# Patient Record
Sex: Male | Born: 1955
Health system: Southern US, Community
[De-identification: ages and names within clinical notes are randomized; demographics above are authoritative.]

## PROBLEM LIST (undated history)

## (undated) DIAGNOSIS — H5440 Blindness, one eye, unspecified eye: Secondary | ICD-10-CM

## (undated) DIAGNOSIS — I10 Essential (primary) hypertension: Secondary | ICD-10-CM

## (undated) DIAGNOSIS — E785 Hyperlipidemia, unspecified: Secondary | ICD-10-CM

## (undated) DIAGNOSIS — I341 Nonrheumatic mitral (valve) prolapse: Secondary | ICD-10-CM

## (undated) DIAGNOSIS — M1990 Unspecified osteoarthritis, unspecified site: Secondary | ICD-10-CM

## (undated) DIAGNOSIS — M545 Low back pain: Secondary | ICD-10-CM

## (undated) DIAGNOSIS — K635 Polyp of colon: Secondary | ICD-10-CM

## (undated) DIAGNOSIS — F419 Anxiety disorder, unspecified: Secondary | ICD-10-CM

## (undated) DIAGNOSIS — M8700 Idiopathic aseptic necrosis of unspecified bone: Secondary | ICD-10-CM

## (undated) HISTORY — DX: Anxiety disorder, unspecified: F41.9

## (undated) HISTORY — DX: Blindness, one eye, unspecified eye: H54.40

## (undated) HISTORY — PX: HERNIA REPAIR: SHX51

## (undated) HISTORY — DX: Polyp of colon: K63.5

## (undated) HISTORY — PX: SPINE SURGERY: SHX786

## (undated) HISTORY — DX: Low back pain: M54.5

## (undated) HISTORY — DX: Unspecified osteoarthritis, unspecified site: M19.90

---

## 1997-12-06 ENCOUNTER — Ambulatory Visit (HOSPITAL_COMMUNITY): Admission: RE | Admit: 1997-12-06 | Discharge: 1997-12-06 | Payer: Self-pay | Admitting: Specialist

## 1997-12-15 ENCOUNTER — Encounter: Admission: RE | Admit: 1997-12-15 | Discharge: 1998-03-15 | Payer: Self-pay | Admitting: Anesthesiology

## 1998-05-30 DIAGNOSIS — M545 Low back pain, unspecified: Secondary | ICD-10-CM

## 1998-05-30 HISTORY — DX: Low back pain, unspecified: M54.50

## 1998-09-14 ENCOUNTER — Ambulatory Visit (HOSPITAL_COMMUNITY): Admission: RE | Admit: 1998-09-14 | Discharge: 1998-09-14 | Payer: Self-pay | Admitting: Specialist

## 1998-09-14 ENCOUNTER — Encounter: Payer: Self-pay | Admitting: Specialist

## 1998-10-08 ENCOUNTER — Ambulatory Visit (HOSPITAL_COMMUNITY): Admission: RE | Admit: 1998-10-08 | Discharge: 1998-10-08 | Payer: Self-pay | Admitting: Orthopedic Surgery

## 1998-10-08 ENCOUNTER — Encounter: Payer: Self-pay | Admitting: Orthopedic Surgery

## 1998-11-05 ENCOUNTER — Observation Stay (HOSPITAL_COMMUNITY): Admission: RE | Admit: 1998-11-05 | Discharge: 1998-11-05 | Payer: Self-pay | Admitting: Specialist

## 1998-11-05 ENCOUNTER — Encounter: Payer: Self-pay | Admitting: Specialist

## 1999-11-12 ENCOUNTER — Encounter: Admission: RE | Admit: 1999-11-12 | Discharge: 1999-11-12 | Payer: Self-pay | Admitting: Internal Medicine

## 2000-01-11 ENCOUNTER — Encounter: Admission: RE | Admit: 2000-01-11 | Discharge: 2000-01-11 | Payer: Self-pay | Admitting: Internal Medicine

## 2000-01-14 ENCOUNTER — Encounter: Admission: RE | Admit: 2000-01-14 | Discharge: 2000-04-13 | Payer: Self-pay

## 2000-02-08 ENCOUNTER — Encounter: Admission: RE | Admit: 2000-02-08 | Discharge: 2000-02-08 | Payer: Self-pay | Admitting: Internal Medicine

## 2000-02-09 ENCOUNTER — Ambulatory Visit (HOSPITAL_COMMUNITY): Admission: RE | Admit: 2000-02-09 | Discharge: 2000-02-09 | Payer: Self-pay

## 2000-09-01 ENCOUNTER — Encounter: Admission: RE | Admit: 2000-09-01 | Discharge: 2000-09-01 | Payer: Self-pay | Admitting: Internal Medicine

## 2001-01-31 ENCOUNTER — Encounter: Admission: RE | Admit: 2001-01-31 | Discharge: 2001-01-31 | Payer: Self-pay | Admitting: Internal Medicine

## 2001-03-01 ENCOUNTER — Encounter: Admission: RE | Admit: 2001-03-01 | Discharge: 2001-03-01 | Payer: Self-pay | Admitting: Internal Medicine

## 2001-05-03 ENCOUNTER — Emergency Department (HOSPITAL_COMMUNITY): Admission: EM | Admit: 2001-05-03 | Discharge: 2001-05-03 | Payer: Self-pay | Admitting: Emergency Medicine

## 2001-05-03 ENCOUNTER — Encounter: Payer: Self-pay | Admitting: Emergency Medicine

## 2003-10-15 ENCOUNTER — Encounter: Admission: RE | Admit: 2003-10-15 | Discharge: 2003-10-15 | Payer: Self-pay | Admitting: Family Medicine

## 2003-12-16 ENCOUNTER — Ambulatory Visit (HOSPITAL_COMMUNITY): Admission: RE | Admit: 2003-12-16 | Discharge: 2003-12-16 | Payer: Self-pay | Admitting: Orthopedic Surgery

## 2004-04-19 ENCOUNTER — Emergency Department (HOSPITAL_COMMUNITY): Admission: EM | Admit: 2004-04-19 | Discharge: 2004-04-19 | Payer: Self-pay | Admitting: Emergency Medicine

## 2004-05-02 ENCOUNTER — Ambulatory Visit (HOSPITAL_COMMUNITY): Admission: RE | Admit: 2004-05-02 | Discharge: 2004-05-02 | Payer: Self-pay | Admitting: *Deleted

## 2004-09-06 ENCOUNTER — Ambulatory Visit: Payer: Self-pay | Admitting: Internal Medicine

## 2005-01-12 ENCOUNTER — Ambulatory Visit: Payer: Self-pay | Admitting: Internal Medicine

## 2005-01-15 ENCOUNTER — Encounter: Admission: RE | Admit: 2005-01-15 | Discharge: 2005-01-15 | Payer: Self-pay | Admitting: Internal Medicine

## 2005-04-13 ENCOUNTER — Ambulatory Visit: Payer: Self-pay | Admitting: Internal Medicine

## 2005-05-30 HISTORY — PX: EYE SURGERY: SHX253

## 2008-02-14 ENCOUNTER — Ambulatory Visit: Payer: Self-pay | Admitting: Internal Medicine

## 2008-02-14 DIAGNOSIS — M48061 Spinal stenosis, lumbar region without neurogenic claudication: Secondary | ICD-10-CM | POA: Insufficient documentation

## 2008-02-14 DIAGNOSIS — F17200 Nicotine dependence, unspecified, uncomplicated: Secondary | ICD-10-CM | POA: Insufficient documentation

## 2008-02-28 ENCOUNTER — Ambulatory Visit: Payer: Self-pay | Admitting: Internal Medicine

## 2008-02-28 DIAGNOSIS — L738 Other specified follicular disorders: Secondary | ICD-10-CM | POA: Insufficient documentation

## 2008-03-17 ENCOUNTER — Ambulatory Visit: Payer: Self-pay | Admitting: Internal Medicine

## 2009-10-28 ENCOUNTER — Ambulatory Visit: Payer: Self-pay | Admitting: Internal Medicine

## 2009-10-28 DIAGNOSIS — H00029 Hordeolum internum unspecified eye, unspecified eyelid: Secondary | ICD-10-CM | POA: Insufficient documentation

## 2010-05-05 ENCOUNTER — Ambulatory Visit: Payer: Self-pay | Admitting: Internal Medicine

## 2010-05-05 DIAGNOSIS — N401 Enlarged prostate with lower urinary tract symptoms: Secondary | ICD-10-CM

## 2010-05-05 DIAGNOSIS — N138 Other obstructive and reflux uropathy: Secondary | ICD-10-CM | POA: Insufficient documentation

## 2010-05-05 DIAGNOSIS — K4090 Unilateral inguinal hernia, without obstruction or gangrene, not specified as recurrent: Secondary | ICD-10-CM | POA: Insufficient documentation

## 2010-05-05 LAB — CONVERTED CEMR LAB
ALT: 19 units/L (ref 0–53)
AST: 18 units/L (ref 0–37)
Albumin: 4.6 g/dL (ref 3.5–5.2)
Alkaline Phosphatase: 63 units/L (ref 39–117)
BUN: 11 mg/dL (ref 6–23)
Basophils Absolute: 0 10*3/uL (ref 0.0–0.1)
Basophils Relative: 0.9 % (ref 0.0–3.0)
Bilirubin, Direct: 0.1 mg/dL (ref 0.0–0.3)
CO2: 29 meq/L (ref 19–32)
Calcium: 9.8 mg/dL (ref 8.4–10.5)
Chloride: 103 meq/L (ref 96–112)
Creatinine, Ser: 1.1 mg/dL (ref 0.4–1.5)
Eosinophils Absolute: 0 10*3/uL (ref 0.0–0.7)
Eosinophils Relative: 0.8 % (ref 0.0–5.0)
GFR calc non Af Amer: 89.77 mL/min (ref >60.00)
Glucose, Bld: 79 mg/dL (ref 70–99)
HCT: 46.4 % (ref 39.0–52.0)
Hemoglobin: 16 g/dL (ref 13.0–17.0)
Lymphocytes Relative: 44.5 % (ref 12.0–46.0)
Lymphs Abs: 2.1 10*3/uL (ref 0.7–4.0)
MCHC: 34.5 g/dL (ref 30.0–36.0)
MCV: 88.6 fL (ref 78.0–100.0)
Monocytes Absolute: 0.5 10*3/uL (ref 0.1–1.0)
Monocytes Relative: 10.6 % (ref 3.0–12.0)
Neutro Abs: 2 10*3/uL (ref 1.4–7.7)
Neutrophils Relative %: 43.2 % (ref 43.0–77.0)
PSA: 2.09 ng/mL (ref 0.10–4.00)
Platelets: 224 10*3/uL (ref 150.0–400.0)
Potassium: 4.1 meq/L (ref 3.5–5.1)
RBC: 5.24 M/uL (ref 4.22–5.81)
RDW: 15.1 % — ABNORMAL HIGH (ref 11.5–14.6)
Sodium: 140 meq/L (ref 135–145)
TSH: 0.85 microintl units/mL (ref 0.35–5.50)
Total Bilirubin: 0.8 mg/dL (ref 0.3–1.2)
Total Protein: 7.5 g/dL (ref 6.0–8.3)
WBC: 4.7 10*3/uL (ref 4.5–10.5)

## 2010-06-21 ENCOUNTER — Encounter: Payer: Self-pay | Admitting: Orthopedic Surgery

## 2010-06-29 ENCOUNTER — Encounter: Payer: Self-pay | Admitting: Internal Medicine

## 2010-06-29 NOTE — Assessment & Plan Note (Signed)
Summary: BACK PAIN--SOMETHING IN EYE--STC   Vital Signs:  Patient profile:   55 year old male Height:      72 inches Weight:      170 pounds BMI:     23.14 O2 Sat:      99 % on Room air Temp:     97.6 degrees F oral Pulse rate:   73 / minute Pulse rhythm:   regular Resp:     16 per minute BP sitting:   138 / 82  (left arm) Cuff size:   large  Vitals Entered By: Jonathan [name] CMA (October 28, 2009 9:00 AM)  O2 Flow:  Room air CC: back pain and cyst on L eye, Back Pain Is Patient Diabetic? No Pain Assessment Patient in pain? no        Primary Care Provider:  Etta Grandchild MD  CC:  back pain and cyst on L eye and Back Pain.  History of Present Illness: He returns c/o a painless nodule in left upper eyelid for 2 weeks.   Back Pain History:      The patient's back pain started approximately 11/13/1997.  The pain is located in the lower back region and does not radiate below the knees.  He states this is not work related.  On a scale of 1-10, he describes the pain as a 3.  He states that he has had a prior history of back pain.  The patient has not had any recent physical therapy for his back pain.  The following makes the back pain better: rest, muscle relaxers.  The following makes the back pain worse: bending.    Critical Exclusionary Diagnosis Criteria (CEDC) for Back Pain:      The patient denies a history of previous trauma.  He notes a prior history of spinal surgery.  There are no symptoms to suggest infection, cauda equina, or psychosocial factors for back pain.  Cancer risk factors include age >50 yrs with new back pain.     Preventive Screening-Counseling & Management  Alcohol-Tobacco     Alcohol drinks/day: 0     Smoking Status: current     Smoking Cessation Counseling: yes     Smoke Cessation Stage: precontemplative     Packs/Day: 0.5  Hep-HIV-STD-Contraception     Hepatitis Risk: no risk noted     HIV Risk: no risk noted     STD Risk: no risk noted     Sexual History:  currently monogamous.        Drug Use:  no.        Blood Transfusions:  no.    Medications Prior to Update: 1)  Flexeril 10 Mg Tabs (Cyclobenzaprine Hcl) .... Three Times A Day As Needed 2)  Tramadol Hcl 50 Mg Tabs (Tramadol Hcl) .... Three Times A Day As Needed 3)  Ibuprofen 800 Mg Tabs (Ibuprofen) .... One By Mouth Three Times A Day As Needed Lbp  Current Medications (verified): 1)  Tobradex 0.3-0.1 % Oint (Tobramycin-Dexamethasone) .... Instill A Thin Ribbon Under Left Upper Eyelid Three Times A Day For 10 Days 2)  Flexeril 10 Mg Tabs (Cyclobenzaprine Hcl) .... One By Mouth Three Times A Day As Needed For Low Back Pain  Allergies: 1)  ! Cortisone  Past History:  Past Medical History: Reviewed history from 02/14/2008 and no changes required. Low back pain  Past Surgical History: Reviewed history from 03/22/2007 and no changes required. back surgery--L4-5 disectomy  2000  Family History: Reviewed  history from 02/14/2008 and no changes required. Family History of Arthritis Family History Hypertension Family History of Stroke F 1st degree relative <60 Family History of Stroke M 1st degree relative <50  Social History: Reviewed history from 02/14/2008 and no changes required. Current Smoker Alcohol use-no Drug use-no Regular exercise-yes disabled Married Packs/Day:  0.5 Hepatitis Risk:  no risk noted HIV Risk:  no risk noted STD Risk:  no risk noted Sexual History:  currently monogamous Blood Transfusions:  no  Review of Systems General:  Denies chills, fatigue, fever, loss of appetite, malaise, and sweats. Eyes:  Complains of discharge; denies blurring, double vision, eye irritation, eye pain, halos, itching, light sensitivity, red eye, vision loss-1 eye, and vision loss-both eyes.  Physical Exam  General:  alert, well-developed, well-nourished, well-hydrated, appropriate dress, normal appearance, healthy-appearing, and cooperative to  examination.   Eyes:  he has an internal hordeolum in the left upper eyelid.  vision grossly intact, pupils equal, pupils round, pupils reactive to light, pupils react to accomodation, corneas and lenses clear, no injection, and no nystagmus.   Ears:  R ear normal and L ear normal.   Mouth:  Oral mucosa and oropharynx without lesions or exudates.  Teeth in good repair. Neck:  supple, full ROM, and no masses.   Chest Wall:  he has a lipoma on the right posterior chest wall Lungs:  Normal respiratory effort, chest expands symmetrically. Lungs are clear to auscultation, no crackles or wheezes. Heart:  Normal rate and regular rhythm. S1 and S2 normal without gallop, murmur, click, rub or other extra sounds. Abdomen:  Bowel sounds positive,abdomen soft and non-tender without masses, organomegaly or hernias noted. Msk:  normal ROM, no joint tenderness, no joint swelling, no joint warmth, no redness over joints, no joint deformities, no joint instability, no crepitation, and no muscle atrophy.   Pulses:  R and L carotid,radial,femoral,dorsalis pedis and posterior tibial pulses are full and equal bilaterally Extremities:  No clubbing, cyanosis, edema, or deformity noted with normal full range of motion of all joints.   Neurologic:  No cranial nerve deficits noted. Station and gait are normal. Plantar reflexes are down-going bilaterally. DTRs are symmetrical throughout. Sensory, motor and coordinative functions appear intact. Skin:  turgor normal, color normal, no rashes, no suspicious lesions, no ecchymoses, no petechiae, no purpura, no ulcerations, and no edema.   Cervical Nodes:  no anterior cervical adenopathy and no posterior cervical adenopathy.   Axillary Nodes:  no R axillary adenopathy and no L axillary adenopathy.   Inguinal Nodes:  no R inguinal adenopathy and no L inguinal adenopathy.   Psych:  Cognition and judgment appear intact. Alert and cooperative with normal attention span and  concentration. No apparent delusions, illusions, hallucinations  Low Back Pain Physical Exam:    Motor Exam/Strength:         Left Ankle Dorsiflexion (L5,L4):     normal       Left Great Toe Dorsiflexion (L5,L4):     normal       Left Heel Walk (L5,some L4):     normal       Left Single Squat & Rise-Quads (L4):   normal       Left Toe Walk-calf (S1):       normal       Right Ankle Dorsiflexion (L5,L4):     normal       Right Great Toe Dorsiflexion (L5,L4):       normal  Right Heel Walk (L5,some L4):     normal       Right Single Squat & Rise Quads (L4):   normal       Right Toe Walk-calf (S1):       normal    Sensory Exam/Pinprick:        Left Medial Foot (L4):   normal       Left Dorsal Foot (L5):   normal       Left Lateral Foot (S1):   normal       Right Medial Foot (L4):   normal       Right Dorsal Foot (L5):   normal       Right Lateral Foot (S1):   normal    Reflexes:        Left Knee Jerk (L4):     normal       Left Ankle Reflex (S1):   normal       Right Knee Jerk:     normal       Right Ankle Reflex (S1):   normal    Straight Leg Raise (SLR):       Left Straight Leg Raise (SLR):   negative       Right Straight Leg Raise (SLR):   negative   Impression & Recommendations:  Problem # 1:  HORDEOLUM INTERNUM (ICD-373.12) Assessment New will try warm compresses and tobradex ophth ointment for 10 days and consider Ophth. referral if I and D is needed Orders: Ophthalmology Referral (Ophthalmology)  Problem # 2:  LOW BACK PAIN (ICD-724.2) Assessment: Unchanged  The following medications were removed from the medication list:    Flexeril 10 Mg Tabs (Cyclobenzaprine hcl) .Marland Kitchen... Three times a day as needed    Tramadol Hcl 50 Mg Tabs (Tramadol hcl) .Marland Kitchen... Three times a day as needed    Ibuprofen 800 Mg Tabs (Ibuprofen) ..... One by mouth three times a day as needed lbp His updated medication list for this problem includes:    Flexeril 10 Mg Tabs (Cyclobenzaprine hcl)  ..... One by mouth three times a day as needed for low back pain  Discussed use of moist heat or ice, modified activities, medications, and stretching/strengthening exercises. Back care instructions given. To be seen in 2 weeks if no improvement; sooner if worsening of symptoms.   Orders: Tobacco use cessation intermediate 3-10 minutes (04540)  Problem # 3:  TOBACCO ABUSE (ICD-305.1) Assessment: Unchanged  Encouraged smoking cessation and discussed different methods for smoking cessation.   Orders: Tobacco use cessation intermediate 3-10 minutes (99406)  Complete Medication List: 1)  Tobradex 0.3-0.1 % Oint (Tobramycin-dexamethasone) .... Instill a thin ribbon under left upper eyelid three times a day for 10 days 2)  Flexeril 10 Mg Tabs (Cyclobenzaprine hcl) .... One by mouth three times a day as needed for low back pain  Patient Instructions: 1)  Please schedule a follow-up appointment in 2 weeks. 2)  Tobacco is very bad for your health and your loved ones! You Should stop smoking!. 3)  Stop Smoking Tips: Choose a Quit date. Cut down before the Quit date. decide what you will do as a substitute when you feel the urge to smoke(gum,toothpick,exercise). 4)  Take 650-1000mg  of Tylenol every 4-6 hours as needed for relief of pain or comfort of fever AVOID taking more than 4000mg   in a 24 hour period (can cause liver damage in higher doses). 5)  Take 400-600mg  of Ibuprofen (Advil, Motrin) with food every 4-6 hours as  needed for relief of pain or comfort of fever. 6)  Most patients (90%) with low back pain will improve with time (2-6 weeks). Keep active but avoid activities that are painful. Apply moist heat and/or ice to lower back several times a day. Prescriptions: FLEXERIL 10 MG TABS (CYCLOBENZAPRINE HCL) One by mouth three times a day as needed for low back pain  #50 x 11   Entered and Authorized by:   Jonathan Grandchild MD   Signed by:   Jonathan Grandchild MD on 10/28/2009   Method used:    Print then Give to Patient   RxID:   [id number] TOBRADEX 0.3-0.1 % OINT (TOBRAMYCIN-DEXAMETHASONE) Instill a thin ribbon under left upper eyelid three times a day for 10 days  #1 tube x 0   Entered and Authorized by:   Jonathan Grandchild MD   Signed by:   Jonathan Grandchild MD on 10/28/2009   Method used:   Print then Give to Patient   RxID:   [id number]   Preventive Care Screening  Last Tetanus Booster:    Date:  05/31/2007    Results:  Historical     Not Administered:    Influenza Vaccine not given due to: declined

## 2010-06-29 NOTE — Assessment & Plan Note (Signed)
Summary: 2 wk fu/$50/pn   Vital Signs:  Patient Profile:   55 Years Old Male Weight:      164 pounds O2 Sat:      98 % Temp:     98.2 degrees F oral Pulse rate:   60 / minute Pulse rhythm:   regular BP sitting:   120 / 82  (left arm) Cuff size:   regular  Vitals Entered By: Rock Nephew CMA (March 17, 2008 11:10 AM)                 Chief Complaint:  2wk follow up/pain med not working.  History of Present Illness: Pt. c/o LBP for one year with some increase in pain over the last few months. No recent trauma or injury. He had normal plain films done at a hospital in [facility]. within the last year. He had discectomy done by Dr. Montez Morita at [facility] in 2000. The pain radiates into both legs. Currrent meds are not eliminating the pain. He doesn't take ibuprofen b/c otc doses are too expensive.  infected follicle on left chest has healed. there is no more pain, swelling,  or drainage.  Back Pain History:      The patient's back pain started approximately 03/08/2007.  The pain is located in the lower back region and does not radiate below the knees.  He states this is work related.  He states that he has had a prior history of back pain.  The patient has not had any recent physical therapy for his back pain.  The following makes the back pain better: activity.  The following makes the back pain worse: sitting.    Critical Exclusionary Diagnosis Criteria (CEDC) for Back Pain:      There are no symptoms to suggest infection, cauda equina, or psychosocial factors for back pain.  Cancer risk factors include no improvement in low back pain after 4-6 weeks therapy.  Other positive CEDC factors include "failed back syndrome".       Prior Medication List:  FLEXERIL 10 MG TABS (CYCLOBENZAPRINE HCL) three times a day as needed TRAMADOL HCL 50 MG TABS (TRAMADOL HCL) three times a day as needed DOXYCYCLINE HYCLATE 100 MG CPEP (DOXYCYCLINE HYCLATE) by mouth two times a day for 7  days   Updated Prior Medication List: FLEXERIL 10 MG TABS (CYCLOBENZAPRINE HCL) three times a day as needed TRAMADOL HCL 50 MG TABS (TRAMADOL HCL) three times a day as needed  Current Allergies (reviewed today): ! * ? STEROID  Past Medical History:    Reviewed history from 02/14/2008 and no changes required:       Low back pain  Past Surgical History:    Reviewed history from 03/22/2007 and no changes required:       back surgery--L4-5 disectomy  2000   Family History:    Reviewed history from 02/14/2008 and no changes required:       Family History of Arthritis       Family History Hypertension       Family History of Stroke F 1st degree relative <60       Family History of Stroke M 1st degree relative <50  Social History:    Reviewed history from 02/14/2008 and no changes required:       Divorced       Current Smoker       Alcohol use-yes       Drug use-no       Regular exercise-yes  disabled   Risk Factors:     Counseled to quit/cut down tobacco use:  yes Passive smoke exposure:  no HIV high-risk behavior:  no Alcohol use:  no  Family History Risk Factors:    Family History of MI in females < 40 years old:  no    Family History of MI in males < 73 years old:  no   Review of Systems       The patient complains of weight gain.  The patient denies anorexia, fever, weight loss, chest pain, syncope, dyspnea on exertion, peripheral edema, prolonged cough, headaches, hemoptysis, abdominal pain, melena, hematochezia, severe indigestion/heartburn, hematuria, incontinence, difficulty walking, depression, and testicular masses.    MS      Complains of low back pain and stiffness.      Denies joint pain, joint redness, joint swelling, loss of strength, mid back pain, muscle aches, muscle, cramps, muscle weakness, and thoracic pain.  Neuro      Complains of numbness.      Denies brief paralysis, difficulty with concentration, disturbances in coordination, falling  down, headaches, inability to speak, memory loss, poor balance, seizures, sensation of room spinning, tingling, tremors, visual disturbances, and weakness.   Physical Exam  General:     alert, well-developed, well-nourished, well-hydrated, and underweight appearing.   Neck:     supple, full ROM, no masses, no thyromegaly, no thyroid nodules or tenderness, and no JVD.   Chest Wall:     the area of previous folliculitis has healed with no evidence of infetion. The only noticeable finding now is a very small area of PIPA. Lungs:     Normal respiratory effort, chest expands symmetrically. Lungs are clear to auscultation, no crackles or wheezes. Heart:     Normal rate and regular rhythm. S1 and S2 normal without gallop, murmur, click, rub or other extra sounds. Abdomen:     soft, non-tender, normal bowel sounds, no hepatomegaly, and no splenomegaly.   Msk:     normal ROM, no joint tenderness, and no joint swelling.   Neurologic:     alert & oriented X3, cranial nerves II-XII intact, strength normal in all extremities, sensation intact to light touch, gait normal, and LLE hyporeflexia.   Skin:     turgor normal, color normal, and no rashes.   Psych:     Oriented X3, memory intact for recent and remote, normally interactive, good eye contact, not anxious appearing, not depressed appearing, and not agitated.    Low Back Pain Physical Exam:    Inspection-deformity:     No    Palpation-spinal tenderness:   No    Motor Exam/Strength:         Left Ankle Dorsiflexion (L5,L4):     normal       Left Great Toe Dorsiflexion (L5,L4):     normal       Left Heel Walk (L5,some L4):     normal       Left Single Squat & Rise-Quads (L4):   normal       Left Toe Walk-calf (S1):       normal       Right Ankle Dorsiflexion (L5,L4):     normal       Right Great Toe Dorsiflexion (L5,L4):       normal       Right Heel Walk (L5,some L4):     normal       Right Single Squat & Rise Quads (L4):   normal  Right Toe Walk-calf (S1):       normal    Sensory Exam/Pinprick:        Left Medial Foot (L4):   normal       Left Dorsal Foot (L5):   normal       Left Lateral Foot (S1):   normal       Right Medial Foot (L4):   normal       Right Dorsal Foot (L5):   normal       Right Lateral Foot (S1):   normal    Reflexes:        Left Knee Jerk (L4):     decreased       Left Ankle Reflex (S1):   decreased       Right Knee Jerk:     normal       Right Ankle Reflex (S1):   normal    Straight Leg Raise (SLR):       Left Straight Leg Raise (SLR):   negative       Right Straight Leg Raise (SLR):   negative   Detailed Back/Spine Exam  Lumbosacral Exam:  Inspection-deformity:    Abnormal    positive for scar Palpation-spinal tenderness:  Normal Range of Motion:    Forward Flexion:   90 degrees    Hyperextension:   35 degrees    Right Lateral Bend:   35 degrees    Left Lateral Bend:   30 degrees Lying Straight Leg Raise:    Right:  negative    Left:  negative Sitting Straight Leg Raise:    Right:  negative    Left:  negative Reverse Straight Leg Raise:    Right:  negative    Left:  negative Contralateral Straight Leg Raise:    Right:  negative    Left:  negative Sciatic Notch:    There is no sciatic notch tenderness. Toe Walking:    Right:  normal    Left:  normal Heel Walking:    Right:  normal    Left:  normal    Impression & Recommendations:  Problem # 1:  LOW BACK PAIN (ICD-724.2) Assessment: Deteriorated ortho referral to dr. Shon Baton at [facility]. His updated medication list for this problem includes:    Flexeril 10 Mg Tabs (Cyclobenzaprine hcl) .Marland Kitchen... Three times a day as needed    Tramadol Hcl 50 Mg Tabs (Tramadol hcl) .Marland Kitchen... Three times a day as needed    Ibuprofen 800 Mg Tabs (Ibuprofen) ..... One by mouth three times a day as needed lbp   Problem # 2:  FOLLICULITIS (ICD-704.8) Assessment: Improved  Complete Medication List: 1)  Flexeril 10 Mg Tabs  (Cyclobenzaprine hcl) .... Three times a day as needed 2)  Tramadol Hcl 50 Mg Tabs (Tramadol hcl) .... Three times a day as needed 3)  Ibuprofen 800 Mg Tabs (Ibuprofen) .... One by mouth three times a day as needed lbp   Patient Instructions: 1)  Please schedule an appointment with Dr. Shon Baton at [facility] [phone] 2)  Please schedule a follow-up appointment in 1 month. 3)  Tobacco is very bad for your health and your loved ones! You Should stop smoking!. 4)  Stop Smoking Tips: Choose a Quit date. Cut down before the Quit date. decide what you will do as a substitute when you feel the urge to smoke(gum,toothpick,exercise). 5)  It is important that you exercise regularly at least 20 minutes 5 times a week. If you develop  chest pain, have severe difficulty breathing, or feel very tired , stop exercising immediately and seek medical attention.   Prescriptions: IBUPROFEN 800 MG TABS (IBUPROFEN) one by mouth three times a day as needed LBP  #90 x 2   Entered and Authorized by:   Etta Grandchild MD   Signed by:   Etta Grandchild MD on 03/17/2008   Method used:   Print then Give to Patient   RxID:   [id number]  ]  Appended Document: 2 wk fu/$50/pn As billing provider, I have reviewed this document.

## 2010-06-29 NOTE — Assessment & Plan Note (Signed)
Summary: f/u and ?hernia/lb   Vital Signs:  Patient profile:   55 year old male Height:      72 inches Weight:      170 pounds BMI:     23.14 O2 Sat:      97 % on Room air Temp:     97.9 degrees F oral Pulse rate:   56 / minute Pulse rhythm:   regular Resp:     16 per minute BP sitting:   134 / 72  (left arm) Cuff size:   large  Vitals Entered By: Rock Nephew CMA (May 05, 2010 1:57 PM)  O2 Flow:  Room air CC: follow-up visit// pt c/o continued back pain and knot on hernia Is Patient Diabetic? No Pain Assessment Patient in pain? yes     Location: back  Does patient need assistance? Functional Status Self care Ambulation Normal   Primary Care Provider:  Etta Grandchild MD  CC:  follow-up visit// pt c/o continued back pain and knot on hernia.  History of Present Illness: He returns c/o a bulge in his right groin for several months.  His low back pain has not worsened or changed and he tells me that he is seeing Dr. Shon Baton for intermittent ESI and therapy.  Preventive Screening-Counseling & Management  Alcohol-Tobacco     Alcohol drinks/day: 0     Alcohol Counseling: not indicated; patient does not drink     Smoking Status: current     Smoking Cessation Counseling: yes     Smoke Cessation Stage: precontemplative     Packs/Day: 0.5     Passive Smoke Exposure: no     Tobacco Counseling: to quit use of tobacco products  Hep-HIV-STD-Contraception     Hepatitis Risk: no risk noted     HIV Risk: no risk noted     STD Risk: no risk noted      Sexual History:  currently monogamous.        Drug Use:  no.        Blood Transfusions:  no.    Medications Prior to Update: 1)  Tobradex 0.3-0.1 % Oint (Tobramycin-Dexamethasone) .... Instill A Thin Ribbon Under Left Upper Eyelid Three Times A Day For 10 Days 2)  Flexeril 10 Mg Tabs (Cyclobenzaprine Hcl) .... One By Mouth Three Times A Day As Needed For Low Back Pain  Current Medications (verified): 1)   None  Allergies (verified): 1)  ! Cortisone  Past History:  Past Medical History: Last updated: 02/14/2008 Low back pain  Past Surgical History: Last updated: 03/22/2007 back surgery--L4-5 disectomy  2000  Family History: Last updated: 02/14/2008 Family History of Arthritis Family History Hypertension Family History of Stroke F 1st degree relative <60 Family History of Stroke M 1st degree relative <50  Social History: Last updated: 10/28/2009 Current Smoker Alcohol use-no Drug use-no Regular exercise-yes disabled Married  Risk Factors: Alcohol Use: 0 (05/05/2010) Exercise: yes (02/14/2008)  Risk Factors: Smoking Status: current (05/05/2010) Packs/Day: 0.5 (05/05/2010) Passive Smoke Exposure: no (05/05/2010)  Family History: Reviewed history from 02/14/2008 and no changes required. Family History of Arthritis Family History Hypertension Family History of Stroke F 1st degree relative <60 Family History of Stroke M 1st degree relative <50  Social History: Reviewed history from 10/28/2009 and no changes required. Current Smoker Alcohol use-no Drug use-no Regular exercise-yes disabled Married  Review of Systems  The patient denies anorexia, fever, weight loss, weight gain, chest pain, syncope, dyspnea on exertion, peripheral edema, prolonged cough,  headaches, hemoptysis, abdominal pain, melena, hematochezia, severe indigestion/heartburn, hematuria, genital sores, suspicious skin lesions, enlarged lymph nodes, angioedema, and testicular masses.   GU:  Denies decreased libido, discharge, dysuria, erectile dysfunction, genital sores, hematuria, incontinence, nocturia, urinary frequency, and urinary hesitancy. MS:  Complains of low back pain; denies joint pain, joint redness, joint swelling, loss of strength, and muscle aches. Neuro:  Denies disturbances in coordination, falling down, numbness, poor balance, tingling, and weakness.  Physical Exam  General:   alert, well-developed, well-nourished, well-hydrated, appropriate dress, normal appearance, healthy-appearing, and cooperative to examination.   Head:  normocephalic, atraumatic, no abnormalities observed, and no abnormalities palpated.   Eyes:  No corneal or conjunctival inflammation noted. EOMI. Perrla. Funduscopic exam benign, without hemorrhages, exudates or papilledema. Vision grossly normal. Mouth:  Oral mucosa and oropharynx without lesions or exudates.  Teeth in good repair. Neck:  supple, full ROM, and no masses.   Lungs:  Normal respiratory effort, chest expands symmetrically. Lungs are clear to auscultation, no crackles or wheezes. Heart:  Normal rate and regular rhythm. S1 and S2 normal without gallop, murmur, click, rub or other extra sounds. Abdomen:  soft, non-tender, normal bowel sounds, no distention, no masses, no guarding, no rigidity, no rebound tenderness, no abdominal hernia, no hepatomegaly, and no splenomegaly.  he has a right direct inguinal hernia that protrudes with valsalva then spontaneously reduces. Rectal:  No external abnormalities noted. Normal sphincter tone. No rectal masses or tenderness. Genitalia:  uncircumcised, no hydrocele, no varicocele, no scrotal masses, no testicular masses or atrophy, no cutaneous lesions, and no urethral discharge.   Prostate:  no nodules, no asymmetry, no induration, and 1+ enlarged.   Msk:  No deformity or scoliosis noted of thoracic or lumbar spine.   Pulses:  R and L carotid,radial,femoral,dorsalis pedis and posterior tibial pulses are full and equal bilaterally Extremities:  No clubbing, cyanosis, edema, or deformity noted with normal full range of motion of all joints.   Neurologic:  No cranial nerve deficits noted. Station and gait are normal. Plantar reflexes are down-going bilaterally. DTRs are symmetrical throughout. Sensory, motor and coordinative functions appear intact. Skin:  Intact without suspicious lesions or  rashes Cervical Nodes:  no anterior cervical adenopathy and no posterior cervical adenopathy.   Axillary Nodes:  no R axillary adenopathy and no L axillary adenopathy.   Inguinal Nodes:  no R inguinal adenopathy and no L inguinal adenopathy.   Psych:  Cognition and judgment appear intact. Alert and cooperative with normal attention span and concentration. No apparent delusions, illusions, hallucinations   Impression & Recommendations:  Problem # 1:  ING HERN W/O MENTION OBST/GANGREN UNILAT/UNSPEC (ICD-550.90) Assessment New  Orders: Venipuncture (09381) TLB-BMP (Basic Metabolic Panel-BMET) (80048-METABOL) TLB-CBC Platelet - w/Differential (85025-CBCD) TLB-Hepatic/Liver Function Pnl (80076-HEPATIC) TLB-TSH (Thyroid Stimulating Hormone) (84443-TSH) TLB-PSA (Prostate Specific Antigen) (84153-PSA) Surgical Referral (Surgery)  Problem # 2:  HYPERTROPHY PROSTATE W/UR OBST & OTH LUTS (ICD-600.01) Assessment: New  Orders: Venipuncture (82993) TLB-BMP (Basic Metabolic Panel-BMET) (80048-METABOL) TLB-CBC Platelet - w/Differential (85025-CBCD) TLB-Hepatic/Liver Function Pnl (80076-HEPATIC) TLB-TSH (Thyroid Stimulating Hormone) (84443-TSH) TLB-PSA (Prostate Specific Antigen) (84153-PSA)  Problem # 3:  LOW BACK PAIN (ICD-724.2) Assessment: Unchanged  The following medications were removed from the medication list:    Flexeril 10 Mg Tabs (Cyclobenzaprine hcl) ..... One by mouth three times a day as needed for low back pain  Orders: Venipuncture (71696) TLB-BMP (Basic Metabolic Panel-BMET) (80048-METABOL) TLB-CBC Platelet - w/Differential (85025-CBCD) TLB-Hepatic/Liver Function Pnl (80076-HEPATIC) TLB-TSH (Thyroid Stimulating Hormone) (84443-TSH) TLB-PSA (Prostate Specific Antigen) (  84153-PSA)  Colorectal Screening:  Current Recommendations:    Hemoccult: NEG X 1 today    Colonoscopy recommended: patient defers today but will consider in the future  PSA Screening:    Reviewed  PSA screening recommendations: PSA ordered  Immunization & Chemoprophylaxis:    Tetanus vaccine: Historical  (05/31/2007)  Patient Instructions: 1)  Please schedule a follow-up appointment in 1 month. 2)  Tobacco is very bad for your health and your loved ones! You Should stop smoking!. 3)  Stop Smoking Tips: Choose a Quit date. Cut down before the Quit date. decide what you will do as a substitute when you feel the urge to smoke(gum,toothpick,exercise). 4)  Take 650-1000mg  of Tylenol every 4-6 hours as needed for relief of pain or comfort of fever AVOID taking more than 4000mg   in a 24 hour period (can cause liver damage in higher doses).   Orders Added: 1)  Venipuncture [36415] 2)  TLB-BMP (Basic Metabolic Panel-BMET) [80048-METABOL] 3)  TLB-CBC Platelet - w/Differential [85025-CBCD] 4)  TLB-Hepatic/Liver Function Pnl [80076-HEPATIC] 5)  TLB-TSH (Thyroid Stimulating Hormone) [84443-TSH] 6)  TLB-PSA (Prostate Specific Antigen) [52778-EUM] 7)  Surgical Referral [Surgery] 8)  Est. Patient Level IV [35361]   Not Administered:    Influenza Vaccine not given due to: declined

## 2010-07-27 NOTE — Letter (Signed)
Summary: Jimmye Norman III MD  Jimmye Norman III MD   Imported By: Lester Pulaski 07/22/2010 10:57:23  _____________________________________________________________________  External Attachment:    Type:   Image     Comment:   External Document

## 2010-07-29 ENCOUNTER — Encounter (HOSPITAL_BASED_OUTPATIENT_CLINIC_OR_DEPARTMENT_OTHER)
Admission: RE | Admit: 2010-07-29 | Discharge: 2010-07-29 | Disposition: A | Discharge status: Home / Self Care | Payer: Medicaid Other | Source: Ambulatory Visit | Attending: General Surgery | Admitting: General Surgery

## 2010-07-29 DIAGNOSIS — Z01810 Encounter for preprocedural cardiovascular examination: Secondary | ICD-10-CM | POA: Insufficient documentation

## 2010-07-29 DIAGNOSIS — Z01812 Encounter for preprocedural laboratory examination: Secondary | ICD-10-CM | POA: Insufficient documentation

## 2010-07-29 LAB — BASIC METABOLIC PANEL
BUN: 9 mg/dL (ref 6–23)
CO2: 28 mEq/L (ref 19–32)
Calcium: 9.1 mg/dL (ref 8.4–10.5)
Chloride: 103 mEq/L (ref 96–112)
Creatinine, Ser: 0.97 mg/dL (ref 0.4–1.5)
GFR calc Af Amer: >60 mL/min (ref >60)
GFR calc non Af Amer: >60 mL/min (ref >60)
Glucose, Bld: 71 mg/dL (ref 70–99)
Potassium: 4.1 mEq/L (ref 3.5–5.1)
Sodium: 139 mEq/L (ref 135–145)

## 2010-07-30 ENCOUNTER — Ambulatory Visit (HOSPITAL_BASED_OUTPATIENT_CLINIC_OR_DEPARTMENT_OTHER)
Admission: RE | Admit: 2010-07-30 | Discharge: 2010-07-30 | Disposition: A | Discharge status: Home / Self Care | Payer: PRIVATE HEALTH INSURANCE | Source: Ambulatory Visit | Attending: General Surgery | Admitting: General Surgery

## 2010-07-30 DIAGNOSIS — Z01812 Encounter for preprocedural laboratory examination: Secondary | ICD-10-CM | POA: Insufficient documentation

## 2010-07-30 DIAGNOSIS — K4090 Unilateral inguinal hernia, without obstruction or gangrene, not specified as recurrent: Secondary | ICD-10-CM | POA: Insufficient documentation

## 2010-07-30 LAB — CBC
HCT: 43.2 % (ref 39.0–52.0)
Hemoglobin: 15 g/dL (ref 13.0–17.0)
MCH: 29.2 pg (ref 26.0–34.0)
MCHC: 34.7 g/dL (ref 30.0–36.0)
MCV: 84.2 fL (ref 78.0–100.0)
Platelets: 211 10*3/uL (ref 150–400)
RBC: 5.13 MIL/uL (ref 4.22–5.81)
RDW: 13.4 % (ref 11.5–15.5)
WBC: 3.9 10*3/uL — ABNORMAL LOW (ref 4.0–10.5)

## 2010-07-30 LAB — DIFFERENTIAL
Basophils Absolute: 0 10*3/uL (ref 0.0–0.1)
Basophils Relative: 1 % (ref 0–1)
Eosinophils Absolute: 0.1 10*3/uL (ref 0.0–0.7)
Eosinophils Relative: 2 % (ref 0–5)
Lymphocytes Relative: 54 % — ABNORMAL HIGH (ref 12–46)
Lymphs Abs: 2.1 10*3/uL (ref 0.7–4.0)
Monocytes Absolute: 0.4 10*3/uL (ref 0.1–1.0)
Monocytes Relative: 11 % (ref 3–12)
Neutro Abs: 1.3 10*3/uL — ABNORMAL LOW (ref 1.7–7.7)
Neutrophils Relative %: 32 % — ABNORMAL LOW (ref 43–77)

## 2010-07-30 LAB — POCT HEMOGLOBIN-HEMACUE: Hemoglobin: 14.9 g/dL (ref 13.0–17.0)

## 2010-08-05 NOTE — Op Note (Signed)
NAMEHAL, NORRINGTON NO.:  [id number]  MEDICAL RECORD NO.:  [id number]           PATIENT TYPE:  LOCATION:                                 FACILITY:  PHYSICIAN:  Cherylynn Ridges, M.D.         DATE OF BIRTH:  DATE OF PROCEDURE:  07/30/2010 DATE OF DISCHARGE:                              OPERATIVE REPORT   PREOPERATIVE DIAGNOSIS:  Right inguinal hernia.  POSTOPERATIVE DIAGNOSIS:  Sliding indirect right inguinal hernia  PROCEDURE:  Right inguinal hernia repair with mesh.  SURGEON:  Cherylynn Ridges, MD  ANESTHESIA:  General with laryngeal airway.  ESTIMATED BLOOD LOSS:  Less than 20 mL.  COMPLICATIONS:  None.  CONDITION:  Stable.  FINDINGS:  The patient had a sliding indirect right inguinal hernia along with a lipoma of the cord.  INDICATIONS FOR OPERATION:  The patient is a 55 year old gentleman otherwise healthy with a newly diagnosed symptomatic right inguinal hernia who comes in now for repair.  OPERATION:  The patient was taken to the operating room, placed on table in supine position.  After an adequate general endotracheal anesthetic was administered, a proper time-out was performed identifying the patient and the procedure to be done on the correct side, then was prepped and draped in usual sterile manner exposing the right inguinal area.  A transverse curvilinear incision about 6-cm long was made using #10 blade taken down to subcutaneous tissue.  We then used electrocautery to dissect down to the level of the external oblique fascia.  With the Lahey retractors in place, we split the external oblique fascia along its fibers down through the superficial ring.  We then dissected out the spermatic cord at the pubic tubercle and immobilized using a Penrose drain.  Once this was done, we were able to place it up on a work bench underneath on top of the Penrose drain.  We dissected out lipoma of the cord and also an indirect sac which was on the  anterior medial aspect of the cord.  The lipoma was resected and ligated at its base using suture ligature of 0 Ethibond suture.  We dissected the hernia sac down to its base and then opened it and sought to have say lipomatous like an appendices epiploica sliding component which we dissected away from the wall and passed back into the peritoneal cavity.  We then ligated the hernia sac at its base using 2 suture ligatures of 0 Ethibond suture. The excess sac was removed.  We then reinforced the floor of the inguinal canal using an oval piece of mesh measuring approximately 4 x 3 cm in size, ligating attaching it to the pubic tubercle.  The conjoined tendon anteromedially and reflected portion of the inguinal ligament inferolaterally.  This was done using a running 0 Prolene suture.  The mesh had been soaked in antibiotic solution applied prior to being implanted.  Once this was done, we placed the cord back into the canal. We reapproximated the external oblique fascia on top of the cord using a running 3-0 Vicryl suture.  Care was taken not to impinge upon or entrap  the ilioinguinal nerve which did come into the canal.  We then reapproximated Scarpa fascia using interrupted 3-0 Vicryl suture.  The patient had a preoperative block of Marcaine, therefore, we did not do a postoperative injection.  We closed the skin using running subcuticular stitch of 4-0 Monocryl.  We then used Dermabond, Steri-Strips and Tegaderm used to complete the dressing.  All needle counts, sponge counts, and instrument counts were correct.     Cherylynn Ridges, M.D.   ______________________________ Cherylynn Ridges, M.D.    JOW/MEDQ  D:  07/30/2010  T:  07/31/2010  Job:  [id number]  Electronically Signed by Jimmye Norman M.D. on 08/04/2010 01:55:13 PM

## 2011-07-14 ENCOUNTER — Encounter: Payer: Self-pay | Admitting: Internal Medicine

## 2011-07-14 ENCOUNTER — Other Ambulatory Visit (INDEPENDENT_AMBULATORY_CARE_PROVIDER_SITE_OTHER): Payer: PRIVATE HEALTH INSURANCE

## 2011-07-14 ENCOUNTER — Ambulatory Visit (INDEPENDENT_AMBULATORY_CARE_PROVIDER_SITE_OTHER)
Admission: RE | Admit: 2011-07-14 | Discharge: 2011-07-14 | Disposition: A | Discharge status: Home / Self Care | Payer: PRIVATE HEALTH INSURANCE | Source: Ambulatory Visit | Attending: Internal Medicine | Admitting: Internal Medicine

## 2011-07-14 ENCOUNTER — Ambulatory Visit (INDEPENDENT_AMBULATORY_CARE_PROVIDER_SITE_OTHER): Payer: PRIVATE HEALTH INSURANCE | Admitting: Internal Medicine

## 2011-07-14 DIAGNOSIS — Z0000 Encounter for general adult medical examination without abnormal findings: Secondary | ICD-10-CM

## 2011-07-14 DIAGNOSIS — N138 Other obstructive and reflux uropathy: Secondary | ICD-10-CM

## 2011-07-14 DIAGNOSIS — R9431 Abnormal electrocardiogram [ECG] [EKG]: Secondary | ICD-10-CM

## 2011-07-14 DIAGNOSIS — Z136 Encounter for screening for cardiovascular disorders: Secondary | ICD-10-CM

## 2011-07-14 DIAGNOSIS — M545 Low back pain, unspecified: Secondary | ICD-10-CM

## 2011-07-14 DIAGNOSIS — Z79899 Other long term (current) drug therapy: Secondary | ICD-10-CM

## 2011-07-14 DIAGNOSIS — N401 Enlarged prostate with lower urinary tract symptoms: Secondary | ICD-10-CM

## 2011-07-14 DIAGNOSIS — F17200 Nicotine dependence, unspecified, uncomplicated: Secondary | ICD-10-CM

## 2011-07-14 DIAGNOSIS — Z125 Encounter for screening for malignant neoplasm of prostate: Secondary | ICD-10-CM

## 2011-07-14 LAB — CBC WITH DIFFERENTIAL/PLATELET
Basophils Absolute: 0 10*3/uL (ref 0.0–0.1)
Basophils Relative: 0.6 % (ref 0.0–3.0)
Eosinophils Absolute: 0.1 10*3/uL (ref 0.0–0.7)
Eosinophils Relative: 1.5 % (ref 0.0–5.0)
HCT: 48.2 % (ref 39.0–52.0)
Hemoglobin: 16 g/dL (ref 13.0–17.0)
Lymphocytes Relative: 47.9 % — ABNORMAL HIGH (ref 12.0–46.0)
Lymphs Abs: 1.8 10*3/uL (ref 0.7–4.0)
MCHC: 33.2 g/dL (ref 30.0–36.0)
MCV: 88 fl (ref 78.0–100.0)
Monocytes Absolute: 0.4 10*3/uL (ref 0.1–1.0)
Monocytes Relative: 10.9 % (ref 3.0–12.0)
Neutro Abs: 1.5 10*3/uL (ref 1.4–7.7)
Neutrophils Relative %: 39.1 % — ABNORMAL LOW (ref 43.0–77.0)
Platelets: 234 10*3/uL (ref 150.0–400.0)
RBC: 5.47 Mil/uL (ref 4.22–5.81)
RDW: 14.1 % (ref 11.5–14.6)
WBC: 3.8 10*3/uL — ABNORMAL LOW (ref 4.5–10.5)

## 2011-07-14 LAB — PSA: PSA: 2 ng/mL (ref 0.10–4.00)

## 2011-07-14 LAB — LDL CHOLESTEROL, DIRECT: Direct LDL: 131.6 mg/dL

## 2011-07-14 LAB — URINALYSIS, ROUTINE W REFLEX MICROSCOPIC
Bilirubin Urine: NEGATIVE
Hgb urine dipstick: NEGATIVE
Ketones, ur: NEGATIVE
Leukocytes, UA: NEGATIVE
Nitrite: NEGATIVE
Specific Gravity, Urine: 1.02 (ref 1.000–1.030)
Total Protein, Urine: NEGATIVE
Urine Glucose: NEGATIVE
Urobilinogen, UA: 0.2 (ref 0.0–1.0)
pH: 6 (ref 5.0–8.0)

## 2011-07-14 LAB — COMPREHENSIVE METABOLIC PANEL
ALT: 16 U/L (ref 0–53)
AST: 17 U/L (ref 0–37)
Albumin: 4.2 g/dL (ref 3.5–5.2)
Alkaline Phosphatase: 54 U/L (ref 39–117)
BUN: 9 mg/dL (ref 6–23)
CO2: 27 mEq/L (ref 19–32)
Calcium: 9.6 mg/dL (ref 8.4–10.5)
Chloride: 106 mEq/L (ref 96–112)
Creatinine, Ser: 0.9 mg/dL (ref 0.4–1.5)
GFR: 115.95 mL/min (ref >60.00)
Glucose, Bld: 99 mg/dL (ref 70–99)
Potassium: 5.6 mEq/L — ABNORMAL HIGH (ref 3.5–5.1)
Sodium: 138 mEq/L (ref 135–145)
Total Bilirubin: 0.8 mg/dL (ref 0.3–1.2)
Total Protein: 7 g/dL (ref 6.0–8.3)

## 2011-07-14 LAB — LIPID PANEL
Cholesterol: 208 mg/dL — ABNORMAL HIGH (ref 0–200)
HDL: 62.1 mg/dL (ref >39.00)
Total CHOL/HDL Ratio: 3
Triglycerides: 61 mg/dL (ref 0.0–149.0)
VLDL: 12.2 mg/dL (ref 0.0–40.0)

## 2011-07-14 LAB — TSH: TSH: 0.65 u[IU]/mL (ref 0.35–5.50)

## 2011-07-14 MED ORDER — CYCLOBENZAPRINE HCL 10 MG PO TABS: 10.0000 mg | ORAL_TABLET | Freq: Three times a day (TID) | ORAL | Status: DC | PRN

## 2011-07-14 MED ORDER — ALPRAZOLAM 1 MG PO TABS: 1.0000 mg | ORAL_TABLET | Freq: Three times a day (TID) | ORAL | Status: DC | PRN

## 2011-07-14 MED ORDER — HYDROCODONE-ACETAMINOPHEN 10-325 MG PO TABS: ORAL_TABLET | ORAL | Status: DC

## 2011-07-14 NOTE — Assessment & Plan Note (Signed)
PSA today, he has no s/s

## 2011-07-14 NOTE — Assessment & Plan Note (Signed)
Continue current meds, check an xray today, he may need referral pending results

## 2011-07-14 NOTE — Assessment & Plan Note (Signed)
EKG is abnormal

## 2011-07-14 NOTE — Progress Notes (Signed)
Subjective:    Patient ID: Jonathan [name], male    DOB: 1956/05/11, 56 y.o.   MRN: [id number]  Back Pain This is a chronic problem. The current episode started more than 1 year ago. The problem occurs intermittently. The problem is unchanged. The pain is present in the lumbar spine. The quality of the pain is described as aching. The pain does not radiate. The pain is at a severity of 7/10. The pain is mild. The symptoms are aggravated by bending and twisting. Stiffness is present all day. Pertinent negatives include no abdominal pain, bladder incontinence, bowel incontinence, chest pain, dysuria, fever, headaches, leg pain, numbness, paresis, paresthesias, pelvic pain, perianal numbness, tingling, weakness or weight loss. He has tried analgesics and muscle relaxant for the symptoms. The treatment provided mild relief.      Review of Systems  Constitutional: Negative for fever, chills, weight loss, diaphoresis, activity change, appetite change, fatigue and unexpected weight change.  HENT: Negative.   Eyes: Negative.   Respiratory: Negative for apnea, cough, choking, chest tightness, shortness of breath, wheezing and stridor.   Cardiovascular: Negative for chest pain, palpitations and leg swelling.  Gastrointestinal: Negative.  Negative for abdominal pain and bowel incontinence.  Genitourinary: Negative for bladder incontinence, dysuria, urgency, frequency, hematuria, flank pain, decreased urine volume, discharge, penile swelling, scrotal swelling, enuresis, difficulty urinating, genital sores, penile pain, testicular pain and pelvic pain.  Musculoskeletal: Positive for back pain. Negative for myalgias, joint swelling, arthralgias and gait problem.  Neurological: Negative for dizziness, tingling, tremors, seizures, syncope, facial asymmetry, speech difficulty, weakness, light-headedness, numbness, headaches and paresthesias.  Hematological: Negative for adenopathy. Does not bruise/bleed easily.   Psychiatric/Behavioral: Negative.        Objective:   Physical Exam  Vitals reviewed. Constitutional: He is oriented to person, place, and time. He appears well-developed and well-nourished. No distress.  HENT:  Head: Normocephalic and atraumatic.  Mouth/Throat: Oropharynx is clear and moist. No oropharyngeal exudate.  Eyes: Conjunctivae are normal. Right eye exhibits no discharge. Left eye exhibits no discharge. No scleral icterus.  Neck: Normal range of motion. Neck supple. No JVD present. No tracheal deviation present. No thyromegaly present.  Cardiovascular: Normal rate, regular rhythm, normal heart sounds and intact distal pulses.  Exam reveals no gallop and no friction rub.   No murmur heard. Pulmonary/Chest: Effort normal and breath sounds normal. No stridor. No respiratory distress. He has no wheezes. He has no rales. He exhibits no tenderness.  Abdominal: Soft. Bowel sounds are normal. He exhibits no distension and no mass. There is no tenderness. There is no rebound and no guarding. Hernia confirmed negative in the right inguinal area and confirmed negative in the left inguinal area.  Genitourinary: Rectum normal, prostate normal, testes normal and penis normal. Rectal exam shows no external hemorrhoid, no internal hemorrhoid, no fissure, no mass, no tenderness and anal tone normal. Guaiac negative stool. Prostate is not enlarged and not tender. Right testis shows no mass, no swelling and no tenderness. Right testis is descended. Left testis shows no mass, no swelling and no tenderness. Left testis is descended. Circumcised. No penile tenderness. No discharge found.  Musculoskeletal: Normal range of motion. He exhibits no edema and no tenderness.       Lumbar back: Normal. He exhibits normal range of motion, no tenderness, no bony tenderness, no swelling, no edema, no deformity, no laceration, no pain, no spasm and normal pulse.  Lymphadenopathy:    He has no cervical adenopathy.  Right: No inguinal adenopathy present.       Left: No inguinal adenopathy present.  Neurological: He is alert and oriented to person, place, and time. He has normal reflexes. He displays normal reflexes. No cranial nerve deficit. He exhibits normal muscle tone. Coordination normal.  Skin: Skin is warm and dry. No rash noted. He is not diaphoretic. No erythema. No pallor.  Psychiatric: He has a normal mood and affect. His behavior is normal. Judgment and thought content normal.     Lab Results  Component Value Date   WBC 3.9* 07/30/2010   HGB 14.9 07/30/2010   HCT 43.2 07/30/2010   PLT 211 07/30/2010   GLUCOSE 71 07/29/2010   ALT 19 05/05/2010   AST 18 05/05/2010   NA 139 07/29/2010   K 4.1 07/29/2010   CL 103 07/29/2010   CREATININE 0.97 07/29/2010   BUN 9 07/29/2010   CO2 28 07/29/2010   TSH 0.85 05/05/2010   PSA 2.09 05/05/2010       Assessment & Plan:

## 2011-07-14 NOTE — Assessment & Plan Note (Signed)
EKG shows loss of voltage in V1 and V2, this could be a normal variant and he has no s/s, I have asked him to see cardiology for further evaluation

## 2011-07-14 NOTE — Assessment & Plan Note (Signed)
He agrees to try to quit smoking, he does not want to use any Rx options though, pt ed material was given

## 2011-07-14 NOTE — Assessment & Plan Note (Signed)
Exam done, labs ordered, referred for colonoscopy, pt ed material, he refused a flu vaccine

## 2011-07-14 NOTE — Patient Instructions (Signed)
Smoking Cessation This document explains the best ways for you to quit smoking and new treatments to help. It lists new medicines that can double or triple your chances of quitting and quitting for good. It also considers ways to avoid relapses and concerns you may have about quitting, including weight gain. NICOTINE: A POWERFUL ADDICTION If you have tried to quit smoking, you know how hard it can be. It is hard because nicotine is a very addictive drug. For some people, it can be as addictive as heroin or cocaine. Usually, people make 2 or 3 tries, or more, before finally being able to quit. Each time you try to quit, you can learn about what helps and what hurts. Quitting takes hard work and a lot of effort, but you can quit smoking. QUITTING SMOKING IS ONE OF THE MOST IMPORTANT THINGS YOU WILL EVER DO.  You will live longer, feel better, and live better.   The impact on your body of quitting smoking is felt almost immediately:   Within 20 minutes, blood pressure decreases. Pulse returns to its normal level.   After 8 hours, carbon monoxide levels in the blood return to normal. Oxygen level increases.   After 24 hours, chance of heart attack starts to decrease. Breath, hair, and body stop smelling like smoke.   After 48 hours, damaged nerve endings begin to recover. Sense of taste and smell improve.   After 72 hours, the body is virtually free of nicotine. Bronchial tubes relax and breathing becomes easier.   After 2 to 12 weeks, lungs can hold more air. Exercise becomes easier and circulation improves.   Quitting will reduce your risk of having a heart attack, stroke, cancer, or lung disease:   After 1 year, the risk of coronary heart disease is cut in half.   After 5 years, the risk of stroke falls to the same as a nonsmoker.   After 10 years, the risk of lung cancer is cut in half and the risk of other cancers decreases significantly.   After 15 years, the risk of coronary heart  disease drops, usually to the level of a nonsmoker.   If you are pregnant, quitting smoking will improve your chances of having a healthy baby.   The people you live with, especially your children, will be healthier.   You will have extra money to spend on things other than cigarettes.  FIVE KEYS TO QUITTING Studies have shown that these 5 steps will help you quit smoking and quit for good. You have the best chances of quitting if you use them together: 1. Get ready.  2. Get support and encouragement.  3. Learn new skills and behaviors.  4. Get medicine to reduce your nicotine addiction and use it correctly.  5. Be prepared for relapse or difficult situations. Be determined to continue trying to quit, even if you do not succeed at first.  1. GET READY  Set a quit date.   Change your environment.   Get rid of ALL cigarettes, ashtrays, matches, and lighters in your home, car, and place of work.   Do not let people smoke in your home.   Review your past attempts to quit. Think about what worked and what did not.   Once you quit, do not smoke. NOT EVEN A PUFF!  2. GET SUPPORT AND ENCOURAGEMENT Studies have shown that you have a better chance of being successful if you have help. You can get support in many ways.  Tell   your family, friends, and coworkers that you are going to quit and need their support. Ask them not to smoke around you.   Talk to your caregivers (doctor, dentist, nurse, pharmacist, psychologist, and/or smoking counselor).   Get individual, group, or telephone counseling and support. The more counseling you have, the better your chances are of quitting. Programs are available at local hospitals and health centers. Call your local health department for information about programs in your area.   Spiritual beliefs and practices may help some smokers quit.   Quit meters are small computer programs online or downloadable that keep track of quit statistics, such as amount  of "quit-time," cigarettes not smoked, and money saved.   Many smokers find one or more of the many self-help books available useful in helping them quit and stay off tobacco.  3. LEARN NEW SKILLS AND BEHAVIORS  Try to distract yourself from urges to smoke. Talk to someone, go for a walk, or occupy your time with a task.   When you first try to quit, change your routine. Take a different route to work. Drink tea instead of coffee. Eat breakfast in a different place.   Do something to reduce your stress. Take a hot bath, exercise, or read a book.   Plan something enjoyable to do every day. Reward yourself for not smoking.   Explore interactive web-based programs that specialize in helping you quit.  4. GET MEDICINE AND USE IT CORRECTLY Medicines can help you stop smoking and decrease the urge to smoke. Combining medicine with the above behavioral methods and support can quadruple your chances of successfully quitting smoking. The [place] Food and Drug Administration (FDA) has approved 7 medicines to help you quit smoking. These medicines fall into 3 categories.  Nicotine replacement therapy (delivers nicotine to your body without the negative effects and risks of smoking):   Nicotine gum: Available over-the-counter.   Nicotine lozenges: Available over-the-counter.   Nicotine inhaler: Available by prescription.   Nicotine nasal spray: Available by prescription.   Nicotine skin patches (transdermal): Available by prescription and over-the-counter.   Antidepressant medicine (helps people abstain from smoking, but how this works is unknown):   Bupropion sustained-release (SR) tablets: Available by prescription.   Nicotinic receptor partial agonist (simulates the effect of nicotine in your brain):   Varenicline tartrate tablets: Available by prescription.   Ask your caregiver for advice about which medicines to use and how to use them. Carefully read the information on the package.    Everyone who is trying to quit may benefit from using a medicine. If you are pregnant or trying to become pregnant, nursing an infant, you are under age 18, or you smoke fewer than 10 cigarettes per day, talk to your caregiver before taking any nicotine replacement medicines.   You should stop using a nicotine replacement product and call your caregiver if you experience nausea, dizziness, weakness, vomiting, fast or irregular heartbeat, mouth problems with the lozenge or gum, or redness or swelling of the skin around the patch that does not go away.   Do not use any other product containing nicotine while using a nicotine replacement product.   Talk to your caregiver before using these products if you have diabetes, heart disease, asthma, stomach ulcers, you had a recent heart attack, you have high blood pressure that is not controlled with medicine, a history of irregular heartbeat, or you have been prescribed medicine to help you quit smoking.  5. BE PREPARED FOR RELAPSE OR   DIFFICULT SITUATIONS  Most relapses occur within the first 3 months after quitting. Do not be discouraged if you start smoking again. Remember, most people try several times before they finally quit.   You may have symptoms of withdrawal because your body is used to nicotine. You may crave cigarettes, be irritable, feel very hungry, cough often, get headaches, or have difficulty concentrating.   The withdrawal symptoms are only temporary. They are strongest when you first quit, but they will go away within 10 to 14 days.  Here are some difficult situations to watch for:  Alcohol. Avoid drinking alcohol. Drinking lowers your chances of successfully quitting.   Caffeine. Try to reduce the amount of caffeine you consume. It also lowers your chances of successfully quitting.   Other smokers. Being around smoking can make you want to smoke. Avoid smokers.   Weight gain. Many smokers will gain weight when they quit, usually  less than 10 pounds. Eat a healthy diet and stay active. Do not let weight gain distract you from your main goal, quitting smoking. Some medicines that help you quit smoking may also help delay weight gain. You can always lose the weight gained after you quit.   Bad mood or depression. There are a lot of ways to improve your mood other than smoking.  If you are having problems with any of these situations, talk to your caregiver. SPECIAL SITUATIONS AND CONDITIONS Studies suggest that everyone can quit smoking. Your situation or condition can give you a special reason to quit.  Pregnant women/new mothers: By quitting, you protect your baby's health and your own.   Hospitalized patients: By quitting, you reduce health problems and help healing.   Heart attack patients: By quitting, you reduce your risk of a second heart attack.   Lung, head, and neck cancer patients: By quitting, you reduce your chance of a second cancer.   Parents of children and adolescents: By quitting, you protect your children from illnesses caused by secondhand smoke.  QUESTIONS TO THINK ABOUT Think about the following questions before you try to stop smoking. You may want to talk about your answers with your caregiver.  Why do you want to quit?   If you tried to quit in the past, what helped and what did not?   What will be the most difficult situations for you after you quit? How will you plan to handle them?   Who can help you through the tough times? Your family? Friends? Caregiver?   What pleasures do you get from smoking? What ways can you still get pleasure if you quit?  Here are some questions to ask your caregiver:  How can you help me to be successful at quitting?   What medicine do you think would be best for me and how should I take it?   What should I do if I need more help?   What is smoking withdrawal like? How can I get information on withdrawal?  Quitting takes hard work and a lot of effort,  but you can quit smoking. FOR MORE INFORMATION  Smokefree.gov (http://www.smokefree.gov) provides free, accurate, evidence-based information and professional assistance to help support the immediate and long-term needs of people trying to quit smoking. Document Released: 05/10/2001 Document Revised: 01/26/2011 Document Reviewed: 03/02/2009 [employer] Patient Information 2012 [employer].Health Maintenance, Males A healthy lifestyle and preventative care can promote health and wellness.  Maintain regular health, dental, and eye exams.   Eat a healthy diet. Foods like vegetables, fruits,   whole grains, low-fat dairy products, and lean protein foods contain the nutrients you need without too many calories. Decrease your intake of foods high in solid fats, added sugars, and salt. Get information about a proper diet from your caregiver, if necessary.   Regular physical exercise is one of the most important things you can do for your health. Most adults should get at least 150 minutes of moderate-intensity exercise (any activity that increases your heart rate and causes you to sweat) each week. In addition, most adults need muscle-strengthening exercises on 2 or more days a week.    Maintain a healthy weight. The body mass index (BMI) is a screening tool to identify possible weight problems. It provides an estimate of body fat based on height and weight. Your caregiver can help determine your BMI, and can help you achieve or maintain a healthy weight. For adults 20 years and older:   A BMI below 18.5 is considered underweight.   A BMI of 18.5 to 24.9 is normal.   A BMI of 25 to 29.9 is considered overweight.   A BMI of 30 and above is considered obese.   Maintain normal blood lipids and cholesterol by exercising and minimizing your intake of saturated fat. Eat a balanced diet with plenty of fruits and vegetables. Blood tests for lipids and cholesterol should begin at age 20 and be repeated every 5  years. If your lipid or cholesterol levels are high, you are over 50, or you are a high risk for heart disease, you may need your cholesterol levels checked more frequently.Ongoing high lipid and cholesterol levels should be treated with medicines, if diet and exercise are not effective.   If you smoke, find out from your caregiver how to quit. If you do not use tobacco, do not start.   If you choose to drink alcohol, do not exceed 2 drinks per day. One drink is considered to be 12 ounces (355 mL) of beer, 5 ounces (148 mL) of wine, or 1.5 ounces (44 mL) of liquor.   Avoid use of street drugs. Do not share needles with anyone. Ask for help if you need support or instructions about stopping the use of drugs.   High blood pressure causes heart disease and increases the risk of stroke. Blood pressure should be checked at least every 1 to 2 years. Ongoing high blood pressure should be treated with medicines if weight loss and exercise are not effective.   If you are 45 to 58 years old, ask your caregiver if you should take aspirin to prevent heart disease.   Diabetes screening involves taking a blood sample to check your fasting blood sugar level. This should be done once every 3 years, after age 45, if you are within normal weight and without risk factors for diabetes. Testing should be considered at a younger age or be carried out more frequently if you are overweight and have at least 1 risk factor for diabetes.   Colorectal cancer can be detected and often prevented. Most routine colorectal cancer screening begins at the age of 50 and continues through age 75. However, your caregiver may recommend screening at an earlier age if you have risk factors for colon cancer. On a yearly basis, your caregiver may provide home test kits to check for hidden blood in the stool. Use of a small camera at the end of a tube, to directly examine the colon (sigmoidoscopy or colonoscopy), can detect the earliest forms  of colorectal cancer.   Talk to your caregiver about this at age 50, when routine screening begins. Direct examination of the colon should be repeated every 5 to 10 years through age 75, unless early forms of pre-cancerous polyps or small growths are found.   Healthy men should no longer receive prostate-specific antigen (PSA) blood tests as part of routine cancer screening. Consult with your caregiver about prostate cancer screening.   Practice safe sex. Use condoms and avoid high-risk sexual practices to reduce the spread of sexually transmitted infections (STIs).   Use sunscreen with a sun protection factor (SPF) of 30 or greater. Apply sunscreen liberally and repeatedly throughout the day. You should seek shade when your shadow is shorter than you. Protect yourself by wearing long sleeves, pants, a wide-brimmed hat, and sunglasses year round, whenever you are outdoors.   Notify your caregiver of new moles or changes in moles, especially if there is a change in shape or color. Also notify your caregiver if a mole is larger than the size of a pencil eraser.   A one-time screening for abdominal aortic aneurysm (AAA) and surgical repair of large AAAs by sound wave imaging (ultrasonography) is recommended for ages 65 to 75 years who are current or former smokers.   Stay current with your immunizations.  Document Released: 11/12/2007 Document Revised: 01/26/2011 Document Reviewed: 10/11/2010 [employer] Patient Information 2012 [employer]. 

## 2011-07-29 ENCOUNTER — Ambulatory Visit (INDEPENDENT_AMBULATORY_CARE_PROVIDER_SITE_OTHER): Payer: PRIVATE HEALTH INSURANCE | Admitting: Cardiology

## 2011-07-29 ENCOUNTER — Encounter: Payer: Self-pay | Admitting: Cardiology

## 2011-07-29 VITALS — BP 140/85 | HR 72 | Ht 74.0 in | Wt 177.0 lb

## 2011-07-29 DIAGNOSIS — R012 Other cardiac sounds: Secondary | ICD-10-CM | POA: Insufficient documentation

## 2011-07-29 DIAGNOSIS — R9431 Abnormal electrocardiogram [ECG] [EKG]: Secondary | ICD-10-CM

## 2011-07-29 DIAGNOSIS — I10 Essential (primary) hypertension: Secondary | ICD-10-CM

## 2011-07-29 DIAGNOSIS — F17200 Nicotine dependence, unspecified, uncomplicated: Secondary | ICD-10-CM

## 2011-07-29 NOTE — Assessment & Plan Note (Signed)
His EKG has some nonspecific findings. I doubt that he has obstructive coronary disease. However, prior to starting an exercise regimen I would like to bring him back for a treadmill test. This will be to screen for obstructive coronary disease, risk stratify and most importantly give him specific instructions on an exercise regimen.

## 2011-07-29 NOTE — Progress Notes (Addendum)
HPI The patient presents for evaluation of an abnormal EKG. He has no prior cardiac history. 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 He has had no prior cardiac testing per his memory. He has been an active person though he hasn't exercised routinely recently. He does do heavy yard work. He used to run. He plans to get back to his running regimen. With his current level of activity he denies any chest pressure, neck or arm discomfort. He is not having any palpitations, presyncope or syncope. He has had no PND or orthopnea. There has been no weight gain or edema.   Allergies  Allergen Reactions  . Cortisone     REACTION: rash, "act different"    Current Outpatient Prescriptions  Medication Sig Dispense Refill  . ALPRAZolam (XANAX) 1 MG tablet Take 1 tablet (1 mg total) by mouth 3 (three) times daily as needed for sleep (back pain).  90 tablet  3  . cyclobenzaprine (FLEXERIL) 10 MG tablet Take 10 mg by mouth 3 (three) times daily as needed.      Marland Kitchen HYDROcodone-acetaminophen (NORCO) 10-325 MG per tablet Take 1 tabs 4-6 hrs prn back pain  120 tablet  3  . cyclobenzaprine (FLEXERIL) 10 MG tablet Take 1 tablet (10 mg total) by mouth 3 (three) times daily as needed (back pain).  90 tablet  3    Past Medical History  Diagnosis Date  . Back pain, lumbosacral 2000    Past Surgical History  Procedure Date  . Spine surgery   . Hernia repair     Family History  Problem Relation Age of Onset  . Stroke Mother   . Arthritis Mother   . Stroke Father   . Hypertension Father   . Cancer Neg Hx   . COPD Neg Hx   . Heart disease Neg Hx   . Kidney disease Neg Hx     History   Social History  . Marital Status: Married    Spouse Name: N/A    Number of Children: N/A  . Years of Education: N/A   Occupational History  . Not on file.   Social History Main Topics  . Smoking status: Current Some  Day Smoker -- 0.1 packs/day for 30 years    Types: Cigarettes  . Smokeless tobacco: Never Used  . Alcohol Use: No  . Drug Use: No  . Sexually Active: Yes   Other Topics Concern  . Not on file   Social History Narrative  . No narrative on file    ROS:  Positive for leg cramping.  Otherwise as stated in the HPI and negative for all other systems.  PHYSICAL EXAM BP 140/85  Pulse 72  Ht 6\' 2"  (1.88 m)  Wt 177 lb (80.287 kg)  BMI 22.73 kg/m2 GENERAL:  Well appearing HEENT:  Pupils equal round and reactive, fundi not visualized, oral mucosa unremarkable NECK:  No jugular venous distention, waveform within normal limits, carotid upstroke brisk and symmetric, no bruits, no thyromegaly LYMPHATICS:  No cervical, inguinal adenopathy LUNGS:  Clear to auscultation bilaterally BACK:  No CVA tenderness CHEST:  Unremarkable HEART:  PMI not displaced or sustained,S1 and S2 within normal limits, no S3, no S4, positive clicks, no rubs, no murmurs ABD:  Flat, positive bowel sounds normal in frequency in pitch, no bruits, no rebound, no guarding, no midline pulsatile mass, no hepatomegaly, no splenomegaly EXT:  2 plus pulses throughout, no edema, no cyanosis no clubbing SKIN:  No rashes no nodules NEURO:  Cranial nerves II through XII grossly intact, motor grossly intact throughout PSYCH:  Cognitively intact, oriented to person place and time  EKG:  Sinus rhythm, rate 52, axis within normal limits, intervals within normal limits, no acute ST-T wave changes.  ASSESSMENT AND PLAN

## 2011-07-29 NOTE — Assessment & Plan Note (Signed)
He has a mobile mid systolic click and I suspect mitral prolapse though I don't appreciate regurgitation. I will check an echocardiogram.

## 2011-07-29 NOTE — Assessment & Plan Note (Signed)
We discussed a specific strategy for tobacco cessation.  (Greater than three minutes discussing tobacco cessation.)  

## 2011-07-29 NOTE — Patient Instructions (Signed)
Your physician has requested that you have an echocardiogram. Echocardiography is a painless test that uses sound waves to create images of your heart. It provides your doctor with information about the size and shape of your heart and how well your heart's chambers and valves are working. This procedure takes approximately one hour. There are no restrictions for this procedure.  Your physician has requested that you have an exercise tolerance test. For further information please visit [email or web address]. Please also follow instruction sheet, as given.  The current medical regimen is effective;  continue present plan and medications.  

## 2011-07-29 NOTE — Assessment & Plan Note (Signed)
I suspect his blood pressure is higher than he knows particularly with activity. We will check this at the time of his treadmill test.

## 2011-08-02 ENCOUNTER — Encounter: Payer: Self-pay | Admitting: Gastroenterology

## 2011-08-04 ENCOUNTER — Other Ambulatory Visit: Payer: Self-pay

## 2011-08-04 ENCOUNTER — Ambulatory Visit (HOSPITAL_COMMUNITY): Payer: PRIVATE HEALTH INSURANCE | Attending: Cardiology

## 2011-08-04 DIAGNOSIS — R9431 Abnormal electrocardiogram [ECG] [EKG]: Secondary | ICD-10-CM

## 2011-08-04 DIAGNOSIS — I10 Essential (primary) hypertension: Secondary | ICD-10-CM | POA: Insufficient documentation

## 2011-08-04 DIAGNOSIS — F17200 Nicotine dependence, unspecified, uncomplicated: Secondary | ICD-10-CM | POA: Insufficient documentation

## 2011-08-10 ENCOUNTER — Encounter: Payer: Self-pay | Admitting: Cardiology

## 2011-08-10 ENCOUNTER — Ambulatory Visit (INDEPENDENT_AMBULATORY_CARE_PROVIDER_SITE_OTHER): Payer: PRIVATE HEALTH INSURANCE | Admitting: Physician Assistant

## 2011-08-10 VITALS — Ht 74.5 in | Wt 173.0 lb

## 2011-08-10 DIAGNOSIS — R9431 Abnormal electrocardiogram [ECG] [EKG]: Secondary | ICD-10-CM

## 2011-08-10 DIAGNOSIS — R9439 Abnormal result of other cardiovascular function study: Secondary | ICD-10-CM

## 2011-08-10 NOTE — Progress Notes (Signed)
Exercise Treadmill Test  Pre-Exercise Testing Evaluation Rhythm: normal sinus  Rate: 62   PR:  .16 QRS:  .10  QT:  .38 QTc: .39           Test  Exercise Tolerance Test Ordering MD: Angelina Sheriff, MD  Interpreting MD:  Marca Ancona, MD  Unique Test No: 1   Treadmill:  1  Indication for ETT: Abnormal EKG  Contraindication to ETT: No   Stress Modality: exercise - treadmill  Cardiac Imaging Performed: non   Protocol: standard Bruce - maximal  Max BP:  201/89  Max MPHR (bpm):  163 85% MPR (bpm):  139  MPHR obtained (bpm):  166 % MPHR obtained:  101%  Reached 85% MPHR (min:sec):  7:08 Total Exercise Time (min-sec):  9:21  Workload in METS:  10.6 Borg Scale: 16  Reason ETT Terminated:  desired heart rate attained    ST Segment Analysis At Rest: normal ST segments - no evidence of significant ST depression With Exercise: significant ischemic ST depression  Other Information Arrhythmia:  No Angina during ETT:  absent (0) Quality of ETT:  diagnostic  ETT Interpretation:  abnormal - evidence of ST depression consistent with ischemia  Comments: Good exercise tolerance. No chest pain. Normal BP response to exercise. Approximately 2 mm ST depression in inf-lat leads.   Recommendations: Patient had good exercise tolerance with no chest pain or significant dyspnea. However, ECGs with positive changes. Will arrange ETT-Myoview to further assess for ischemic heart disease. Also, BP borderline elevated prior to test with significant increase in early exercise. He will monitor BPs and bring in record to next appt. Follow up with Dr. [name] in 4 weeks. Tereso Newcomer, PA-C  3:58 PM 08/10/2011

## 2011-08-10 NOTE — Patient Instructions (Signed)
Your physician recommends that you schedule a follow-up appointment in: 3-4 WEEKS WITH DR. [facility]  Your physician has requested that you have en exercise stress myoview DX ABNORMAL STRESS TEST AND ABNORMAL EKG. For further information please visit [email or web address] Please follow instruction sheet, as given.

## 2011-08-22 ENCOUNTER — Encounter: Payer: Self-pay | Admitting: Gastroenterology

## 2011-08-22 ENCOUNTER — Ambulatory Visit (AMBULATORY_SURGERY_CENTER): Payer: PRIVATE HEALTH INSURANCE | Admitting: *Deleted

## 2011-08-22 VITALS — Ht 74.5 in | Wt 180.0 lb

## 2011-08-22 DIAGNOSIS — Z1211 Encounter for screening for malignant neoplasm of colon: Secondary | ICD-10-CM

## 2011-08-22 MED ORDER — PEG-KCL-NACL-NASULF-NA ASC-C 100 G PO SOLR: ORAL | Status: DC

## 2011-08-23 ENCOUNTER — Ambulatory Visit (HOSPITAL_COMMUNITY): Payer: PRIVATE HEALTH INSURANCE | Attending: Cardiology | Admitting: Radiology

## 2011-08-23 VITALS — BP 129/77 | Ht 74.0 in | Wt 176.0 lb

## 2011-08-23 DIAGNOSIS — R9439 Abnormal result of other cardiovascular function study: Secondary | ICD-10-CM | POA: Insufficient documentation

## 2011-08-23 DIAGNOSIS — R9431 Abnormal electrocardiogram [ECG] [EKG]: Secondary | ICD-10-CM | POA: Insufficient documentation

## 2011-08-23 DIAGNOSIS — R0602 Shortness of breath: Secondary | ICD-10-CM

## 2011-08-23 DIAGNOSIS — I10 Essential (primary) hypertension: Secondary | ICD-10-CM | POA: Insufficient documentation

## 2011-08-23 DIAGNOSIS — F17200 Nicotine dependence, unspecified, uncomplicated: Secondary | ICD-10-CM | POA: Insufficient documentation

## 2011-08-23 MED ORDER — TECHNETIUM TC 99M TETROFOSMIN IV KIT
10.0000 | PACK | Freq: Once | INTRAVENOUS | Status: AC | PRN
  Administered 2011-08-23: 10 via INTRAVENOUS

## 2011-08-23 MED ORDER — TECHNETIUM TC 99M TETROFOSMIN IV KIT
30.0000 | PACK | Freq: Once | INTRAVENOUS | Status: AC | PRN
  Administered 2011-08-23: 30 via INTRAVENOUS

## 2011-08-23 NOTE — Progress Notes (Signed)
[facility] SITE 3 NUCLEAR MED [address] [place] [address] [phone]  Cardiology Nuclear Med Study  Jonathan [name] is a 56 y.o. male     MRN : [id number]     DOB: [birth year]  Procedure Date: 08/23/2011  Nuclear Med Background Indication for Stress Test:  Evaluation for Ischemia and Abnormal EKG, and Abnormal GXT History:  Abnormal EKG,08/10/11'GXT ST depression with exercise, HTN response Cardiac Risk Factors: Hypertension and Smoker  Symptoms:  no symptoms   Nuclear Pre-Procedure Caffeine/Decaff Intake:  5:00pm yesterday NPO After: 5:00pm yesterday   Lungs:  clear O2 Sat: 98% on room air. IV 0.9% NS with Angio Cath:  22g  IV Site: R Antecubital x 1, tolerated well IV Started by:  Irean Hong, RN  Chest Size (in):  38 Cup Size: n/a  Height: 6\' 2"  (1.88 m)  Weight:  176 lb (79.833 kg)  BMI:  Body mass index is 22.60 kg/(m^2). Tech Comments:  n/a    Nuclear Med Study 1 or 2 day study: 1 day  Stress Test Type:  Stress  Reading MD: Olga Millers, MD  Order Authorizing Provider:  [name], MD  Resting Radionuclide: Technetium 87m Tetrofosmin  Resting Radionuclide Dose: 11.0 mCi   Stress Radionuclide:  Technetium 37m Tetrofosmin  Stress Radionuclide Dose: 33.0 mCi           Stress Protocol Rest HR: 54 Stress HR: 153  Rest BP: 129/77 Stress BP: 198/84  Exercise Time (min): 10:32 min METS: 11.30   Predicted Max HR: 162 bpm % Max HR: 94.44 bpm Rate Pressure Product: 21308   Dose of Adenosine (mg):  n/a Dose of Lexiscan: n/a mg  Dose of Atropine (mg): n/a Dose of Dobutamine: n/a mcg/kg/min (at max HR)  Stress Test Technologist: Frederick Peers, EMT-P  Nuclear Technologist:  Doyne Keel, CNMT     Rest Procedure:  Myocardial perfusion imaging was performed at rest 45 minutes following the intravenous administration of Technetium 65m Tetrofosmin. Rest ECG: SB with NSST changes  Stress Procedure:  The patient performed treadmill exercise  using a Bruce  Protocol for 10:32 minutes. The patient stopped due to SOB and denied any chest pain.  There were non specific ST-T wave changes.  Technetium 23m Tetrofosmin was injected at peak exercise and myocardial perfusion imaging was performed after a brief delay. Stress ECG: No significant ST segment change suggestive of ischemia.  QPS Raw Data Images:  Acquisition technically good; LVE. Stress Images:  There is decreased uptake in the inferoseptal wall. Rest Images:  There is decreased uptake in the inferoseptal wall. Subtraction (SDS):  No evidence of ischemia. Transient Ischemic Dilatation (Normal <1.22):  1.00 Lung/Heart Ratio (Normal <0.45):  0.30  Quantitative Gated Spect Images QGS EDV:  128 ml QGS ESV:  61 ml  Impression Exercise Capacity:  Good exercise capacity. BP Response:  Normal blood pressure response. Clinical Symptoms:  There is dyspnea. ECG Impression:  No significant ST segment change suggestive of ischemia. Comparison with Prior Nuclear Study: No images to compare  Overall Impression:  Low risk stress nuclear study with a small, moderate intensity, fixed inferoseptal defect consistent with inferoseptal thinning vs prior infarct; no ischemia.  LV Ejection Fraction: 52%.  LV Wall Motion:  NL LV Function; NL Wall Motion  Olga Millers  .

## 2011-08-29 ENCOUNTER — Ambulatory Visit (AMBULATORY_SURGERY_CENTER): Payer: PRIVATE HEALTH INSURANCE | Admitting: Gastroenterology

## 2011-08-29 ENCOUNTER — Encounter: Payer: Self-pay | Admitting: Gastroenterology

## 2011-08-29 VITALS — BP 129/83 | HR 70 | Temp 97.8°F | Resp 14 | Ht 74.5 in | Wt 180.0 lb

## 2011-08-29 DIAGNOSIS — D126 Benign neoplasm of colon, unspecified: Secondary | ICD-10-CM

## 2011-08-29 DIAGNOSIS — K5730 Diverticulosis of large intestine without perforation or abscess without bleeding: Secondary | ICD-10-CM

## 2011-08-29 DIAGNOSIS — Z1211 Encounter for screening for malignant neoplasm of colon: Secondary | ICD-10-CM

## 2011-08-29 DIAGNOSIS — K635 Polyp of colon: Secondary | ICD-10-CM

## 2011-08-29 HISTORY — DX: Polyp of colon: K63.5

## 2011-08-29 LAB — HM COLONOSCOPY

## 2011-08-29 MED ORDER — SODIUM CHLORIDE 0.9 % IV SOLN: 500.0000 mL | INTRAVENOUS | Status: DC

## 2011-08-29 NOTE — Patient Instructions (Addendum)
YOU HAD AN ENDOSCOPIC PROCEDURE TODAY AT THE [facility]: Refer to the procedure report that was given to you for any specific questions about what was found during the examination.  If the procedure report does not answer your questions, please call your gastroenterologist to clarify.  If you requested that your care partner not be given the details of your procedure findings, then the procedure report has been included in a sealed envelope for you to review at your convenience later.  YOU SHOULD EXPECT: Some feelings of bloating in the abdomen. Passage of more gas than usual.  Walking can help get rid of the air that was put into your GI tract during the procedure and reduce the bloating. If you had a lower endoscopy (such as a colonoscopy or flexible sigmoidoscopy) you may notice spotting of blood in your stool or on the toilet paper. If you underwent a bowel prep for your procedure, then you may not have a normal bowel movement for a few days.  DIET: Your first meal following the procedure should be a light meal and then it is ok to progress to your normal diet.  A half-sandwich or bowl of soup is an example of a good first meal.  Heavy or fried foods are harder to digest and may make you feel nauseous or bloated.  Likewise meals heavy in dairy and vegetables can cause extra gas to form and this can also increase the bloating.  Drink plenty of fluids but you should avoid alcoholic beverages for 24 hours.  ACTIVITY: Your care partner should take you home directly after the procedure.  You should plan to take it easy, moving slowly for the rest of the day.  You can resume normal activity the day after the procedure however you should NOT DRIVE or use heavy machinery for 24 hours (because of the sedation medicines used during the test).    SYMPTOMS TO REPORT IMMEDIATELY: A gastroenterologist can be reached at any hour.  During normal business hours, 8:30 AM to 5:00 PM Monday through Friday,  call [phone].  After hours and on weekends, please call the GI answering service at [phone] who will take a message and have the physician on call contact you.   Following lower endoscopy (colonoscopy or flexible sigmoidoscopy):  Excessive amounts of blood in the stool  Significant tenderness or worsening of abdominal pains  Swelling of the abdomen that is new, acute  Fever of 100F or higher    FOLLOW UP: If any biopsies were taken you will be contacted by phone or by letter within the next 1-3 weeks.  Call your gastroenterologist if you have not heard about the biopsies in 3 weeks.  Our staff will call the home number listed on your records the next business day following your procedure to check on you and address any questions or concerns that you may have at that time regarding the information given to you following your procedure. This is a courtesy call and so if there is no answer at the home number and we have not heard from you through the emergency physician on call, we will assume that you have returned to your regular daily activities without incident.  SIGNATURES/CONFIDENTIALITY: You and/or your care partner have signed paperwork which will be entered into your electronic medical record.  These signatures attest to the fact that that the information above on your After Visit Summary has been reviewed and is understood.  Full responsibility of the confidentiality   of this discharge information lies with you and/or your care-partner.    Information on polyps ,diverticulosis, &high fiber diet given to you today.  No aspirin or ant-iinflammatory medications x 2 weeks

## 2011-08-29 NOTE — Progress Notes (Signed)
Patient did not experience any of the following events: a burn prior to discharge; a fall within the facility; wrong site/side/patient/procedure/implant event; or a hospital transfer or hospital admission upon discharge from the facility. (G8907) Patient did not have preoperative order for IV antibiotic SSI prophylaxis. (G8918)  

## 2011-08-29 NOTE — Op Note (Signed)
[facility] [address] [place], [place]  [address]  COLONOSCOPY PROCEDURE REPORT  PATIENT:  Jonathan [name], Jonathan [name]  MR#:  [id number] BIRTHDATE:  1955-06-09, 56 yrs. old  GENDER:  male ENDOSCOPIST:  Vania Rea. Jarold Motto, MD, [facility] REF. BY:  Etta Grandchild, M.D. PROCEDURE DATE:  08/29/2011 PROCEDURE:  Colonoscopy with snare polypectomy ASA CLASS:  Class I INDICATIONS:  Routine Risk Screening MEDICATIONS:   propofol (Diprivan) 300 mg IV FREQUENT PVC'S NOTED DURING EXAM,BP WAS NORMAL,HE HAS BASELINE BRADYCARDIA NOTED.  DESCRIPTION OF PROCEDURE:   After the risks and benefits and of the procedure were explained, informed consent was obtained. Digital rectal exam was performed and revealed no abnormalities. The LB CF-[id number] endoscope was introduced through the anus and advanced to the cecum, which was identified by both the appendix and ileocecal valve.  The quality of the prep was excellent, using MoviPrep.  The instrument was then slowly withdrawn as the colon was fully examined. <<PROCEDUREIMAGES>>  FINDINGS:  ENDOSCOPIC FINDINGS:   A sessile polyp was found in the ascending colon. .5 CM. FLAT RIGHT COLON POLYP REMOVED IN 2 PIECES.PATH TO LAB. There were mild diverticular changes in left colon. diverticulosis was found.  This was otherwise a normal examination of the colon.   Retroflexed views in the rectum revealed hypertrophied anal papillae.    The scope was then withdrawn from the patient and the procedure completed.  COMPLICATIONS:  None ENDOSCOPIC IMPRESSION: 1) Diverticulosis,mild,left sided diverticulosis 2) Sessile polyp in the ascending colon 3) Otherwise normal examination 4) Hypertrophied anal papillae RECOMMENDATIONS: 1) High fiber diet. 2) Repeat Colonoscopy in 3 years.  REPEAT EXAM:  No  ______________________________ Vania Rea. Jarold Motto, MD, Clement[facility] Graham  CC:  [name], MD  n. Rosalie DoctorMarland Kitchen   Vania Rea. Rona Tomson at 08/29/2011 10:23 AM  [place] Lighter, [phone]

## 2011-08-30 ENCOUNTER — Telehealth: Payer: Self-pay

## 2011-08-30 NOTE — Telephone Encounter (Signed)
Left message on answering machine. 

## 2011-09-02 ENCOUNTER — Encounter: Payer: Self-pay | Admitting: Gastroenterology

## 2011-09-22 ENCOUNTER — Ambulatory Visit: Payer: PRIVATE HEALTH INSURANCE | Admitting: Cardiology

## 2011-11-10 ENCOUNTER — Other Ambulatory Visit: Payer: Self-pay | Admitting: Internal Medicine

## 2012-01-13 ENCOUNTER — Other Ambulatory Visit: Payer: Self-pay | Admitting: Internal Medicine

## 2012-01-16 ENCOUNTER — Other Ambulatory Visit: Payer: Self-pay | Admitting: Internal Medicine

## 2012-01-20 ENCOUNTER — Ambulatory Visit (INDEPENDENT_AMBULATORY_CARE_PROVIDER_SITE_OTHER): Payer: PRIVATE HEALTH INSURANCE | Admitting: Internal Medicine

## 2012-01-20 ENCOUNTER — Encounter: Payer: Self-pay | Admitting: Internal Medicine

## 2012-01-20 VITALS — BP 132/82 | HR 64 | Temp 97.6°F | Resp 16 | Wt 157.0 lb

## 2012-01-20 DIAGNOSIS — I10 Essential (primary) hypertension: Secondary | ICD-10-CM

## 2012-01-20 DIAGNOSIS — M545 Low back pain, unspecified: Secondary | ICD-10-CM

## 2012-01-20 MED ORDER — CYCLOBENZAPRINE HCL 10 MG PO TABS: 10.0000 mg | ORAL_TABLET | Freq: Two times a day (BID) | ORAL | Status: DC | PRN

## 2012-01-20 MED ORDER — ALPRAZOLAM 1 MG PO TABS: 1.0000 mg | ORAL_TABLET | Freq: Three times a day (TID) | ORAL | Status: DC | PRN

## 2012-01-20 MED ORDER — HYDROCODONE-ACETAMINOPHEN 10-325 MG PO TABS: 1.0000 | ORAL_TABLET | Freq: Four times a day (QID) | ORAL | Status: DC | PRN

## 2012-01-20 NOTE — Assessment & Plan Note (Signed)
He is doing well on his current meds and will continue with no changes

## 2012-01-20 NOTE — Assessment & Plan Note (Signed)
His BP is well controlled with lifestyle modifications 

## 2012-01-20 NOTE — Patient Instructions (Signed)
Back Pain, Adult Low back pain is very common. About 1 in 5 people have back pain.The cause of low back pain is rarely dangerous. The pain often gets better over time.About half of people with a sudden onset of back pain feel better in just 2 weeks. About 8 in 10 people feel better by 6 weeks.  CAUSES Some common causes of back pain include:  Strain of the muscles or ligaments supporting the spine.   Wear and tear (degeneration) of the spinal discs.   Arthritis.   Direct injury to the back.  DIAGNOSIS Most of the time, the direct cause of low back pain is not known.However, back pain can be treated effectively even when the exact cause of the pain is unknown.Answering your caregiver's questions about your overall health and symptoms is one of the most accurate ways to make sure the cause of your pain is not dangerous. If your caregiver needs more information, he or she may order lab work or imaging tests (X-rays or MRIs).However, even if imaging tests show changes in your back, this usually does not require surgery. HOME CARE INSTRUCTIONS For many people, back pain returns.Since low back pain is rarely dangerous, it is often a condition that people can learn to manageon their own.   Remain active. It is stressful on the back to sit or stand in one place. Do not sit, drive, or stand in one place for more than 30 minutes at a time. Take short walks on level surfaces as soon as pain allows.Try to increase the length of time you walk each day.   Do not stay in bed.Resting more than 1 or 2 days can delay your recovery.   Do not avoid exercise or work.Your body is made to move.It is not dangerous to be active, even though your back may hurt.Your back will likely heal faster if you return to being active before your pain is gone.   Pay attention to your body when you bend and lift. Many people have less discomfortwhen lifting if they bend their knees, keep the load close to their  bodies,and avoid twisting. Often, the most comfortable positions are those that put less stress on your recovering back.   Find a comfortable position to sleep. Use a firm mattress and lie on your side with your knees slightly bent. If you lie on your back, put a pillow under your knees.   Only take over-the-counter or prescription medicines as directed by your caregiver. Over-the-counter medicines to reduce pain and inflammation are often the most helpful.Your caregiver may prescribe muscle relaxant drugs.These medicines help dull your pain so you can more quickly return to your normal activities and healthy exercise.   Put ice on the injured area.   Put ice in a plastic bag.   Place a towel between your skin and the bag.   Leave the ice on for 15 to 20 minutes, 3 to 4 times a day for the first 2 to 3 days. After that, ice and heat may be alternated to reduce pain and spasms.   Ask your caregiver about trying back exercises and gentle massage. This may be of some benefit.   Avoid feeling anxious or stressed.Stress increases muscle tension and can worsen back pain.It is important to recognize when you are anxious or stressed and learn ways to manage it.Exercise is a great option.  SEEK MEDICAL CARE IF:  You have pain that is not relieved with rest or medicine.   You have   pain that does not improve in 1 week.   You have new symptoms.   You are generally not feeling well.  SEEK IMMEDIATE MEDICAL CARE IF:   You have pain that radiates from your back into your legs.   You develop new bowel or bladder control problems.   You have unusual weakness or numbness in your arms or legs.   You develop nausea or vomiting.   You develop abdominal pain.   You feel faint.  Document Released: 05/16/2005 Document Revised: 05/05/2011 Document Reviewed: 10/04/2010 Exit[employer] Patient Information 2012 [employer]. 

## 2012-01-20 NOTE — Progress Notes (Signed)
Subjective:    Patient ID: Jonathan [name], male    DOB: Jun 13, 1955, 56 y.o.   MRN: [id number]  Back Pain This is a chronic problem. The current episode started more than 1 year ago. The problem occurs intermittently. The problem is unchanged. The pain is present in the lumbar spine. The quality of the pain is described as aching (and spasms). The pain does not radiate. The pain is at a severity of 6/10. The pain is moderate. The pain is worse during the day. The symptoms are aggravated by bending and standing. Stiffness is present all day. Pertinent negatives include no abdominal pain, bladder incontinence, bowel incontinence, chest pain, dysuria, fever, headaches, leg pain, numbness, paresis, paresthesias, pelvic pain, perianal numbness, tingling, weakness or weight loss. He has tried NSAIDs, muscle relaxant and analgesics (xanax for spasms) for the symptoms. The treatment provided significant relief.      Review of Systems  Constitutional: Positive for unexpected weight change (he has lost weight by running about 5 miles every other day). Negative for fever, chills, weight loss, diaphoresis, activity change, appetite change and fatigue.  HENT: Negative.   Respiratory: Negative for apnea, cough, choking, chest tightness, shortness of breath, wheezing and stridor.   Cardiovascular: Negative for chest pain, palpitations and leg swelling.  Gastrointestinal: Negative.  Negative for abdominal pain and bowel incontinence.  Genitourinary: Negative.  Negative for bladder incontinence, dysuria and pelvic pain.  Musculoskeletal: Positive for back pain. Negative for myalgias, joint swelling, arthralgias and gait problem.  Skin: Negative.   Neurological: Negative for dizziness, tingling, tremors, seizures, syncope, facial asymmetry, speech difficulty, weakness, light-headedness, numbness, headaches and paresthesias.  Hematological: Negative for adenopathy. Does not bruise/bleed easily.    Psychiatric/Behavioral: Negative.        Objective:   Physical Exam  Vitals reviewed. Constitutional: He is oriented to person, place, and time. He appears well-developed and well-nourished. No distress.  HENT:  Head: Normocephalic and atraumatic.  Mouth/Throat: Oropharynx is clear and moist. No oropharyngeal exudate.  Eyes: Conjunctivae are normal. Right eye exhibits no discharge. Left eye exhibits no discharge. No scleral icterus.  Neck: Normal range of motion. Neck supple. No JVD present. No tracheal deviation present. No thyromegaly present.  Cardiovascular: Normal rate, regular rhythm, normal heart sounds and intact distal pulses.  Exam reveals no gallop and no friction rub.   No murmur heard. Pulmonary/Chest: Effort normal and breath sounds normal. No stridor. No respiratory distress. He has no wheezes. He has no rales. He exhibits no tenderness.  Abdominal: Soft. Bowel sounds are normal. He exhibits no distension and no mass. There is no tenderness. There is no rebound and no guarding.  Musculoskeletal: Normal range of motion. He exhibits no edema and no tenderness.       Lumbar back: Normal. He exhibits normal range of motion, no tenderness, no bony tenderness, no swelling, no edema, no deformity, no laceration, no pain, no spasm and normal pulse.  Lymphadenopathy:    He has no cervical adenopathy.  Neurological: He is alert and oriented to person, place, and time. He has normal strength. He displays no atrophy and normal reflexes. No cranial nerve deficit or sensory deficit. He exhibits normal muscle tone. He displays a negative Romberg sign. He displays no seizure activity. Coordination and gait normal.  Reflex Scores:      Bicep reflexes are 1+ on the right side and 1+ on the left side.      Brachioradialis reflexes are 1+ on the right side and 1+  on the left side.      Patellar reflexes are 2+ on the right side and 2+ on the left side.      Achilles reflexes are 1+ on the  right side and 1+ on the left side. Skin: Skin is warm and dry. No rash noted. He is not diaphoretic. No erythema. No pallor.  Psychiatric: He has a normal mood and affect. His behavior is normal. Judgment and thought content normal.      Lab Results  Component Value Date   WBC 3.8* 07/14/2011   HGB 16.0 07/14/2011   HCT 48.2 07/14/2011   PLT 234.0 07/14/2011   GLUCOSE 99 07/14/2011   CHOL 208* 07/14/2011   TRIG 61.0 07/14/2011   HDL 62.10 07/14/2011   LDLDIRECT 131.6 07/14/2011   ALT 16 07/14/2011   AST 17 07/14/2011   NA 138 07/14/2011   K 5.6* 07/14/2011   CL 106 07/14/2011   CREATININE 0.9 07/14/2011   BUN 9 07/14/2011   CO2 27 07/14/2011   TSH 0.65 07/14/2011   PSA 2.00 07/14/2011      Assessment & Plan:

## 2012-05-11 ENCOUNTER — Encounter: Payer: Self-pay | Admitting: Internal Medicine

## 2012-05-11 ENCOUNTER — Ambulatory Visit (INDEPENDENT_AMBULATORY_CARE_PROVIDER_SITE_OTHER): Payer: PRIVATE HEALTH INSURANCE | Admitting: Internal Medicine

## 2012-05-11 ENCOUNTER — Other Ambulatory Visit (INDEPENDENT_AMBULATORY_CARE_PROVIDER_SITE_OTHER): Payer: PRIVATE HEALTH INSURANCE

## 2012-05-11 VITALS — BP 130/84 | HR 69 | Temp 97.7°F | Resp 16 | Wt 166.0 lb

## 2012-05-11 DIAGNOSIS — M545 Low back pain, unspecified: Secondary | ICD-10-CM

## 2012-05-11 DIAGNOSIS — N401 Enlarged prostate with lower urinary tract symptoms: Secondary | ICD-10-CM

## 2012-05-11 DIAGNOSIS — M25552 Pain in left hip: Secondary | ICD-10-CM | POA: Insufficient documentation

## 2012-05-11 DIAGNOSIS — M25559 Pain in unspecified hip: Secondary | ICD-10-CM

## 2012-05-11 DIAGNOSIS — I10 Essential (primary) hypertension: Secondary | ICD-10-CM

## 2012-05-11 DIAGNOSIS — N138 Other obstructive and reflux uropathy: Secondary | ICD-10-CM

## 2012-05-11 LAB — BASIC METABOLIC PANEL
BUN: 11 mg/dL (ref 6–23)
CO2: 27 mEq/L (ref 19–32)
Calcium: 9.6 mg/dL (ref 8.4–10.5)
Chloride: 100 mEq/L (ref 96–112)
Creatinine, Ser: 0.9 mg/dL (ref 0.4–1.5)
GFR: 107.05 mL/min (ref >60.00)
Glucose, Bld: 85 mg/dL (ref 70–99)
Potassium: 4.5 mEq/L (ref 3.5–5.1)
Sodium: 134 mEq/L — ABNORMAL LOW (ref 135–145)

## 2012-05-11 LAB — URINALYSIS, ROUTINE W REFLEX MICROSCOPIC
Bilirubin Urine: NEGATIVE
Ketones, ur: NEGATIVE
Leukocytes, UA: NEGATIVE
Nitrite: NEGATIVE
Specific Gravity, Urine: >=1.03 (ref 1.000–1.030)
Total Protein, Urine: NEGATIVE
Urine Glucose: NEGATIVE
Urobilinogen, UA: 0.2 (ref 0.0–1.0)
pH: 5.5 (ref 5.0–8.0)

## 2012-05-11 MED ORDER — CYCLOBENZAPRINE HCL 10 MG PO TABS: 10.0000 mg | ORAL_TABLET | Freq: Two times a day (BID) | ORAL | Status: DC | PRN

## 2012-05-11 MED ORDER — ALPRAZOLAM 1 MG PO TABS: 1.0000 mg | ORAL_TABLET | Freq: Three times a day (TID) | ORAL | Status: DC | PRN

## 2012-05-11 MED ORDER — HYDROCODONE-ACETAMINOPHEN 10-325 MG PO TABS: 1.0000 | ORAL_TABLET | Freq: Four times a day (QID) | ORAL | Status: DC | PRN

## 2012-05-11 NOTE — Assessment & Plan Note (Signed)
I will check his plain films to see if there has been a progression in his disease He will continue the current meds for pain I will check his UDS today to see that he is compliant with the stated meds and to check for substance abuse

## 2012-05-11 NOTE — Assessment & Plan Note (Signed)
I will check a plain film of his left hip today He will continue the current meds for pain

## 2012-05-11 NOTE — Progress Notes (Signed)
Subjective:    Patient ID: Jonathan [name], male    DOB: 06-11-1955, 56 y.o.   MRN: [id number]  Arthritis Presents for follow-up visit. He complains of pain and stiffness. He reports no joint swelling or joint warmth. The symptoms have been worsening. Affected locations include the left hip. His pain is at a severity of 3/10. Associated symptoms include pain while resting. Pertinent negatives include no diarrhea, dry eyes, dry mouth, dysuria, fatigue, fever, rash, Raynaud's syndrome, uveitis or weight loss. Compliance with total regimen is 76-100%.      Review of Systems  Constitutional: Negative for fever, chills, weight loss, diaphoresis, activity change, appetite change, fatigue and unexpected weight change.  HENT: Negative.   Eyes: Negative.   Respiratory: Negative for cough, chest tightness, shortness of breath, wheezing and stridor.   Cardiovascular: Negative for chest pain, palpitations and leg swelling.  Gastrointestinal: Negative for nausea, vomiting, abdominal pain, diarrhea, blood in stool and anal bleeding.  Genitourinary: Negative.  Negative for dysuria.  Musculoskeletal: Positive for back pain, arthralgias (left hip), arthritis and stiffness. Negative for myalgias, joint swelling and gait problem.  Skin: Negative for color change, pallor, rash and wound.  Neurological: Negative.   Hematological: Negative for adenopathy. Does not bruise/bleed easily.  Psychiatric/Behavioral: Negative for suicidal ideas, hallucinations, behavioral problems, confusion, sleep disturbance, self-injury, dysphoric mood, decreased concentration and agitation. The patient is nervous/anxious. The patient is not hyperactive.        Objective:   Physical Exam  Vitals reviewed. Constitutional: He is oriented to person, place, and time. He appears well-developed and well-nourished. No distress.  HENT:  Head: Normocephalic and atraumatic.  Mouth/Throat: Oropharynx is clear and moist. No oropharyngeal  exudate.  Eyes: Conjunctivae normal are normal. Right eye exhibits no discharge. Left eye exhibits no discharge. No scleral icterus.  Neck: Normal range of motion. Neck supple. No JVD present. No tracheal deviation present. No thyromegaly present.  Cardiovascular: Normal rate, regular rhythm, normal heart sounds and intact distal pulses.  Exam reveals no gallop and no friction rub.   No murmur heard. Pulmonary/Chest: Effort normal and breath sounds normal. No stridor. No respiratory distress. He has no wheezes. He has no rales. He exhibits no tenderness.  Abdominal: Soft. Bowel sounds are normal. He exhibits no distension and no mass. There is no tenderness. There is no rebound and no guarding.  Musculoskeletal: Normal range of motion. He exhibits no edema and no tenderness.       Left hip: Normal. He exhibits normal range of motion, normal strength, no tenderness, no bony tenderness, no swelling, no crepitus, no deformity and no laceration.       Lumbar back: Normal. He exhibits normal range of motion, no tenderness, no bony tenderness, no swelling, no edema, no deformity, no laceration, no pain, no spasm and normal pulse.  Lymphadenopathy:    He has no cervical adenopathy.  Neurological: He is alert and oriented to person, place, and time. He has normal strength. He displays no atrophy, no tremor and normal reflexes. No cranial nerve deficit or sensory deficit. He exhibits normal muscle tone. He displays a negative Romberg sign. He displays no seizure activity. Coordination and gait normal. He displays no Babinski's sign on the right side. He displays no Babinski's sign on the left side.  Reflex Scores:      Tricep reflexes are 1+ on the right side and 1+ on the left side.      Bicep reflexes are 1+ on the right side and 1+  on the left side.      Brachioradialis reflexes are 1+ on the right side and 1+ on the left side.      Patellar reflexes are 1+ on the right side and 1+ on the left side.       Achilles reflexes are 1+ on the right side and 1+ on the left side.      - SLR in BLE  Skin: Skin is warm and dry. No rash noted. He is not diaphoretic. No erythema. No pallor.  Psychiatric: He has a normal mood and affect. His behavior is normal. Judgment and thought content normal.     Lab Results  Component Value Date   WBC 3.8* 07/14/2011   HGB 16.0 07/14/2011   HCT 48.2 07/14/2011   PLT 234.0 07/14/2011   GLUCOSE 99 07/14/2011   CHOL 208* 07/14/2011   TRIG 61.0 07/14/2011   HDL 62.10 07/14/2011   LDLDIRECT 131.6 07/14/2011   ALT 16 07/14/2011   AST 17 07/14/2011   NA 138 07/14/2011   K 5.6* 07/14/2011   CL 106 07/14/2011   CREATININE 0.9 07/14/2011   BUN 9 07/14/2011   CO2 27 07/14/2011   TSH 0.65 07/14/2011   PSA 2.00 07/14/2011       Assessment & Plan:

## 2012-05-11 NOTE — Assessment & Plan Note (Signed)
His BP is well controlled I will recheck his renal function today

## 2012-05-11 NOTE — Patient Instructions (Signed)
Back Pain, Adult Low back pain is very common. About 1 in 5 people have back pain.The cause of low back pain is rarely dangerous. The pain often gets better over time.About half of people with a sudden onset of back pain feel better in just 2 weeks. About 8 in 10 people feel better by 6 weeks.  CAUSES Some common causes of back pain include:  Strain of the muscles or ligaments supporting the spine.  Wear and tear (degeneration) of the spinal discs.  Arthritis.  Direct injury to the back. DIAGNOSIS Most of the time, the direct cause of low back pain is not known.However, back pain can be treated effectively even when the exact cause of the pain is unknown.Answering your caregiver's questions about your overall health and symptoms is one of the most accurate ways to make sure the cause of your pain is not dangerous. If your caregiver needs more information, he or she may order lab work or imaging tests (X-rays or MRIs).However, even if imaging tests show changes in your back, this usually does not require surgery. HOME CARE INSTRUCTIONS For many people, back pain returns.Since low back pain is rarely dangerous, it is often a condition that people can learn to manageon their own.   Remain active. It is stressful on the back to sit or stand in one place. Do not sit, drive, or stand in one place for more than 30 minutes at a time. Take short walks on level surfaces as soon as pain allows.Try to increase the length of time you walk each day.  Do not stay in bed.Resting more than 1 or 2 days can delay your recovery.  Do not avoid exercise or work.Your body is made to move.It is not dangerous to be active, even though your back may hurt.Your back will likely heal faster if you return to being active before your pain is gone.  Pay attention to your body when you bend and lift. Many people have less discomfortwhen lifting if they bend their knees, keep the load close to their bodies,and  avoid twisting. Often, the most comfortable positions are those that put less stress on your recovering back.  Find a comfortable position to sleep. Use a firm mattress and lie on your side with your knees slightly bent. If you lie on your back, put a pillow under your knees.  Only take over-the-counter or prescription medicines as directed by your caregiver. Over-the-counter medicines to reduce pain and inflammation are often the most helpful.Your caregiver may prescribe muscle relaxant drugs.These medicines help dull your pain so you can more quickly return to your normal activities and healthy exercise.  Put ice on the injured area.  Put ice in a plastic bag.  Place a towel between your skin and the bag.  Leave the ice on for 15 to 20 minutes, 3 to 4 times a day for the first 2 to 3 days. After that, ice and heat may be alternated to reduce pain and spasms.  Ask your caregiver about trying back exercises and gentle massage. This may be of some benefit.  Avoid feeling anxious or stressed.Stress increases muscle tension and can worsen back pain.It is important to recognize when you are anxious or stressed and learn ways to manage it.Exercise is a great option. SEEK MEDICAL CARE IF:  You have pain that is not relieved with rest or medicine.  You have pain that does not improve in 1 week.  You have new symptoms.  You are generally   not feeling well. SEEK IMMEDIATE MEDICAL CARE IF:   You have pain that radiates from your back into your legs.  You develop new bowel or bladder control problems.  You have unusual weakness or numbness in your arms or legs.  You develop nausea or vomiting.  You develop abdominal pain.  You feel faint. Document Released: 05/16/2005 Document Revised: 11/15/2011 Document Reviewed: 10/04/2010 [employer] Patient Information 2013 [employer].  

## 2012-05-12 LAB — DRUGS OF ABUSE SCREEN W/O ALC, ROUTINE URINE
Amphetamine Screen, Ur: NEGATIVE
Barbiturate Quant, Ur: NEGATIVE
Benzodiazepines.: POSITIVE — AB
Cocaine Metabolites: NEGATIVE
Creatinine,U: 219.4 mg/dL
Marijuana Metabolite: POSITIVE — AB
Methadone: NEGATIVE
Opiate Screen, Urine: POSITIVE — AB
Phencyclidine (PCP): NEGATIVE
Propoxyphene: NEGATIVE

## 2012-05-14 ENCOUNTER — Encounter: Payer: Self-pay | Admitting: Internal Medicine

## 2012-05-15 LAB — BENZODIAZEPINES (GC/LC/MS), URINE
Alprazolam (GC/LC/MS), ur confirm: 203 ng/mL
Alprazolam metabolite (GC/LC/MS), ur confirm: 323 ng/mL
Clonazepam metabolite (GC/LC/MS), ur confirm: NEGATIVE ng/mL
Diazepam (GC/LC/MS), ur confirm: NEGATIVE ng/mL
Estazolam (GC/LC/MS), ur confirm: NEGATIVE ng/mL
Flunitrazepam metabolite (GC/LC/MS), ur confirm: NEGATIVE ng/mL
Flurazepam metabolite (GC/LC/MS), ur confirm: NEGATIVE ng/mL
Lorazepam (GC/LC/MS), ur confirm: NEGATIVE ng/mL
Midazolam (GC/LC/MS), ur confirm: NEGATIVE ng/mL
Nordiazepam (GC/LC/MS), ur confirm: NEGATIVE ng/mL
Oxazepam (GC/LC/MS), ur confirm: NEGATIVE ng/mL
Temazepam (GC/LC/MS), ur confirm: NEGATIVE ng/mL
Triazolam metabolite (GC/LC/MS), ur confirm: NEGATIVE ng/mL

## 2012-05-15 LAB — OPIATES/OPIOIDS (LC/MS-MS)
Codeine Urine: NEGATIVE ng/mL
Heroin (6-AM), UR: NEGATIVE ng/mL
Hydrocodone: 3013 ng/mL
Hydromorphone: 237 ng/mL
Morphine Urine: NEGATIVE ng/mL
Norhydrocodone, Ur: 2415 ng/mL
Noroxycodone, Ur: NEGATIVE ng/mL
Oxycodone, ur: NEGATIVE ng/mL
Oxymorphone: NEGATIVE ng/mL

## 2012-05-16 LAB — CANNABANOIDS (GC/LC/MS), URINE: THC-COOH (GC/LC/MS), ur confirm: 1008 ng/mL — ABNORMAL HIGH

## 2012-05-17 ENCOUNTER — Telehealth: Payer: Self-pay

## 2012-05-17 DIAGNOSIS — M545 Low back pain, unspecified: Secondary | ICD-10-CM

## 2012-05-17 MED ORDER — TIZANIDINE HCL 4 MG PO CAPS: 4.0000 mg | ORAL_CAPSULE | Freq: Three times a day (TID) | ORAL | Status: DC

## 2012-05-17 NOTE — Telephone Encounter (Signed)
done

## 2012-05-17 NOTE — Telephone Encounter (Signed)
Received fax from insurance stating pt has filled an RX for cyclobenzaprine 10mg  tabs.Starting 05/30/12 that rx will no longer be covered, alternatives that will be covered are; meloxicam, tizanidine, or diclofenac

## 2012-07-09 ENCOUNTER — Other Ambulatory Visit: Payer: Self-pay | Admitting: Internal Medicine

## 2012-08-05 ENCOUNTER — Other Ambulatory Visit: Payer: Self-pay | Admitting: Internal Medicine

## 2012-09-04 ENCOUNTER — Other Ambulatory Visit: Payer: Self-pay | Admitting: Internal Medicine

## 2012-09-07 ENCOUNTER — Ambulatory Visit (INDEPENDENT_AMBULATORY_CARE_PROVIDER_SITE_OTHER): Payer: PRIVATE HEALTH INSURANCE | Admitting: Internal Medicine

## 2012-09-07 ENCOUNTER — Ambulatory Visit: Payer: PRIVATE HEALTH INSURANCE | Admitting: Internal Medicine

## 2012-09-07 ENCOUNTER — Other Ambulatory Visit (INDEPENDENT_AMBULATORY_CARE_PROVIDER_SITE_OTHER): Payer: PRIVATE HEALTH INSURANCE

## 2012-09-07 ENCOUNTER — Encounter: Payer: Self-pay | Admitting: Internal Medicine

## 2012-09-07 VITALS — BP 122/80 | HR 50 | Temp 98.6°F | Resp 16 | Ht 72.0 in | Wt 175.0 lb

## 2012-09-07 DIAGNOSIS — R9431 Abnormal electrocardiogram [ECG] [EKG]: Secondary | ICD-10-CM

## 2012-09-07 DIAGNOSIS — M25559 Pain in unspecified hip: Secondary | ICD-10-CM

## 2012-09-07 DIAGNOSIS — Z0000 Encounter for general adult medical examination without abnormal findings: Secondary | ICD-10-CM

## 2012-09-07 DIAGNOSIS — M545 Low back pain, unspecified: Secondary | ICD-10-CM

## 2012-09-07 DIAGNOSIS — I10 Essential (primary) hypertension: Secondary | ICD-10-CM

## 2012-09-07 DIAGNOSIS — M25552 Pain in left hip: Secondary | ICD-10-CM

## 2012-09-07 DIAGNOSIS — N401 Enlarged prostate with lower urinary tract symptoms: Secondary | ICD-10-CM

## 2012-09-07 DIAGNOSIS — N138 Other obstructive and reflux uropathy: Secondary | ICD-10-CM

## 2012-09-07 LAB — URINALYSIS, ROUTINE W REFLEX MICROSCOPIC
Bilirubin Urine: NEGATIVE
Hgb urine dipstick: NEGATIVE
Ketones, ur: NEGATIVE
Leukocytes, UA: NEGATIVE
Nitrite: NEGATIVE
Specific Gravity, Urine: <=1.005 (ref 1.000–1.030)
Total Protein, Urine: NEGATIVE
Urine Glucose: NEGATIVE
Urobilinogen, UA: 0.2 (ref 0.0–1.0)
pH: 7 (ref 5.0–8.0)

## 2012-09-07 LAB — COMPREHENSIVE METABOLIC PANEL
ALT: 18 U/L (ref 0–53)
AST: 16 U/L (ref 0–37)
Albumin: 4.3 g/dL (ref 3.5–5.2)
Alkaline Phosphatase: 51 U/L (ref 39–117)
BUN: 7 mg/dL (ref 6–23)
CO2: 28 mEq/L (ref 19–32)
Calcium: 9.1 mg/dL (ref 8.4–10.5)
Chloride: 101 mEq/L (ref 96–112)
Creatinine, Ser: 0.8 mg/dL (ref 0.4–1.5)
GFR: 125.41 mL/min (ref >60.00)
Glucose, Bld: 87 mg/dL (ref 70–99)
Potassium: 3.7 mEq/L (ref 3.5–5.1)
Sodium: 135 mEq/L (ref 135–145)
Total Bilirubin: 0.7 mg/dL (ref 0.3–1.2)
Total Protein: 6.9 g/dL (ref 6.0–8.3)

## 2012-09-07 LAB — CBC WITH DIFFERENTIAL/PLATELET
Basophils Absolute: 0 10*3/uL (ref 0.0–0.1)
Basophils Relative: 0.5 % (ref 0.0–3.0)
Eosinophils Absolute: 0.1 10*3/uL (ref 0.0–0.7)
Eosinophils Relative: 1.6 % (ref 0.0–5.0)
HCT: 46.9 % (ref 39.0–52.0)
Hemoglobin: 15.7 g/dL (ref 13.0–17.0)
Lymphocytes Relative: 46.7 % — ABNORMAL HIGH (ref 12.0–46.0)
Lymphs Abs: 1.7 10*3/uL (ref 0.7–4.0)
MCHC: 33.5 g/dL (ref 30.0–36.0)
MCV: 87 fl (ref 78.0–100.0)
Monocytes Absolute: 0.4 10*3/uL (ref 0.1–1.0)
Monocytes Relative: 9.8 % (ref 3.0–12.0)
Neutro Abs: 1.5 10*3/uL (ref 1.4–7.7)
Neutrophils Relative %: 41.4 % — ABNORMAL LOW (ref 43.0–77.0)
Platelets: 253 10*3/uL (ref 150.0–400.0)
RBC: 5.39 Mil/uL (ref 4.22–5.81)
RDW: 14.4 % (ref 11.5–14.6)
WBC: 3.6 10*3/uL — ABNORMAL LOW (ref 4.5–10.5)

## 2012-09-07 LAB — LIPID PANEL
Cholesterol: 194 mg/dL (ref 0–200)
HDL: 60 mg/dL (ref >39.00)
LDL Cholesterol: 115 mg/dL — ABNORMAL HIGH (ref 0–99)
Total CHOL/HDL Ratio: 3
Triglycerides: 97 mg/dL (ref 0.0–149.0)
VLDL: 19.4 mg/dL (ref 0.0–40.0)

## 2012-09-07 LAB — FECAL OCCULT BLOOD, GUAIAC: Fecal Occult Blood: NEGATIVE

## 2012-09-07 MED ORDER — HYDROCODONE-ACETAMINOPHEN 10-325 MG PO TABS: 1.0000 | ORAL_TABLET | Freq: Four times a day (QID) | ORAL | Status: DC | PRN

## 2012-09-07 MED ORDER — ALPRAZOLAM 1 MG PO TABS: 1.0000 mg | ORAL_TABLET | Freq: Three times a day (TID) | ORAL | Status: DC | PRN

## 2012-09-07 NOTE — Assessment & Plan Note (Signed)
No s/s noted today related to this

## 2012-09-07 NOTE — Assessment & Plan Note (Signed)
He has no s/s so no need for treatment at this time I will check his PSA toay

## 2012-09-07 NOTE — Assessment & Plan Note (Signed)
Exam done Labs ordered Pt ed material was given 

## 2012-09-07 NOTE — Progress Notes (Signed)
Subjective:    Patient ID: Jonathan [name], male    DOB: Oct 20, 1955, 57 y.o.   MRN: [id number]  Back Pain This is a chronic problem. The current episode started more than 1 year ago. The problem occurs intermittently. The problem is unchanged. The pain is present in the lumbar spine. The quality of the pain is described as aching (and spasms). The pain does not radiate. The pain is at a severity of 5/10. The pain is moderate. The pain is worse during the day. The symptoms are aggravated by bending, position and standing. Pertinent negatives include no abdominal pain, bladder incontinence, bowel incontinence, chest pain, dysuria, fever, headaches, leg pain, numbness, paresis, paresthesias, pelvic pain, perianal numbness, tingling, weakness or weight loss. He has tried muscle relaxant, analgesics and NSAIDs for the symptoms. The treatment provided significant relief.      Review of Systems  Constitutional: Negative.  Negative for fever, chills, weight loss, diaphoresis, activity change, appetite change, fatigue and unexpected weight change.  HENT: Negative.   Eyes: Negative.   Respiratory: Negative.  Negative for apnea, cough, choking, chest tightness, shortness of breath, wheezing and stridor.   Cardiovascular: Negative.  Negative for chest pain, palpitations and leg swelling.  Gastrointestinal: Negative.  Negative for nausea, vomiting, abdominal pain, diarrhea, constipation and bowel incontinence.  Endocrine: Negative.   Genitourinary: Negative.  Negative for bladder incontinence, dysuria and pelvic pain.  Musculoskeletal: Positive for back pain and arthralgias (hips and knees). Negative for myalgias, joint swelling and gait problem.  Skin: Negative.   Allergic/Immunologic: Negative.   Neurological: Negative.  Negative for dizziness, tingling, tremors, syncope, speech difficulty, weakness, numbness, headaches and paresthesias.  Hematological: Negative.  Does not bruise/bleed easily.   Psychiatric/Behavioral: Negative for hallucinations, behavioral problems, confusion, sleep disturbance, self-injury, dysphoric mood, decreased concentration and agitation. The patient is nervous/anxious. The patient is not hyperactive.        Objective:   Physical Exam  Vitals reviewed. Constitutional: He is oriented to person, place, and time. He appears well-developed and well-nourished. No distress.  HENT:  Head: Normocephalic and atraumatic.  Mouth/Throat: Oropharynx is clear and moist. No oropharyngeal exudate.  Eyes: Conjunctivae are normal. Right eye exhibits no discharge. Left eye exhibits no discharge. No scleral icterus.  Neck: Normal range of motion. Neck supple. No JVD present. No tracheal deviation present. No thyromegaly present.  Cardiovascular: Normal rate, regular rhythm, normal heart sounds and intact distal pulses.  Exam reveals no gallop and no friction rub.   No murmur heard. Pulmonary/Chest: Effort normal and breath sounds normal. No stridor. No respiratory distress. He has no wheezes. He has no rales. He exhibits no tenderness.  Abdominal: Soft. Bowel sounds are normal. He exhibits no distension and no mass. There is no tenderness. There is no rebound and no guarding. Hernia confirmed negative in the right inguinal area and confirmed negative in the left inguinal area.  Genitourinary: Testes normal and penis normal. Rectal exam shows external hemorrhoid. Rectal exam shows no internal hemorrhoid, no fissure, no mass, no tenderness and anal tone normal. Guaiac negative stool. Prostate is enlarged (2+ smooth symm BPH). Prostate is not tender. Right testis shows no mass, no swelling and no tenderness. Right testis is descended. Left testis shows no mass, no swelling and no tenderness. Left testis is descended. Circumcised. No penile erythema or penile tenderness. No discharge found.  Musculoskeletal: Normal range of motion. He exhibits no edema and no tenderness.       Right  hip: Normal.  Left hip: Normal.       Right knee: Normal.       Left knee: Normal.       Lumbar back: Normal. He exhibits normal range of motion, no tenderness, no bony tenderness, no swelling, no edema, no deformity, no laceration, no pain, no spasm and normal pulse.  Lymphadenopathy:    He has no cervical adenopathy.       Right: No inguinal adenopathy present.       Left: No inguinal adenopathy present.  Neurological: He is oriented to person, place, and time.  Skin: Skin is warm and dry. No rash noted. He is not diaphoretic. No erythema. No pallor.  Psychiatric: He has a normal mood and affect. His behavior is normal. Judgment and thought content normal.          Assessment & Plan:

## 2012-09-07 NOTE — Assessment & Plan Note (Signed)
His BP is well controlled 

## 2012-09-07 NOTE — Patient Instructions (Signed)
Health Maintenance, Males A healthy lifestyle and preventative care can promote health and wellness.  Maintain regular health, dental, and eye exams.  Eat a healthy diet. Foods like vegetables, fruits, whole grains, low-fat dairy products, and lean protein foods contain the nutrients you need without too many calories. Decrease your intake of foods high in solid fats, added sugars, and salt. Get information about a proper diet from your caregiver, if necessary.  Regular physical exercise is one of the most important things you can do for your health. Most adults should get at least 150 minutes of moderate-intensity exercise (any activity that increases your heart rate and causes you to sweat) each week. In addition, most adults need muscle-strengthening exercises on 2 or more days a week.   Maintain a healthy weight. The body mass index (BMI) is a screening tool to identify possible weight problems. It provides an estimate of body fat based on height and weight. Your caregiver can help determine your BMI, and can help you achieve or maintain a healthy weight. For adults 20 years and older:  A BMI below 18.5 is considered underweight.  A BMI of 18.5 to 24.9 is normal.  A BMI of 25 to 29.9 is considered overweight.  A BMI of 30 and above is considered obese.  Maintain normal blood lipids and cholesterol by exercising and minimizing your intake of saturated fat. Eat a balanced diet with plenty of fruits and vegetables. Blood tests for lipids and cholesterol should begin at age 20 and be repeated every 5 years. If your lipid or cholesterol levels are high, you are over 50, or you are a high risk for heart disease, you may need your cholesterol levels checked more frequently.Ongoing high lipid and cholesterol levels should be treated with medicines, if diet and exercise are not effective.  If you smoke, find out from your caregiver how to quit. If you do not use tobacco, do not start.  If you  choose to drink alcohol, do not exceed 2 drinks per day. One drink is considered to be 12 ounces (355 mL) of beer, 5 ounces (148 mL) of wine, or 1.5 ounces (44 mL) of liquor.  Avoid use of street drugs. Do not share needles with anyone. Ask for help if you need support or instructions about stopping the use of drugs.  High blood pressure causes heart disease and increases the risk of stroke. Blood pressure should be checked at least every 1 to 2 years. Ongoing high blood pressure should be treated with medicines if weight loss and exercise are not effective.  If you are 45 to 59 years old, ask your caregiver if you should take aspirin to prevent heart disease.  Diabetes screening involves taking a blood sample to check your fasting blood sugar level. This should be done once every 3 years, after age 45, if you are within normal weight and without risk factors for diabetes. Testing should be considered at a younger age or be carried out more frequently if you are overweight and have at least 1 risk factor for diabetes.  Colorectal cancer can be detected and often prevented. Most routine colorectal cancer screening begins at the age of 50 and continues through age 75. However, your caregiver may recommend screening at an earlier age if you have risk factors for colon cancer. On a yearly basis, your caregiver may provide home test kits to check for hidden blood in the stool. Use of a small camera at the end of a tube,   to directly examine the colon (sigmoidoscopy or colonoscopy), can detect the earliest forms of colorectal cancer. Talk to your caregiver about this at age 50, when routine screening begins. Direct examination of the colon should be repeated every 5 to 10 years through age 75, unless early forms of pre-cancerous polyps or small growths are found.  Hepatitis C blood testing is recommended for all people born from 1945 through 1965 and any individual with known risks for hepatitis C.  Healthy  men should no longer receive prostate-specific antigen (PSA) blood tests as part of routine cancer screening. Consult with your caregiver about prostate cancer screening.  Testicular cancer screening is not recommended for adolescents or adult males who have no symptoms. Screening includes self-exam, caregiver exam, and other screening tests. Consult with your caregiver about any symptoms you have or any concerns you have about testicular cancer.  Practice safe sex. Use condoms and avoid high-risk sexual practices to reduce the spread of sexually transmitted infections (STIs).  Use sunscreen with a sun protection factor (SPF) of 30 or greater. Apply sunscreen liberally and repeatedly throughout the day. You should seek shade when your shadow is shorter than you. Protect yourself by wearing long sleeves, pants, a wide-brimmed hat, and sunglasses year round, whenever you are outdoors.  Notify your caregiver of new moles or changes in moles, especially if there is a change in shape or color. Also notify your caregiver if a mole is larger than the size of a pencil eraser.  A one-time screening for abdominal aortic aneurysm (AAA) and surgical repair of large AAAs by sound wave imaging (ultrasonography) is recommended for ages 65 to 75 years who are current or former smokers.  Stay current with your immunizations. Document Released: 11/12/2007 Document Revised: 08/08/2011 Document Reviewed: 10/11/2010 [employer] Patient Information 2013 [employer].  

## 2012-09-07 NOTE — Assessment & Plan Note (Signed)
Based on ROS and exam today there has no clinical change and no complications He is doing well on his current meds for pain and spasms

## 2012-09-10 ENCOUNTER — Encounter: Payer: Self-pay | Admitting: Internal Medicine

## 2012-09-10 LAB — PSA: PSA: 1.57 ng/mL (ref 0.10–4.00)

## 2013-01-01 ENCOUNTER — Other Ambulatory Visit: Payer: Self-pay | Admitting: Internal Medicine

## 2013-01-02 ENCOUNTER — Telehealth: Payer: Self-pay | Admitting: Internal Medicine

## 2013-01-02 DIAGNOSIS — M25552 Pain in left hip: Secondary | ICD-10-CM

## 2013-01-02 DIAGNOSIS — M545 Low back pain, unspecified: Secondary | ICD-10-CM

## 2013-01-02 MED ORDER — HYDROCODONE-ACETAMINOPHEN 10-325 MG PO TABS: 1.0000 | ORAL_TABLET | Freq: Four times a day (QID) | ORAL | Status: DC | PRN

## 2013-01-02 MED ORDER — ALPRAZOLAM 1 MG PO TABS: 1.0000 mg | ORAL_TABLET | Freq: Three times a day (TID) | ORAL | Status: DC | PRN

## 2013-01-02 NOTE — Telephone Encounter (Signed)
Patient has a 6 month fu appointment scheduled 03/13/13, does he need a sooner appointment to get refills on his Norco and xanax?

## 2013-03-04 ENCOUNTER — Telehealth: Payer: Self-pay | Admitting: *Deleted

## 2013-03-04 ENCOUNTER — Ambulatory Visit (INDEPENDENT_AMBULATORY_CARE_PROVIDER_SITE_OTHER): Payer: PRIVATE HEALTH INSURANCE | Admitting: Internal Medicine

## 2013-03-04 ENCOUNTER — Encounter: Payer: Self-pay | Admitting: Internal Medicine

## 2013-03-04 VITALS — BP 120/82 | HR 85 | Temp 97.1°F | Resp 16 | Ht 72.0 in | Wt 160.0 lb

## 2013-03-04 DIAGNOSIS — L259 Unspecified contact dermatitis, unspecified cause: Secondary | ICD-10-CM

## 2013-03-04 DIAGNOSIS — M545 Low back pain, unspecified: Secondary | ICD-10-CM

## 2013-03-04 DIAGNOSIS — M25552 Pain in left hip: Secondary | ICD-10-CM

## 2013-03-04 DIAGNOSIS — M25559 Pain in unspecified hip: Secondary | ICD-10-CM

## 2013-03-04 DIAGNOSIS — I10 Essential (primary) hypertension: Secondary | ICD-10-CM

## 2013-03-04 MED ORDER — HYDROCODONE-ACETAMINOPHEN 10-325 MG PO TABS: 1.0000 | ORAL_TABLET | Freq: Four times a day (QID) | ORAL | Status: DC | PRN

## 2013-03-04 MED ORDER — TRIAMCINOLONE ACETONIDE 0.5 % EX CREA: TOPICAL_CREAM | Freq: Three times a day (TID) | CUTANEOUS | Status: DC

## 2013-03-04 MED ORDER — BETAMETHASONE VALERATE 0.12 % EX FOAM: 1.0000 | Freq: Two times a day (BID) | CUTANEOUS | Status: DC

## 2013-03-04 MED ORDER — ALPRAZOLAM 1 MG PO TABS: 1.0000 mg | ORAL_TABLET | Freq: Three times a day (TID) | ORAL | Status: DC | PRN

## 2013-03-04 NOTE — Patient Instructions (Signed)

## 2013-03-04 NOTE — Telephone Encounter (Signed)
Pt called requesting Xanax and Hydrocodone refill.  Please advise

## 2013-03-04 NOTE — Telephone Encounter (Signed)
Spoke with pts wife.  she states pt has appoint on 10.15.14.  Transferred to scheduling for earlier appoint.

## 2013-03-04 NOTE — Telephone Encounter (Signed)
He has to be seen 

## 2013-03-04 NOTE — Assessment & Plan Note (Signed)
He will use topical steroids for this

## 2013-03-04 NOTE — Assessment & Plan Note (Signed)
He will cont the current meds for pain No dangerous s/s noted today

## 2013-03-04 NOTE — Assessment & Plan Note (Signed)
His BP is well controlled 

## 2013-03-04 NOTE — Progress Notes (Signed)
Subjective:    Patient ID: Jonathan [name], male    DOB: 1955-11-16, 57 y.o.   MRN: [id number]  Rash This is a new problem. The current episode started in the past 7 days. The problem is unchanged. The affected locations include the scalp and groin. The rash is characterized by dryness, itchiness and scaling. He was exposed to plant contact. Pertinent negatives include no anorexia, congestion, cough, diarrhea, eye pain, facial edema, fatigue, fever, joint pain, nail changes, rhinorrhea, shortness of breath, sore throat or vomiting. Past treatments include anti-itch cream. The treatment provided mild relief.      Review of Systems  Constitutional: Negative.  Negative for fever, chills, diaphoresis, appetite change and fatigue.  HENT: Negative.  Negative for congestion, sore throat and rhinorrhea.   Eyes: Negative.  Negative for pain.  Respiratory: Negative.  Negative for cough, choking, shortness of breath, wheezing and stridor.   Cardiovascular: Negative.  Negative for chest pain, palpitations and leg swelling.  Gastrointestinal: Negative.  Negative for nausea, vomiting, abdominal pain, diarrhea, constipation and anorexia.  Endocrine: Negative.   Genitourinary: Negative.   Musculoskeletal: Positive for back pain. Negative for myalgias, joint pain, joint swelling, arthralgias and gait problem.       He has chronic,unchanged LBP. The pain does not radiate and he has no N/W/T in his legs. There are times when he feels a spasm and he takes xanax and the spasm is better.  Skin: Positive for rash. Negative for nail changes, color change, pallor and wound.  Allergic/Immunologic: Negative.   Neurological: Negative.  Negative for dizziness, tremors, weakness, light-headedness and numbness.  Hematological: Negative.  Negative for adenopathy. Does not bruise/bleed easily.  Psychiatric/Behavioral: Negative.        Objective:   Physical Exam  Vitals reviewed. Constitutional: He is oriented to  person, place, and time. He appears well-developed and well-nourished. No distress.  HENT:  Head: Normocephalic and atraumatic.  Mouth/Throat: Oropharynx is clear and moist. No oropharyngeal exudate.  Eyes: Conjunctivae are normal. Right eye exhibits no discharge. Left eye exhibits no discharge. No scleral icterus.  Neck: Normal range of motion. Neck supple. No JVD present. No tracheal deviation present. No thyromegaly present.  Cardiovascular: Normal rate, regular rhythm, normal heart sounds and intact distal pulses.  Exam reveals no gallop and no friction rub.   No murmur heard. Pulmonary/Chest: Effort normal and breath sounds normal. No stridor. No respiratory distress. He has no wheezes. He has no rales. He exhibits no tenderness.  Abdominal: Soft. Bowel sounds are normal. He exhibits no distension and no mass. There is no tenderness. There is no rebound and no guarding.  Musculoskeletal: Normal range of motion. He exhibits no edema and no tenderness.       Lumbar back: Normal. He exhibits normal range of motion, no tenderness, no bony tenderness, no swelling, no edema, no deformity, no laceration, no pain, no spasm and normal pulse.  Lymphadenopathy:    He has no cervical adenopathy.  Neurological: He is alert and oriented to person, place, and time. He has normal strength. He displays no atrophy, no tremor and normal reflexes. No cranial nerve deficit or sensory deficit. He exhibits normal muscle tone. He displays a negative Romberg sign. He displays no seizure activity. Coordination and gait normal.  Reflex Scores:      Tricep reflexes are 1+ on the right side and 1+ on the left side.      Bicep reflexes are 1+ on the right side and 1+ on the  left side.      Brachioradialis reflexes are 1+ on the right side and 1+ on the left side.      Patellar reflexes are 1+ on the right side and 1+ on the left side.      Achilles reflexes are 1+ on the right side and 1+ on the left side. Neg SLR in  BLE  Skin: Skin is warm, dry and intact. Rash noted. No purpura noted. Rash is papular. Rash is not macular, not maculopapular, not nodular, not pustular, not vesicular and not urticarial. He is not diaphoretic. No erythema. No pallor.  There are scattered, scaly papules on his scalp and in his groin over the scrotum and in the intertriginous areas.     Lab Results  Component Value Date   WBC 3.6* 09/07/2012   HGB 15.7 09/07/2012   HCT 46.9 09/07/2012   PLT 253.0 09/07/2012   GLUCOSE 87 09/07/2012   CHOL 194 09/07/2012   TRIG 97.0 09/07/2012   HDL 60.00 09/07/2012   LDLDIRECT 131.6 07/14/2011   LDLCALC 115* 09/07/2012   ALT 18 09/07/2012   AST 16 09/07/2012   NA 135 09/07/2012   K 3.7 09/07/2012   CL 101 09/07/2012   CREATININE 0.8 09/07/2012   BUN 7 09/07/2012   CO2 28 09/07/2012   TSH 0.65 07/14/2011   PSA 1.57 09/07/2012       Assessment & Plan:

## 2013-03-13 ENCOUNTER — Ambulatory Visit: Payer: PRIVATE HEALTH INSURANCE | Admitting: Internal Medicine

## 2013-05-16 ENCOUNTER — Ambulatory Visit: Payer: PRIVATE HEALTH INSURANCE | Admitting: Internal Medicine

## 2013-05-16 ENCOUNTER — Ambulatory Visit (INDEPENDENT_AMBULATORY_CARE_PROVIDER_SITE_OTHER): Payer: PRIVATE HEALTH INSURANCE | Admitting: Internal Medicine

## 2013-05-16 ENCOUNTER — Encounter: Payer: Self-pay | Admitting: Internal Medicine

## 2013-05-16 VITALS — BP 118/88 | HR 49 | Temp 97.8°F | Resp 16 | Ht 72.0 in | Wt 158.0 lb

## 2013-05-16 DIAGNOSIS — M545 Low back pain, unspecified: Secondary | ICD-10-CM

## 2013-05-16 DIAGNOSIS — M25552 Pain in left hip: Secondary | ICD-10-CM

## 2013-05-16 DIAGNOSIS — F411 Generalized anxiety disorder: Secondary | ICD-10-CM | POA: Insufficient documentation

## 2013-05-16 DIAGNOSIS — M25559 Pain in unspecified hip: Secondary | ICD-10-CM

## 2013-05-16 MED ORDER — ALPRAZOLAM 1 MG PO TABS: 1.0000 mg | ORAL_TABLET | Freq: Three times a day (TID) | ORAL | Status: DC | PRN

## 2013-05-16 MED ORDER — HYDROCODONE-ACETAMINOPHEN 10-325 MG PO TABS: 1.0000 | ORAL_TABLET | Freq: Four times a day (QID) | ORAL | Status: DC | PRN

## 2013-05-16 NOTE — Progress Notes (Signed)
Subjective:    Patient ID: Jonathan [name], male    DOB: Feb 09, 1956, 57 y.o.   MRN: [id number]  Back Pain This is a chronic problem. The current episode started more than 1 year ago. The problem is unchanged. The quality of the pain is described as aching. The pain does not radiate. The pain is at a severity of 6/10. The pain is moderate. The pain is worse during the day. The symptoms are aggravated by position and bending. Pertinent negatives include no abdominal pain, bladder incontinence, bowel incontinence, chest pain, dysuria, fever, headaches, leg pain, numbness, paresis, paresthesias, pelvic pain, perianal numbness, tingling, weakness or weight loss. He has tried analgesics and NSAIDs for the symptoms. The treatment provided significant relief.      Review of Systems  Constitutional: Negative.  Negative for fever, chills, weight loss, diaphoresis, appetite change and fatigue.  HENT: Negative.   Eyes: Negative.   Respiratory: Negative.  Negative for cough, chest tightness, shortness of breath, wheezing and stridor.   Cardiovascular: Negative.  Negative for chest pain, palpitations and leg swelling.  Gastrointestinal: Negative.  Negative for abdominal pain and bowel incontinence.  Endocrine: Negative.   Genitourinary: Negative.  Negative for bladder incontinence, dysuria and pelvic pain.  Musculoskeletal: Positive for back pain. Negative for arthralgias, gait problem, joint swelling, myalgias, neck pain and neck stiffness.  Skin: Negative.   Allergic/Immunologic: Negative.   Neurological: Negative.  Negative for tingling, weakness, numbness, headaches and paresthesias.  Hematological: Negative.  Negative for adenopathy. Does not bruise/bleed easily.  Psychiatric/Behavioral: Positive for sleep disturbance. Negative for suicidal ideas, hallucinations, behavioral problems, confusion, self-injury, dysphoric mood and decreased concentration. The patient is nervous/anxious. The patient is  not hyperactive.        Objective:   Physical Exam  Vitals reviewed. Constitutional: He is oriented to person, place, and time. He appears well-developed and well-nourished. No distress.  HENT:  Head: Normocephalic and atraumatic.  Mouth/Throat: Oropharynx is clear and moist. No oropharyngeal exudate.  Eyes: Conjunctivae are normal. Right eye exhibits no discharge. Left eye exhibits no discharge. No scleral icterus.  Neck: Normal range of motion. Neck supple. No JVD present. No tracheal deviation present. No thyromegaly present.  Cardiovascular: Normal rate, regular rhythm, normal heart sounds and intact distal pulses.  Exam reveals no gallop and no friction rub.   No murmur heard. Pulmonary/Chest: Effort normal and breath sounds normal. No stridor. No respiratory distress. He has no wheezes. He has no rales. He exhibits no tenderness.  Abdominal: Soft. Bowel sounds are normal. He exhibits no distension and no mass. There is no tenderness. There is no rebound and no guarding.  Musculoskeletal: Normal range of motion. He exhibits no edema and no tenderness.       Lumbar back: Normal. He exhibits normal range of motion, no tenderness, no bony tenderness, no swelling, no edema, no deformity, no laceration, no pain, no spasm and normal pulse.  Lymphadenopathy:    He has no cervical adenopathy.  Neurological: He is alert and oriented to person, place, and time. He has normal strength and normal reflexes. He displays no atrophy and no tremor. No cranial nerve deficit or sensory deficit. He exhibits normal muscle tone. He displays a negative Romberg sign. He displays no seizure activity. Coordination and gait normal.  Neg SLR in BLE  Skin: Skin is warm and dry. No rash noted. He is not diaphoretic. No erythema. No pallor.     Lab Results  Component Value Date   WBC  3.6* 09/07/2012   HGB 15.7 09/07/2012   HCT 46.9 09/07/2012   PLT 253.0 09/07/2012   GLUCOSE 87 09/07/2012   CHOL 194 09/07/2012    TRIG 97.0 09/07/2012   HDL 60.00 09/07/2012   LDLDIRECT 131.6 07/14/2011   LDLCALC 115* 09/07/2012   ALT 18 09/07/2012   AST 16 09/07/2012   NA 135 09/07/2012   K 3.7 09/07/2012   CL 101 09/07/2012   CREATININE 0.8 09/07/2012   BUN 7 09/07/2012   CO2 28 09/07/2012   TSH 0.65 07/14/2011   PSA 1.57 09/07/2012       Assessment & Plan:

## 2013-05-16 NOTE — Progress Notes (Signed)
Pre visit review using our clinic review tool, if applicable. No additional management support is needed unless otherwise documented below in the visit note. 

## 2013-05-19 NOTE — Assessment & Plan Note (Signed)
He is not willing to take an SSRI Will cont the BZD as needed

## 2013-05-19 NOTE — Assessment & Plan Note (Signed)
He is getting adequate relief from his pain with norco - will cont that for now

## 2013-06-14 ENCOUNTER — Ambulatory Visit (INDEPENDENT_AMBULATORY_CARE_PROVIDER_SITE_OTHER): Payer: PRIVATE HEALTH INSURANCE | Admitting: Internal Medicine

## 2013-06-14 ENCOUNTER — Encounter: Payer: Self-pay | Admitting: Internal Medicine

## 2013-06-14 VITALS — BP 142/82 | HR 67 | Temp 97.8°F | Resp 16 | Wt 161.0 lb

## 2013-06-14 DIAGNOSIS — M545 Low back pain, unspecified: Secondary | ICD-10-CM

## 2013-06-14 DIAGNOSIS — I10 Essential (primary) hypertension: Secondary | ICD-10-CM

## 2013-06-14 DIAGNOSIS — J309 Allergic rhinitis, unspecified: Secondary | ICD-10-CM | POA: Insufficient documentation

## 2013-06-14 DIAGNOSIS — F411 Generalized anxiety disorder: Secondary | ICD-10-CM

## 2013-06-14 MED ORDER — ALPRAZOLAM 1 MG PO TABS: 1.0000 mg | ORAL_TABLET | Freq: Three times a day (TID) | ORAL | Status: DC | PRN

## 2013-06-14 MED ORDER — IBUPROFEN 800 MG PO TABS: 800.0000 mg | ORAL_TABLET | Freq: Three times a day (TID) | ORAL | Status: DC | PRN

## 2013-06-14 MED ORDER — HYDROCODONE-ACETAMINOPHEN 10-325 MG PO TABS: 1.0000 | ORAL_TABLET | Freq: Four times a day (QID) | ORAL | Status: DC | PRN

## 2013-06-14 MED ORDER — CETIRIZINE HCL 10 MG PO TABS: 10.0000 mg | ORAL_TABLET | Freq: Every day | ORAL | Status: DC

## 2013-06-14 NOTE — Patient Instructions (Signed)
Back Pain, Adult Low back pain is very common. About 1 in 5 people have back pain.The cause of low back pain is rarely dangerous. The pain often gets better over time.About half of people with a sudden onset of back pain feel better in just 2 weeks. About 8 in 10 people feel better by 6 weeks.  CAUSES Some common causes of back pain include:  Strain of the muscles or ligaments supporting the spine.  Wear and tear (degeneration) of the spinal discs.  Arthritis.  Direct injury to the back. DIAGNOSIS Most of the time, the direct cause of low back pain is not known.However, back pain can be treated effectively even when the exact cause of the pain is unknown.Answering your caregiver's questions about your overall health and symptoms is one of the most accurate ways to make sure the cause of your pain is not dangerous. If your caregiver needs more information, he or she may order lab work or imaging tests (X-rays or MRIs).However, even if imaging tests show changes in your back, this usually does not require surgery. HOME CARE INSTRUCTIONS For many people, back pain returns.Since low back pain is rarely dangerous, it is often a condition that people can learn to manageon their own.   Remain active. It is stressful on the back to sit or stand in one place. Do not sit, drive, or stand in one place for more than 30 minutes at a time. Take short walks on level surfaces as soon as pain allows.Try to increase the length of time you walk each day.  Do not stay in bed.Resting more than 1 or 2 days can delay your recovery.  Do not avoid exercise or work.Your body is made to move.It is not dangerous to be active, even though your back may hurt.Your back will likely heal faster if you return to being active before your pain is gone.  Pay attention to your body when you bend and lift. Many people have less discomfortwhen lifting if they bend their knees, keep the load close to their bodies,and  avoid twisting. Often, the most comfortable positions are those that put less stress on your recovering back.  Find a comfortable position to sleep. Use a firm mattress and lie on your side with your knees slightly bent. If you lie on your back, put a pillow under your knees.  Only take over-the-counter or prescription medicines as directed by your caregiver. Over-the-counter medicines to reduce pain and inflammation are often the most helpful.Your caregiver may prescribe muscle relaxant drugs.These medicines help dull your pain so you can more quickly return to your normal activities and healthy exercise.  Put ice on the injured area.  Put ice in a plastic bag.  Place a towel between your skin and the bag.  Leave the ice on for 15-20 minutes, 03-04 times a day for the first 2 to 3 days. After that, ice and heat may be alternated to reduce pain and spasms.  Ask your caregiver about trying back exercises and gentle massage. This may be of some benefit.  Avoid feeling anxious or stressed.Stress increases muscle tension and can worsen back pain.It is important to recognize when you are anxious or stressed and learn ways to manage it.Exercise is a great option. SEEK MEDICAL CARE IF:  You have pain that is not relieved with rest or medicine.  You have pain that does not improve in 1 week.  You have new symptoms.  You are generally not feeling well. SEEK   IMMEDIATE MEDICAL CARE IF:   You have pain that radiates from your back into your legs.  You develop new bowel or bladder control problems.  You have unusual weakness or numbness in your arms or legs.  You develop nausea or vomiting.  You develop abdominal pain.  You feel faint. Document Released: 05/16/2005 Document Revised: 11/15/2011 Document Reviewed: 10/04/2010 [employer] Patient Information 2014 [employer].  

## 2013-06-14 NOTE — Progress Notes (Signed)
Pre visit review using our clinic review tool, if applicable. No additional management support is needed unless otherwise documented below in the visit note. 

## 2013-06-16 NOTE — Assessment & Plan Note (Signed)
His BP is well controlled 

## 2013-06-16 NOTE — Progress Notes (Signed)
Subjective:    Patient ID: Jonathan [name], male    DOB: 07-May-1956, 58 y.o.   MRN: [id number]  Back Pain This is a chronic problem. The current episode started more than 1 year ago. The problem occurs constantly. The problem has been gradually worsening since onset. The pain is present in the lumbar spine. The quality of the pain is described as aching. The pain radiates to the left thigh. The pain is at a severity of 6/10. The pain is moderate. The symptoms are aggravated by bending and position. Associated symptoms include numbness and paresthesias (N and T in his LLE). Pertinent negatives include no abdominal pain, bladder incontinence, bowel incontinence, chest pain, dysuria, fever, headaches, leg pain, paresis, pelvic pain, perianal numbness, tingling, weakness or weight loss. He has tried analgesics, NSAIDs, home exercises and muscle relaxant for the symptoms. The treatment provided moderate relief.      Review of Systems  Constitutional: Negative.  Negative for fever, chills, weight loss, diaphoresis, appetite change and fatigue.  HENT: Positive for congestion, postnasal drip and rhinorrhea. Negative for dental problem, drooling, ear discharge, ear pain, facial swelling, hearing loss, mouth sores, nosebleeds, sinus pressure, sneezing, sore throat, tinnitus, trouble swallowing and voice change.   Eyes: Negative.   Respiratory: Negative.  Negative for cough, chest tightness, shortness of breath, wheezing and stridor.   Cardiovascular: Negative.  Negative for chest pain, palpitations and leg swelling.  Gastrointestinal: Negative.  Negative for nausea, vomiting, abdominal pain, diarrhea, constipation, blood in stool and bowel incontinence.  Endocrine: Negative.   Genitourinary: Negative.  Negative for bladder incontinence, dysuria and pelvic pain.  Musculoskeletal: Positive for back pain. Negative for arthralgias, gait problem, joint swelling, myalgias, neck pain and neck stiffness.    Skin: Negative.   Allergic/Immunologic: Negative.   Neurological: Positive for numbness and paresthesias (N and T in his LLE). Negative for dizziness, tingling, tremors, seizures, syncope, facial asymmetry, speech difficulty, weakness, light-headedness and headaches.  Hematological: Negative.   Psychiatric/Behavioral: Negative.        Objective:   Physical Exam  Vitals reviewed. Constitutional: He is oriented to person, place, and time. He appears well-developed and well-nourished. No distress.  HENT:  Head: Normocephalic and atraumatic.  Nose: No mucosal edema, rhinorrhea, sinus tenderness or nasal deformity. No epistaxis. Right sinus exhibits no maxillary sinus tenderness and no frontal sinus tenderness. Left sinus exhibits no maxillary sinus tenderness and no frontal sinus tenderness.  Mouth/Throat: Oropharynx is clear and moist. No oropharyngeal exudate.  Eyes: Conjunctivae are normal. Right eye exhibits no discharge. Left eye exhibits no discharge. No scleral icterus.  Neck: Normal range of motion. Neck supple. No JVD present. No tracheal deviation present. No thyromegaly present.  Cardiovascular: Normal rate, regular rhythm, normal heart sounds and intact distal pulses.  Exam reveals no gallop and no friction rub.   No murmur heard. Pulmonary/Chest: Effort normal and breath sounds normal. No stridor. No respiratory distress. He has no wheezes. He has no rales. He exhibits no tenderness.  Abdominal: Soft. Bowel sounds are normal. He exhibits no distension and no mass. There is no tenderness. There is no rebound and no guarding.  Musculoskeletal: Normal range of motion. He exhibits no edema and no tenderness.       Lumbar back: Normal. He exhibits normal range of motion, no tenderness, no bony tenderness, no swelling, no edema, no deformity, no laceration, no pain, no spasm and normal pulse.  Lymphadenopathy:    He has no cervical adenopathy.  Neurological: He  is oriented to person,  place, and time. He has normal strength and normal reflexes. He displays no atrophy and no tremor. No cranial nerve deficit or sensory deficit. He exhibits normal muscle tone. He displays a negative Romberg sign. He displays no seizure activity. Coordination and gait normal.  Neg SLE in BLE  Skin: Skin is warm and dry. No rash noted. He is not diaphoretic. No erythema. No pallor.  Psychiatric: He has a normal mood and affect. His behavior is normal. Judgment and thought content normal.          Assessment & Plan:

## 2013-06-16 NOTE — Assessment & Plan Note (Signed)
He will try zyrtec for this

## 2013-06-16 NOTE — Assessment & Plan Note (Signed)
He has some worsening s/s so I have asked him to get an MRI done For now, he will continue the current meds without any changes

## 2013-07-15 ENCOUNTER — Ambulatory Visit: Payer: PRIVATE HEALTH INSURANCE | Admitting: Internal Medicine

## 2013-08-12 ENCOUNTER — Other Ambulatory Visit: Payer: Self-pay | Admitting: Internal Medicine

## 2013-10-11 ENCOUNTER — Ambulatory Visit: Payer: PRIVATE HEALTH INSURANCE | Admitting: Internal Medicine

## 2013-10-11 ENCOUNTER — Other Ambulatory Visit: Payer: Self-pay

## 2013-10-11 ENCOUNTER — Telehealth: Payer: Self-pay | Admitting: Internal Medicine

## 2013-10-11 DIAGNOSIS — M545 Low back pain, unspecified: Secondary | ICD-10-CM

## 2013-10-11 DIAGNOSIS — F411 Generalized anxiety disorder: Secondary | ICD-10-CM

## 2013-10-11 MED ORDER — HYDROCODONE-ACETAMINOPHEN 10-325 MG PO TABS: 1.0000 | ORAL_TABLET | Freq: Four times a day (QID) | ORAL | Status: DC | PRN

## 2013-10-11 MED ORDER — ALPRAZOLAM 1 MG PO TABS: 1.0000 mg | ORAL_TABLET | Freq: Three times a day (TID) | ORAL | Status: DC | PRN

## 2013-10-11 NOTE — Telephone Encounter (Signed)
Patient states that he was told to have an MRI for his back at last OV. Patient needs order for MRI if he still needs to have. Please advise.

## 2013-10-11 NOTE — Telephone Encounter (Signed)
Patient showed up late to appointment, unable to be seen. MD approved one month of xanax and hydrocodone.

## 2013-10-12 ENCOUNTER — Other Ambulatory Visit: Payer: Self-pay | Admitting: Internal Medicine

## 2013-10-12 DIAGNOSIS — M545 Low back pain, unspecified: Secondary | ICD-10-CM

## 2013-10-12 NOTE — Telephone Encounter (Signed)
MRI ordered

## 2013-10-14 NOTE — Telephone Encounter (Signed)
Returned call to patient, caregiver notified

## 2013-11-05 ENCOUNTER — Ambulatory Visit: Payer: PRIVATE HEALTH INSURANCE | Admitting: Internal Medicine

## 2013-11-05 ENCOUNTER — Ambulatory Visit
Admission: RE | Admit: 2013-11-05 | Discharge: 2013-11-05 | Disposition: A | Discharge status: Home / Self Care | Payer: PRIVATE HEALTH INSURANCE | Source: Ambulatory Visit | Attending: Internal Medicine | Admitting: Internal Medicine

## 2013-11-05 DIAGNOSIS — M545 Low back pain, unspecified: Secondary | ICD-10-CM

## 2013-11-07 ENCOUNTER — Ambulatory Visit (INDEPENDENT_AMBULATORY_CARE_PROVIDER_SITE_OTHER): Payer: PRIVATE HEALTH INSURANCE | Admitting: Internal Medicine

## 2013-11-07 ENCOUNTER — Encounter: Payer: Self-pay | Admitting: Internal Medicine

## 2013-11-07 VITALS — BP 110/68 | HR 58 | Temp 98.5°F | Resp 16 | Ht 72.0 in | Wt 155.2 lb

## 2013-11-07 DIAGNOSIS — M545 Low back pain, unspecified: Secondary | ICD-10-CM

## 2013-11-07 DIAGNOSIS — F411 Generalized anxiety disorder: Secondary | ICD-10-CM

## 2013-11-07 DIAGNOSIS — F17200 Nicotine dependence, unspecified, uncomplicated: Secondary | ICD-10-CM

## 2013-11-07 DIAGNOSIS — N401 Enlarged prostate with lower urinary tract symptoms: Secondary | ICD-10-CM

## 2013-11-07 DIAGNOSIS — N138 Other obstructive and reflux uropathy: Secondary | ICD-10-CM

## 2013-11-07 DIAGNOSIS — N410 Acute prostatitis: Secondary | ICD-10-CM

## 2013-11-07 MED ORDER — ALPRAZOLAM 1 MG PO TABS: 1.0000 mg | ORAL_TABLET | Freq: Three times a day (TID) | ORAL | Status: DC | PRN

## 2013-11-07 MED ORDER — HYDROCODONE-ACETAMINOPHEN 10-325 MG PO TABS: 1.0000 | ORAL_TABLET | Freq: Four times a day (QID) | ORAL | Status: DC | PRN

## 2013-11-07 MED ORDER — CIPROFLOXACIN HCL 500 MG PO TABS: 500.0000 mg | ORAL_TABLET | Freq: Two times a day (BID) | ORAL | Status: DC

## 2013-11-07 MED ORDER — TAMSULOSIN HCL 0.4 MG PO CAPS: 0.4000 mg | ORAL_CAPSULE | Freq: Every day | ORAL | Status: DC

## 2013-11-07 NOTE — Assessment & Plan Note (Signed)
MRI appears stable/improved Will cont norco as needed for pain

## 2013-11-07 NOTE — Assessment & Plan Note (Signed)
He will cont xanax as needed UDS today

## 2013-11-07 NOTE — Assessment & Plan Note (Signed)
Will treat the infection with cipro

## 2013-11-07 NOTE — Progress Notes (Signed)
Pre visit review using our clinic review tool, if applicable. No additional management support is needed unless otherwise documented below in the visit note. 

## 2013-11-07 NOTE — Progress Notes (Signed)
Subjective:    Patient ID: Jonathan [name], male    DOB: 1955-08-10, 58 y.o.   MRN: [id number]  Benign Prostatic Hypertrophy This is a recurrent problem. The problem has been gradually worsening since onset. Irritative symptoms include urgency. Irritative symptoms do not include frequency or nocturia. Obstructive symptoms include dribbling, incomplete emptying, an intermittent stream, a slower stream, straining and a weak stream. Pertinent negatives include no chills, dysuria, genital pain, hematuria, hesitancy, nausea or vomiting. AUA score is 0-7. He is sexually active. Nothing aggravates the symptoms. Past treatments include nothing. The treatment provided no relief.      Review of Systems  Constitutional: Negative.  Negative for fever, chills, diaphoresis, appetite change and fatigue.  HENT: Negative.   Eyes: Negative.   Respiratory: Negative.  Negative for cough, choking, chest tightness, shortness of breath and stridor.   Cardiovascular: Negative.  Negative for chest pain, palpitations and leg swelling.  Gastrointestinal: Negative.  Negative for nausea, vomiting and abdominal pain.  Endocrine: Negative.   Genitourinary: Positive for urgency, difficulty urinating and incomplete emptying. Negative for dysuria, hesitancy, frequency, hematuria, flank pain, decreased urine volume, penile swelling, scrotal swelling, enuresis, genital sores, penile pain, testicular pain and nocturia.  Musculoskeletal: Positive for back pain. Negative for arthralgias, gait problem, joint swelling, myalgias, neck pain and neck stiffness.  Skin: Negative.   Allergic/Immunologic: Negative.   Neurological: Negative.   Hematological: Negative.  Negative for adenopathy. Does not bruise/bleed easily.  Psychiatric/Behavioral: Negative.        Objective:   Physical Exam  Vitals reviewed. Constitutional: He is oriented to person, place, and time. He appears well-developed and well-nourished. No distress.    HENT:  Head: Normocephalic and atraumatic.  Mouth/Throat: Oropharynx is clear and moist. No oropharyngeal exudate.  Eyes: Conjunctivae are normal. Right eye exhibits no discharge. Left eye exhibits no discharge. No scleral icterus.  Neck: Normal range of motion. Neck supple. No JVD present. No tracheal deviation present. No thyromegaly present.  Cardiovascular: Normal rate, regular rhythm, normal heart sounds and intact distal pulses.  Exam reveals no gallop and no friction rub.   No murmur heard. Pulmonary/Chest: Effort normal and breath sounds normal. No stridor. No respiratory distress. He has no wheezes. He has no rales. He exhibits no tenderness.  Abdominal: Soft. Bowel sounds are normal. He exhibits no distension and no mass. There is no tenderness. There is no rebound and no guarding. Hernia confirmed negative in the right inguinal area and confirmed negative in the left inguinal area.  Genitourinary: Rectum normal, testes normal and penis normal. Rectal exam shows no external hemorrhoid, no internal hemorrhoid, no fissure, no mass, no tenderness and anal tone normal. Guaiac negative stool. Prostate is enlarged (2+ BPH rt lobe larger than lt lobe) and tender (rt lobe is boggy and tender). Right testis shows no mass, no swelling and no tenderness. Right testis is descended. Left testis shows no mass, no swelling and no tenderness. Left testis is descended. Circumcised. No penile erythema or penile tenderness. No discharge found.  Musculoskeletal: Normal range of motion. He exhibits no edema and no tenderness.  Lymphadenopathy:    He has no cervical adenopathy.       Right: No inguinal adenopathy present.       Left: No inguinal adenopathy present.  Neurological: He is oriented to person, place, and time.  Skin: Skin is warm and dry. No rash noted. He is not diaphoretic. No erythema. No pallor.  Psychiatric: He has a normal mood and  affect. His behavior is normal. Judgment and thought content  normal.     Lab Results  Component Value Date   WBC 3.6* 09/07/2012   HGB 15.7 09/07/2012   HCT 46.9 09/07/2012   PLT 253.0 09/07/2012   GLUCOSE 87 09/07/2012   CHOL 194 09/07/2012   TRIG 97.0 09/07/2012   HDL 60.00 09/07/2012   LDLDIRECT 131.6 07/14/2011   LDLCALC 115* 09/07/2012   ALT 18 09/07/2012   AST 16 09/07/2012   NA 135 09/07/2012   K 3.7 09/07/2012   CL 101 09/07/2012   CREATININE 0.8 09/07/2012   BUN 7 09/07/2012   CO2 28 09/07/2012   TSH 0.65 07/14/2011   PSA 1.57 09/07/2012       Assessment & Plan:

## 2013-11-07 NOTE — Assessment & Plan Note (Signed)
Will try flomax to help with symptom relief

## 2013-11-07 NOTE — Patient Instructions (Signed)
Prostatitis The prostate gland is about the size and shape of a walnut. It is located just below your bladder. It produces one of the components of semen, which is made up of sperm and the fluids that help nourish and transport it out from the testicles. Prostatitis is inflammation of the prostate gland.  There are four types of prostatitis:  Acute bacterial prostatitis This is the least common type of prostatitis. It starts quickly and usually is associated with a bladder infection, high fever, and shaking chills. It can occur at any age.  Chronic bacterial prostatitis This is a persistent bacterial infection in the prostate. It usually develops from repeated acute bacterial prostatitis or acute bacterial prostatitis that was not properly treated. It can occur in men of any age but is most common in middle-aged men whose prostate has begun to enlarge. The symptoms are not as severe as those in acute bacterial prostatitis. Discomfort in the part of your body that is in front of your rectum and below your scrotum (perineum), lower abdomen, or in the head of your penis (glans) may represent your primary discomfort.  Chronic prostatitis (nonbacterial) This is the most common type of prostatitis. It is inflammation of the prostate gland that is not caused by a bacterial infection. The cause is unknown and may be associated with a viral infection or autoimmune disorder.  Prostatodynia (pelvic floor disorder) This is associated with increased muscular tone in the pelvis surrounding the prostate. CAUSES The causes of bacterial prostatitis are bacterial infection. The causes of the other types of prostatitis are unknown.  SYMPTOMS  Symptoms can vary depending upon the type of prostatitis that exists. There can also be overlap in symptoms. Possible symptoms for each type of prostatitis are listed below. Acute Bacterial Prostatitis  Painful urination.  Fever or chills.  Muscle or joint pains.  Low  back pain.  Low abdominal pain.  Inability to empty bladder completely. Chronic Bacterial Prostatitis, Chronic Nonbacterial Prostatitis, and Prostatodynia  Sudden urge to urinate.  Frequent urination.  Difficulty starting urine stream.  Weak urine stream.  Discharge from the urethra.  Dribbling after urination.  Rectal pain.  Pain in the testicles, penis, or tip of the penis.  Pain in the perineum.  Problems with sexual function.  Painful ejaculation.  Bloody semen. DIAGNOSIS  In order to diagnose prostatitis, your health care provider will ask about your symptoms. One or more urine samples will be taken and tested (urinalysis). If the urinalysis result is negative for bacteria, your health care provider may use a finger to feel your prostate (digital rectal exam). This exam helps your health care provider determine if your prostate is swollen and tender. It will also produce a specimen of semen that can be analyzed. TREATMENT  Treatment for prostatitis depends on the cause. If a bacterial infection is the cause, it can be treated with antibiotic medicine. In cases of chronic bacterial prostatitis, the use of antibiotics for up to 1 month or 6 weeks may be necessary. Your health care provider may instruct you to take sitz baths to help relieve pain. A sitz bath is a bath of hot water in which your hips and buttocks are under water. This relaxes the pelvic floor muscles and often helps to relieve the pressure on your prostate. HOME CARE INSTRUCTIONS   Take all medicines as directed by your health care provider.  Take sitz baths as directed by your health care provider. SEEK MEDICAL CARE IF:   Your symptoms   get worse, not better.  You have a fever. SEEK IMMEDIATE MEDICAL CARE IF:   You have chills.  You feel nauseous or vomit.  You feel lightheaded or faint.  You are unable to urinate.  You have blood or blood clots in your urine. Document Released: 05/13/2000  Document Revised: 03/06/2013 Document Reviewed: 12/03/2012 [employer] Patient Information 2014 [employer].  

## 2014-01-02 ENCOUNTER — Telehealth: Payer: Self-pay | Admitting: Internal Medicine

## 2014-01-02 DIAGNOSIS — F411 Generalized anxiety disorder: Secondary | ICD-10-CM

## 2014-01-02 DIAGNOSIS — M545 Low back pain, unspecified: Secondary | ICD-10-CM

## 2014-01-02 NOTE — Telephone Encounter (Signed)
Patient has med fu appt for 08/14.  He is requesting hydrocodone and alprazolan to get through until then.

## 2014-01-03 MED ORDER — HYDROCODONE-ACETAMINOPHEN 10-325 MG PO TABS: 1.0000 | ORAL_TABLET | Freq: Four times a day (QID) | ORAL | Status: DC | PRN

## 2014-01-03 MED ORDER — ALPRAZOLAM 1 MG PO TABS: 1.0000 mg | ORAL_TABLET | Freq: Three times a day (TID) | ORAL | Status: DC | PRN

## 2014-01-03 NOTE — Telephone Encounter (Signed)
done

## 2014-01-03 NOTE — Telephone Encounter (Signed)
Pt notified//[email or web address] 

## 2014-01-10 ENCOUNTER — Ambulatory Visit (INDEPENDENT_AMBULATORY_CARE_PROVIDER_SITE_OTHER): Payer: PRIVATE HEALTH INSURANCE | Admitting: Internal Medicine

## 2014-01-10 ENCOUNTER — Encounter: Payer: Self-pay | Admitting: Internal Medicine

## 2014-01-10 VITALS — BP 122/78 | HR 60 | Temp 97.6°F | Resp 16 | Ht 72.0 in | Wt 162.8 lb

## 2014-01-10 DIAGNOSIS — M48061 Spinal stenosis, lumbar region without neurogenic claudication: Secondary | ICD-10-CM

## 2014-01-10 DIAGNOSIS — I10 Essential (primary) hypertension: Secondary | ICD-10-CM

## 2014-01-10 DIAGNOSIS — M545 Low back pain, unspecified: Secondary | ICD-10-CM

## 2014-01-10 DIAGNOSIS — F411 Generalized anxiety disorder: Secondary | ICD-10-CM

## 2014-01-10 MED ORDER — HYDROCODONE-ACETAMINOPHEN 10-325 MG PO TABS: 1.0000 | ORAL_TABLET | Freq: Four times a day (QID) | ORAL | Status: DC | PRN

## 2014-01-10 MED ORDER — ALPRAZOLAM 1 MG PO TABS: 1.0000 mg | ORAL_TABLET | Freq: Three times a day (TID) | ORAL | Status: DC | PRN

## 2014-01-10 NOTE — Patient Instructions (Signed)
Back Pain, Adult Low back pain is very common. About 1 in 5 people have back pain.The cause of low back pain is rarely dangerous. The pain often gets better over time.About half of people with a sudden onset of back pain feel better in just 2 weeks. About 8 in 10 people feel better by 6 weeks.  CAUSES Some common causes of back pain include:  Strain of the muscles or ligaments supporting the spine.  Wear and tear (degeneration) of the spinal discs.  Arthritis.  Direct injury to the back. DIAGNOSIS Most of the time, the direct cause of low back pain is not known.However, back pain can be treated effectively even when the exact cause of the pain is unknown.Answering your caregiver's questions about your overall health and symptoms is one of the most accurate ways to make sure the cause of your pain is not dangerous. If your caregiver needs more information, he or she may order lab work or imaging tests (X-rays or MRIs).However, even if imaging tests show changes in your back, this usually does not require surgery. HOME CARE INSTRUCTIONS For many people, back pain returns.Since low back pain is rarely dangerous, it is often a condition that people can learn to manageon their own.   Remain active. It is stressful on the back to sit or stand in one place. Do not sit, drive, or stand in one place for more than 30 minutes at a time. Take short walks on level surfaces as soon as pain allows.Try to increase the length of time you walk each day.  Do not stay in bed.Resting more than 1 or 2 days can delay your recovery.  Do not avoid exercise or work.Your body is made to move.It is not dangerous to be active, even though your back may hurt.Your back will likely heal faster if you return to being active before your pain is gone.  Pay attention to your body when you bend and lift. Many people have less discomfortwhen lifting if they bend their knees, keep the load close to their bodies,and  avoid twisting. Often, the most comfortable positions are those that put less stress on your recovering back.  Find a comfortable position to sleep. Use a firm mattress and lie on your side with your knees slightly bent. If you lie on your back, put a pillow under your knees.  Only take over-the-counter or prescription medicines as directed by your caregiver. Over-the-counter medicines to reduce pain and inflammation are often the most helpful.Your caregiver may prescribe muscle relaxant drugs.These medicines help dull your pain so you can more quickly return to your normal activities and healthy exercise.  Put ice on the injured area.  Put ice in a plastic bag.  Place a towel between your skin and the bag.  Leave the ice on for 15-20 minutes, 03-04 times a day for the first 2 to 3 days. After that, ice and heat may be alternated to reduce pain and spasms.  Ask your caregiver about trying back exercises and gentle massage. This may be of some benefit.  Avoid feeling anxious or stressed.Stress increases muscle tension and can worsen back pain.It is important to recognize when you are anxious or stressed and learn ways to manage it.Exercise is a great option. SEEK MEDICAL CARE IF:  You have pain that is not relieved with rest or medicine.  You have pain that does not improve in 1 week.  You have new symptoms.  You are generally not feeling well. SEEK   IMMEDIATE MEDICAL CARE IF:   You have pain that radiates from your back into your legs.  You develop new bowel or bladder control problems.  You have unusual weakness or numbness in your arms or legs.  You develop nausea or vomiting.  You develop abdominal pain.  You feel faint. Document Released: 05/16/2005 Document Revised: 11/15/2011 Document Reviewed: 09/17/2013 Exit[employer] Patient Information 2015 [employer]. This information is not intended to replace advice given to you by your health care provider. Make sure you  discuss any questions you have with your health care provider.  

## 2014-01-10 NOTE — Progress Notes (Signed)
Subjective:    Patient ID: Jonathan [name], male    DOB: 09-Jul-1955, 58 y.o.   MRN: [id number]  Back Pain This is a chronic problem. The current episode started more than 1 year ago. The problem occurs intermittently. The problem is unchanged. The pain is present in the lumbar spine. The quality of the pain is described as aching. The pain does not radiate. The pain is at a severity of 4/10. The pain is moderate. The pain is worse during the day. The symptoms are aggravated by bending. Pertinent negatives include no abdominal pain, bladder incontinence, bowel incontinence, chest pain, dysuria, fever, headaches, leg pain, numbness, paresis, paresthesias, pelvic pain, perianal numbness, tingling, weakness or weight loss. He has tried NSAIDs and analgesics for the symptoms. The treatment provided significant relief.      Review of Systems  Constitutional: Negative.  Negative for fever and weight loss.  HENT: Negative.   Eyes: Negative.   Respiratory: Negative.  Negative for cough, choking, shortness of breath and stridor.   Cardiovascular: Negative.  Negative for chest pain, palpitations and leg swelling.  Gastrointestinal: Negative.  Negative for nausea, vomiting, abdominal pain, diarrhea, constipation, blood in stool and bowel incontinence.  Endocrine: Negative.   Genitourinary: Negative.  Negative for bladder incontinence, dysuria and pelvic pain.  Musculoskeletal: Positive for back pain. Negative for arthralgias, gait problem, joint swelling, myalgias, neck pain and neck stiffness.  Skin: Negative.  Negative for rash.  Allergic/Immunologic: Negative.   Neurological: Negative.  Negative for dizziness, tingling, weakness, numbness, headaches and paresthesias.  Hematological: Negative.  Negative for adenopathy. Does not bruise/bleed easily.  Psychiatric/Behavioral: Negative for suicidal ideas, hallucinations, behavioral problems, confusion, sleep disturbance, self-injury, dysphoric mood,  decreased concentration and agitation. The patient is nervous/anxious. The patient is not hyperactive.        Objective:   Physical Exam  Vitals reviewed. Constitutional: He is oriented to person, place, and time. He appears well-developed and well-nourished. No distress.  HENT:  Head: Normocephalic and atraumatic.  Mouth/Throat: Oropharynx is clear and moist. No oropharyngeal exudate.  Eyes: Conjunctivae are normal. Right eye exhibits no discharge. Left eye exhibits no discharge. No scleral icterus.  Neck: Normal range of motion. Neck supple. No JVD present. No tracheal deviation present. No thyromegaly present.  Cardiovascular: Normal rate, regular rhythm, normal heart sounds and intact distal pulses.  Exam reveals no gallop and no friction rub.   No murmur heard. Pulmonary/Chest: Effort normal and breath sounds normal. No stridor. No respiratory distress. He has no wheezes. He has no rales. He exhibits no tenderness.  Abdominal: Soft. Bowel sounds are normal. He exhibits no distension and no mass. There is no tenderness. There is no rebound and no guarding.  Musculoskeletal: Normal range of motion. He exhibits no edema and no tenderness.  Lymphadenopathy:    He has no cervical adenopathy.  Neurological: He is alert and oriented to person, place, and time. He has normal reflexes. He displays normal reflexes. No cranial nerve deficit. He exhibits normal muscle tone. Coordination normal.  Skin: Skin is warm and dry. No rash noted. He is not diaphoretic. No erythema. No pallor.  Psychiatric: He has a normal mood and affect. His behavior is normal. Judgment and thought content normal.     Lab Results  Component Value Date   WBC 3.6* 09/07/2012   HGB 15.7 09/07/2012   HCT 46.9 09/07/2012   PLT 253.0 09/07/2012   GLUCOSE 87 09/07/2012   CHOL 194 09/07/2012   TRIG 97.0  09/07/2012   HDL 60.00 09/07/2012   LDLDIRECT 131.6 07/14/2011   LDLCALC 115* 09/07/2012   ALT 18 09/07/2012   AST 16  09/07/2012   NA 135 09/07/2012   K 3.7 09/07/2012   CL 101 09/07/2012   CREATININE 0.8 09/07/2012   BUN 7 09/07/2012   CO2 28 09/07/2012   TSH 0.65 07/14/2011   PSA 1.57 09/07/2012       Assessment & Plan:

## 2014-01-10 NOTE — Progress Notes (Signed)
Pre visit review using our clinic review tool, if applicable. No additional management support is needed unless otherwise documented below in the visit note. 

## 2014-01-12 NOTE — Assessment & Plan Note (Signed)
Will cont xanax as needed 

## 2014-01-12 NOTE — Assessment & Plan Note (Signed)
No danger s/s noted He is doing well on the current meds, will cont

## 2014-01-12 NOTE — Assessment & Plan Note (Signed)
His BP is well controlled 

## 2014-03-05 ENCOUNTER — Ambulatory Visit (INDEPENDENT_AMBULATORY_CARE_PROVIDER_SITE_OTHER)
Admission: RE | Admit: 2014-03-05 | Discharge: 2014-03-05 | Disposition: A | Discharge status: Home / Self Care | Payer: PRIVATE HEALTH INSURANCE | Source: Ambulatory Visit | Attending: Family Medicine | Admitting: Family Medicine

## 2014-03-05 ENCOUNTER — Encounter: Payer: Self-pay | Admitting: Family Medicine

## 2014-03-05 ENCOUNTER — Other Ambulatory Visit: Payer: Self-pay | Admitting: Family Medicine

## 2014-03-05 ENCOUNTER — Ambulatory Visit (INDEPENDENT_AMBULATORY_CARE_PROVIDER_SITE_OTHER): Payer: PRIVATE HEALTH INSURANCE | Admitting: Family Medicine

## 2014-03-05 VITALS — BP 112/74 | HR 73 | Ht 73.5 in | Wt 164.0 lb

## 2014-03-05 DIAGNOSIS — G5701 Lesion of sciatic nerve, right lower limb: Secondary | ICD-10-CM | POA: Insufficient documentation

## 2014-03-05 DIAGNOSIS — M25552 Pain in left hip: Secondary | ICD-10-CM

## 2014-03-05 DIAGNOSIS — M87051 Idiopathic aseptic necrosis of right femur: Secondary | ICD-10-CM

## 2014-03-05 DIAGNOSIS — M87052 Idiopathic aseptic necrosis of left femur: Secondary | ICD-10-CM

## 2014-03-05 MED ORDER — PREDNISONE 50 MG PO TABS: 50.0000 mg | ORAL_TABLET | Freq: Every day | ORAL | Status: DC

## 2014-03-05 NOTE — Assessment & Plan Note (Signed)
Will monitor but will need surgical intervention;.

## 2014-03-05 NOTE — Assessment & Plan Note (Signed)
Patient's physical exam today consistent more with piriformis syndrome. Patient though does have a past medical history significant for spinal stenosis and does have severe osteoarthritic changes at L5 on S1 the can also be giving him the radicular symptoms that he is having. Patient does have a positive straight leg test on the right side it does keep lumbar radiculopathy with in the differential. Patient does have signs of avascular necrosis of the femoral heads bilaterally they can also be contributing to once again patient is denying any groin pain and does not have any significant pain with internal rotation today. I do not believe at this time that could be giving him his pain. This will be something that will need to be addressed and we'll discuss further. We discussed different treatment options at this time and presents patient decided to prednisone even though he has had an allergy to cortisone before she thinks would be a good idea. Patient is going to take it daily for the next 5 days. Patient will call if he has any side effects. We discussed an icing regimen as well as manual massage the can be beneficial. Patient is going to come back next week. If patient continues to have difficulty we may need to consider a Marcaine injection into his hip for diagnostic purposes. If this relieves the pain then I do think the patient would need to be sent to surgery for surgical intervention. We'll discuss in followup in one week and hopefully this is only more of a muscle spasm.

## 2014-03-05 NOTE — Progress Notes (Signed)
  [facility] [address] [place], [place] [address] Phone: [phone] Subjective:     CC: Pain after fall  [id number] Jonathan [name] is a 58 y.o. male coming in with complaint of hip pain after fall. Patient states that this is all right-sided. Only occurred after the fell that happened approximately 2 weeks ago. Patient states all the pain is on the lateral aspect and posterior aspect of his leg. States that it seems to be going down his leg symptoms. Patient states that he's been will ambulate with the aid of a cane. Not taking any over-the-counter medications at this time. Patient denies any groin pain that is associated with that. Patient states that he can sleep somewhat comfortably at night. Patient does have a significant past medical history significant for low back pain with multiple epidural steroid injections.    X-rays were ordered reviewed and interpreted by me today. X-rays the patient's hips bilaterally show possible signs of early avascular necrosis of the head to the femoral area. But no acute bony abnormality. Patient also has severe osteophytic changes at L5 at S1.  Past medical history, social, surgical and family history all reviewed in electronic medical record.   Review of Systems: No headache, visual changes, nausea, vomiting, diarrhea, constipation, dizziness, abdominal pain, skin rash, fevers, chills, night sweats, weight loss, swollen lymph nodes, body aches, joint swelling, muscle aches, chest pain, shortness of breath, mood changes.   Objective Blood pressure 112/74, pulse 73, height 6' 1.5" (1.867 m), weight 164 lb (74.39 kg), SpO2 97.00%.  General: No apparent distress alert and oriented x3 mood and affect normal, dressed appropriately.  HEENT: Pupils equal, extraocular movements intact  Respiratory: Patient's speak in full sentences and does not appear short of breath  Cardiovascular: No lower extremity edema, non tender,  no erythema  Skin: Warm dry intact with no signs of infection or rash on extremities or on axial skeleton.  Abdomen: Soft nontender  Neuro: Cranial nerves II through XII are intact, neurovascularly intact in all extremities with 2+ DTRs and 2+ pulses.  Lymph: No lymphadenopathy of posterior or anterior cervical chain or axillae bilaterally.  Gait antalgic gait  MSK:  Non tender with full range of motion and good stability and symmetric strength and tone of shoulders, elbows, wrist, knee and ankles bilaterally.  Hip: Right Patient is tender to palpation over the right piriformis muscle. ROM IR: 15 Deg appears to be symmetric, ER: 35 Deg, Flexion: 100 Deg, Extension: 100 Deg, Abduction: 45 Deg, Adduction: 45 Deg Strength IR: 4/5, ER: 5/5, Flexion: 5/5, Extension: 4/5, Abduction: 4/5, Adduction: 5/5 Pelvic alignment unremarkable to inspection and palpation. Standing hip rotation and gait without trendelenburg sign / unsteadiness. Greater trochanter without tenderness to palpation. No tenderness over piriformis and greater trochanter. Positive Corky Sox been negative FADIR. Mild positive straight leg test right side No SI joint tenderness and normal minimal SI movement. Contralateral hip has about the same range of motion and strength.   Impression and Recommendations:     This case required medical decision making of moderate complexity.

## 2014-03-05 NOTE — Patient Instructions (Signed)
Good to see you Ice 20 minutes 2 times daily. Usually after activity and before bed. Exercises 3 times a week.  Tennis ball back right pocket with sitting.  Prednisone 50mg  daily for 5 days.  Vitamin D 2000 IU daily Turmeric 500mg  twice daily.  Fish oil 2 grams daily.  Come back in 1 week to make sure you are doing well.

## 2014-04-16 ENCOUNTER — Ambulatory Visit (INDEPENDENT_AMBULATORY_CARE_PROVIDER_SITE_OTHER): Payer: PRIVATE HEALTH INSURANCE | Admitting: Family Medicine

## 2014-04-16 VITALS — BP 122/74 | HR 50 | Ht 73.5 in | Wt 166.0 lb

## 2014-04-16 DIAGNOSIS — M87051 Idiopathic aseptic necrosis of right femur: Secondary | ICD-10-CM

## 2014-04-16 DIAGNOSIS — M87052 Idiopathic aseptic necrosis of left femur: Secondary | ICD-10-CM

## 2014-04-16 NOTE — Assessment & Plan Note (Signed)
Discussed with patient again at great length. I do think the patient's avascular necrosis of the heads right greater than left is causing his discomfort. Patient does need to have surgical intervention likely. Patient though was not willing to allow me to put in a referral today. Patient would like to pray on it. Discussed with patient thoughthat this will not improve with conservative therapy and will likely need surgical intervention sooner than later. Patient states he will call when he is ready for surgical intervention.patient will continue with his hydrocodone for pain relief.  Spent greater than 25 minutes with patient face-to-face and had greater than 50% of counseling including as described above in assessment and plan.

## 2014-04-16 NOTE — Patient Instructions (Signed)
Good to see you I would recommend Dr. Mayer Camel or Dr. Rhona Raider at [facility].  I think you need hip replacement and likely a good Canidate for anterior approach Call me when you are ready and I will do my magic.  Ice or heat can help Vitamin D 2000 IU to slow the progression But only way to fix is hip replacement.

## 2014-04-16 NOTE — Progress Notes (Signed)
  [facility] [address] [place], [place] [address] Phone: [phone] Subjective:     CC: continued back and hip pain  [id number] [place] is a 58 y.o. male coming in with complaint of hip pain patient was seen previously and unfortunately patient's x-rays were associated incorrectly. Patient's x-rays do show that he does have avascular necrosis of both hips. Patient also has severe osteoarthritic changes of L5 and S1. Patient was lost to follow-upfor the last 2 months. Patient is coming in with acutely worsening of the pain.patient states that the right hip is giving him significant pain. Patient has tried conservative therapy with only approximately a 1% improvement. Patient states though that if he tries to increase his activity has worsening pain especially in the right groin.   X-rays were ordered reviewed and interpreted by me X-rays the patient's hips bilaterally show possible signs of early avascular necrosis of the head to the femoral area. But no acute bony abnormality. Patient also has severe osteophytic changes at L5 at S1.  Past medical history, social, surgical and family history all reviewed in electronic medical record.   Review of Systems: No headache, visual changes, nausea, vomiting, diarrhea, constipation, dizziness, abdominal pain, skin rash, fevers, chills, night sweats, weight loss, swollen lymph nodes, body aches, joint swelling, muscle aches, chest pain, shortness of breath, mood changes.   Objective Blood pressure 122/74, pulse 50, height 6' 1.5" (1.867 m), weight 166 lb (75.297 kg), SpO2 99 %.  General: No apparent distress alert and oriented x3 mood and affect normal, dressed appropriately.  HEENT: Pupils equal, extraocular movements intact  Respiratory: Patient's speak in full sentences and does not appear short of breath  Cardiovascular: No lower extremity edema, non tender, no erythema  Skin: Warm dry intact with no  signs of infection or rash on extremities or on axial skeleton.  Abdomen: Soft nontender  Neuro: Cranial nerves II through XII are intact, neurovascularly intact in all extremities with 2+ DTRs and 2+ pulses.  Lymph: No lymphadenopathy of posterior or anterior cervical chain or axillae bilaterally.  Gait antalgic gait  MSK:  Non tender with full range of motion and good stability and symmetric strength and tone of shoulders, elbows, wrist, knee and ankles bilaterally.  Hip: Right Patient is tender to palpation over the right piriformis muscle. ROM IR: 15 Deg appears to be symmetric, ER: 35 Deg, Flexion: 100 Deg, Extension: 100 Deg, Abduction: 45 Deg, Adduction: 45 Deg Strength IR: 4/5, ER: 5/5, Flexion: 5/5, Extension: 4/5, Abduction: 4/5, Adduction: 5/5 Pelvic alignment unremarkable to inspection and palpation. Standing hip rotation and gait without trendelenburg sign / unsteadiness. Greater trochanter without tenderness to palpation. No tenderness over piriformis and greater trochanter. Positive FADIR. Significantly positive No SI joint tenderness and normal minimal SI movement. Contralateral hip has about the same range of motion and strength.   Impression and Recommendations:     This case required medical decision making of moderate complexity.

## 2014-05-12 ENCOUNTER — Encounter: Payer: Self-pay | Admitting: Internal Medicine

## 2014-05-12 ENCOUNTER — Ambulatory Visit (INDEPENDENT_AMBULATORY_CARE_PROVIDER_SITE_OTHER): Payer: PRIVATE HEALTH INSURANCE | Admitting: Internal Medicine

## 2014-05-12 VITALS — BP 138/88 | HR 60 | Temp 97.8°F | Resp 16 | Ht 73.5 in | Wt 165.0 lb

## 2014-05-12 DIAGNOSIS — M87052 Idiopathic aseptic necrosis of left femur: Secondary | ICD-10-CM

## 2014-05-12 DIAGNOSIS — N401 Enlarged prostate with lower urinary tract symptoms: Secondary | ICD-10-CM

## 2014-05-12 DIAGNOSIS — M4806 Spinal stenosis, lumbar region: Secondary | ICD-10-CM

## 2014-05-12 DIAGNOSIS — M48061 Spinal stenosis, lumbar region without neurogenic claudication: Secondary | ICD-10-CM

## 2014-05-12 DIAGNOSIS — F411 Generalized anxiety disorder: Secondary | ICD-10-CM

## 2014-05-12 DIAGNOSIS — M87051 Idiopathic aseptic necrosis of right femur: Secondary | ICD-10-CM

## 2014-05-12 DIAGNOSIS — N138 Other obstructive and reflux uropathy: Secondary | ICD-10-CM

## 2014-05-12 MED ORDER — ALPRAZOLAM 1 MG PO TABS: 1.0000 mg | ORAL_TABLET | Freq: Three times a day (TID) | ORAL | Status: DC | PRN

## 2014-05-12 MED ORDER — HYDROCODONE-ACETAMINOPHEN 10-325 MG PO TABS: 1.0000 | ORAL_TABLET | Freq: Four times a day (QID) | ORAL | Status: DC | PRN

## 2014-05-12 MED ORDER — TAMSULOSIN HCL 0.4 MG PO CAPS: 0.4000 mg | ORAL_CAPSULE | Freq: Every day | ORAL | Status: DC

## 2014-05-12 NOTE — Assessment & Plan Note (Signed)
Will refer to ortho to consider surgical options Will control the pain with motrin and norco for now

## 2014-05-12 NOTE — Assessment & Plan Note (Signed)
Will cont xanax as needed 

## 2014-05-12 NOTE — Progress Notes (Signed)
Pre visit review using our clinic review tool, if applicable. No additional management support is needed unless otherwise documented below in the visit note. 

## 2014-05-12 NOTE — Assessment & Plan Note (Signed)
He does not want to consider surgery for this Will cont norco as needed for pain

## 2014-05-12 NOTE — Patient Instructions (Signed)
Back Pain, Adult Low back pain is very common. About 1 in 5 people have back pain.The cause of low back pain is rarely dangerous. The pain often gets better over time.About half of people with a sudden onset of back pain feel better in just 2 weeks. About 8 in 10 people feel better by 6 weeks.  CAUSES Some common causes of back pain include:  Strain of the muscles or ligaments supporting the spine.  Wear and tear (degeneration) of the spinal discs.  Arthritis.  Direct injury to the back. DIAGNOSIS Most of the time, the direct cause of low back pain is not known.However, back pain can be treated effectively even when the exact cause of the pain is unknown.Answering your caregiver's questions about your overall health and symptoms is one of the most accurate ways to make sure the cause of your pain is not dangerous. If your caregiver needs more information, he or she may order lab work or imaging tests (X-rays or MRIs).However, even if imaging tests show changes in your back, this usually does not require surgery. HOME CARE INSTRUCTIONS For many people, back pain returns.Since low back pain is rarely dangerous, it is often a condition that people can learn to manageon their own.   Remain active. It is stressful on the back to sit or stand in one place. Do not sit, drive, or stand in one place for more than 30 minutes at a time. Take short walks on level surfaces as soon as pain allows.Try to increase the length of time you walk each day.  Do not stay in bed.Resting more than 1 or 2 days can delay your recovery.  Do not avoid exercise or work.Your body is made to move.It is not dangerous to be active, even though your back may hurt.Your back will likely heal faster if you return to being active before your pain is gone.  Pay attention to your body when you bend and lift. Many people have less discomfortwhen lifting if they bend their knees, keep the load close to their bodies,and  avoid twisting. Often, the most comfortable positions are those that put less stress on your recovering back.  Find a comfortable position to sleep. Use a firm mattress and lie on your side with your knees slightly bent. If you lie on your back, put a pillow under your knees.  Only take over-the-counter or prescription medicines as directed by your caregiver. Over-the-counter medicines to reduce pain and inflammation are often the most helpful.Your caregiver may prescribe muscle relaxant drugs.These medicines help dull your pain so you can more quickly return to your normal activities and healthy exercise.  Put ice on the injured area.  Put ice in a plastic bag.  Place a towel between your skin and the bag.  Leave the ice on for 15-20 minutes, 03-04 times a day for the first 2 to 3 days. After that, ice and heat may be alternated to reduce pain and spasms.  Ask your caregiver about trying back exercises and gentle massage. This may be of some benefit.  Avoid feeling anxious or stressed.Stress increases muscle tension and can worsen back pain.It is important to recognize when you are anxious or stressed and learn ways to manage it.Exercise is a great option. SEEK MEDICAL CARE IF:  You have pain that is not relieved with rest or medicine.  You have pain that does not improve in 1 week.  You have new symptoms.  You are generally not feeling well. SEEK   IMMEDIATE MEDICAL CARE IF:   You have pain that radiates from your back into your legs.  You develop new bowel or bladder control problems.  You have unusual weakness or numbness in your arms or legs.  You develop nausea or vomiting.  You develop abdominal pain.  You feel faint. Document Released: 05/16/2005 Document Revised: 11/15/2011 Document Reviewed: 09/17/2013 Exit[employer] Patient Information 2015 [employer]. This information is not intended to replace advice given to you by your health care provider. Make sure you  discuss any questions you have with your health care provider.  

## 2014-05-12 NOTE — Progress Notes (Signed)
Subjective:    Patient ID: Jonathan [name], male    DOB: November 03, 1955, 58 y.o.   MRN: [id number]  Back Pain This is a chronic problem. The current episode started more than 1 year ago. The problem occurs constantly. The problem is unchanged. The pain is present in the lumbar spine. The pain does not radiate. The pain is at a severity of 6/10. The pain is moderate. The pain is worse during the day. The symptoms are aggravated by bending, position and standing. Stiffness is present in the morning. Pertinent negatives include no abdominal pain, bladder incontinence, bowel incontinence, chest pain, dysuria, fever, headaches, leg pain, numbness, paresis, paresthesias, pelvic pain, perianal numbness, tingling, weakness or weight loss. He has tried NSAIDs and analgesics for the symptoms. The treatment provided moderate relief.      Review of Systems  Constitutional: Negative.  Negative for fever, chills, weight loss, diaphoresis, appetite change and fatigue.  HENT: Negative.   Eyes: Negative.   Respiratory: Negative.  Negative for cough, choking, chest tightness, shortness of breath and stridor.   Cardiovascular: Negative.  Negative for chest pain, palpitations and leg swelling.  Gastrointestinal: Negative.  Negative for nausea, vomiting, abdominal pain, diarrhea, constipation and bowel incontinence.  Endocrine: Negative.   Genitourinary: Negative.  Negative for bladder incontinence, dysuria and pelvic pain.  Musculoskeletal: Positive for back pain and arthralgias.  Skin: Negative.   Allergic/Immunologic: Negative.   Neurological: Negative.  Negative for tingling, weakness, numbness, headaches and paresthesias.  Hematological: Negative.  Negative for adenopathy. Does not bruise/bleed easily.  Psychiatric/Behavioral: Positive for sleep disturbance. Negative for suicidal ideas, hallucinations, behavioral problems, confusion, self-injury, dysphoric mood, decreased concentration and agitation. The  patient is nervous/anxious. The patient is not hyperactive.        Objective:   Physical Exam  Constitutional: He is oriented to person, place, and time. He appears well-developed and well-nourished. No distress.  HENT:  Head: Normocephalic and atraumatic.  Mouth/Throat: Oropharynx is clear and moist. No oropharyngeal exudate.  Eyes: Conjunctivae are normal. Right eye exhibits no discharge. Left eye exhibits no discharge. No scleral icterus.  Neck: Normal range of motion. Neck supple. No JVD present. No tracheal deviation present. No thyromegaly present.  Cardiovascular: Normal rate, regular rhythm, normal heart sounds and intact distal pulses.  Exam reveals no gallop and no friction rub.   No murmur heard. Pulmonary/Chest: Effort normal and breath sounds normal. No stridor. No respiratory distress. He has no wheezes. He has no rales. He exhibits no tenderness.  Abdominal: Soft. Bowel sounds are normal. He exhibits no distension and no mass. There is no tenderness. There is no rebound and no guarding.  Musculoskeletal: Normal range of motion. He exhibits no edema or tenderness.       Lumbar back: Normal. He exhibits normal range of motion, no tenderness, no bony tenderness, no swelling, no edema, no deformity, no laceration, no pain, no spasm and normal pulse.  Lymphadenopathy:    He has no cervical adenopathy.  Neurological: He is oriented to person, place, and time. He has normal strength. He displays no atrophy and no tremor. He exhibits normal muscle tone. He displays a negative Romberg sign. He displays no seizure activity. Coordination and gait normal.  Skin: Skin is warm and dry. No rash noted. He is not diaphoretic. No erythema. No pallor.  Psychiatric: He has a normal mood and affect. His speech is normal and behavior is normal. Judgment and thought content normal. His mood appears not anxious. Cognition and  memory are normal. He does not exhibit a depressed mood.  Vitals  reviewed.    Lab Results  Component Value Date   WBC 3.6* 09/07/2012   HGB 15.7 09/07/2012   HCT 46.9 09/07/2012   PLT 253.0 09/07/2012   GLUCOSE 87 09/07/2012   CHOL 194 09/07/2012   TRIG 97.0 09/07/2012   HDL 60.00 09/07/2012   LDLDIRECT 131.6 07/14/2011   LDLCALC 115* 09/07/2012   ALT 18 09/07/2012   AST 16 09/07/2012   NA 135 09/07/2012   K 3.7 09/07/2012   CL 101 09/07/2012   CREATININE 0.8 09/07/2012   BUN 7 09/07/2012   CO2 28 09/07/2012   TSH 0.65 07/14/2011   PSA 1.57 09/07/2012       Assessment & Plan:

## 2014-05-13 ENCOUNTER — Telehealth: Payer: Self-pay | Admitting: Internal Medicine

## 2014-05-13 NOTE — Telephone Encounter (Signed)
emmi mailed  °

## 2014-07-14 ENCOUNTER — Telehealth: Payer: Self-pay | Admitting: Internal Medicine

## 2014-07-14 NOTE — Telephone Encounter (Signed)
States patient has been waiting for months for a referral to [facility].  States [name] and Tamala Julian told him he needed to go back there.  Please give a call back in regards.

## 2014-07-15 NOTE — Telephone Encounter (Signed)
Faxed to [facility] at [place] [phone]. Records has to be reviewed by doctor and doctor has to accept patient.

## 2014-07-29 ENCOUNTER — Other Ambulatory Visit: Payer: Self-pay | Admitting: Internal Medicine

## 2014-08-04 ENCOUNTER — Telehealth: Payer: Self-pay | Admitting: Internal Medicine

## 2014-08-04 DIAGNOSIS — M48061 Spinal stenosis, lumbar region without neurogenic claudication: Secondary | ICD-10-CM

## 2014-08-04 DIAGNOSIS — M87051 Idiopathic aseptic necrosis of right femur: Secondary | ICD-10-CM

## 2014-08-04 DIAGNOSIS — M87052 Idiopathic aseptic necrosis of left femur: Principal | ICD-10-CM

## 2014-08-04 MED ORDER — HYDROCODONE-ACETAMINOPHEN 10-325 MG PO TABS: 1.0000 | ORAL_TABLET | Freq: Four times a day (QID) | ORAL | Status: DC | PRN

## 2014-08-04 NOTE — Telephone Encounter (Signed)
Called pt spoke with wife inform rx ready fro pick-up...[name]

## 2014-08-04 NOTE — Telephone Encounter (Signed)
done

## 2014-08-04 NOTE — Telephone Encounter (Signed)
Pt called in requesting refill on his hydrocodone

## 2014-08-08 ENCOUNTER — Ambulatory Visit: Payer: Self-pay | Admitting: Orthopedic Surgery

## 2014-08-08 MED ORDER — ONDANSETRON HCL 40 MG/20ML IJ SOLN: 4.0000 mg | Freq: Once | INTRAMUSCULAR | Status: DC

## 2014-08-08 MED ORDER — ACETAMINOPHEN 10 MG/ML IV SOLN: 1000.0000 mg | Freq: Once | INTRAVENOUS | Status: AC

## 2014-08-08 MED ORDER — DEXAMETHASONE SODIUM PHOSPHATE 4 MG/ML IJ SOLN: 4.0000 mg | Freq: Once | INTRAMUSCULAR | Status: DC

## 2014-08-13 NOTE — Patient Instructions (Addendum)
Jonathan [name]  08/13/2014   Your procedure is scheduled on: 08/21/2014    Report to [facility] Main  Entrance and follow signs to               [place] at      145pm  Call this number if you have problems the morning of surgery [phone]   Remember:  Do not eat food after midnite.  May have clear liquids until 0900am then nothing by mouth.       Take these medicines the morning of surgery with A SIP OF WATER: Xanax, Zyrtec, Flomax                               You may not have any metal on your body including hair pins and              piercings  Do not wear jewelry, , lotions, powders or perfumes., deodorant.                          Men may shave face and neck.   Do not bring valuables to the hospital. [place].  Contacts, dentures or bridgework may not be worn into surgery.  Leave suitcase in the car. After surgery it may be brought to your room.         Special Instructions: coughing and deep breathing exercises, leg exercises    CLEAR LIQUID DIET   Foods Allowed                                                                     Foods Excluded  Coffee and tea, regular and decaf                             liquids that you cannot  Plain Jell-O in any flavor                                             see through such as: Fruit ices (not with fruit pulp)                                     milk, soups, orange juice  Iced Popsicles                                    All solid food Carbonated beverages, regular and diet                                    Cranberry, grape and apple juices Sports drinks like Gatorade  Lightly seasoned clear broth or consume(fat free) Sugar, honey syrup  Sample Menu Breakfast                                Lunch                                     Supper Cranberry juice                    Beef broth                            Chicken broth Jell-O                                      Grape juice                           Apple juice Coffee or tea                        Jell-O                                      Popsicle                                                Coffee or tea                        Coffee or tea  _____________________________________________________________________                Please read over the following fact sheets you were given: _____________________________________________________________________             [facility] - Preparing for Surgery Before surgery, you can play an important role.  Because skin is not sterile, your skin needs to be as free of germs as possible.  You can reduce the number of germs on your skin by washing with CHG (chlorahexidine gluconate) soap before surgery.  CHG is an antiseptic cleaner which kills germs and bonds with the skin to continue killing germs even after washing. Please DO NOT use if you have an allergy to CHG or antibacterial soaps.  If your skin becomes reddened/irritated stop using the CHG and inform your nurse when you arrive at Short Stay. Do not shave (including legs and underarms) for at least 48 hours prior to the first CHG shower.  You may shave your face/neck. Please follow these instructions carefully:  1.  Shower with CHG Soap the night before surgery and the  morning of Surgery.  2.  If you choose to wash your hair, wash your hair first as usual with your  normal  shampoo.  3.  After you shampoo, rinse your hair and body thoroughly to remove the  shampoo.  4.  Use CHG as you would any other liquid soap.  You can apply chg directly  to the skin and wash                       Gently with a scrungie or clean washcloth.  5.  Apply the CHG Soap to your body ONLY FROM THE NECK DOWN.   Do not use on face/ open                           Wound or open sores. Avoid contact with eyes, ears mouth and genitals (private parts).                        Wash face,  Genitals (private parts) with your normal soap.             6.  Wash thoroughly, paying special attention to the area where your surgery  will be performed.  7.  Thoroughly rinse your body with warm water from the neck down.  8.  DO NOT shower/wash with your normal soap after using and rinsing off  the CHG Soap.                9.  Pat yourself dry with a clean towel.            10.  Wear clean pajamas.            11.  Place clean sheets on your bed the night of your first shower and do not  sleep with pets. Day of Surgery : Do not apply any lotions/deodorants the morning of surgery.  Please wear clean clothes to the hospital/surgery center.  FAILURE TO FOLLOW THESE INSTRUCTIONS MAY RESULT IN THE CANCELLATION OF YOUR SURGERY PATIENT SIGNATURE_________________________________  NURSE SIGNATURE__________________________________  ________________________________________________________________________  WHAT IS A BLOOD TRANSFUSION? Blood Transfusion Information  A transfusion is the replacement of blood or some of its parts. Blood is made up of multiple cells which provide different functions.  Red blood cells carry oxygen and are used for blood loss replacement.  White blood cells fight against infection.  Platelets control bleeding.  Plasma helps clot blood.  Other blood products are available for specialized needs, such as hemophilia or other clotting disorders. BEFORE THE TRANSFUSION  Who gives blood for transfusions?   Healthy volunteers who are fully evaluated to make sure their blood is safe. This is blood bank blood. Transfusion therapy is the safest it has ever been in the practice of medicine. Before blood is taken from a donor, a complete history is taken to make sure that person has no history of diseases nor engages in risky social behavior (examples are intravenous drug use or sexual activity with multiple partners). The donor's travel history is screened to  minimize risk of transmitting infections, such as malaria. The donated blood is tested for signs of infectious diseases, such as HIV and hepatitis. The blood is then tested to be sure it is compatible with you in order to minimize the chance of a transfusion reaction. If you or a relative donates blood, this is often done in anticipation of surgery and is not appropriate for emergency situations. It takes many days to process the donated blood. RISKS AND COMPLICATIONS Although transfusion therapy is very safe and saves many lives, the main dangers of transfusion include:  1. Getting an infectious disease. 2. Developing a transfusion reaction.  This is an allergic reaction to something in the blood you were given. Every precaution is taken to prevent this. The decision to have a blood transfusion has been considered carefully by your caregiver before blood is given. Blood is not given unless the benefits outweigh the risks. AFTER THE TRANSFUSION  Right after receiving a blood transfusion, you will usually feel much better and more energetic. This is especially true if your red blood cells have gotten low (anemic). The transfusion raises the level of the red blood cells which carry oxygen, and this usually causes an energy increase.  The nurse administering the transfusion will monitor you carefully for complications. HOME CARE INSTRUCTIONS  No special instructions are needed after a transfusion. You may find your energy is better. Speak with your caregiver about any limitations on activity for underlying diseases you may have. SEEK MEDICAL CARE IF:   Your condition is not improving after your transfusion.  You develop redness or irritation at the intravenous (IV) site. SEEK IMMEDIATE MEDICAL CARE IF:  Any of the following symptoms occur over the next 12 hours:  Shaking chills.  You have a temperature by mouth above 102 F (38.9 C), not controlled by medicine.  Chest, back, or muscle  pain.  People around you feel you are not acting correctly or are confused.  Shortness of breath or difficulty breathing.  Dizziness and fainting.  You get a rash or develop hives.  You have a decrease in urine output.  Your urine turns a dark color or changes to pink, red, or brown. Any of the following symptoms occur over the next 10 days:  You have a temperature by mouth above 102 F (38.9 C), not controlled by medicine.  Shortness of breath.  Weakness after normal activity.  The white part of the eye turns yellow (jaundice).  You have a decrease in the amount of urine or are urinating less often.  Your urine turns a dark color or changes to pink, red, or brown. Document Released: 05/13/2000 Document Revised: 08/08/2011 Document Reviewed: 12/31/2007 [employer] Patient Information 2014 [place].  _______________________________________________________________________  Incentive Spirometer  An incentive spirometer is a tool that can help keep your lungs clear and active. This tool measures how well you are filling your lungs with each breath. Taking long deep breaths may help reverse or decrease the chance of developing breathing (pulmonary) problems (especially infection) following:  A long period of time when you are unable to move or be active. BEFORE THE PROCEDURE   If the spirometer includes an indicator to show your best effort, your nurse or respiratory therapist will set it to a desired goal.  If possible, sit up straight or lean slightly forward. Try not to slouch.  Hold the incentive spirometer in an upright position. INSTRUCTIONS FOR USE  3. Sit on the edge of your bed if possible, or sit up as far as you can in bed or on a chair. 4. Hold the incentive spirometer in an upright position. 5. Breathe out normally. 6. Place the mouthpiece in your mouth and seal your lips tightly around it. 7. Breathe in slowly and as deeply as possible, raising the piston  or the ball toward the top of the column. 8. Hold your breath for 3-5 seconds or for as long as possible. Allow the piston or ball to fall to the bottom of the column. 9. Remove the mouthpiece from your mouth and breathe out normally. 10. Rest for a few seconds and repeat Steps  1 through 7 at least 10 times every 1-2 hours when you are awake. Take your time and take a few normal breaths between deep breaths. 11. The spirometer may include an indicator to show your best effort. Use the indicator as a goal to work toward during each repetition. 12. After each set of 10 deep breaths, practice coughing to be sure your lungs are clear. If you have an incision (the cut made at the time of surgery), support your incision when coughing by placing a pillow or rolled up towels firmly against it. Once you are able to get out of bed, walk around indoors and cough well. You may stop using the incentive spirometer when instructed by your caregiver.  RISKS AND COMPLICATIONS  Take your time so you do not get dizzy or light-headed.  If you are in pain, you may need to take or ask for pain medication before doing incentive spirometry. It is harder to take a deep breath if you are having pain. AFTER USE  Rest and breathe slowly and easily.  It can be helpful to keep track of a log of your progress. Your caregiver can provide you with a simple table to help with this. If you are using the spirometer at home, follow these instructions: [place] IF:   You are having difficultly using the spirometer.  You have trouble using the spirometer as often as instructed.  Your pain medication is not giving enough relief while using the spirometer.  You develop fever of 100.5 F (38.1 C) or higher. SEEK IMMEDIATE MEDICAL CARE IF:   You cough up bloody sputum that had not been present before.  You develop fever of 102 F (38.9 C) or greater.  You develop worsening pain at or near the incision site. MAKE  SURE YOU:   Understand these instructions.  Will watch your condition.  Will get help right away if you are not doing well or get worse. Document Released: 09/26/2006 Document Revised: 08/08/2011 Document Reviewed: 11/27/2006 [facility] Patient Information 2014 [place], [place].   ________________________________________________________________________

## 2014-08-14 ENCOUNTER — Encounter (HOSPITAL_COMMUNITY): Payer: Self-pay

## 2014-08-14 ENCOUNTER — Encounter (HOSPITAL_COMMUNITY)
Admission: RE | Admit: 2014-08-14 | Discharge: 2014-08-14 | Disposition: A | Discharge status: Home / Self Care | Payer: Medicare Other | Source: Ambulatory Visit | Attending: Orthopedic Surgery | Admitting: Orthopedic Surgery

## 2014-08-14 DIAGNOSIS — Z01812 Encounter for preprocedural laboratory examination: Secondary | ICD-10-CM | POA: Diagnosis not present

## 2014-08-14 LAB — SURGICAL PCR SCREEN
MRSA, PCR: NEGATIVE
Staphylococcus aureus: NEGATIVE

## 2014-08-14 LAB — URINALYSIS, ROUTINE W REFLEX MICROSCOPIC
Bilirubin Urine: NEGATIVE
Glucose, UA: NEGATIVE mg/dL
Hgb urine dipstick: NEGATIVE
Ketones, ur: NEGATIVE mg/dL
Leukocytes, UA: NEGATIVE
Nitrite: NEGATIVE
Protein, ur: NEGATIVE mg/dL
Specific Gravity, Urine: 1.023 (ref 1.005–1.030)
Urobilinogen, UA: 1 mg/dL (ref 0.0–1.0)
pH: 6.5 (ref 5.0–8.0)

## 2014-08-14 LAB — CBC
HCT: 47.4 % (ref 39.0–52.0)
Hemoglobin: 16.2 g/dL (ref 13.0–17.0)
MCH: 28.9 pg (ref 26.0–34.0)
MCHC: 34.2 g/dL (ref 30.0–36.0)
MCV: 84.5 fL (ref 78.0–100.0)
Platelets: 266 10*3/uL (ref 150–400)
RBC: 5.61 MIL/uL (ref 4.22–5.81)
RDW: 14.1 % (ref 11.5–15.5)
WBC: 4.2 10*3/uL (ref 4.0–10.5)

## 2014-08-14 LAB — COMPREHENSIVE METABOLIC PANEL
ALT: 16 U/L (ref 0–53)
AST: 18 U/L (ref 0–37)
Albumin: 4.6 g/dL (ref 3.5–5.2)
Alkaline Phosphatase: 67 U/L (ref 39–117)
Anion gap: 10 (ref 5–15)
BUN: 11 mg/dL (ref 6–23)
CO2: 27 mmol/L (ref 19–32)
Calcium: 9.5 mg/dL (ref 8.4–10.5)
Chloride: 102 mmol/L (ref 96–112)
Creatinine, Ser: 0.87 mg/dL (ref 0.50–1.35)
GFR calc Af Amer: >90 mL/min (ref >90)
GFR calc non Af Amer: >90 mL/min (ref >90)
Glucose, Bld: 89 mg/dL (ref 70–99)
Potassium: 4.5 mmol/L (ref 3.5–5.1)
Sodium: 139 mmol/L (ref 135–145)
Total Bilirubin: 0.9 mg/dL (ref 0.3–1.2)
Total Protein: 7.7 g/dL (ref 6.0–8.3)

## 2014-08-14 LAB — PROTIME-INR
INR: 1.03 (ref 0.00–1.49)
Prothrombin Time: 13.6 seconds (ref 11.6–15.2)

## 2014-08-14 LAB — APTT: aPTT: 41 seconds — ABNORMAL HIGH (ref 24–37)

## 2014-08-14 NOTE — Progress Notes (Signed)
Faxed PTT results done 08/14/14 to Dr Lyla Glassing via [facility].

## 2014-08-21 ENCOUNTER — Inpatient Hospital Stay (HOSPITAL_COMMUNITY): Admission: RE | Admit: 2014-08-21 | Payer: Medicare Other | Source: Ambulatory Visit | Admitting: Orthopedic Surgery

## 2014-08-21 ENCOUNTER — Encounter (HOSPITAL_COMMUNITY): Admission: RE | Payer: Self-pay | Source: Ambulatory Visit

## 2014-08-21 SURGERY — ARTHROPLASTY, HIP, TOTAL, ANTERIOR APPROACH
Anesthesia: Choice | Site: Hip | Laterality: Right

## 2014-08-25 ENCOUNTER — Encounter: Payer: Self-pay | Admitting: Internal Medicine

## 2014-08-25 ENCOUNTER — Ambulatory Visit (INDEPENDENT_AMBULATORY_CARE_PROVIDER_SITE_OTHER): Payer: Medicare Other | Admitting: Internal Medicine

## 2014-08-25 VITALS — BP 124/84 | HR 58 | Temp 97.6°F | Resp 16 | Wt 171.0 lb

## 2014-08-25 DIAGNOSIS — I10 Essential (primary) hypertension: Secondary | ICD-10-CM

## 2014-08-25 DIAGNOSIS — M4806 Spinal stenosis, lumbar region: Secondary | ICD-10-CM | POA: Diagnosis not present

## 2014-08-25 DIAGNOSIS — M87051 Idiopathic aseptic necrosis of right femur: Secondary | ICD-10-CM

## 2014-08-25 DIAGNOSIS — M48061 Spinal stenosis, lumbar region without neurogenic claudication: Secondary | ICD-10-CM

## 2014-08-25 DIAGNOSIS — M87052 Idiopathic aseptic necrosis of left femur: Secondary | ICD-10-CM

## 2014-08-25 DIAGNOSIS — R9431 Abnormal electrocardiogram [ECG] [EKG]: Secondary | ICD-10-CM | POA: Diagnosis not present

## 2014-08-25 DIAGNOSIS — F411 Generalized anxiety disorder: Secondary | ICD-10-CM | POA: Diagnosis not present

## 2014-08-25 DIAGNOSIS — L259 Unspecified contact dermatitis, unspecified cause: Secondary | ICD-10-CM

## 2014-08-25 MED ORDER — BETAMETHASONE VALERATE 0.12 % EX FOAM: 1.0000 | Freq: Two times a day (BID) | CUTANEOUS | Status: DC

## 2014-08-25 MED ORDER — HYDROCODONE-ACETAMINOPHEN 10-325 MG PO TABS: 1.0000 | ORAL_TABLET | Freq: Four times a day (QID) | ORAL | Status: DC | PRN

## 2014-08-25 MED ORDER — ALPRAZOLAM 1 MG PO TABS: 1.0000 mg | ORAL_TABLET | Freq: Three times a day (TID) | ORAL | Status: DC | PRN

## 2014-08-25 NOTE — Progress Notes (Signed)
Pre visit review using our clinic review tool, if applicable. No additional management support is needed unless otherwise documented below in the visit note. 

## 2014-08-25 NOTE — Patient Instructions (Signed)

## 2014-08-26 NOTE — Assessment & Plan Note (Addendum)
His BP is well controlled No meds are being taken Will cont with lifestyle modifications

## 2014-08-26 NOTE — Progress Notes (Signed)
   Subjective:    Patient ID: Jonathan [name], male    DOB: 1956-01-16, 59 y.o.   MRN: [id number]  HPI Comments: He requests a pre-op clearance for right THR surgery, he has no CP or SOB. He has a history of an abnormal EKG but saw cardiology and was cleared with an ETT.     Review of Systems  Constitutional: Negative.  Negative for fever, chills, diaphoresis, appetite change and fatigue.  HENT: Negative.   Eyes: Negative.   Respiratory: Negative.  Negative for cough, choking, chest tightness, shortness of breath and stridor.   Cardiovascular: Negative.  Negative for chest pain, palpitations and leg swelling.  Gastrointestinal: Negative.  Negative for nausea, vomiting, abdominal pain, diarrhea, constipation and blood in stool.  Endocrine: Negative.   Genitourinary: Negative.   Musculoskeletal: Positive for back pain and arthralgias. Negative for myalgias, joint swelling, gait problem, neck pain and neck stiffness.  Skin: Negative.  Negative for rash.  Allergic/Immunologic: Negative.   Neurological: Negative.  Negative for dizziness, tremors, seizures, speech difficulty, weakness, light-headedness and headaches.  Hematological: Negative.  Negative for adenopathy. Does not bruise/bleed easily.  Psychiatric/Behavioral: Positive for sleep disturbance. Negative for suicidal ideas, hallucinations, behavioral problems, confusion, self-injury, dysphoric mood, decreased concentration and agitation. The patient is nervous/anxious. The patient is not hyperactive.        Objective:   Physical Exam  Constitutional: He is oriented to person, place, and time. He appears well-developed and well-nourished. No distress.  HENT:  Head: Normocephalic and atraumatic.  Mouth/Throat: Oropharynx is clear and moist. No oropharyngeal exudate.  Eyes: Conjunctivae are normal. Right eye exhibits no discharge. Left eye exhibits no discharge. No scleral icterus.  Neck: Normal range of motion. Neck supple. No JVD  present. No tracheal deviation present. No thyromegaly present.  Cardiovascular: Normal rate, regular rhythm, normal heart sounds and intact distal pulses.  Exam reveals no gallop and no friction rub.   No murmur heard. Pulmonary/Chest: Effort normal and breath sounds normal. No stridor. No respiratory distress. He has no wheezes. He has no rales. He exhibits no tenderness.  Abdominal: Soft. Bowel sounds are normal. He exhibits no distension and no mass. There is no tenderness. There is no rebound and no guarding.  Musculoskeletal: Normal range of motion. He exhibits no edema or tenderness.  Lymphadenopathy:    He has no cervical adenopathy.  Neurological: He is oriented to person, place, and time.  Skin: Skin is warm and dry. No rash noted. He is not diaphoretic. No erythema. No pallor.  Psychiatric: He has a normal mood and affect. His behavior is normal. Judgment and thought content normal.  Vitals reviewed.   Lab Results  Component Value Date   WBC 4.2 08/14/2014   HGB 16.2 08/14/2014   HCT 47.4 08/14/2014   PLT 266 08/14/2014   GLUCOSE 89 08/14/2014   CHOL 194 09/07/2012   TRIG 97.0 09/07/2012   HDL 60.00 09/07/2012   LDLDIRECT 131.6 07/14/2011   LDLCALC 115* 09/07/2012   ALT 16 08/14/2014   AST 18 08/14/2014   NA 139 08/14/2014   K 4.5 08/14/2014   CL 102 08/14/2014   CREATININE 0.87 08/14/2014   BUN 11 08/14/2014   CO2 27 08/14/2014   TSH 0.65 07/14/2011   PSA 1.57 09/07/2012   INR 1.03 08/14/2014        Assessment & Plan:

## 2014-08-26 NOTE — Assessment & Plan Note (Signed)
Will cont xanax as needed

## 2014-08-26 NOTE — Assessment & Plan Note (Signed)
Will cont norco for pain THR will be completed soon

## 2014-08-26 NOTE — Assessment & Plan Note (Signed)
Surgical clearance given He has no concerning s/s

## 2014-09-04 NOTE — Progress Notes (Signed)
Please put orders in Epic surgery 09-18-14 pre op 09-08-14 Thanks

## 2014-09-05 NOTE — Patient Instructions (Signed)
Jonathan [name]  09/05/2014   Your procedure is scheduled on:    09/18/14    Report to [facility] Main  Entrance and follow signs to               [place] at     1045  AM.  Call this number if you have problems the morning of surgery [phone]   Remember:  Do not eat food or drink liquids :After Midnight.     Take these medicines the morning of surgery with A SIP OF WATER:  Xanax if needed, Zyrtec, Flomax, Hydrocodone if needed                                You may not have any metal on your body including hair pins and              piercings  Do not wear jewelry, lotions, powders or perfumes., deodorant.                        Men may shave face and neck.   Do not bring valuables to the hospital. [place].  Contacts, dentures or bridgework may not be worn into surgery.  Leave suitcase in the car. After surgery it may be brought to your room.         Special Instructions: coughing and deep breathing exercises, leg exercises               Please read over the following fact sheets you were given: _____________________________________________________________________             [facility] - Preparing for Surgery Before surgery, you can play an important role.  Because skin is not sterile, your skin needs to be as free of germs as possible.  You can reduce the number of germs on your skin by washing with CHG (chlorahexidine gluconate) soap before surgery.  CHG is an antiseptic cleaner which kills germs and bonds with the skin to continue killing germs even after washing. Please DO NOT use if you have an allergy to CHG or antibacterial soaps.  If your skin becomes reddened/irritated stop using the CHG and inform your nurse when you arrive at Short Stay. Do not shave (including legs and underarms) for at least 48 hours prior to the first CHG shower.  You may shave your face/neck. Please follow  these instructions carefully:  1.  Shower with CHG Soap the night before surgery and the  morning of Surgery.  2.  If you choose to wash your hair, wash your hair first as usual with your  normal  shampoo.  3.  After you shampoo, rinse your hair and body thoroughly to remove the  shampoo.                           4.  Use CHG as you would any other liquid soap.  You can apply chg directly  to the skin and wash                       Gently with a scrungie or clean washcloth.  5.  Apply  the CHG Soap to your body ONLY FROM THE NECK DOWN.   Do not use on face/ open                           Wound or open sores. Avoid contact with eyes, ears mouth and genitals (private parts).                       Wash face,  Genitals (private parts) with your normal soap.             6.  Wash thoroughly, paying special attention to the area where your surgery  will be performed.  7.  Thoroughly rinse your body with warm water from the neck down.  8.  DO NOT shower/wash with your normal soap after using and rinsing off  the CHG Soap.                9.  Pat yourself dry with a clean towel.            10.  Wear clean pajamas.            11.  Place clean sheets on your bed the night of your first shower and do not  sleep with pets. Day of Surgery : Do not apply any lotions/deodorants the morning of surgery.  Please wear clean clothes to the hospital/surgery center.  FAILURE TO FOLLOW THESE INSTRUCTIONS MAY RESULT IN THE CANCELLATION OF YOUR SURGERY PATIENT SIGNATURE_________________________________  NURSE SIGNATURE__________________________________  ________________________________________________________________________  WHAT IS A BLOOD TRANSFUSION? Blood Transfusion Information  A transfusion is the replacement of blood or some of its parts. Blood is made up of multiple cells which provide different functions.  Red blood cells carry oxygen and are used for blood loss replacement.  White blood cells fight  against infection.  Platelets control bleeding.  Plasma helps clot blood.  Other blood products are available for specialized needs, such as hemophilia or other clotting disorders. BEFORE THE TRANSFUSION  Who gives blood for transfusions?   Healthy volunteers who are fully evaluated to make sure their blood is safe. This is blood bank blood. Transfusion therapy is the safest it has ever been in the practice of medicine. Before blood is taken from a donor, a complete history is taken to make sure that person has no history of diseases nor engages in risky social behavior (examples are intravenous drug use or sexual activity with multiple partners). The donor's travel history is screened to minimize risk of transmitting infections, such as malaria. The donated blood is tested for signs of infectious diseases, such as HIV and hepatitis. The blood is then tested to be sure it is compatible with you in order to minimize the chance of a transfusion reaction. If you or a relative donates blood, this is often done in anticipation of surgery and is not appropriate for emergency situations. It takes many days to process the donated blood. RISKS AND COMPLICATIONS Although transfusion therapy is very safe and saves many lives, the main dangers of transfusion include:   Getting an infectious disease.  Developing a transfusion reaction. This is an allergic reaction to something in the blood you were given. Every precaution is taken to prevent this. The decision to have a blood transfusion has been considered carefully by your caregiver before blood is given. Blood is not given unless the benefits outweigh the risks. AFTER THE TRANSFUSION  Right after receiving a blood  transfusion, you will usually feel much better and more energetic. This is especially true if your red blood cells have gotten low (anemic). The transfusion raises the level of the red blood cells which carry oxygen, and this usually causes an  energy increase.  The nurse administering the transfusion will monitor you carefully for complications. HOME CARE INSTRUCTIONS  No special instructions are needed after a transfusion. You may find your energy is better. Speak with your caregiver about any limitations on activity for underlying diseases you may have. SEEK MEDICAL CARE IF:   Your condition is not improving after your transfusion.  You develop redness or irritation at the intravenous (IV) site. SEEK IMMEDIATE MEDICAL CARE IF:  Any of the following symptoms occur over the next 12 hours:  Shaking chills.  You have a temperature by mouth above 102 F (38.9 C), not controlled by medicine.  Chest, back, or muscle pain.  People around you feel you are not acting correctly or are confused.  Shortness of breath or difficulty breathing.  Dizziness and fainting.  You get a rash or develop hives.  You have a decrease in urine output.  Your urine turns a dark color or changes to pink, red, or brown. Any of the following symptoms occur over the next 10 days:  You have a temperature by mouth above 102 F (38.9 C), not controlled by medicine.  Shortness of breath.  Weakness after normal activity.  The white part of the eye turns yellow (jaundice).  You have a decrease in the amount of urine or are urinating less often.  Your urine turns a dark color or changes to pink, red, or brown. Document Released: 05/13/2000 Document Revised: 08/08/2011 Document Reviewed: 12/31/2007 [facility] Patient Information 2014 [place], [place].  _______________________________________________________________________

## 2014-09-08 ENCOUNTER — Encounter (HOSPITAL_COMMUNITY)
Admission: RE | Admit: 2014-09-08 | Discharge: 2014-09-08 | Disposition: A | Discharge status: Home / Self Care | Payer: Medicare Other | Source: Ambulatory Visit | Attending: Orthopedic Surgery | Admitting: Orthopedic Surgery

## 2014-09-08 ENCOUNTER — Ambulatory Visit: Payer: Self-pay | Admitting: Orthopedic Surgery

## 2014-09-08 ENCOUNTER — Encounter (HOSPITAL_COMMUNITY): Payer: Self-pay

## 2014-09-08 DIAGNOSIS — Z01818 Encounter for other preprocedural examination: Secondary | ICD-10-CM | POA: Insufficient documentation

## 2014-09-08 LAB — COMPREHENSIVE METABOLIC PANEL
ALT: 12 U/L (ref 0–53)
AST: 26 U/L (ref 0–37)
Albumin: 4.4 g/dL (ref 3.5–5.2)
Alkaline Phosphatase: 73 U/L (ref 39–117)
Anion gap: 7 (ref 5–15)
BUN: 14 mg/dL (ref 6–23)
CO2: 28 mmol/L (ref 19–32)
Calcium: 9.1 mg/dL (ref 8.4–10.5)
Chloride: 101 mmol/L (ref 96–112)
Creatinine, Ser: 1.01 mg/dL (ref 0.50–1.35)
GFR calc Af Amer: >90 mL/min (ref >90)
GFR calc non Af Amer: 78 mL/min — ABNORMAL LOW (ref >90)
Glucose, Bld: 97 mg/dL (ref 70–99)
Potassium: 4.6 mmol/L (ref 3.5–5.1)
Sodium: 136 mmol/L (ref 135–145)
Total Bilirubin: 0.8 mg/dL (ref 0.3–1.2)
Total Protein: 7.3 g/dL (ref 6.0–8.3)

## 2014-09-08 LAB — SURGICAL PCR SCREEN
MRSA, PCR: NEGATIVE
Staphylococcus aureus: NEGATIVE

## 2014-09-08 LAB — CBC
HCT: 44.4 % (ref 39.0–52.0)
Hemoglobin: 15 g/dL (ref 13.0–17.0)
MCH: 28.6 pg (ref 26.0–34.0)
MCHC: 33.8 g/dL (ref 30.0–36.0)
MCV: 84.6 fL (ref 78.0–100.0)
Platelets: 261 10*3/uL (ref 150–400)
RBC: 5.25 MIL/uL (ref 4.22–5.81)
RDW: 13.9 % (ref 11.5–15.5)
WBC: 3.7 10*3/uL — ABNORMAL LOW (ref 4.0–10.5)

## 2014-09-08 LAB — PROTIME-INR
INR: 0.97 (ref 0.00–1.49)
Prothrombin Time: 13 seconds (ref 11.6–15.2)

## 2014-09-08 LAB — APTT: aPTT: 38 seconds — ABNORMAL HIGH (ref 24–37)

## 2014-09-08 MED ORDER — ACETAMINOPHEN 10 MG/ML IV SOLN: 1000.0000 mg | Freq: Once | INTRAVENOUS | Status: AC

## 2014-09-08 MED ORDER — DEXAMETHASONE SODIUM PHOSPHATE 4 MG/ML IJ SOLN: 4.0000 mg | Freq: Once | INTRAMUSCULAR | Status: DC

## 2014-09-08 MED ORDER — SODIUM CHLORIDE 0.9 % IV SOLN: 4.0000 mg | Freq: Once | INTRAVENOUS | Status: DC

## 2014-09-08 NOTE — Progress Notes (Signed)
Need orders in EPIC.  Surgery on 09/18/2014.  Preop done on 09/08/2014 at 0830am.  Thank You.  I did CBC, CMP, PT, PTT per anesthesia guidelines.  Thank You.

## 2014-09-09 ENCOUNTER — Ambulatory Visit: Payer: Self-pay | Admitting: Orthopedic Surgery

## 2014-09-09 NOTE — H&P (Signed)
TOTAL HIP ADMISSION H&P  Patient is admitted for right total hip arthroplasty.  Subjective:  Chief Complaint: right hip pain  HPI: Jonathan [name], 59 y.o. male, has a history of pain and functional disability in the right hip(s) due to AVN and patient has failed non-surgical conservative treatments for greater than 12 weeks to include NSAID's and/or analgesics, flexibility and strengthening excercises, use of assistive devices and activity modification.  Onset of symptoms was gradual starting 1 years ago with gradually worsening course since that time.The patient noted no past surgery on the right hip(s).  Patient currently rates pain in the right hip at 10 out of 10 with activity. Patient has night pain, worsening of pain with activity and weight bearing, pain that interfers with activities of daily living and crepitus. Patient has evidence of AVN with collapse by imaging studies. This condition presents safety issues increasing the risk of falls. There is no current active infection.  Patient Active Problem List   Diagnosis Date Noted  . Piriformis syndrome of right side 03/05/2014  . Avascular necrosis of bones of both hips 03/05/2014  . Allergic rhinitis, cause unspecified 06/14/2013  . GAD (generalized anxiety disorder) 05/16/2013  . Hypertension 07/29/2011  . Routine general medical examination at a health care facility 07/14/2011  . Nonspecific abnormal electrocardiogram (ECG) (EKG) 07/14/2011  . HYPERTROPHY PROSTATE W/UR OBST & OTH LUTS 05/05/2010  . TOBACCO ABUSE 02/14/2008  . Spinal stenosis of lumbar region 02/14/2008   Past Medical History  Diagnosis Date  . Back pain, lumbosacral 2000  . Arthritis   . Anxiety   . Blind right eye     Past Surgical History  Procedure Laterality Date  . Spine surgery    . Hernia repair      Right inguinal  . Eye surgery  2007    Fractured bones repaired/blind in right eye     (Not in a hospital admission) Allergies  Allergen  Reactions  . Cortisone     REACTION: rash, "act different"    History  Substance Use Topics  . Smoking status: Former Smoker -- 0.10 packs/day for 30 years    Types: Cigarettes  . Smokeless tobacco: Never Used     Comment: hsa been quit sincie 08/29/2014   . Alcohol Use: Yes     Comment: occasional beer     Family History  Problem Relation Age of Onset  . Stroke Mother   . Arthritis Mother   . Stroke Father   . Hypertension Father   . Cancer Neg Hx   . COPD Neg Hx   . Heart disease Neg Hx   . Kidney disease Neg Hx      Review of Systems  Constitutional: Negative.   Eyes: Negative.   Respiratory: Negative.   Cardiovascular: Negative.   Gastrointestinal: Negative.   Genitourinary: Negative.   Musculoskeletal: Positive for back pain, joint pain and neck pain. Negative for myalgias and falls.  Skin: Negative.   Neurological: Negative.   Endo/Heme/Allergies: Negative.   Psychiatric/Behavioral: Negative.     Objective:  Physical Exam  Constitutional: He is oriented to person, place, and time. He appears well-developed and well-nourished.  HENT:  Head: Normocephalic and atraumatic.  Eyes: Conjunctivae and EOM are normal. Pupils are equal, round, and reactive to light.  Neck: Normal range of motion. Neck supple.  Cardiovascular: Normal rate, regular rhythm, normal heart sounds and intact distal pulses.  Exam reveals no gallop and no friction rub.   No murmur heard.  Respiratory: Effort normal and breath sounds normal. No respiratory distress. He has no wheezes. He has no rales.  GI: Soft. Bowel sounds are normal.  Genitourinary:  deferred  Musculoskeletal:       Right hip: He exhibits decreased range of motion, tenderness and crepitus.  Neurological: He is oriented to person, place, and time. He has normal reflexes.  Skin: Skin is warm and dry.  Psychiatric: He has a normal mood and affect. His behavior is normal. Judgment and thought content normal.     Vital signs  in last 24 hours: @VSRANGES @  Labs:   Estimated body mass index is 22.30 kg/(m^2) as calculated from the following:   Height as of 09/08/14: 6\' 1"  (1.854 m).   Weight as of 09/08/14: 76.658 kg (169 lb).   Imaging Review Plain radiographs demonstrate AVN with collapse of the right hip(s). The bone quality appears to be adequate for age and reported activity level.  Assessment/Plan:  AVN right hip, right hip(s)  The patient history, physical examination, clinical judgement of the provider and imaging studies are consistent with end stage degenerative joint disease of the right hip(s) and total hip arthroplasty is deemed medically necessary. The treatment options including medical management, injection therapy, arthroscopy and arthroplasty were discussed at length. The risks and benefits of total hip arthroplasty were presented and reviewed. The risks due to aseptic loosening, infection, stiffness, dislocation/subluxation,  thromboembolic complications and other imponderables were discussed.  The patient acknowledged the explanation, agreed to proceed with the plan and consent was signed. Patient is being admitted for inpatient treatment for surgery, pain control, PT, OT, prophylactic antibiotics, VTE prophylaxis, progressive ambulation and ADL's and discharge planning.The patient is planning to be discharged home with home health services

## 2014-09-18 ENCOUNTER — Inpatient Hospital Stay (HOSPITAL_COMMUNITY): Payer: Medicare Other

## 2014-09-18 ENCOUNTER — Inpatient Hospital Stay (HOSPITAL_COMMUNITY): Payer: Medicare Other | Admitting: Certified Registered Nurse Anesthetist

## 2014-09-18 ENCOUNTER — Encounter (HOSPITAL_COMMUNITY): Payer: Self-pay | Admitting: *Deleted

## 2014-09-18 ENCOUNTER — Encounter (HOSPITAL_COMMUNITY)
Admission: RE | Disposition: A | Discharge status: Home Health | Payer: Self-pay | Source: Ambulatory Visit | Attending: Orthopedic Surgery

## 2014-09-18 ENCOUNTER — Inpatient Hospital Stay (HOSPITAL_COMMUNITY)
Admission: RE | Admit: 2014-09-18 | Discharge: 2014-09-19 | DRG: 470 | Disposition: A | Discharge status: Home Health | Payer: Medicare Other | Source: Ambulatory Visit | Attending: Orthopedic Surgery | Admitting: Orthopedic Surgery

## 2014-09-18 DIAGNOSIS — Z01812 Encounter for preprocedural laboratory examination: Secondary | ICD-10-CM | POA: Diagnosis not present

## 2014-09-18 DIAGNOSIS — M87051 Idiopathic aseptic necrosis of right femur: Secondary | ICD-10-CM | POA: Diagnosis present

## 2014-09-18 DIAGNOSIS — M25551 Pain in right hip: Secondary | ICD-10-CM | POA: Diagnosis present

## 2014-09-18 DIAGNOSIS — M87052 Idiopathic aseptic necrosis of left femur: Secondary | ICD-10-CM

## 2014-09-18 DIAGNOSIS — M87059 Idiopathic aseptic necrosis of unspecified femur: Secondary | ICD-10-CM | POA: Diagnosis present

## 2014-09-18 DIAGNOSIS — M89751 Major osseous defect, right pelvic region and thigh: Secondary | ICD-10-CM | POA: Diagnosis present

## 2014-09-18 DIAGNOSIS — H5441 Blindness, right eye, normal vision left eye: Secondary | ICD-10-CM | POA: Diagnosis present

## 2014-09-18 DIAGNOSIS — Z09 Encounter for follow-up examination after completed treatment for conditions other than malignant neoplasm: Secondary | ICD-10-CM

## 2014-09-18 DIAGNOSIS — Z87891 Personal history of nicotine dependence: Secondary | ICD-10-CM

## 2014-09-18 DIAGNOSIS — M25559 Pain in unspecified hip: Secondary | ICD-10-CM

## 2014-09-18 HISTORY — PX: TOTAL HIP ARTHROPLASTY: SHX124

## 2014-09-18 LAB — URINE MICROSCOPIC-ADD ON

## 2014-09-18 LAB — ABO/RH: ABO/RH(D): B POS

## 2014-09-18 LAB — URINALYSIS, ROUTINE W REFLEX MICROSCOPIC
Bilirubin Urine: NEGATIVE
Glucose, UA: NEGATIVE mg/dL
Ketones, ur: NEGATIVE mg/dL
Leukocytes, UA: NEGATIVE
Nitrite: NEGATIVE
Protein, ur: NEGATIVE mg/dL
Specific Gravity, Urine: 1.019 (ref 1.005–1.030)
Urobilinogen, UA: 0.2 mg/dL (ref 0.0–1.0)
pH: 5.5 (ref 5.0–8.0)

## 2014-09-18 LAB — TYPE AND SCREEN
ABO/RH(D): B POS
Antibody Screen: NEGATIVE

## 2014-09-18 SURGERY — ARTHROPLASTY, HIP, TOTAL, ANTERIOR APPROACH
Anesthesia: Spinal | Laterality: Right

## 2014-09-18 MED ORDER — PHENOL 1.4 % MT LIQD: 1.0000 | OROMUCOSAL | Status: DC | PRN

## 2014-09-18 MED ORDER — METOCLOPRAMIDE HCL 10 MG PO TABS: 5.0000 mg | ORAL_TABLET | Freq: Three times a day (TID) | ORAL | Status: DC | PRN

## 2014-09-18 MED ORDER — LACTATED RINGERS IV SOLN
INTRAVENOUS | Status: DC
  Administered 2014-09-18: 15:00:00 via INTRAVENOUS
  Administered 2014-09-18: 1000 mL via INTRAVENOUS
  Administered 2014-09-18: 14:00:00 via INTRAVENOUS

## 2014-09-18 MED ORDER — ONDANSETRON HCL 4 MG/2ML IJ SOLN
INTRAMUSCULAR | Status: AC
  Filled 2014-09-18: qty 2

## 2014-09-18 MED ORDER — ACETAMINOPHEN 325 MG PO TABS: 650.0000 mg | ORAL_TABLET | Freq: Four times a day (QID) | ORAL | Status: DC | PRN

## 2014-09-18 MED ORDER — ONDANSETRON HCL 4 MG/2ML IJ SOLN
INTRAMUSCULAR | Status: DC | PRN
  Administered 2014-09-18: 4 mg via INTRAVENOUS

## 2014-09-18 MED ORDER — CHLORHEXIDINE GLUCONATE 4 % EX LIQD: 60.0000 mL | Freq: Once | CUTANEOUS | Status: DC

## 2014-09-18 MED ORDER — DEXAMETHASONE SODIUM PHOSPHATE 10 MG/ML IJ SOLN
10.0000 mg | Freq: Once | INTRAMUSCULAR | Status: DC
  Filled 2014-09-18: qty 1

## 2014-09-18 MED ORDER — LORATADINE 10 MG PO TABS
10.0000 mg | ORAL_TABLET | Freq: Every day | ORAL | Status: DC
  Administered 2014-09-19: 10 mg via ORAL
  Filled 2014-09-18: qty 1

## 2014-09-18 MED ORDER — CEFAZOLIN SODIUM-DEXTROSE 2-3 GM-% IV SOLR
2.0000 g | INTRAVENOUS | Status: AC
  Administered 2014-09-18: 2 g via INTRAVENOUS

## 2014-09-18 MED ORDER — HYDROGEN PEROXIDE 3 % EX SOLN
CUTANEOUS | Status: AC
  Filled 2014-09-18: qty 473

## 2014-09-18 MED ORDER — PROPOFOL 10 MG/ML IV BOLUS
INTRAVENOUS | Status: AC
  Filled 2014-09-18: qty 20

## 2014-09-18 MED ORDER — MENTHOL 3 MG MT LOZG: 1.0000 | LOZENGE | OROMUCOSAL | Status: DC | PRN

## 2014-09-18 MED ORDER — KETOROLAC TROMETHAMINE 30 MG/ML IJ SOLN
INTRAMUSCULAR | Status: DC | PRN
  Administered 2014-09-18: 30 mg via INTRAMUSCULAR

## 2014-09-18 MED ORDER — PROMETHAZINE HCL 25 MG/ML IJ SOLN: 6.2500 mg | INTRAMUSCULAR | Status: DC | PRN

## 2014-09-18 MED ORDER — TRANEXAMIC ACID 100 MG/ML IV SOLN
1000.0000 mg | INTRAVENOUS | Status: AC
  Administered 2014-09-18: 1000 mg via INTRAVENOUS
  Filled 2014-09-18: qty 10

## 2014-09-18 MED ORDER — PROPOFOL INFUSION 10 MG/ML OPTIME
INTRAVENOUS | Status: DC | PRN
  Administered 2014-09-18: 75 ug/kg/min via INTRAVENOUS

## 2014-09-18 MED ORDER — METOCLOPRAMIDE HCL 5 MG/ML IJ SOLN: 5.0000 mg | Freq: Three times a day (TID) | INTRAMUSCULAR | Status: DC | PRN

## 2014-09-18 MED ORDER — SODIUM CHLORIDE 0.9 % IJ SOLN
INTRAMUSCULAR | Status: DC | PRN
  Administered 2014-09-18: 29 mL

## 2014-09-18 MED ORDER — DOCUSATE SODIUM 100 MG PO CAPS
100.0000 mg | ORAL_CAPSULE | Freq: Two times a day (BID) | ORAL | Status: DC
  Administered 2014-09-18 – 2014-09-19 (×2): 100 mg via ORAL

## 2014-09-18 MED ORDER — ACETAMINOPHEN 10 MG/ML IV SOLN
1000.0000 mg | Freq: Once | INTRAVENOUS | Status: AC
  Administered 2014-09-18: 1000 mg via INTRAVENOUS
  Filled 2014-09-18: qty 100

## 2014-09-18 MED ORDER — ACETAMINOPHEN 650 MG RE SUPP: 650.0000 mg | Freq: Four times a day (QID) | RECTAL | Status: DC | PRN

## 2014-09-18 MED ORDER — MIDAZOLAM HCL 5 MG/5ML IJ SOLN
INTRAMUSCULAR | Status: DC | PRN
  Administered 2014-09-18: 2 mg via INTRAVENOUS

## 2014-09-18 MED ORDER — GLYCOPYRROLATE 0.2 MG/ML IJ SOLN
INTRAMUSCULAR | Status: DC | PRN
  Administered 2014-09-18 (×2): 0.2 mg via INTRAVENOUS

## 2014-09-18 MED ORDER — TAMSULOSIN HCL 0.4 MG PO CAPS
0.4000 mg | ORAL_CAPSULE | Freq: Every day | ORAL | Status: DC
  Administered 2014-09-18 – 2014-09-19 (×2): 0.4 mg via ORAL
  Filled 2014-09-18 (×2): qty 1

## 2014-09-18 MED ORDER — SODIUM CHLORIDE 0.9 % IR SOLN
Status: DC | PRN
  Administered 2014-09-18: 3000 mL

## 2014-09-18 MED ORDER — ALPRAZOLAM 1 MG PO TABS: 1.0000 mg | ORAL_TABLET | Freq: Three times a day (TID) | ORAL | Status: DC | PRN

## 2014-09-18 MED ORDER — ISOPROPYL ALCOHOL 70 % SOLN
Status: DC | PRN
Start: 2014-09-18 — End: 2014-09-18
  Administered 2014-09-18: 1 via TOPICAL

## 2014-09-18 MED ORDER — SODIUM CHLORIDE 0.9 % IR SOLN
Status: DC | PRN
  Administered 2014-09-18: 1000 mL

## 2014-09-18 MED ORDER — ASPIRIN EC 325 MG PO TBEC
325.0000 mg | DELAYED_RELEASE_TABLET | Freq: Two times a day (BID) | ORAL | Status: DC
  Administered 2014-09-19: 325 mg via ORAL
  Filled 2014-09-18 (×3): qty 1

## 2014-09-18 MED ORDER — DEXAMETHASONE SODIUM PHOSPHATE 10 MG/ML IJ SOLN
INTRAMUSCULAR | Status: DC | PRN
  Administered 2014-09-18: 10 mg via INTRAVENOUS

## 2014-09-18 MED ORDER — KETOROLAC TROMETHAMINE 15 MG/ML IJ SOLN
15.0000 mg | Freq: Four times a day (QID) | INTRAMUSCULAR | Status: DC
  Administered 2014-09-18: 15 mg via INTRAVENOUS
  Filled 2014-09-18 (×3): qty 1

## 2014-09-18 MED ORDER — FENTANYL CITRATE (PF) 100 MCG/2ML IJ SOLN: 25.0000 ug | INTRAMUSCULAR | Status: DC | PRN

## 2014-09-18 MED ORDER — KETOROLAC TROMETHAMINE 30 MG/ML IJ SOLN
INTRAMUSCULAR | Status: AC
  Filled 2014-09-18: qty 1

## 2014-09-18 MED ORDER — ONDANSETRON HCL 4 MG/2ML IJ SOLN: 4.0000 mg | Freq: Four times a day (QID) | INTRAMUSCULAR | Status: DC | PRN

## 2014-09-18 MED ORDER — SODIUM CHLORIDE 0.9 % IV SOLN
INTRAVENOUS | Status: DC
Start: 2014-09-18 — End: 2014-09-18

## 2014-09-18 MED ORDER — PROPOFOL 10 MG/ML IV BOLUS
INTRAVENOUS | Status: DC | PRN
  Administered 2014-09-18: 20 mg via INTRAVENOUS
  Administered 2014-09-18 (×5): 10 mg via INTRAVENOUS

## 2014-09-18 MED ORDER — CEFAZOLIN SODIUM-DEXTROSE 2-3 GM-% IV SOLR
INTRAVENOUS | Status: AC
  Filled 2014-09-18: qty 50

## 2014-09-18 MED ORDER — SODIUM CHLORIDE 0.9 % IJ SOLN
INTRAMUSCULAR | Status: AC
  Filled 2014-09-18: qty 50

## 2014-09-18 MED ORDER — SENNA 8.6 MG PO TABS: 2.0000 | ORAL_TABLET | Freq: Every day | ORAL | Status: DC

## 2014-09-18 MED ORDER — BUPIVACAINE-EPINEPHRINE (PF) 0.25% -1:200000 IJ SOLN
INTRAMUSCULAR | Status: AC
  Filled 2014-09-18: qty 30

## 2014-09-18 MED ORDER — MIDAZOLAM HCL 2 MG/2ML IJ SOLN
INTRAMUSCULAR | Status: AC
  Filled 2014-09-18: qty 2

## 2014-09-18 MED ORDER — DEXAMETHASONE SODIUM PHOSPHATE 10 MG/ML IJ SOLN
INTRAMUSCULAR | Status: AC
  Filled 2014-09-18: qty 1

## 2014-09-18 MED ORDER — LIDOCAINE HCL (CARDIAC) 20 MG/ML IV SOLN
INTRAVENOUS | Status: AC
  Filled 2014-09-18: qty 5

## 2014-09-18 MED ORDER — MEPERIDINE HCL 50 MG/ML IJ SOLN: 6.2500 mg | INTRAMUSCULAR | Status: DC | PRN

## 2014-09-18 MED ORDER — LACTATED RINGERS IV SOLN: INTRAVENOUS | Status: DC

## 2014-09-18 MED ORDER — BUPIVACAINE-EPINEPHRINE 0.25% -1:200000 IJ SOLN
INTRAMUSCULAR | Status: DC | PRN
  Administered 2014-09-18: 30 mL

## 2014-09-18 MED ORDER — FENTANYL CITRATE (PF) 250 MCG/5ML IJ SOLN
INTRAMUSCULAR | Status: AC
  Filled 2014-09-18: qty 5

## 2014-09-18 MED ORDER — GLYCOPYRROLATE 0.2 MG/ML IJ SOLN
INTRAMUSCULAR | Status: AC
  Filled 2014-09-18: qty 1

## 2014-09-18 MED ORDER — CEFAZOLIN SODIUM-DEXTROSE 2-3 GM-% IV SOLR
2.0000 g | Freq: Four times a day (QID) | INTRAVENOUS | Status: AC
  Administered 2014-09-18: 2 g via INTRAVENOUS
  Filled 2014-09-18 (×2): qty 50

## 2014-09-18 MED ORDER — ONDANSETRON HCL 4 MG PO TABS: 4.0000 mg | ORAL_TABLET | Freq: Four times a day (QID) | ORAL | Status: DC | PRN

## 2014-09-18 MED ORDER — HYDROMORPHONE HCL 1 MG/ML IJ SOLN: 0.5000 mg | INTRAMUSCULAR | Status: DC | PRN

## 2014-09-18 MED ORDER — ISOPROPYL ALCOHOL 70 % SOLN
Status: AC
  Filled 2014-09-18: qty 480

## 2014-09-18 MED ORDER — HYDROGEN PEROXIDE 3 % EX SOLN
CUTANEOUS | Status: DC | PRN
  Administered 2014-09-18: 1

## 2014-09-18 MED ORDER — BUPIVACAINE HCL (PF) 0.5 % IJ SOLN
INTRAMUSCULAR | Status: DC | PRN
  Administered 2014-09-18: 3 mL

## 2014-09-18 MED ORDER — HYDROCODONE-ACETAMINOPHEN 5-325 MG PO TABS
1.0000 | ORAL_TABLET | ORAL | Status: DC | PRN
  Administered 2014-09-18 (×2): 2 via ORAL
  Filled 2014-09-18 (×2): qty 2

## 2014-09-18 MED ORDER — FENTANYL CITRATE (PF) 100 MCG/2ML IJ SOLN
INTRAMUSCULAR | Status: DC | PRN
  Administered 2014-09-18 (×2): 50 ug via INTRAVENOUS

## 2014-09-18 MED ORDER — SODIUM CHLORIDE 0.9 % IV SOLN
INTRAVENOUS | Status: DC
  Administered 2014-09-18: 20:00:00 via INTRAVENOUS

## 2014-09-18 SURGICAL SUPPLY — 47 items
BAG DECANTER FOR FLEXI CONT (MISCELLANEOUS) IMPLANT
BAG SPEC THK2 15X12 ZIP CLS (MISCELLANEOUS)
BAG ZIPLOCK 12X15 (MISCELLANEOUS) IMPLANT
CAPT HIP TOTAL 2 ×1 IMPLANT
CHLORAPREP W/TINT 26ML (MISCELLANEOUS) ×2 IMPLANT
COVER PERINEAL POST (MISCELLANEOUS) ×2 IMPLANT
DECANTER SPIKE VIAL GLASS SM (MISCELLANEOUS) ×2 IMPLANT
DRAPE C-ARM 42X120 X-RAY (DRAPES) ×2 IMPLANT
DRAPE STERI IOBAN 125X83 (DRAPES) ×2 IMPLANT
DRAPE U-SHAPE 47X51 STRL (DRAPES) ×6 IMPLANT
DRSG AQUACEL AG ADV 3.5X10 (GAUZE/BANDAGES/DRESSINGS) ×2 IMPLANT
ELECT BLADE TIP CTD 4 INCH (ELECTRODE) ×2 IMPLANT
ELECT PENCIL ROCKER SW 15FT (MISCELLANEOUS) ×2 IMPLANT
ELECT REM PT RETURN 15FT ADLT (MISCELLANEOUS) ×2 IMPLANT
FACESHIELD WRAPAROUND (MASK) ×4 IMPLANT
FACESHIELD WRAPAROUND OR TEAM (MASK) ×2 IMPLANT
GAUZE SPONGE 4X4 12PLY STRL (GAUZE/BANDAGES/DRESSINGS) ×2 IMPLANT
GLOVE BIO SURGEON STRL SZ8.5 (GLOVE) ×4 IMPLANT
GLOVE BIOGEL PI IND STRL 8.5 (GLOVE) ×1 IMPLANT
GLOVE BIOGEL PI INDICATOR 8.5 (GLOVE) ×1
GOWN SPEC L3 XXLG W/TWL (GOWN DISPOSABLE) ×2 IMPLANT
HANDPIECE INTERPULSE COAX TIP (DISPOSABLE) ×2
HOLDER FOLEY CATH W/STRAP (MISCELLANEOUS) ×2 IMPLANT
HOOD PEEL AWAY FACE SHEILD DIS (HOOD) ×4 IMPLANT
KIT BASIN OR (CUSTOM PROCEDURE TRAY) ×2 IMPLANT
LIQUID BAND (GAUZE/BANDAGES/DRESSINGS) ×3 IMPLANT
NDL SPNL 18GX3.5 QUINCKE PK (NEEDLE) ×1 IMPLANT
NEEDLE SPNL 18GX3.5 QUINCKE PK (NEEDLE) ×2 IMPLANT
PACK TOTAL JOINT (CUSTOM PROCEDURE TRAY) ×2 IMPLANT
PEN SKIN MARKING BROAD (MISCELLANEOUS) ×2 IMPLANT
SAW OSC TIP CART 19.5X105X1.3 (SAW) ×2 IMPLANT
SEALER BIPOLAR AQUA 6.0 (INSTRUMENTS) ×2 IMPLANT
SET HNDPC FAN SPRY TIP SCT (DISPOSABLE) ×1 IMPLANT
SOL PREP POV-IOD 4OZ 10% (MISCELLANEOUS) ×2 IMPLANT
SUT ETHIBOND NAB CT1 #1 30IN (SUTURE) ×4 IMPLANT
SUT MNCRL AB 3-0 PS2 18 (SUTURE) ×2 IMPLANT
SUT MON AB 2-0 CT1 36 (SUTURE) ×4 IMPLANT
SUT VIC AB 1 CT1 36 (SUTURE) ×2 IMPLANT
SUT VIC AB 2-0 CT1 27 (SUTURE) ×2
SUT VIC AB 2-0 CT1 TAPERPNT 27 (SUTURE) ×1 IMPLANT
SUT VLOC 180 0 24IN GS25 (SUTURE) ×2 IMPLANT
SYR 50ML LL SCALE MARK (SYRINGE) ×2 IMPLANT
TOWEL OR 17X26 10 PK STRL BLUE (TOWEL DISPOSABLE) ×2 IMPLANT
TOWEL OR NON WOVEN STRL DISP B (DISPOSABLE) ×2 IMPLANT
TRAY FOLEY CATH 16FRSI W/METER (SET/KITS/TRAYS/PACK) ×1 IMPLANT
WATER STERILE IRR 1500ML POUR (IV SOLUTION) ×2 IMPLANT
YANKAUER SUCT BULB TIP 10FT TU (MISCELLANEOUS) ×2 IMPLANT

## 2014-09-18 NOTE — H&P (View-Only) (Signed)
TOTAL HIP ADMISSION H&P  Patient is admitted for right total hip arthroplasty.  Subjective:  Chief Complaint: right hip pain  HPI: Jonathan [name], 59 y.o. male, has a history of pain and functional disability in the right hip(s) due to AVN and patient has failed non-surgical conservative treatments for greater than 12 weeks to include NSAID's and/or analgesics, flexibility and strengthening excercises, use of assistive devices and activity modification.  Onset of symptoms was gradual starting 1 years ago with gradually worsening course since that time.The patient noted no past surgery on the right hip(s).  Patient currently rates pain in the right hip at 10 out of 10 with activity. Patient has night pain, worsening of pain with activity and weight bearing, pain that interfers with activities of daily living and crepitus. Patient has evidence of AVN with collapse by imaging studies. This condition presents safety issues increasing the risk of falls. There is no current active infection.  Patient Active Problem List   Diagnosis Date Noted  . Piriformis syndrome of right side 03/05/2014  . Avascular necrosis of bones of both hips 03/05/2014  . Allergic rhinitis, cause unspecified 06/14/2013  . GAD (generalized anxiety disorder) 05/16/2013  . Hypertension 07/29/2011  . Routine general medical examination at a health care facility 07/14/2011  . Nonspecific abnormal electrocardiogram (ECG) (EKG) 07/14/2011  . HYPERTROPHY PROSTATE W/UR OBST & OTH LUTS 05/05/2010  . TOBACCO ABUSE 02/14/2008  . Spinal stenosis of lumbar region 02/14/2008   Past Medical History  Diagnosis Date  . Back pain, lumbosacral 2000  . Arthritis   . Anxiety   . Blind right eye     Past Surgical History  Procedure Laterality Date  . Spine surgery    . Hernia repair      Right inguinal  . Eye surgery  2007    Fractured bones repaired/blind in right eye     (Not in a hospital admission) Allergies  Allergen  Reactions  . Cortisone     REACTION: rash, "act different"    History  Substance Use Topics  . Smoking status: Former Smoker -- 0.10 packs/day for 30 years    Types: Cigarettes  . Smokeless tobacco: Never Used     Comment: hsa been quit sincie 08/29/2014   . Alcohol Use: Yes     Comment: occasional beer     Family History  Problem Relation Age of Onset  . Stroke Mother   . Arthritis Mother   . Stroke Father   . Hypertension Father   . Cancer Neg Hx   . COPD Neg Hx   . Heart disease Neg Hx   . Kidney disease Neg Hx      Review of Systems  Constitutional: Negative.   Eyes: Negative.   Respiratory: Negative.   Cardiovascular: Negative.   Gastrointestinal: Negative.   Genitourinary: Negative.   Musculoskeletal: Positive for back pain, joint pain and neck pain. Negative for myalgias and falls.  Skin: Negative.   Neurological: Negative.   Endo/Heme/Allergies: Negative.   Psychiatric/Behavioral: Negative.     Objective:  Physical Exam  Constitutional: He is oriented to person, place, and time. He appears well-developed and well-nourished.  HENT:  Head: Normocephalic and atraumatic.  Eyes: Conjunctivae and EOM are normal. Pupils are equal, round, and reactive to light.  Neck: Normal range of motion. Neck supple.  Cardiovascular: Normal rate, regular rhythm, normal heart sounds and intact distal pulses.  Exam reveals no gallop and no friction rub.   No murmur heard.  Respiratory: Effort normal and breath sounds normal. No respiratory distress. He has no wheezes. He has no rales.  GI: Soft. Bowel sounds are normal.  Genitourinary:  deferred  Musculoskeletal:       Right hip: He exhibits decreased range of motion, tenderness and crepitus.  Neurological: He is oriented to person, place, and time. He has normal reflexes.  Skin: Skin is warm and dry.  Psychiatric: He has a normal mood and affect. His behavior is normal. Judgment and thought content normal.     Vital signs  in last 24 hours: @VSRANGES @  Labs:   Estimated body mass index is 22.30 kg/(m^2) as calculated from the following:   Height as of 09/08/14: 6\' 1"  (1.854 m).   Weight as of 09/08/14: 76.658 kg (169 lb).   Imaging Review Plain radiographs demonstrate AVN with collapse of the right hip(s). The bone quality appears to be adequate for age and reported activity level.  Assessment/Plan:  AVN right hip, right hip(s)  The patient history, physical examination, clinical judgement of the provider and imaging studies are consistent with end stage degenerative joint disease of the right hip(s) and total hip arthroplasty is deemed medically necessary. The treatment options including medical management, injection therapy, arthroscopy and arthroplasty were discussed at length. The risks and benefits of total hip arthroplasty were presented and reviewed. The risks due to aseptic loosening, infection, stiffness, dislocation/subluxation,  thromboembolic complications and other imponderables were discussed.  The patient acknowledged the explanation, agreed to proceed with the plan and consent was signed. Patient is being admitted for inpatient treatment for surgery, pain control, PT, OT, prophylactic antibiotics, VTE prophylaxis, progressive ambulation and ADL's and discharge planning.The patient is planning to be discharged home with home health services

## 2014-09-18 NOTE — Anesthesia Preprocedure Evaluation (Addendum)
Anesthesia Evaluation  Patient identified by MRN, date of birth, ID band Patient awake    Reviewed: Allergy & Precautions, NPO status , Patient's Chart, lab work & pertinent test results  Airway Mallampati: II  TM Distance: >3 FB Neck ROM: Full    Dental no notable dental hx.    Pulmonary neg pulmonary ROS, former smoker,  breath sounds clear to auscultation  Pulmonary exam normal       Cardiovascular negative cardio ROS  Rhythm:Regular Rate:Normal     Neuro/Psych negative neurological ROS  negative psych ROS   GI/Hepatic negative GI ROS, Neg liver ROS,   Endo/Other  negative endocrine ROS  Renal/GU negative Renal ROS  negative genitourinary   Musculoskeletal negative musculoskeletal ROS (+)   Abdominal   Peds negative pediatric ROS (+)  Hematology negative hematology ROS (+)   Anesthesia Other Findings   Reproductive/Obstetrics negative OB ROS                            Anesthesia Physical Anesthesia Plan  ASA: II  Anesthesia Plan: Spinal   Post-op Pain Management:    Induction: Intravenous  Airway Management Planned: Simple Face Mask  Additional Equipment:   Intra-op Plan:   Post-operative Plan: Extubation in OR  Informed Consent: I have reviewed the patients History and Physical, chart, labs and discussed the procedure including the risks, benefits and alternatives for the proposed anesthesia with the patient or authorized representative who has indicated his/her understanding and acceptance.   Dental advisory given  Plan Discussed with: CRNA  Anesthesia Plan Comments:         Anesthesia Quick Evaluation

## 2014-09-18 NOTE — Anesthesia Procedure Notes (Signed)
Spinal Patient location during procedure: OR Start time: 09/18/2014 12:44 PM End time: 09/18/2014 12:52 PM Staffing Anesthesiologist: Montez Hageman Resident/CRNA: Darlys Gales R Performed by: resident/CRNA  Preanesthetic Checklist Completed: patient identified, site marked, surgical consent, pre-op evaluation, timeout performed, IV checked, risks and benefits discussed and monitors and equipment checked Spinal Block Patient position: sitting Prep: Betadine Patient monitoring: heart rate, continuous pulse ox and blood pressure Location: L3-4 Injection technique: single-shot Needle Needle type: Spinocan  Needle gauge: 25 G Needle length: 9 cm Needle insertion depth: 7.5 cm Assessment Sensory level: T6 Additional Notes Expiration date of kit checked and confirmed. Patient tolerated procedure well, without complications. Lot [id number] Exp. 2017-10

## 2014-09-18 NOTE — Op Note (Signed)
OPERATIVE REPORT  SURGEON: Rod Can, MD   ASSISTANT: Jennette Bill, PA-C.  PREOPERATIVE DIAGNOSIS: Right hip avascular necrosis with collapse.   POSTOPERATIVE DIAGNOSIS: Right hip avascular necrosis with collapse.   PROCEDURE: Right total hip arthroplasty, anterior approach.   IMPLANTS: DePuy Tri Lock stem, size 8, hi offset. DePuy Pinnacle Cup, size 58 mm. DePuy Altrx liner, size 58 by 36 mm, neutral. DePuy Biolox ceramic head ball, size 36 + 1.5 mm. 6.82mm cancellous screw x1  ANESTHESIA:  Spinal  ESTIMATED BLOOD LOSS: 350 mL.  ANTIBIOTICS: 2g ancef.  DRAINS: None.  COMPLICATIONS: None.   CONDITION: PACU - hemodynamically stable.   SPECIMENS: femoral head for pathology.  BRIEF CLINICAL NOTE: Jonathan [name] is a 59 y.o. male with a long-standing history of Right hip AVN. After failing conservative management, the patient was indicated for total hip arthroplasty. The risks, benefits, and alternatives to the procedure were explained, and the patient elected to proceed.  PROCEDURE IN DETAIL: Surgical site was marked by myself. Spinal anesthesia was obtained in the pre-op holding area. Once inside the operative room, a foley catheter was inserted. The patient was then positioned on the Hana table. All bony prominences were well padded. The hip was prepped and draped in the normal sterile surgical fashion. A time-out was called verifying side and site of surgery. The patient received IV antibiotics within 60 minutes of beginning the procedure.  The direct anterior approach to the hip was performed through the Hueter interval. Lateral femoral circumflex vessels were treated with the Auqumantys. The anterior capsule was exposed and an inverted T capsulotomy was made.The femoral neck cut was made to the level of the templated cut. A corkscrew was placed into the head and the head was removed. The femoral head was found to be collapsed. The head was passed to the  back table and was measured.  Acetabular exposure was achieved, and the pulvinar and labrum were excised. Sequental reaming of the acetabulum was then performed up to a size 57 mm reamer. A 58 mm cup was then opened and impacted into place at approximately 40 degrees of abduction and 20 degrees of anteversion. Stability was excellent. I chose to place one 6.5 mm cancellous screw. The final polyethylene liner was impacted into place and acetabular osteophytes were removed.   I then gained femoral exposure taking care to protect the abductors and greater trochanter. This was performed using standard external rotation, extension, and adduction. The capsule was peeled off the inner aspect of the greater trochanter, taking care to preserve the short external rotators. A cookie cutter was used to enter the femoral canal, and then the femoral canal finder was placed. Sequential broaching was performed up to a size 8. Calcar planer was used on the femoral neck remnant. I paced a hi offset neck and a trial head ball. The hip was reduced. Leg lengths and offset were checked fluoroscopically. The hip was dislocated and trial components were removed. The final implants were placed, and the hip was reduced.  Fluoroscopy was used to confirm component position and leg lengths. At 90 degrees of external rotation and full extension, the hip was stable to an anterior directed force.  The wound was copiously irrigated with a dilute betadine solution followed by normal saline. Marcaine solution was injected into the periarticular soft tissue. The wound was closed in layers using #1 Vicryl and V-Loc for the fascia, 2-0 Vicryl for the subcutaneous fat, 2-0 Monocryl for the deep dermal layer, 3-0 running Monocryl  subcuticular stitch, and Dermabond for the skin. Once the glue was fully dried, an Aquacell Ag dressing was applied. The patient was transported to the recovery room in stable condition. Sponge, needle,  and instrument counts were correct at the end of the case x2. The patient tolerated the procedure well and there were no known complications.  Please note that a surgical assistant was a medical necessity for this procedure to perform it in a safe and expeditious manner. Assistant was necessary to provide appropriate retraction of vital neurovascular structures, to prevent femoral fracture, and to allow for anatomic placement of the prosthesis.

## 2014-09-18 NOTE — Discharge Instructions (Signed)
°Dr. Ermin Parisien °Joint Replacement Specialist °[facility] °[address] °[place], [place] [address] °[phone] ° ° °TOTAL HIP REPLACEMENT POSTOPERATIVE DIRECTIONS ° ° ° °Hip Rehabilitation, Guidelines Following Surgery  ° °WEIGHT BEARING °Weight bearing as tolerated with assist device (walker, cane, etc) as directed, use it as long as suggested by your surgeon or therapist, typically at least 4-6 weeks. ° °The results of a hip operation are greatly improved after range of motion and muscle strengthening exercises. Follow all safety measures which are given to protect your hip. If any of these exercises cause increased pain or swelling in your joint, decrease the amount until you are comfortable again. Then slowly increase the exercises. Call your caregiver if you have problems or questions.  ° °HOME CARE INSTRUCTIONS  °Most of the following instructions are designed to prevent the dislocation of your new hip.  °Remove items at home which could result in a fall. This includes throw rugs or furniture in walking pathways.  °Continue medications as instructed at time of discharge. °· You may have some home medications which will be placed on hold until you complete the course of blood thinner medication. °· You may start showering once you are discharged home. Do not remove your dressing. °Do not put on socks or shoes without following the instructions of your caregivers.   °Sit on chairs with arms. Use the chair arms to help push yourself up when arising.  °Arrange for the use of a toilet seat elevator so you are not sitting low.  °· Walk with walker as instructed.  °You may resume a sexual relationship in one month or when given the OK by your caregiver.  °Use walker as long as suggested by your caregivers.  °You may put full weight on your legs and walk as much as is comfortable. °Avoid periods of inactivity such as sitting longer than an hour when not asleep. This helps prevent  blood clots.  °You may return to work once you are cleared by your surgeon.  °Do not drive a car for 6 weeks or until released by your surgeon.  °Do not drive while taking narcotics.  °Wear elastic stockings for two weeks following surgery during the day but you may remove then at night.  °Make sure you keep all of your appointments after your operation with all of your doctors and caregivers. You should call the office at the above phone number and make an appointment for approximately two weeks after the date of your surgery. °Please pick up a stool softener and laxative for home use as long as you are requiring pain medications. °· ICE to the affected hip every three hours for 30 minutes at a time and then as needed for pain and swelling. Continue to use ice on the hip for pain and swelling from surgery. You may notice swelling that will progress down to the foot and ankle.  This is normal after surgery.  Elevate the leg when you are not up walking on it.   °It is important for you to complete the blood thinner medication as prescribed by your doctor. °· Continue to use the breathing machine which will help keep your temperature down.  It is common for your temperature to cycle up and down following surgery, especially at night when you are not up moving around and exerting yourself.  The breathing machine keeps your lungs expanded and your temperature down. ° °RANGE OF MOTION AND STRENGTHENING EXERCISES  °These exercises are   designed to help you keep full movement of your hip joint. Follow your caregiver's or physical therapist's instructions. Perform all exercises about fifteen times, three times per day or as directed. Exercise both hips, even if you have had only one joint replacement. These exercises can be done on a training (exercise) mat, on the floor, on a table or on a bed. Use whatever works the best and is most comfortable for you. Use music or television while you are exercising so that the exercises  are a pleasant break in your day. This will make your life better with the exercises acting as a break in routine you can look forward to.  °Lying on your back, slowly slide your foot toward your buttocks, raising your knee up off the floor. Then slowly slide your foot back down until your leg is straight again.  °Lying on your back spread your legs as far apart as you can without causing discomfort.  °Lying on your side, raise your upper leg and foot straight up from the floor as far as is comfortable. Slowly lower the leg and repeat.  °Lying on your back, tighten up the muscle in the front of your thigh (quadriceps muscles). You can do this by keeping your leg straight and trying to raise your heel off the floor. This helps strengthen the largest muscle supporting your knee.  °Lying on your back, tighten up the muscles of your buttocks both with the legs straight and with the knee bent at a comfortable angle while keeping your heel on the floor.  ° °SKILLED REHAB INSTRUCTIONS: °If the patient is transferred to a skilled rehab facility following release from the hospital, a list of the current medications will be sent to the facility for the patient to continue.  When discharged from the skilled rehab facility, please have the facility set up the patient's Home Health Physical Therapy prior to being released. Also, the skilled facility will be responsible for providing the patient with their medications at time of release from the facility to include their pain medication and their blood thinner medication. If the patient is still at the rehab facility at time of the two week follow up appointment, the skilled rehab facility will also need to assist the patient in arranging follow up appointment in our office and any transportation needs. ° °MAKE SURE YOU:  °Understand these instructions.  °Will watch your condition.  °Will get help right away if you are not doing well or get worse. ° °Pick up stool softner and  laxative for home use following surgery while on pain medications. °Do not remove your dressing. °The dressing is waterproof--it is OK to take showers. °Continue to use ice for pain and swelling after surgery. °Do not use any lotions or creams on the incision until instructed by your surgeon. °Total Hip Protocol. ° ° °

## 2014-09-18 NOTE — Progress Notes (Signed)
Pt heart rhythm sinus brady on arrival to PACU; rate 40-45; Robinul given by CRNA on arrival; see anesthesia record

## 2014-09-18 NOTE — Anesthesia Postprocedure Evaluation (Signed)
  Anesthesia Post-op Note  Patient: Jonathan [name]  Procedure(s) Performed: Procedure(s) (LRB): RIGHT TOTAL HIP ARTHROPLASTY ANTERIOR APPROACH (Right)  Patient Location: PACU  Anesthesia Type: Spinal  Level of Consciousness: awake and alert   Airway and Oxygen Therapy: Patient Spontanous Breathing  Post-op Pain: mild  Post-op Assessment: Post-op Vital signs reviewed, Patient's Cardiovascular Status Stable, Respiratory Function Stable, Patent Airway and No signs of Nausea or vomiting  Last Vitals:  Filed Vitals:   09/18/14 1700  BP: 132/88  Pulse: 47  Temp: 36.3 C  Resp: 12    Post-op Vital Signs: stable   Complications: No apparent anesthesia complications

## 2014-09-18 NOTE — Transfer of Care (Signed)
Immediate Anesthesia Transfer of Care Note  Patient: Jonathan [name]  Procedure(s) Performed: Procedure(s): RIGHT TOTAL HIP ARTHROPLASTY ANTERIOR APPROACH (Right)  Patient Location: PACU  Anesthesia Type:Spinal  Level of Consciousness:  sedated, patient cooperative and responds to stimulation  Airway & Oxygen Therapy:Patient Spontanous Breathing and Patient connected to face mask oxgen  Post-op Assessment:  Report given to PACU RN and Post -op Vital signs reviewed and stable  Post vital signs:  Reviewed and stable  Last Vitals:  Filed Vitals:   09/18/14 1043  BP: 125/69  Pulse: 62  Temp: 36.6 C  Resp: 20    Complications: No apparent anesthesia complications

## 2014-09-18 NOTE — Interval H&P Note (Signed)
History and Physical Interval Note:  09/18/2014 12:36 PM  [place]  has presented today for surgery, with the diagnosis of RIGHT HIP OA  The various methods of treatment have been discussed with the patient and family. After consideration of risks, benefits and other options for treatment, the patient has consented to  Procedure(s): RIGHT TOTAL HIP ARTHROPLASTY ANTERIOR APPROACH (Right) as a surgical intervention .  The patient's history has been reviewed, patient examined, no change in status, stable for surgery.  I have reviewed the patient's chart and labs.  Questions were answered to the patient's satisfaction.     Mercia Dowe, Horald Pollen

## 2014-09-19 ENCOUNTER — Encounter (HOSPITAL_COMMUNITY): Payer: Self-pay | Admitting: Orthopedic Surgery

## 2014-09-19 LAB — BASIC METABOLIC PANEL
Anion gap: 9 (ref 5–15)
BUN: 14 mg/dL (ref 6–23)
CO2: 24 mmol/L (ref 19–32)
Calcium: 8.7 mg/dL (ref 8.4–10.5)
Chloride: 101 mmol/L (ref 96–112)
Creatinine, Ser: 0.93 mg/dL (ref 0.50–1.35)
GFR calc Af Amer: >90 mL/min (ref >90)
GFR calc non Af Amer: 89 mL/min — ABNORMAL LOW (ref >90)
Glucose, Bld: 224 mg/dL — ABNORMAL HIGH (ref 70–99)
Potassium: 4 mmol/L (ref 3.5–5.1)
Sodium: 134 mmol/L — ABNORMAL LOW (ref 135–145)

## 2014-09-19 LAB — CBC
HCT: 39.1 % (ref 39.0–52.0)
Hemoglobin: 13.5 g/dL (ref 13.0–17.0)
MCH: 28.5 pg (ref 26.0–34.0)
MCHC: 34.5 g/dL (ref 30.0–36.0)
MCV: 82.5 fL (ref 78.0–100.0)
Platelets: 254 10*3/uL (ref 150–400)
RBC: 4.74 MIL/uL (ref 4.22–5.81)
RDW: 13.3 % (ref 11.5–15.5)
WBC: 15.4 10*3/uL — ABNORMAL HIGH (ref 4.0–10.5)

## 2014-09-19 MED ORDER — HYDROCODONE-ACETAMINOPHEN 5-325 MG PO TABS: 1.0000 | ORAL_TABLET | ORAL | Status: DC | PRN

## 2014-09-19 MED ORDER — MELOXICAM 15 MG PO TABS: 15.0000 mg | ORAL_TABLET | Freq: Every day | ORAL | Status: DC

## 2014-09-19 MED ORDER — ONDANSETRON HCL 4 MG PO TABS: 4.0000 mg | ORAL_TABLET | Freq: Four times a day (QID) | ORAL | Status: DC | PRN

## 2014-09-19 MED ORDER — ASPIRIN 325 MG PO TBEC: 325.0000 mg | DELAYED_RELEASE_TABLET | Freq: Two times a day (BID) | ORAL | Status: DC

## 2014-09-19 NOTE — Progress Notes (Signed)
RN reviewed discharge instructions with patient and family. All questions answered.  Paperwork and prescriptions given.   NT rolled patient down in wheelchair to family car.  

## 2014-09-19 NOTE — Progress Notes (Signed)
   Subjective:  Patient reports pain as mild.  Denies N/V/CP/SOB.  Objective:   VITALS:   Filed Vitals:   09/18/14 2020 09/18/14 2208 09/19/14 0141 09/19/14 0502  BP: 131/80 137/91 138/84 149/77  Pulse: 62 68 86 80  Temp: 97.2 F (36.2 C) 97.5 F (36.4 C) 98 F (36.7 C) 98.7 F (37.1 C)  TempSrc: Axillary Axillary Oral Oral  Resp: 16 16 16 16   Height:      Weight:      SpO2: 100% 100% 100% 100%    ABD soft Sensation intact distally Intact pulses distally Dorsiflexion/Plantar flexion intact Incision: dressing C/D/I Compartment soft   Lab Results  Component Value Date   WBC 15.4* 09/19/2014   HGB 13.5 09/19/2014   HCT 39.1 09/19/2014   MCV 82.5 09/19/2014   PLT 254 09/19/2014   BMET    Component Value Date/Time   NA 134* 09/19/2014 0408   K 4.0 09/19/2014 0408   CL 101 09/19/2014 0408   CO2 24 09/19/2014 0408   GLUCOSE 224* 09/19/2014 0408   BUN 14 09/19/2014 0408   CREATININE 0.93 09/19/2014 0408   CALCIUM 8.7 09/19/2014 0408   GFRNONAA 89* 09/19/2014 0408   GFRAA >90 09/19/2014 0408     Assessment/Plan: 1 Day Post-Op   Active Problems:   Avascular necrosis of bones of both hips   Avascular necrosis of bone of hip   WBAT with walker/crutches PT/OT PO pain control DVT ppx: ASA, SCDS, TEDs Advance diet Up with therapy D/C IV fluids Discharge home with home health   Nakari Bracknell, Horald Pollen 09/19/2014, 8:23 AM   Rod Can, MD Cell [phone]

## 2014-09-19 NOTE — Evaluation (Signed)
Physical Therapy Evaluation Patient Details Name: Jonathan [name] MRN: [id number] DOB: 09-13-1955 Today's Date: 09/19/2014   History of Present Illness  R THR  Clinical Impression  Pt s/p R THR presents with decreased R LE strength/ROM and post op discomfort limiting functional mobility.  Pt currently mobilizing at sup to MOD I level and eager to dc home with assist of family and follow up HHPT.    Follow Up Recommendations Home health PT    Equipment Recommendations  None recommended by PT    Recommendations for Other Services OT consult     Precautions / Restrictions Precautions Precautions: Fall Restrictions Weight Bearing Restrictions: No      Mobility  Bed Mobility Overal bed mobility: Modified Independent                Transfers Overall transfer level: Modified independent               General transfer comment: min cues for saftey  Ambulation/Gait Ambulation/Gait assistance: Supervision;Modified independent (Device/Increase time) Ambulation Distance (Feet): 400 Feet Assistive device: Crutches Gait Pattern/deviations: Step-to pattern;Step-through pattern;Trunk flexed     General Gait Details: min cues for initial sequence and positioni from crutches  Stairs Stairs: Yes Stairs assistance: Supervision Stair Management: One rail Right;Step to pattern;Forwards;With crutches Number of Stairs: 8 General stair comments: cues for sequence and foot/crutch placement  Wheelchair Mobility    Modified Rankin (Stroke Patients Only)       Balance                                             Pertinent Vitals/Pain Pain Assessment: 0-10 Pain Score: 2  Pain Location: R hip Pain Descriptors / Indicators: Sore Pain Intervention(s): Limited activity within patient's tolerance;Monitored during session    Home Living Family/patient expects to be discharged to:: Private residence Living Arrangements: Spouse/significant  other Available Help at Discharge: Family Type of Home: House Home Access: Stairs to enter Entrance Stairs-Rails: Psychiatric nurse of Steps: 4 Home Layout: One level Home Equipment: Cane - quad;Crutches      Prior Function Level of Independence: Independent with assistive device(s);Independent               Hand Dominance   Dominant Hand: Right    Extremity/Trunk Assessment   Upper Extremity Assessment: Overall WFL for tasks assessed           Lower Extremity Assessment: RLE deficits/detail RLE Deficits / Details: 3/5 hip strength with AAROM at hip to 110 flex and 20 abd    Cervical / Trunk Assessment: Normal  Communication   Communication: No difficulties  Cognition Arousal/Alertness: Awake/alert Behavior During Therapy: WFL for tasks assessed/performed Overall Cognitive Status: Within Functional Limits for tasks assessed                      General Comments      Exercises Total Joint Exercises Ankle Circles/Pumps: AROM;Both;20 reps;Supine Quad Sets: AROM;Both;10 reps;Supine Gluteal Sets: AROM;Both;10 reps;Supine Heel Slides: AAROM;AROM;20 reps;Supine;Right Hip ABduction/ADduction: AAROM;Right;15 reps;Supine      Assessment/Plan    PT Assessment All further PT needs can be met in the next venue of care  PT Diagnosis Difficulty walking   PT Problem List Decreased strength;Decreased knowledge of use of DME;Pain;Decreased range of motion;Decreased mobility  PT Treatment Interventions DME instruction;Gait training;Stair training;Functional mobility training;Therapeutic activities;Therapeutic exercise;Patient/family education  PT Goals (Current goals can be found in the Care Plan section) Acute Rehab PT Goals Patient Stated Goal: HOME PT Goal Formulation: All assessment and education complete, DC therapy    Frequency 7X/week   Barriers to discharge        Co-evaluation               End of Session   Activity  Tolerance: Patient tolerated treatment well Patient left: in bed;with call bell/phone within reach;with family/visitor present Nurse Communication: Mobility status         Time: 7183-6725 PT Time Calculation (min) (ACUTE ONLY): 41 min   Charges:   PT Evaluation $Initial PT Evaluation Tier I: 1 Procedure PT Treatments $Gait Training: 8-22 mins $Therapeutic Exercise: 8-22 mins   PT G Codes:        Shamaine Mulkern 10-13-14, 9:09 AM

## 2014-09-19 NOTE — Progress Notes (Signed)
OT Cancellation Note  Patient Details Name: Jonathan [name] MRN: [id number] DOB: Dec 22, 1955   Cancelled Treatment:    Reason Eval/Treat Not Completed: OT screened, no needs identified, will sign off.  Pt was able to dress himself, including socks and shoes. He feels he will be fine with standard commode.  Reviewed tub transfer: he plans to get a shower seat himself.  Alena Blankenbeckler 09/19/2014, 9:28 AM  Lesle Chris, OTR/L [phone] 09/19/2014

## 2014-09-19 NOTE — Discharge Summary (Signed)
Physician Discharge Summary  Patient ID: Jonathan [name] MRN: [id number] DOB/AGE: 12-29-1955 59 y.o.  Admit date: 09/18/2014 Discharge date: 09/19/2014  Admission Diagnoses:  <principal problem not specified>  Discharge Diagnoses:  Active Problems:   Avascular necrosis of bones of both hips   Avascular necrosis of bone of hip   Past Medical History  Diagnosis Date  . Back pain, lumbosacral 2000  . Arthritis   . Anxiety   . Blind right eye     Surgeries: Procedure(s): RIGHT TOTAL HIP ARTHROPLASTY ANTERIOR APPROACH on 09/18/2014   Consultants (if any):    Discharged Condition: Improved  Hospital Course: Jonathan [name] is an 59 y.o. male who was admitted 09/18/2014 with a diagnosis of AVN R hip and went to the operating room on 09/18/2014 and underwent the above named procedures.    He was given perioperative antibiotics:  Anti-infectives    Start     Dose/Rate Route Frequency Ordered Stop   09/18/14 2000  ceFAZolin (ANCEF) IVPB 2 g/50 mL premix     2 g 100 mL/hr over 30 Minutes Intravenous Every 6 hours 09/18/14 1656 09/19/14 0759   09/18/14 1045  ceFAZolin (ANCEF) IVPB 2 g/50 mL premix     2 g 100 mL/hr over 30 Minutes Intravenous On call to O.R. 09/18/14 1045 09/18/14 1258    .  He was given sequential compression devices, early ambulation, and aspirin for DVT prophylaxis.  He benefited maximally from the hospital stay and there were no complications.    Recent vital signs:  Filed Vitals:   09/19/14 0502  BP: 149/77  Pulse: 80  Temp: 98.7 F (37.1 C)  Resp: 16    Recent laboratory studies:  Lab Results  Component Value Date   HGB 13.5 09/19/2014   HGB 15.0 09/08/2014   HGB 16.2 08/14/2014   Lab Results  Component Value Date   WBC 15.4* 09/19/2014   PLT 254 09/19/2014   Lab Results  Component Value Date   INR 0.97 09/08/2014   Lab Results  Component Value Date   NA 134* 09/19/2014   K 4.0 09/19/2014   CL 101 09/19/2014   CO2 24 09/19/2014    BUN 14 09/19/2014   CREATININE 0.93 09/19/2014   GLUCOSE 224* 09/19/2014    Discharge Medications:     Medication List    STOP taking these medications        HYDROcodone-acetaminophen 10-325 MG per tablet  Commonly known as:  NORCO  Replaced by:  HYDROcodone-acetaminophen 5-325 MG per tablet     ibuprofen 800 MG tablet  Commonly known as:  ADVIL,MOTRIN      TAKE these medications        ALPRAZolam 1 MG tablet  Commonly known as:  XANAX  Take 1 tablet (1 mg total) by mouth 3 (three) times daily as needed for anxiety.     aspirin 325 MG EC tablet  Take 1 tablet (325 mg total) by mouth 2 (two) times daily after a meal.     Betamethasone Valerate 0.12 % foam  Commonly known as:  LUXIQ  Apply 1 Act topically 2 (two) times daily.     cetirizine 10 MG tablet  Commonly known as:  ZYRTEC  Take 1 tablet (10 mg total) by mouth daily.     HYDROcodone-acetaminophen 5-325 MG per tablet  Commonly known as:  NORCO/VICODIN  Take 1-2 tablets by mouth every 4 (four) hours as needed (breakthrough pain).     meloxicam 15 MG tablet  Commonly known as:  MOBIC  Take 1 tablet (15 mg total) by mouth daily.     ondansetron 4 MG tablet  Commonly known as:  ZOFRAN  Take 1 tablet (4 mg total) by mouth every 6 (six) hours as needed for nausea.     tamsulosin 0.4 MG Caps capsule  Commonly known as:  FLOMAX  Take 1 capsule (0.4 mg total) by mouth daily.        Diagnostic Studies: Dg Pelvis Portable  09/18/2014   CLINICAL DATA:  Status post right hip replacement.  EXAM: PORTABLE PELVIS 1-2 VIEWS  COMPARISON:  Plain films right hip 03/05/2014.  FINDINGS: The patient has a new right total hip arthroplasty. The device is located and no fracture is seen. Avascular necrosis left femoral head is identified as on the prior study.  IMPRESSION: Right total hip replacement without evidence of complication.  Avascular necrosis left femoral head.   Electronically Signed   By: Inge Rise M.D.    On: 09/18/2014 16:41   Dg C-arm 61-120 Min-no Report  09/18/2014   CLINICAL DATA: surg   C-ARM 61-120 MINUTES  Fluoroscopy was utilized by the requesting physician.  No radiographic  interpretation.    Dg Hip Unilat With Pelvis 1v Right  09/18/2014   CLINICAL DATA:  Total hip replacement.  EXAM: RIGHT HIP (WITH PELVIS) 1 VIEW  COMPARISON:  None.  FINDINGS: Total right hip replacement. No acute bony abnormality identified. Normal alignment.  IMPRESSION: Total right hip replacement.  No acute abnormality.   Electronically Signed   By: Marcello Moores  Register   On: 09/18/2014 15:58    Disposition: 01-Home or Self Care      Discharge Instructions    Call MD / Call 911    Complete by:  As directed   If you experience chest pain or shortness of breath, CALL 911 and be transported to the hospital emergency room.  If you develope a fever above 101 F, pus (white drainage) or increased drainage or redness at the wound, or calf pain, call your surgeon's office.     Constipation Prevention    Complete by:  As directed   Drink plenty of fluids.  Prune juice may be helpful.  You may use a stool softener, such as Colace (over the counter) 100 mg twice a day.  Use MiraLax (over the counter) for constipation as needed.     Diet - low sodium heart healthy    Complete by:  As directed      Driving restrictions    Complete by:  As directed   No driving for 6 weeks     Increase activity slowly as tolerated    Complete by:  As directed      Lifting restrictions    Complete by:  As directed   No lifting for 6 weeks     TED hose    Complete by:  As directed   Use stockings (TED hose) for 2 weeks on both leg(s).  You may remove them at night for sleeping.           Follow-up Information    Follow up with Joyelle Siedlecki, Horald Pollen, MD. Schedule an appointment as soon as possible for a visit in 2 weeks.   Specialty:  Orthopedic Surgery   Why:  For wound re-check   Contact information:   [address] [address] [phone]        Signed: Elie Goody 09/19/2014, 8:30  AM

## 2014-09-30 ENCOUNTER — Telehealth: Payer: Self-pay | Admitting: Internal Medicine

## 2014-09-30 DIAGNOSIS — M48061 Spinal stenosis, lumbar region without neurogenic claudication: Secondary | ICD-10-CM

## 2014-09-30 MED ORDER — HYDROCODONE-ACETAMINOPHEN 10-325 MG PO TABS: 1.0000 | ORAL_TABLET | Freq: Four times a day (QID) | ORAL | Status: DC | PRN

## 2014-09-30 NOTE — Telephone Encounter (Signed)
Pt called in needing refill on his HYDROcodone-acetaminophen (NORCO/VICODIN).  He said that the surgeon gave him 5's when he had the surgery but he stated that he did not pick those up.  He said that he gets the 10s for [name] and is now out.

## 2014-09-30 NOTE — Telephone Encounter (Signed)
done

## 2014-09-30 NOTE — Telephone Encounter (Signed)
Pt notified to pick up//lmovm

## 2014-10-13 ENCOUNTER — Other Ambulatory Visit (INDEPENDENT_AMBULATORY_CARE_PROVIDER_SITE_OTHER): Payer: Medicare Other

## 2014-10-13 ENCOUNTER — Encounter: Payer: Self-pay | Admitting: Internal Medicine

## 2014-10-13 ENCOUNTER — Ambulatory Visit (INDEPENDENT_AMBULATORY_CARE_PROVIDER_SITE_OTHER): Payer: Medicare Other | Admitting: Internal Medicine

## 2014-10-13 VITALS — BP 108/66 | HR 62 | Temp 98.1°F | Resp 16 | Ht 73.0 in | Wt 164.0 lb

## 2014-10-13 DIAGNOSIS — Z86010 Personal history of colonic polyps: Secondary | ICD-10-CM | POA: Insufficient documentation

## 2014-10-13 DIAGNOSIS — M4806 Spinal stenosis, lumbar region: Secondary | ICD-10-CM

## 2014-10-13 DIAGNOSIS — Z79899 Other long term (current) drug therapy: Secondary | ICD-10-CM

## 2014-10-13 DIAGNOSIS — M48061 Spinal stenosis, lumbar region without neurogenic claudication: Secondary | ICD-10-CM

## 2014-10-13 DIAGNOSIS — R739 Hyperglycemia, unspecified: Secondary | ICD-10-CM | POA: Insufficient documentation

## 2014-10-13 LAB — BASIC METABOLIC PANEL
BUN: 9 mg/dL (ref 6–23)
CO2: 29 mEq/L (ref 19–32)
Calcium: 10 mg/dL (ref 8.4–10.5)
Chloride: 103 mEq/L (ref 96–112)
Creatinine, Ser: 0.83 mg/dL (ref 0.40–1.50)
GFR: 121.07 mL/min (ref >60.00)
Glucose, Bld: 95 mg/dL (ref 70–99)
Potassium: 3.9 mEq/L (ref 3.5–5.1)
Sodium: 136 mEq/L (ref 135–145)

## 2014-10-13 LAB — LIPID PANEL
Cholesterol: 182 mg/dL (ref 0–200)
HDL: 59.6 mg/dL (ref >39.00)
LDL Cholesterol: 112 mg/dL — ABNORMAL HIGH (ref 0–99)
NonHDL: 122.4
Total CHOL/HDL Ratio: 3
Triglycerides: 53 mg/dL (ref 0.0–149.0)
VLDL: 10.6 mg/dL (ref 0.0–40.0)

## 2014-10-13 LAB — HEMOGLOBIN A1C: Hgb A1c MFr Bld: 5.2 % (ref 4.6–6.5)

## 2014-10-13 MED ORDER — HYDROCODONE-ACETAMINOPHEN 10-325 MG PO TABS: 1.0000 | ORAL_TABLET | Freq: Four times a day (QID) | ORAL | Status: DC | PRN

## 2014-10-13 NOTE — Progress Notes (Deleted)
Patient ID: Jonathan [name], male   DOB: 08-24-1955, 59 y.o.   MRN: [id number]

## 2014-10-13 NOTE — Patient Instructions (Signed)
Back Pain, Adult Low back pain is very common. About 1 in 5 people have back pain.The cause of low back pain is rarely dangerous. The pain often gets better over time.About half of people with a sudden onset of back pain feel better in just 2 weeks. About 8 in 10 people feel better by 6 weeks.  CAUSES Some common causes of back pain include:  Strain of the muscles or ligaments supporting the spine.  Wear and tear (degeneration) of the spinal discs.  Arthritis.  Direct injury to the back. DIAGNOSIS Most of the time, the direct cause of low back pain is not known.However, back pain can be treated effectively even when the exact cause of the pain is unknown.Answering your caregiver's questions about your overall health and symptoms is one of the most accurate ways to make sure the cause of your pain is not dangerous. If your caregiver needs more information, he or she may order lab work or imaging tests (X-rays or MRIs).However, even if imaging tests show changes in your back, this usually does not require surgery. HOME CARE INSTRUCTIONS For many people, back pain returns.Since low back pain is rarely dangerous, it is often a condition that people can learn to manageon their own.   Remain active. It is stressful on the back to sit or stand in one place. Do not sit, drive, or stand in one place for more than 30 minutes at a time. Take short walks on level surfaces as soon as pain allows.Try to increase the length of time you walk each day.  Do not stay in bed.Resting more than 1 or 2 days can delay your recovery.  Do not avoid exercise or work.Your body is made to move.It is not dangerous to be active, even though your back may hurt.Your back will likely heal faster if you return to being active before your pain is gone.  Pay attention to your body when you bend and lift. Many people have less discomfortwhen lifting if they bend their knees, keep the load close to their bodies,and  avoid twisting. Often, the most comfortable positions are those that put less stress on your recovering back.  Find a comfortable position to sleep. Use a firm mattress and lie on your side with your knees slightly bent. If you lie on your back, put a pillow under your knees.  Only take over-the-counter or prescription medicines as directed by your caregiver. Over-the-counter medicines to reduce pain and inflammation are often the most helpful.Your caregiver may prescribe muscle relaxant drugs.These medicines help dull your pain so you can more quickly return to your normal activities and healthy exercise.  Put ice on the injured area.  Put ice in a plastic bag.  Place a towel between your skin and the bag.  Leave the ice on for 15-20 minutes, 03-04 times a day for the first 2 to 3 days. After that, ice and heat may be alternated to reduce pain and spasms.  Ask your caregiver about trying back exercises and gentle massage. This may be of some benefit.  Avoid feeling anxious or stressed.Stress increases muscle tension and can worsen back pain.It is important to recognize when you are anxious or stressed and learn ways to manage it.Exercise is a great option. SEEK MEDICAL CARE IF:  You have pain that is not relieved with rest or medicine.  You have pain that does not improve in 1 week.  You have new symptoms.  You are generally not feeling well. SEEK   IMMEDIATE MEDICAL CARE IF:   You have pain that radiates from your back into your legs.  You develop new bowel or bladder control problems.  You have unusual weakness or numbness in your arms or legs.  You develop nausea or vomiting.  You develop abdominal pain.  You feel faint. Document Released: 05/16/2005 Document Revised: 11/15/2011 Document Reviewed: 09/17/2013 Exit[employer] Patient Information 2015 [employer]. This information is not intended to replace advice given to you by your health care provider. Make sure you  discuss any questions you have with your health care provider.  

## 2014-10-13 NOTE — Progress Notes (Signed)
Subjective:  Patient ID: Jonathan [name], male    DOB: [birth year]  Age: 59 y.o. MRN: [id number]  CC: Back Pain   HPI Jonathan [name] presents for follow-up regarding low back pain. He is recently s/p right THR and that is going well but he complains of persistent episodes of sharp low back pain. He does not want to see a surgeon or do any PT for the low back at this time.  Outpatient Prescriptions Prior to Visit  Medication Sig Dispense Refill  . ALPRAZolam (XANAX) 1 MG tablet Take 1 tablet (1 mg total) by mouth 3 (three) times daily as needed for anxiety. 90 tablet 3  . aspirin EC 325 MG EC tablet Take 1 tablet (325 mg total) by mouth 2 (two) times daily after a meal. 60 tablet 0  . Betamethasone Valerate (LUXIQ) 0.12 % foam Apply 1 Act topically 2 (two) times daily. 100 g 2  . cetirizine (ZYRTEC) 10 MG tablet Take 1 tablet (10 mg total) by mouth daily. 30 tablet 11  . meloxicam (MOBIC) 15 MG tablet Take 1 tablet (15 mg total) by mouth daily. 30 tablet 3  . tamsulosin (FLOMAX) 0.4 MG CAPS capsule Take 1 capsule (0.4 mg total) by mouth daily. 30 capsule 11  . HYDROcodone-acetaminophen (NORCO) 10-325 MG per tablet Take 1 tablet by mouth every 6 (six) hours as needed. 65 tablet 0   No facility-administered medications prior to visit.    ROS Review of Systems  Constitutional: Negative for fever, chills, diaphoresis, appetite change and fatigue.  HENT: Negative.   Eyes: Negative.   Respiratory: Negative.  Negative for cough, choking, chest tightness, shortness of breath and stridor.   Cardiovascular: Negative.  Negative for chest pain, palpitations and leg swelling.  Gastrointestinal: Negative.  Negative for nausea, vomiting, abdominal pain, diarrhea, constipation and blood in stool.  Endocrine: Negative.  Negative for polydipsia, polyphagia and polyuria.  Genitourinary: Negative.   Musculoskeletal: Positive for back pain.  Skin: Negative.   Allergic/Immunologic: Negative.     Neurological: Negative.   Hematological: Negative.  Negative for adenopathy. Does not bruise/bleed easily.  Psychiatric/Behavioral: Negative.     Objective:  BP 108/66 mmHg  Pulse 62  Temp(Src) 98.1 F (36.7 C) (Oral)  Resp 16  Ht 6\' 1"  (1.854 m)  Wt 164 lb (74.39 kg)  BMI 21.64 kg/m2  SpO2 98%  BP Readings from Last 3 Encounters:  10/13/14 108/66  08/25/14 124/84  05/12/14 138/88    Wt Readings from Last 3 Encounters:  10/13/14 164 lb (74.39 kg)  08/25/14 171 lb (77.565 kg)  05/12/14 165 lb (74.844 kg)    Physical Exam  Constitutional: He is oriented to person, place, and time. He appears well-developed and well-nourished. No distress.  HENT:  Head: Normocephalic and atraumatic.  Mouth/Throat: Oropharynx is clear and moist. No oropharyngeal exudate.  Eyes: Conjunctivae are normal. Right eye exhibits no discharge. Left eye exhibits no discharge. No scleral icterus.  Neck: Normal range of motion. Neck supple. No JVD present. No tracheal deviation present. No thyromegaly present.  Cardiovascular: Normal rate, regular rhythm, normal heart sounds and intact distal pulses.  Exam reveals no gallop and no friction rub.   No murmur heard. Pulmonary/Chest: Effort normal and breath sounds normal. No stridor. No respiratory distress. He has no wheezes. He has no rales. He exhibits no tenderness.  Abdominal: Soft. Bowel sounds are normal. He exhibits no distension and no mass. There is no tenderness. There is no rebound and no  guarding.  Musculoskeletal: Normal range of motion. He exhibits no edema or tenderness.       Lumbar back: Normal. He exhibits normal range of motion, no tenderness, no bony tenderness, no swelling, no edema, no deformity, no laceration, no pain, no spasm and normal pulse.  Rt hip surgical sight looks good  Lymphadenopathy:    He has no cervical adenopathy.  Neurological: He is oriented to person, place, and time. He displays normal reflexes.  Reflex  Scores:      Tricep reflexes are 1+ on the right side and 1+ on the left side.      Bicep reflexes are 1+ on the right side and 1+ on the left side.      Brachioradialis reflexes are 1+ on the right side and 1+ on the left side.      Patellar reflexes are 1+ on the right side and 1+ on the left side.      Achilles reflexes are 1+ on the right side and 1+ on the left side. Neg SLR in BLE  Skin: Skin is warm and dry. No rash noted. He is not diaphoretic. No erythema. No pallor.  Vitals reviewed.   Lab Results  Component Value Date   WBC 15.4* 09/19/2014   HGB 13.5 09/19/2014   HCT 39.1 09/19/2014   PLT 254 09/19/2014   GLUCOSE 95 10/13/2014   CHOL 182 10/13/2014   TRIG 53.0 10/13/2014   HDL 59.60 10/13/2014   LDLDIRECT 131.6 07/14/2011   LDLCALC 112* 10/13/2014   ALT 12 09/08/2014   AST 26 09/08/2014   NA 136 10/13/2014   K 3.9 10/13/2014   CL 103 10/13/2014   CREATININE 0.83 10/13/2014   BUN 9 10/13/2014   CO2 29 10/13/2014   TSH 0.65 07/14/2011   PSA 1.57 09/07/2012   INR 0.97 09/08/2014   HGBA1C 5.2 10/13/2014    No results found.  Assessment & Plan:   Jonathan [name] was seen today for back pain.  Diagnoses and all orders for this visit:  Hyperglycemia Orders: -     Basic metabolic panel; Future -     Lipid panel; Future -     Hemoglobin A1c; Future  Spinal stenosis of lumbar region Orders: -     Discontinue: HYDROcodone-acetaminophen (NORCO) 10-325 MG per tablet; Take 1 tablet by mouth every 6 (six) hours as needed. -     Discontinue: HYDROcodone-acetaminophen (NORCO) 10-325 MG per tablet; Take 1 tablet by mouth every 6 (six) hours as needed. -     HYDROcodone-acetaminophen (NORCO) 10-325 MG per tablet; Take 1 tablet by mouth every 6 (six) hours as needed.  History of colonic polyps Orders: -     Ambulatory referral to Gastroenterology   I am having Jonathan [name] maintain his cetirizine, tamsulosin, ALPRAZolam, Betamethasone Valerate, aspirin, meloxicam, and  HYDROcodone-acetaminophen.  Meds ordered this encounter  Medications  . DISCONTD: HYDROcodone-acetaminophen (NORCO) 10-325 MG per tablet    Sig: Take 1 tablet by mouth every 6 (six) hours as needed.    Dispense:  90 tablet    Refill:  0    Fill on or after 10/13/14  . DISCONTD: HYDROcodone-acetaminophen (NORCO) 10-325 MG per tablet    Sig: Take 1 tablet by mouth every 6 (six) hours as needed.    Dispense:  90 tablet    Refill:  0    Fill on or after 11/13/14  . HYDROcodone-acetaminophen (NORCO) 10-325 MG per tablet    Sig: Take 1 tablet by mouth every  6 (six) hours as needed.    Dispense:  90 tablet    Refill:  0    Fill on or after 12/13/14     Follow-up: Return in about 3 months (around 01/13/2015).  Scarlette Calico, MD

## 2014-10-13 NOTE — Progress Notes (Signed)
Pre visit review using our clinic review tool, if applicable. No additional management support is needed unless otherwise documented below in the visit note. 

## 2014-10-15 ENCOUNTER — Telehealth: Payer: Self-pay | Admitting: Internal Medicine

## 2014-10-15 ENCOUNTER — Ambulatory Visit: Payer: Medicare Other | Admitting: Rehabilitation

## 2014-10-15 NOTE — Telephone Encounter (Signed)
Pt called stated that Dr. [name] gave him hydrocodone for 90 pill and it need to be 120 pill and Alprazolam 120 pill but it need to be 90 pill. Can we fix this? Please use [facility] and call pt

## 2014-10-18 NOTE — Telephone Encounter (Signed)
Will leave it the way it is for Will change next time

## 2014-10-31 ENCOUNTER — Encounter: Payer: Self-pay | Admitting: *Deleted

## 2014-11-03 ENCOUNTER — Ambulatory Visit (INDEPENDENT_AMBULATORY_CARE_PROVIDER_SITE_OTHER): Payer: Medicare Other | Admitting: Physician Assistant

## 2014-11-03 ENCOUNTER — Encounter: Payer: Self-pay | Admitting: Physician Assistant

## 2014-11-03 VITALS — BP 110/66 | HR 70 | Ht 72.0 in | Wt 162.0 lb

## 2014-11-03 DIAGNOSIS — K6289 Other specified diseases of anus and rectum: Secondary | ICD-10-CM | POA: Diagnosis not present

## 2014-11-03 NOTE — Patient Instructions (Signed)
Call us if anything changes. If you have recurrent bleeding or feel something at the rectal area call us. Follow up in 08/28/2016 for a colonoscopy with Dr. Erskine Emery.  You will receive a reminder letter in March/2018.

## 2014-11-03 NOTE — Progress Notes (Signed)
Patient ID: DANYAL WHITENACK, male   DOB: October 30, 1955, 59 y.o.   MRN: [id number]   Subjective:    Patient ID: Dimitri Ped, male    DOB: 1955-12-18, 59 y.o.   MRN: [id number]  HPI Jaleal is a 59 yo male known previously to Dr. Sharlett Iles from colonoscopy done in April 2013 for routine screening. He had 10.5 cm flat right colon polyp removed and was noted to have mild diverticular changes in the left colon and hypertrophied anal papillae. Path from the right colon polyp consistent with a hyperplastic polyp. He is to have 5 year interval follow-up. Patient comes in today stating that he noticed a very small protrusion from his rectum 6 or 7 months ago. He says this has not been tender or painful, just aware that it was there. Said about 6 months ago. Had just a scant amount of rectal bleeding but has not had any symptoms at all over the past several months since. His bowel habits have been normal no melena or hematochezia, no complaints of abdominal pain. He says he was concerned about what this might be and decided to make an appointment.  Review of Systems Pertinent positive and negative review of systems were noted in the above HPI section.  All other review of systems was otherwise negative.  Outpatient Encounter Prescriptions as of 11/03/2014  Medication Sig  . HYDROcodone-acetaminophen (NORCO) 10-325 MG per tablet Take 1 tablet by mouth every 6 (six) hours as needed.  . ALPRAZolam (XANAX) 1 MG tablet Take 1 tablet (1 mg total) by mouth 3 (three) times daily as needed for anxiety. (Patient not taking: Reported on 11/03/2014)  . aspirin EC 325 MG EC tablet Take 1 tablet (325 mg total) by mouth 2 (two) times daily after a meal. (Patient not taking: Reported on 11/03/2014)  . Betamethasone Valerate (LUXIQ) 0.12 % foam Apply 1 Act topically 2 (two) times daily. (Patient not taking: Reported on 11/03/2014)  . cetirizine (ZYRTEC) 10 MG tablet Take 1 tablet (10 mg total) by mouth daily. (Patient not taking:  Reported on 11/03/2014)  . meloxicam (MOBIC) 15 MG tablet Take 1 tablet (15 mg total) by mouth daily. (Patient not taking: Reported on 11/03/2014)  . tamsulosin (FLOMAX) 0.4 MG CAPS capsule Take 1 capsule (0.4 mg total) by mouth daily. (Patient not taking: Reported on 11/03/2014)   No facility-administered encounter medications on file as of 11/03/2014.   Allergies  Allergen Reactions  . Cortisone     REACTION: rash, "act different"   Patient Active Problem List   Diagnosis Date Noted  . Hyperglycemia 10/13/2014  . History of colonic polyps 10/13/2014  . Avascular necrosis of bones of both hips 03/05/2014  . Allergic rhinitis, cause unspecified 06/14/2013  . GAD (generalized anxiety disorder) 05/16/2013  . Routine general medical examination at a health care facility 07/14/2011  . Nonspecific abnormal electrocardiogram (ECG) (EKG) 07/14/2011  . HYPERTROPHY PROSTATE W/UR OBST & OTH LUTS 05/05/2010  . TOBACCO ABUSE 02/14/2008  . Spinal stenosis of lumbar region 02/14/2008   History   Social History  . Marital Status: Divorced    Spouse Name: N/A  . Number of Children: 1  . Years of Education: N/A   Occupational History  .     Social History Main Topics  . Smoking status: Former Smoker -- 0.10 packs/day for 30 years    Types: Cigarettes  . Smokeless tobacco: Never Used     Comment: hsa been quit sincie 08/29/2014   . Alcohol  Use: Yes     Comment: occasional beer   . Drug Use: Yes    Special: Marijuana  . Sexual Activity: Yes   Other Topics Concern  . Not on file   Social History Narrative   Lives with wife.    Mr. Leeper family history includes Arthritis in his mother; Hypertension in his father; Stroke in his father and mother. There is no history of Cancer, COPD, Heart disease, or Kidney disease.      Objective:    Filed Vitals:   11/03/14 1006  BP: 110/66  Pulse: 70    Physical Exam well-developed older African-American male in no acute distress, pleasant  blood pressure 110/66 pulse 70 height 6 foot weight 162. HEENT; nontraumatic normocephalic EOMI PERRLA sclera anicteric, Cardiovascular; regular rate and rhythm with S1-S2 no murmur or gallop, Pulmonary; clear bilaterally, Abdomen ;and nondistended bowel sounds are active no palpable mass or hepatosplenomegaly, Rectal ;exam there is a very small pinkish white protrusion from the anus, nontender on digital exam and with anoscopy this appears to be a hypertrophied anal papillae, Extremities ;no clubbing cyanosis or edema skin warm dry, Psych; mood and affect appropriate       Assessment & Plan:   #1 59 yo male with small hypertrophied anal papillae which protrudes at anus #2 hyperplastic right colon polyp 2013 #3 diverticulosis  Plan; Discussion with pt  Regarding benign nature of enlarge papillae, offered surgical referral  Which he declines at this time. He is asked to return to office if it  Becomes tender, enlarges or bleeds-otherwise will see in follow up for colonoscopy in 2018 - Dr. Winfield Cunas PA-C 11/03/2014   Cc: Janith Lima, MD

## 2014-11-05 NOTE — Progress Notes (Signed)
Reviewed and agree with management. Alohilani Levenhagen D. Demiyah Fischbach, M.D., FACG  

## 2014-11-05 NOTE — Progress Notes (Signed)
Reviewed and agree with management. Nizar Cutler D. Ghina Bittinger, M.D., FACG  

## 2015-01-08 ENCOUNTER — Other Ambulatory Visit: Payer: Self-pay | Admitting: Internal Medicine

## 2015-01-08 ENCOUNTER — Ambulatory Visit (INDEPENDENT_AMBULATORY_CARE_PROVIDER_SITE_OTHER): Payer: Medicare Other | Admitting: Internal Medicine

## 2015-01-08 ENCOUNTER — Encounter: Payer: Self-pay | Admitting: Internal Medicine

## 2015-01-08 VITALS — BP 142/80 | HR 76 | Temp 97.8°F | Resp 16 | Ht 72.0 in | Wt 167.0 lb

## 2015-01-08 DIAGNOSIS — L259 Unspecified contact dermatitis, unspecified cause: Secondary | ICD-10-CM

## 2015-01-08 DIAGNOSIS — M4806 Spinal stenosis, lumbar region: Secondary | ICD-10-CM

## 2015-01-08 DIAGNOSIS — N138 Other obstructive and reflux uropathy: Secondary | ICD-10-CM

## 2015-01-08 DIAGNOSIS — N401 Enlarged prostate with lower urinary tract symptoms: Secondary | ICD-10-CM

## 2015-01-08 DIAGNOSIS — J302 Other seasonal allergic rhinitis: Secondary | ICD-10-CM | POA: Diagnosis not present

## 2015-01-08 DIAGNOSIS — M48061 Spinal stenosis, lumbar region without neurogenic claudication: Secondary | ICD-10-CM

## 2015-01-08 MED ORDER — CETIRIZINE HCL 10 MG PO TABS: 10.0000 mg | ORAL_TABLET | Freq: Every day | ORAL | Status: DC

## 2015-01-08 MED ORDER — MELOXICAM 15 MG PO TABS: 15.0000 mg | ORAL_TABLET | Freq: Every day | ORAL | Status: DC

## 2015-01-08 MED ORDER — HYDROCODONE-ACETAMINOPHEN 10-325 MG PO TABS: 1.0000 | ORAL_TABLET | Freq: Four times a day (QID) | ORAL | Status: DC | PRN

## 2015-01-08 MED ORDER — AZELASTINE-FLUTICASONE 137-50 MCG/ACT NA SUSP: 2.0000 | Freq: Two times a day (BID) | NASAL | Status: DC

## 2015-01-08 MED ORDER — TAMSULOSIN HCL 0.4 MG PO CAPS: 0.4000 mg | ORAL_CAPSULE | Freq: Every day | ORAL | Status: DC

## 2015-01-08 MED ORDER — BETAMETHASONE VALERATE 0.12 % EX FOAM: 1.0000 | Freq: Two times a day (BID) | CUTANEOUS | Status: DC

## 2015-01-08 NOTE — Progress Notes (Signed)
Pre visit review using our clinic review tool, if applicable. No additional management support is needed unless otherwise documented below in the visit note. 

## 2015-01-08 NOTE — Patient Instructions (Signed)
Back Pain, Adult Low back pain is very common. About 1 in 5 people have back pain.The cause of low back pain is rarely dangerous. The pain often gets better over time.About half of people with a sudden onset of back pain feel better in just 2 weeks. About 8 in 10 people feel better by 6 weeks.  CAUSES Some common causes of back pain include:  Strain of the muscles or ligaments supporting the spine.  Wear and tear (degeneration) of the spinal discs.  Arthritis.  Direct injury to the back. DIAGNOSIS Most of the time, the direct cause of low back pain is not known.However, back pain can be treated effectively even when the exact cause of the pain is unknown.Answering your caregiver's questions about your overall health and symptoms is one of the most accurate ways to make sure the cause of your pain is not dangerous. If your caregiver needs more information, he or she may order lab work or imaging tests (X-rays or MRIs).However, even if imaging tests show changes in your back, this usually does not require surgery. HOME CARE INSTRUCTIONS For many people, back pain returns.Since low back pain is rarely dangerous, it is often a condition that people can learn to manageon their own.   Remain active. It is stressful on the back to sit or stand in one place. Do not sit, drive, or stand in one place for more than 30 minutes at a time. Take short walks on level surfaces as soon as pain allows.Try to increase the length of time you walk each day.  Do not stay in bed.Resting more than 1 or 2 days can delay your recovery.  Do not avoid exercise or work.Your body is made to move.It is not dangerous to be active, even though your back may hurt.Your back will likely heal faster if you return to being active before your pain is gone.  Pay attention to your body when you bend and lift. Many people have less discomfortwhen lifting if they bend their knees, keep the load close to their bodies,and  avoid twisting. Often, the most comfortable positions are those that put less stress on your recovering back.  Find a comfortable position to sleep. Use a firm mattress and lie on your side with your knees slightly bent. If you lie on your back, put a pillow under your knees.  Only take over-the-counter or prescription medicines as directed by your caregiver. Over-the-counter medicines to reduce pain and inflammation are often the most helpful.Your caregiver may prescribe muscle relaxant drugs.These medicines help dull your pain so you can more quickly return to your normal activities and healthy exercise.  Put ice on the injured area.  Put ice in a plastic bag.  Place a towel between your skin and the bag.  Leave the ice on for 15-20 minutes, 03-04 times a day for the first 2 to 3 days. After that, ice and heat may be alternated to reduce pain and spasms.  Ask your caregiver about trying back exercises and gentle massage. This may be of some benefit.  Avoid feeling anxious or stressed.Stress increases muscle tension and can worsen back pain.It is important to recognize when you are anxious or stressed and learn ways to manage it.Exercise is a great option. SEEK MEDICAL CARE IF:  You have pain that is not relieved with rest or medicine.  You have pain that does not improve in 1 week.  You have new symptoms.  You are generally not feeling well. SEEK   IMMEDIATE MEDICAL CARE IF:   You have pain that radiates from your back into your legs.  You develop new bowel or bladder control problems.  You have unusual weakness or numbness in your arms or legs.  You develop nausea or vomiting.  You develop abdominal pain.  You feel faint. Document Released: 05/16/2005 Document Revised: 11/15/2011 Document Reviewed: 09/17/2013 Exit[employer] Patient Information 2015 [employer]. This information is not intended to replace advice given to you by your health care provider. Make sure you  discuss any questions you have with your health care provider.  

## 2015-01-09 ENCOUNTER — Telehealth: Payer: Self-pay

## 2015-01-09 NOTE — Telephone Encounter (Signed)
Pa initiated for Luxiq KEY: [id number]

## 2015-01-11 ENCOUNTER — Encounter: Payer: Self-pay | Admitting: Internal Medicine

## 2015-01-11 NOTE — Progress Notes (Signed)
Subjective:  Patient ID: Jonathan [name], male    DOB: October 29, 1955  Age: 59 y.o. MRN: [id number]  CC: Back Pain and Osteoarthritis   HPI Jonathan [name] presents for follow-up on chronic hip and back pain as well as worsening nasal allergies. He complains of persistent pain in his back and his left hip. He has AVN in both hips. He has had a total hip replacement on the right and is due for one on the left. For now he is taking Norco and it provides him with adequate pain relief. He also complains of mild persistent anxiety. His main complaint today is worsening nasal allergies with congestion, sneezing, and postnasal drip.  Outpatient Prescriptions Prior to Visit  Medication Sig Dispense Refill  . ALPRAZolam (XANAX) 1 MG tablet Take 1 tablet (1 mg total) by mouth 3 (three) times daily as needed for anxiety. 90 tablet 3  . Betamethasone Valerate (LUXIQ) 0.12 % foam Apply 1 Act topically 2 (two) times daily. 100 g 2  . cetirizine (ZYRTEC) 10 MG tablet Take 1 tablet (10 mg total) by mouth daily. 30 tablet 11  . HYDROcodone-acetaminophen (NORCO) 10-325 MG per tablet Take 1 tablet by mouth every 6 (six) hours as needed. 90 tablet 0  . meloxicam (MOBIC) 15 MG tablet Take 1 tablet (15 mg total) by mouth daily. 30 tablet 3  . tamsulosin (FLOMAX) 0.4 MG CAPS capsule Take 1 capsule (0.4 mg total) by mouth daily. 30 capsule 11  . aspirin EC 325 MG EC tablet Take 1 tablet (325 mg total) by mouth 2 (two) times daily after a meal. (Patient not taking: Reported on 11/03/2014) 60 tablet 0   No facility-administered medications prior to visit.    ROS Review of Systems  Constitutional: Negative.  Negative for fever, chills, diaphoresis, appetite change and fatigue.  HENT: Positive for congestion, postnasal drip, rhinorrhea and sneezing. Negative for ear discharge, ear pain, facial swelling, hearing loss, nosebleeds, sinus pressure, sore throat, tinnitus, trouble swallowing and voice change.   Eyes:  Negative.   Respiratory: Negative.  Negative for cough, choking, chest tightness, shortness of breath and stridor.   Cardiovascular: Negative.  Negative for chest pain, palpitations and leg swelling.  Gastrointestinal: Negative.  Negative for nausea, vomiting, abdominal pain, diarrhea, constipation and blood in stool.  Endocrine: Negative.   Genitourinary: Negative.   Musculoskeletal: Positive for back pain and arthralgias. Negative for myalgias, joint swelling, gait problem, neck pain and neck stiffness.  Skin: Negative.  Negative for rash.  Allergic/Immunologic: Negative.   Neurological: Negative.  Negative for dizziness, weakness, light-headedness and numbness.  Hematological: Negative.   Psychiatric/Behavioral: Negative.     Objective:  BP 142/80 mmHg  Pulse 76  Temp(Src) 97.8 F (36.6 C) (Oral)  Resp 16  Ht 6' (1.829 m)  Wt 167 lb (75.751 kg)  BMI 22.64 kg/m2  SpO2 98%  BP Readings from Last 3 Encounters:  01/08/15 142/80  11/03/14 110/66  10/13/14 108/66    Wt Readings from Last 3 Encounters:  01/08/15 167 lb (75.751 kg)  11/03/14 162 lb 0.2 oz (73.488 kg)  10/13/14 164 lb (74.39 kg)    Physical Exam  Constitutional: He is oriented to person, place, and time. He appears well-developed and well-nourished.  Non-toxic appearance. He does not have a sickly appearance. He does not appear ill. No distress.  HENT:  Right Ear: Hearing, tympanic membrane, external ear and ear canal normal.  Left Ear: Hearing, tympanic membrane, external ear and ear canal normal.  Nose: Mucosal edema present. No rhinorrhea, sinus tenderness, nasal deformity or septal deviation. No epistaxis. Right sinus exhibits no maxillary sinus tenderness and no frontal sinus tenderness. Left sinus exhibits no maxillary sinus tenderness and no frontal sinus tenderness.  Mouth/Throat: Oropharynx is clear and moist and mucous membranes are normal. Mucous membranes are not pale, not dry and not cyanotic. No  oral lesions. No trismus in the jaw. No uvula swelling. No oropharyngeal exudate, posterior oropharyngeal edema, posterior oropharyngeal erythema or tonsillar abscesses.  Eyes: Conjunctivae are normal. Right eye exhibits no discharge. Left eye exhibits no discharge. No scleral icterus.  Neck: Normal range of motion. Neck supple. No JVD present. No tracheal deviation present. No thyromegaly present.  Cardiovascular: Normal rate, regular rhythm, normal heart sounds and intact distal pulses.  Exam reveals no gallop and no friction rub.   No murmur heard. Pulmonary/Chest: Effort normal and breath sounds normal. No stridor. No respiratory distress. He has no wheezes. He has no rales. He exhibits no tenderness.  Abdominal: Soft. Bowel sounds are normal. He exhibits no distension and no mass. There is no tenderness. There is no rebound and no guarding.  Musculoskeletal: Normal range of motion. He exhibits no edema or tenderness.  Lymphadenopathy:    He has no cervical adenopathy.  Neurological: He is oriented to person, place, and time.  Skin: Skin is warm and dry. No rash noted. He is not diaphoretic. No erythema. No pallor.  Psychiatric: He has a normal mood and affect. His behavior is normal. Judgment and thought content normal.  Vitals reviewed.   Lab Results  Component Value Date   WBC 15.4* 09/19/2014   HGB 13.5 09/19/2014   HCT 39.1 09/19/2014   PLT 254 09/19/2014   GLUCOSE 95 10/13/2014   CHOL 182 10/13/2014   TRIG 53.0 10/13/2014   HDL 59.60 10/13/2014   LDLDIRECT 131.6 07/14/2011   LDLCALC 112* 10/13/2014   ALT 12 09/08/2014   AST 26 09/08/2014   NA 136 10/13/2014   K 3.9 10/13/2014   CL 103 10/13/2014   CREATININE 0.83 10/13/2014   BUN 9 10/13/2014   CO2 29 10/13/2014   TSH 0.65 07/14/2011   PSA 1.57 09/07/2012   INR 0.97 09/08/2014   HGBA1C 5.2 10/13/2014    No results found.  Assessment & Plan:   Jonathan [name] was seen today for back pain and osteoarthritis.  Diagnoses  and all orders for this visit:  Spinal stenosis of lumbar region -     Discontinue: HYDROcodone-acetaminophen (NORCO) 10-325 MG per tablet; Take 1 tablet by mouth every 6 (six) hours as needed. -     meloxicam (MOBIC) 15 MG tablet; Take 1 tablet (15 mg total) by mouth daily. -     Discontinue: HYDROcodone-acetaminophen (NORCO) 10-325 MG per tablet; Take 1 tablet by mouth every 6 (six) hours as needed. -     HYDROcodone-acetaminophen (NORCO) 10-325 MG per tablet; Take 1 tablet by mouth every 6 (six) hours as needed.  Other seasonal allergic rhinitis -     cetirizine (ZYRTEC) 10 MG tablet; Take 1 tablet (10 mg total) by mouth daily. -     Azelastine-Fluticasone (DYMISTA) 137-50 MCG/ACT SUSP; Place 2 Act into the nose 2 (two) times daily.  Dermatitis, contact -     Betamethasone Valerate (LUXIQ) 0.12 % foam; Apply 1 Act topically 2 (two) times daily.  BPH (benign prostatic hypertrophy) with urinary obstruction -     tamsulosin (FLOMAX) 0.4 MG CAPS capsule; Take 1 capsule (0.4 mg total)  by mouth daily.   I have discontinued Mr. Pinn aspirin. I am also having him start on Azelastine-Fluticasone. Additionally, I am having him maintain his cetirizine, Betamethasone Valerate, meloxicam, tamsulosin, and HYDROcodone-acetaminophen.  Meds ordered this encounter  Medications  . DISCONTD: HYDROcodone-acetaminophen (NORCO) 10-325 MG per tablet    Sig: Take 1 tablet by mouth every 6 (six) hours as needed.    Dispense:  90 tablet    Refill:  0    Fill on or after 01/08/15  . cetirizine (ZYRTEC) 10 MG tablet    Sig: Take 1 tablet (10 mg total) by mouth daily.    Dispense:  30 tablet    Refill:  11  . Betamethasone Valerate (LUXIQ) 0.12 % foam    Sig: Apply 1 Act topically 2 (two) times daily.    Dispense:  100 g    Refill:  2  . meloxicam (MOBIC) 15 MG tablet    Sig: Take 1 tablet (15 mg total) by mouth daily.    Dispense:  30 tablet    Refill:  3  . tamsulosin (FLOMAX) 0.4 MG CAPS capsule     Sig: Take 1 capsule (0.4 mg total) by mouth daily.    Dispense:  30 capsule    Refill:  11  . Azelastine-Fluticasone (DYMISTA) 137-50 MCG/ACT SUSP    Sig: Place 2 Act into the nose 2 (two) times daily.    Dispense:  23 g    Refill:  11  . DISCONTD: HYDROcodone-acetaminophen (NORCO) 10-325 MG per tablet    Sig: Take 1 tablet by mouth every 6 (six) hours as needed.    Dispense:  90 tablet    Refill:  0    Fill on or after 02/08/15  . HYDROcodone-acetaminophen (NORCO) 10-325 MG per tablet    Sig: Take 1 tablet by mouth every 6 (six) hours as needed.    Dispense:  90 tablet    Refill:  0    Fill on or after 03/10/15     Follow-up: Return in about 3 months (around 04/10/2015).  Scarlette Calico, MD

## 2015-02-19 ENCOUNTER — Other Ambulatory Visit (INDEPENDENT_AMBULATORY_CARE_PROVIDER_SITE_OTHER): Payer: Medicare Other

## 2015-02-19 ENCOUNTER — Ambulatory Visit (INDEPENDENT_AMBULATORY_CARE_PROVIDER_SITE_OTHER): Payer: Medicare Other | Admitting: Internal Medicine

## 2015-02-19 ENCOUNTER — Encounter: Payer: Self-pay | Admitting: Internal Medicine

## 2015-02-19 VITALS — BP 132/86 | HR 49 | Temp 98.0°F | Resp 16 | Wt 168.0 lb

## 2015-02-19 DIAGNOSIS — K146 Glossodynia: Secondary | ICD-10-CM

## 2015-02-19 DIAGNOSIS — J309 Allergic rhinitis, unspecified: Secondary | ICD-10-CM

## 2015-02-19 LAB — VITAMIN B12: Vitamin B-12: 344 pg/mL (ref 211–911)

## 2015-02-19 NOTE — Patient Instructions (Signed)
Plain Mucinex (NOT D) for thick secretions ;force NON dairy fluids .   Nasal cleansing in the shower as discussed with lather of mild shampoo.After 10 seconds wash off lather while  exhaling through nostrils. Make sure that all residual soap is removed to prevent irritation. Use a Neti pot daily to twice daily as needed for significant nasal congestion. Use the nasal spray 1-2 times a day as needed employing  a crossover technique as discussed if  nasal congestion is present. Breathe Easy Strips may also help snoring. If apnea is observed; sleep specialty evaluation as needed.   Plain Allegra (NOT D )  160 daily , Loratidine 10 mg , OR Zyrtec 10 mg @ bedtime  as needed for itchy eyes & sneezing.

## 2015-02-19 NOTE — Progress Notes (Signed)
   Subjective:    Patient ID: Jonathan [name], male    DOB: 1955-08-16, 59 y.o.   MRN: [id number]  HPI He has had burning of his tongue since 02/07/15. He questions whether this was related to his nasal spray which includes fluticasone. By history he had itching with cortisone injections. Also the day before symptoms began he ate peanuts.  The burning and stinging of the tongue persists along with soreness in the roof of his mouth. He questions possible swelling of his tongue.  He is not on ACE inhibitor. He has no history of B12 deficiency. His most recent CBC and differential revealed no anemia.  Review of Systems  No associated itchy, watery eyes.  No intraoral lesions .  Shortness of breath, wheezing, or cough absent.  No vesicles, pustules or urticaria noted.  Fever ,chills , or sweats denied.   Diarrhea not present.  No dysuria, pyuria or hematuria.     Objective:   Physical Exam  Pertinent or positive findings include: There is a resting exotropia of the right eye. The right eye is blind. He has arcus senilis bilaterally. There is mild erythema of the nasal mucosa. He has wax in the right otic canal. The tongue appears normal without swelling or color change.  General appearance :adequately nourished; in no distress.  Eyes: No conjunctival inflammation or scleral icterus is present.  Oral exam:  Lips and gums are healthy appearing.There is no oropharyngeal erythema or exudate noted. Dental hygiene is good.  Heart:  Normal rate and regular rhythm. S1 and S2 normal without gallop, murmur, click, rub or other extra sounds    Lungs:Chest clear to auscultation; no wheezes, rhonchi,rales ,or rubs present.No increased work of breathing.   Abdomen: bowel sounds normal, soft and non-tender without masses, organomegaly or hernias noted.  No guarding or rebound. No flank tenderness to percussion.  Vascular : all pulses equal ; no bruits present.  Skin:Warm & dry.  Intact  without suspicious lesions or rashes ; no tenting    Lymphatic: No lymphadenopathy is noted about the head, neck, axilla.   Neuro: Strength, tone & DTRs normal.      Assessment & Plan:  #1 burning tongue, possible reaction to peanuts  #2 History of cortisone allergy  See orders and recommendations

## 2015-02-19 NOTE — Progress Notes (Signed)
Pre visit review using our clinic review tool, if applicable. No additional management support is needed unless otherwise documented below in the visit note. 

## 2015-04-06 ENCOUNTER — Encounter: Payer: Self-pay | Admitting: Internal Medicine

## 2015-04-06 ENCOUNTER — Other Ambulatory Visit (INDEPENDENT_AMBULATORY_CARE_PROVIDER_SITE_OTHER): Payer: Medicare Other

## 2015-04-06 ENCOUNTER — Ambulatory Visit (INDEPENDENT_AMBULATORY_CARE_PROVIDER_SITE_OTHER): Payer: Medicare Other | Admitting: Internal Medicine

## 2015-04-06 VITALS — BP 148/82 | HR 81 | Temp 98.0°F | Resp 16 | Ht 72.0 in | Wt 179.0 lb

## 2015-04-06 DIAGNOSIS — N138 Other obstructive and reflux uropathy: Secondary | ICD-10-CM

## 2015-04-06 DIAGNOSIS — R739 Hyperglycemia, unspecified: Secondary | ICD-10-CM | POA: Diagnosis not present

## 2015-04-06 DIAGNOSIS — Z202 Contact with and (suspected) exposure to infections with a predominantly sexual mode of transmission: Secondary | ICD-10-CM

## 2015-04-06 DIAGNOSIS — N401 Enlarged prostate with lower urinary tract symptoms: Secondary | ICD-10-CM | POA: Diagnosis not present

## 2015-04-06 DIAGNOSIS — M87051 Idiopathic aseptic necrosis of right femur: Secondary | ICD-10-CM

## 2015-04-06 DIAGNOSIS — F411 Generalized anxiety disorder: Secondary | ICD-10-CM

## 2015-04-06 DIAGNOSIS — M87052 Idiopathic aseptic necrosis of left femur: Secondary | ICD-10-CM

## 2015-04-06 DIAGNOSIS — M4806 Spinal stenosis, lumbar region: Secondary | ICD-10-CM

## 2015-04-06 DIAGNOSIS — M48061 Spinal stenosis, lumbar region without neurogenic claudication: Secondary | ICD-10-CM

## 2015-04-06 DIAGNOSIS — Z1159 Encounter for screening for other viral diseases: Secondary | ICD-10-CM

## 2015-04-06 LAB — CBC WITH DIFFERENTIAL/PLATELET
Basophils Absolute: 0 10*3/uL (ref 0.0–0.1)
Basophils Relative: 0.8 % (ref 0.0–3.0)
Eosinophils Absolute: 0.1 10*3/uL (ref 0.0–0.7)
Eosinophils Relative: 1.9 % (ref 0.0–5.0)
HCT: 51 % (ref 39.0–52.0)
Hemoglobin: 16.9 g/dL (ref 13.0–17.0)
Lymphocytes Relative: 53.6 % — ABNORMAL HIGH (ref 12.0–46.0)
Lymphs Abs: 2.1 10*3/uL (ref 0.7–4.0)
MCHC: 33.2 g/dL (ref 30.0–36.0)
MCV: 85.9 fl (ref 78.0–100.0)
Monocytes Absolute: 0.4 10*3/uL (ref 0.1–1.0)
Monocytes Relative: 9.6 % (ref 3.0–12.0)
Neutro Abs: 1.3 10*3/uL — ABNORMAL LOW (ref 1.4–7.7)
Neutrophils Relative %: 34.1 % — ABNORMAL LOW (ref 43.0–77.0)
Platelets: 256 10*3/uL (ref 150.0–400.0)
RBC: 5.93 Mil/uL — ABNORMAL HIGH (ref 4.22–5.81)
RDW: 15.1 % (ref 11.5–15.5)
WBC: 3.9 10*3/uL — ABNORMAL LOW (ref 4.0–10.5)

## 2015-04-06 LAB — COMPREHENSIVE METABOLIC PANEL
ALT: 52 U/L (ref 0–53)
AST: 32 U/L (ref 0–37)
Albumin: 4.8 g/dL (ref 3.5–5.2)
Alkaline Phosphatase: 63 U/L (ref 39–117)
BUN: 7 mg/dL (ref 6–23)
CO2: 29 mEq/L (ref 19–32)
Calcium: 10.4 mg/dL (ref 8.4–10.5)
Chloride: 100 mEq/L (ref 96–112)
Creatinine, Ser: 0.95 mg/dL (ref 0.40–1.50)
GFR: 103.44 mL/min (ref >60.00)
Glucose, Bld: 83 mg/dL (ref 70–99)
Potassium: 4.5 mEq/L (ref 3.5–5.1)
Sodium: 137 mEq/L (ref 135–145)
Total Bilirubin: 0.6 mg/dL (ref 0.2–1.2)
Total Protein: 7.5 g/dL (ref 6.0–8.3)

## 2015-04-06 LAB — URINALYSIS, ROUTINE W REFLEX MICROSCOPIC
Bilirubin Urine: NEGATIVE
Hgb urine dipstick: NEGATIVE
Ketones, ur: NEGATIVE
Leukocytes, UA: NEGATIVE
Nitrite: NEGATIVE
RBC / HPF: NONE SEEN (ref 0–?)
Specific Gravity, Urine: <=1.005 — AB (ref 1.000–1.030)
Total Protein, Urine: NEGATIVE
Urine Glucose: NEGATIVE
Urobilinogen, UA: 0.2 (ref 0.0–1.0)
WBC, UA: NONE SEEN (ref 0–?)
pH: 6.5 (ref 5.0–8.0)

## 2015-04-06 LAB — PSA: PSA: 3.19 ng/mL (ref 0.10–4.00)

## 2015-04-06 MED ORDER — HYDROCODONE-ACETAMINOPHEN 10-325 MG PO TABS: 1.0000 | ORAL_TABLET | Freq: Four times a day (QID) | ORAL | Status: DC | PRN

## 2015-04-06 MED ORDER — ALPRAZOLAM 1 MG PO TABS: 1.0000 mg | ORAL_TABLET | Freq: Three times a day (TID) | ORAL | Status: DC | PRN

## 2015-04-06 MED ORDER — MELOXICAM 15 MG PO TABS: 15.0000 mg | ORAL_TABLET | Freq: Every day | ORAL | Status: DC

## 2015-04-06 NOTE — Progress Notes (Signed)
Pre visit review using our clinic review tool, if applicable. No additional management support is needed unless otherwise documented below in the visit note. 

## 2015-04-06 NOTE — Patient Instructions (Signed)
Safe Sex Safe sex is about reducing the risk of giving or getting a sexually transmitted disease (STD). STDs are spread through sexual contact involving the genitals, mouth, or rectum. Some STDs can be cured and others cannot. Safe sex can also prevent unintended pregnancies.  WHAT ARE SOME SAFE SEX PRACTICES?  Limit your sexual activity to only one partner who is having sex with only you.  Talk to your partner about his or her past partners, past STDs, and drug use.  Use a condom every time you have sexual intercourse. This includes vaginal, oral, and anal sexual activity. Both females and males should wear condoms during oral sex. Only use latex or polyurethane condoms and water-based lubricants. Using petroleum-based lubricants or oils to lubricate a condom will weaken the condom and increase the chance that it will break. The condom should be in place from the beginning to the end of sexual activity. Wearing a condom reduces, but does not completely eliminate, your risk of getting or giving an STD. STDs can be spread by contact with infected body fluids and skin.  Get vaccinated for hepatitis B and HPV.  Avoid alcohol and recreational drugs, which can affect your judgment. You may forget to use a condom or participate in high-risk sex.  For females, avoid douching after sexual intercourse. Douching can spread an infection farther into the reproductive tract.  Check your body for signs of sores, blisters, rashes, or unusual discharge. See your health care provider if you notice any of these signs.  Avoid sexual contact if you have symptoms of an infection or are being treated for an STD. If you or your partner has herpes, avoid sexual contact when blisters are present. Use condoms at all other times.  If you are at risk of being infected with HIV, it is recommended that you take a prescription medicine daily to prevent HIV infection. This is called pre-exposure prophylaxis (PrEP). You are  considered at risk if:  You are a man who has sex with other men (MSM).  You are a heterosexual man or woman who is sexually active with more than one partner.  You take drugs by injection.  You are sexually active with a partner who has HIV.  Talk with your health care provider about whether you are at high risk of being infected with HIV. If you choose to begin PrEP, you should first be tested for HIV. You should then be tested every 3 months for as long as you are taking PrEP.  See your health care provider for regular screenings, exams, and tests for other STDs. Before having sex with a new partner, each of you should be screened for STDs and should talk about the results with each other. WHAT ARE THE BENEFITS OF SAFE SEX?   There is less chance of getting or giving an STD.  You can prevent unwanted or unintended pregnancies.  By discussing safe sex concerns with your partner, you may increase feelings of intimacy, comfort, trust, and honesty between the two of you.   This information is not intended to replace advice given to you by your health care provider. Make sure you discuss any questions you have with your health care provider.   Document Released: 06/23/2004 Document Revised: 06/06/2014 Document Reviewed: 11/07/2011 Elsevier Interactive Patient Education [employer].

## 2015-04-06 NOTE — Progress Notes (Signed)
Subjective:  Patient ID: Jonathan [name], male    DOB: 11-15-1955  Age: 59 y.o. MRN: [id number]  CC: Osteoarthritis and Back Pain   HPI Jonathan [name] presents for f/up on multiple medical concerns as well as concerns about exposure to STD's per his new girlfriend.  Outpatient Prescriptions Prior to Visit  Medication Sig Dispense Refill  . Azelastine-Fluticasone (DYMISTA) 137-50 MCG/ACT SUSP Place 2 Act into the nose 2 (two) times daily. 23 g 11  . Betamethasone Valerate (LUXIQ) 0.12 % foam Apply 1 Act topically 2 (two) times daily. 100 g 2  . cetirizine (ZYRTEC) 10 MG tablet Take 1 tablet (10 mg total) by mouth daily. 30 tablet 11  . tamsulosin (FLOMAX) 0.4 MG CAPS capsule Take 1 capsule (0.4 mg total) by mouth daily. 30 capsule 11  . ALPRAZolam (XANAX) 1 MG tablet TAKE ONE TABLET BY MOUTH THREE TIMES DAILY AS NEEDED FOR ANXIETY 90 tablet 3  . HYDROcodone-acetaminophen (NORCO) 10-325 MG per tablet Take 1 tablet by mouth every 6 (six) hours as needed. 90 tablet 0  . meloxicam (MOBIC) 15 MG tablet Take 1 tablet (15 mg total) by mouth daily. 30 tablet 3   No facility-administered medications prior to visit.    ROS Review of Systems  Constitutional: Negative.  Negative for fever, chills, diaphoresis, appetite change and fatigue.  HENT: Negative.   Eyes: Negative.   Respiratory: Negative.  Negative for cough, choking, chest tightness, shortness of breath and stridor.   Cardiovascular: Negative.  Negative for chest pain, palpitations and leg swelling.  Gastrointestinal: Negative.  Negative for vomiting, abdominal pain, diarrhea, constipation and blood in stool.  Endocrine: Negative.   Genitourinary: Negative.  Negative for dysuria, hematuria, decreased urine volume, discharge, penile swelling, scrotal swelling, difficulty urinating, genital sores, penile pain and testicular pain.  Musculoskeletal: Positive for back pain and arthralgias (left hip only).  Skin: Negative.     Allergic/Immunologic: Negative.   Neurological: Negative.   Hematological: Negative.  Negative for adenopathy. Does not bruise/bleed easily.  Psychiatric/Behavioral: Positive for sleep disturbance. Negative for suicidal ideas, behavioral problems, confusion, self-injury, dysphoric mood and decreased concentration. The patient is nervous/anxious.     Objective:  BP 148/82 mmHg  Pulse 81  Temp(Src) 98 F (36.7 C) (Oral)  Resp 16  Ht 6' (1.829 m)  Wt 179 lb (81.194 kg)  BMI 24.27 kg/m2  SpO2 98%  BP Readings from Last 3 Encounters:  04/06/15 148/82  02/19/15 132/86  01/08/15 142/80    Wt Readings from Last 3 Encounters:  04/06/15 179 lb (81.194 kg)  02/19/15 168 lb (76.204 kg)  01/08/15 167 lb (75.751 kg)    Physical Exam  Constitutional: He is oriented to person, place, and time. He appears well-developed and well-nourished. No distress.  HENT:  Head: Normocephalic and atraumatic.  Mouth/Throat: Oropharynx is clear and moist. No oropharyngeal exudate.  Eyes: Conjunctivae are normal. Right eye exhibits no discharge. Left eye exhibits no discharge. No scleral icterus.  Neck: Normal range of motion. Neck supple. No JVD present. No tracheal deviation present. No thyromegaly present.  Cardiovascular: Normal rate, regular rhythm, normal heart sounds and intact distal pulses.  Exam reveals no gallop and no friction rub.   No murmur heard. Pulmonary/Chest: Effort normal and breath sounds normal. No stridor. No respiratory distress. He has no wheezes. He has no rales. He exhibits no tenderness.  Abdominal: Soft. Bowel sounds are normal. He exhibits no distension and no mass. There is no tenderness. There is no  rebound and no guarding.  Musculoskeletal: Normal range of motion. He exhibits no edema or tenderness.  Lymphadenopathy:    He has no cervical adenopathy.  Neurological: He is oriented to person, place, and time.  Skin: Skin is warm and dry. No rash noted. He is not  diaphoretic. No erythema. No pallor.  Psychiatric: He has a normal mood and affect. His behavior is normal. Judgment and thought content normal.  Vitals reviewed.   Lab Results  Component Value Date   WBC 15.4* 09/19/2014   HGB 13.5 09/19/2014   HCT 39.1 09/19/2014   PLT 254 09/19/2014   GLUCOSE 95 10/13/2014   CHOL 182 10/13/2014   TRIG 53.0 10/13/2014   HDL 59.60 10/13/2014   LDLDIRECT 131.6 07/14/2011   LDLCALC 112* 10/13/2014   ALT 12 09/08/2014   AST 26 09/08/2014   NA 136 10/13/2014   K 3.9 10/13/2014   CL 103 10/13/2014   CREATININE 0.83 10/13/2014   BUN 9 10/13/2014   CO2 29 10/13/2014   TSH 0.65 07/14/2011   PSA 1.57 09/07/2012   INR 0.97 09/08/2014   HGBA1C 5.2 10/13/2014    No results found.  Assessment & Plan:   Jonathan [name] was seen today for osteoarthritis and back pain.  Diagnoses and all orders for this visit:  Spinal stenosis of lumbar region- will cont the current meds for pain relief -     meloxicam (MOBIC) 15 MG tablet; Take 1 tablet (15 mg total) by mouth daily. -     Discontinue: HYDROcodone-acetaminophen (NORCO) 10-325 MG tablet; Take 1 tablet by mouth every 6 (six) hours as needed. -     Discontinue: HYDROcodone-acetaminophen (NORCO) 10-325 MG tablet; Take 1 tablet by mouth every 6 (six) hours as needed. -     Discontinue: HYDROcodone-acetaminophen (NORCO) 10-325 MG tablet; Take 1 tablet by mouth every 6 (six) hours as needed. -     HYDROcodone-acetaminophen (NORCO) 10-325 MG tablet; Take 1 tablet by mouth every 6 (six) hours as needed.  Need for hepatitis C screening test -     Hepatitis C Antibody; Future  Avascular necrosis of bones of both hips ([place])- he is not ready to have surgery on the left side yet, will cont the current meds for pain relief -     meloxicam (MOBIC) 15 MG tablet; Take 1 tablet (15 mg total) by mouth daily. -     Discontinue: HYDROcodone-acetaminophen (NORCO) 10-325 MG tablet; Take 1 tablet by mouth every 6 (six) hours as  needed. -     Discontinue: HYDROcodone-acetaminophen (NORCO) 10-325 MG tablet; Take 1 tablet by mouth every 6 (six) hours as needed. -     Discontinue: HYDROcodone-acetaminophen (NORCO) 10-325 MG tablet; Take 1 tablet by mouth every 6 (six) hours as needed. -     HYDROcodone-acetaminophen (NORCO) 10-325 MG tablet; Take 1 tablet by mouth every 6 (six) hours as needed.  BPH with obstruction/lower urinary tract symptoms -     Urinalysis, Routine w reflex microscopic (not at [facility]); Future -     PSA; Future  Hyperglycemia- improvement noted -     Comprehensive metabolic panel; Future  GAD (generalized anxiety disorder) -     ALPRAZolam (XANAX) 1 MG tablet; Take 1 tablet (1 mg total) by mouth 3 (three) times daily as needed. for anxiety  Exposure to STD- though he has no signs or symptoms, at his request I will screen him for "all" STDs. -     Urinalysis, Routine w reflex microscopic (not at  [facility]); Future -     CBC with Differential/Platelet; Future -     Hepatitis A antibody, total; Future -     Hepatitis B core antibody, total; Future -     Hepatitis B surface antibody; Future -     Hepatitis C antibody; Future -     HIV antibody; Future -     RPR; Future -     HSV(herpes smplx)abs-1+2(IgG+IgM)-bld; Future -     GC/chlamydia probe amp, urine; Future  Other orders -     Cancel: meloxicam (MOBIC) 15 MG tablet; Take 1 tablet (15 mg total) by mouth daily. -     Cancel: ALPRAZolam (XANAX) 1 MG tablet; Take 1 tablet (1 mg total) by mouth 3 (three) times daily as needed. for anxiety -     Cancel: HYDROcodone-acetaminophen (NORCO) 10-325 MG tablet; Take 1 tablet by mouth every 6 (six) hours as needed.  I have changed Jonathan [name] ALPRAZolam. I am also having him maintain his cetirizine, Betamethasone Valerate, tamsulosin, Azelastine-Fluticasone, meloxicam, and HYDROcodone-acetaminophen.  Meds ordered this encounter  Medications  . meloxicam (MOBIC) 15 MG tablet    Sig: Take 1 tablet (15 mg  total) by mouth daily.    Dispense:  30 tablet    Refill:  3  . ALPRAZolam (XANAX) 1 MG tablet    Sig: Take 1 tablet (1 mg total) by mouth 3 (three) times daily as needed. for anxiety    Dispense:  90 tablet    Refill:  3  . DISCONTD: HYDROcodone-acetaminophen (NORCO) 10-325 MG tablet    Sig: Take 1 tablet by mouth every 6 (six) hours as needed.    Dispense:  90 tablet    Refill:  0    Fill on or after 04/06/15  . DISCONTD: HYDROcodone-acetaminophen (NORCO) 10-325 MG tablet    Sig: Take 1 tablet by mouth every 6 (six) hours as needed.    Dispense:  90 tablet    Refill:  0    Fill on or after 05/06/15  . DISCONTD: HYDROcodone-acetaminophen (NORCO) 10-325 MG tablet    Sig: Take 1 tablet by mouth every 6 (six) hours as needed.    Dispense:  90 tablet    Refill:  0    Fill on or after 06/06/15  . HYDROcodone-acetaminophen (NORCO) 10-325 MG tablet    Sig: Take 1 tablet by mouth every 6 (six) hours as needed.    Dispense:  90 tablet    Refill:  0    Fill on or after 07/07/15     Follow-up: Return in about 4 months (around 08/04/2015).  Scarlette Calico, MD

## 2015-04-07 LAB — HEPATITIS C ANTIBODY: HCV Ab: NEGATIVE

## 2015-04-07 LAB — GC/CHLAMYDIA PROBE AMP, URINE
Chlamydia, Swab/Urine, PCR: NEGATIVE
GC Probe Amp, Urine: NEGATIVE

## 2015-04-07 LAB — HEPATITIS A ANTIBODY, TOTAL: Hep A Total Ab: NONREACTIVE

## 2015-04-07 LAB — HEPATITIS B CORE ANTIBODY, TOTAL: Hep B Core Total Ab: REACTIVE — AB

## 2015-04-07 LAB — HEPATITIS B SURFACE ANTIBODY,QUALITATIVE: Hep B S Ab: POSITIVE — AB

## 2015-04-07 LAB — RPR

## 2015-04-07 LAB — HIV ANTIBODY (ROUTINE TESTING W REFLEX): HIV 1&2 Ab, 4th Generation: NONREACTIVE

## 2015-04-08 ENCOUNTER — Encounter: Payer: Self-pay | Admitting: Internal Medicine

## 2015-04-08 LAB — HSV(HERPES SMPLX)ABS-I+II(IGG+IGM)-BLD
HSV 1 Glycoprotein G Ab, IgG: 6.47 IV — ABNORMAL HIGH
HSV 2 Glycoprotein G Ab, IgG: 6.66 IV — ABNORMAL HIGH
Herpes Simplex Vrs I&II-IgM Ab (EIA): 0.96 INDEX

## 2015-04-13 ENCOUNTER — Telehealth: Payer: Self-pay | Admitting: Internal Medicine

## 2015-04-13 ENCOUNTER — Emergency Department (HOSPITAL_COMMUNITY)
Admission: EM | Admit: 2015-04-13 | Discharge: 2015-04-13 | Disposition: A | Discharge status: Home / Self Care | Payer: Medicare Other | Attending: Emergency Medicine | Admitting: Emergency Medicine

## 2015-04-13 ENCOUNTER — Encounter (HOSPITAL_COMMUNITY): Payer: Self-pay | Admitting: Family Medicine

## 2015-04-13 DIAGNOSIS — H5441 Blindness, right eye, normal vision left eye: Secondary | ICD-10-CM | POA: Insufficient documentation

## 2015-04-13 DIAGNOSIS — K2100 Gastro-esophageal reflux disease with esophagitis, without bleeding: Secondary | ICD-10-CM

## 2015-04-13 DIAGNOSIS — F419 Anxiety disorder, unspecified: Secondary | ICD-10-CM | POA: Insufficient documentation

## 2015-04-13 DIAGNOSIS — Z791 Long term (current) use of non-steroidal anti-inflammatories (NSAID): Secondary | ICD-10-CM | POA: Insufficient documentation

## 2015-04-13 DIAGNOSIS — J029 Acute pharyngitis, unspecified: Secondary | ICD-10-CM | POA: Diagnosis present

## 2015-04-13 DIAGNOSIS — Z79899 Other long term (current) drug therapy: Secondary | ICD-10-CM | POA: Insufficient documentation

## 2015-04-13 DIAGNOSIS — Z86010 Personal history of colonic polyps: Secondary | ICD-10-CM | POA: Diagnosis not present

## 2015-04-13 DIAGNOSIS — M1990 Unspecified osteoarthritis, unspecified site: Secondary | ICD-10-CM | POA: Insufficient documentation

## 2015-04-13 DIAGNOSIS — Z87891 Personal history of nicotine dependence: Secondary | ICD-10-CM | POA: Diagnosis not present

## 2015-04-13 MED ORDER — SUCRALFATE 1 G PO TABS: 1.0000 g | ORAL_TABLET | Freq: Four times a day (QID) | ORAL | Status: DC

## 2015-04-13 MED ORDER — OMEPRAZOLE 20 MG PO CPDR: 20.0000 mg | DELAYED_RELEASE_CAPSULE | Freq: Two times a day (BID) | ORAL | Status: DC

## 2015-04-13 MED ORDER — TRAMADOL HCL 50 MG PO TABS: 50.0000 mg | ORAL_TABLET | Freq: Four times a day (QID) | ORAL | Status: DC | PRN

## 2015-04-13 NOTE — Discharge Instructions (Signed)
Stop taking Mobic, and ibuprofen. Stop smoking Call ENT, Dr. Erik Obey for a repeat examination.  Esophagitis Esophagitis is inflammation of the esophagus. The esophagus is the tube that carries food and liquids from your mouth to your stomach. Esophagitis can cause soreness or pain in the esophagus. This condition can make it difficult and painful to swallow.  CAUSES Most causes of esophagitis are not serious. Common causes of this condition include:  Gastroesophageal reflux disease (GERD). This is when stomach contents move back up into the esophagus (reflux).  Repeated vomiting.  An allergic-type reaction, especially caused by food allergies (eosinophilic esophagitis).  Injury to the esophagus by swallowing large pills with or without water, or swallowing certain types of medicines.  Swallowing (ingesting) harmful chemicals, such as household cleaning products.  Heavy alcohol use.  An infection of the esophagus.This most often occurs in people who have a weakened immune system.  Radiation or chemotherapy treatment for cancer.  Certain diseases such as sarcoidosis, Crohn disease, and scleroderma. SYMPTOMS Symptoms of this condition include:  Difficult or painful swallowing.  Pain with swallowing acidic liquids, such as citrus juices.  Pain with burping.  Chest pain.  Difficulty breathing.  Nausea.  Vomiting.  Pain in the abdomen.  Weight loss.  Ulcers in the mouth.  Patches of white material in the mouth (candidiasis).  Fever.  Coughing up blood or vomiting blood.  Stool that is black, tarry, or bright red. DIAGNOSIS Your health care provider will take a medical history and perform a physical exam. You may also have other tests, including:  An endoscopy to examine your stomach and esophagus with a small camera.  A test that measures the acidity level in your esophagus.  A test that measures how much pressure is on your esophagus.  A barium swallow or  modified barium swallow to show the shape, size, and functioning of your esophagus.  Allergy tests. TREATMENT Treatment for this condition depends on the cause of your esophagitis. In some cases, steroids or other medicines may be given to help relieve your symptoms or to treat the underlying cause of your condition. You may have to make some lifestyle changes, such as:  Avoiding alcohol.  Quitting smoking.  Changing your diet.  Exercising.  Changing your sleep habits and your sleep environment. HOME CARE INSTRUCTIONS Take these actions to decrease your discomfort and to help avoid complications. Diet  Follow a diet as recommended by your health care provider. This may involve avoiding foods and drinks such as:  Coffee and tea (with or without caffeine).  Drinks that contain alcohol.  Energy drinks and sports drinks.  Carbonated drinks or sodas.  Chocolate and cocoa.  Peppermint and mint flavorings.  Garlic and onions.  Horseradish.  Spicy and acidic foods, including peppers, chili powder, curry powder, vinegar, hot sauces, and barbecue sauce.  Citrus fruit juices and citrus fruits, such as oranges, lemons, and limes.  Tomato-based foods, such as red sauce, chili, salsa, and pizza with red sauce.  Fried and fatty foods, such as donuts, french fries, potato chips, and high-fat dressings.  High-fat meats, such as hot dogs and fatty cuts of red and white meats, such as rib eye steak, sausage, ham, and bacon.  High-fat dairy items, such as whole milk, butter, and cream cheese.  Eat small, frequent meals instead of large meals.  Avoid drinking large amounts of liquid with your meals.  Avoid eating meals during the 2-3 hours before bedtime.  Avoid lying down right after you eat.  Do not exercise right after you eat.  Avoid foods and drinks that seem to make your symptoms worse. General Instructions  Pay attention to any changes in your symptoms.  Take  over-the-counter and prescription medicines only as told by your health care provider. Do not take aspirin, ibuprofen, or other NSAIDs unless your health care provider told you to do so.  If you have trouble taking pills, use a pill splitter to decrease the size of the pill. This will decrease the chance of the pill getting stuck or injuring your esophagus on the way down. Also, drink water after you take a pill.  Do not use any tobacco products, including cigarettes, chewing tobacco, and e-cigarettes. If you need help quitting, ask your health care provider.  Wear loose-fitting clothing. Do not wear anything tight around your waist that causes pressure on your abdomen.  Raise (elevate) the head of your bed about 6 inches (15 cm).  Try to reduce your stress, such as with yoga or meditation. If you need help reducing stress, ask your health care provider.  If you are overweight, reduce your weight to an amount that is healthy for you. Ask your health care provider for guidance about a safe weight loss goal.  Keep all follow-up visits as told by your health care provider. This is important. SEEK MEDICAL CARE IF:  You have new symptoms.  You have unexplained weight loss.  You have difficulty swallowing, or it hurts to swallow.  You have wheezing or a persistent cough.  Your symptoms do not improve with treatment.  You have frequent heartburn for more than two weeks. SEEK IMMEDIATE MEDICAL CARE IF:  You have severe pain in your arms, neck, jaw, teeth, or back.  You feel sweaty, dizzy, or light-headed.  You have chest pain or shortness of breath.  You vomit and your vomit looks like blood or coffee grounds.  Your stool is bloody or black.  You have a fever.  You cannot swallow, drink, or eat.   This information is not intended to replace advice given to you by your health care provider. Make sure you discuss any questions you have with your health care provider.   Document  Released: 06/23/2004 Document Revised: 02/04/2015 Document Reviewed: 09/10/2014 Elsevier Interactive Patient Education 2016 [place].  Gastroesophageal Reflux Disease, Adult Normally, food travels down the esophagus and stays in the stomach to be digested. If a person has gastroesophageal reflux disease (GERD), food and stomach acid move back up into the esophagus. When this happens, the esophagus becomes sore and swollen (inflamed). Over time, GERD can make small holes (ulcers) in the lining of the esophagus. HOME CARE Diet  Follow a diet as told by your doctor. You may need to avoid foods and drinks such as:  Coffee and tea (with or without caffeine).  Drinks that contain alcohol.  Energy drinks and sports drinks.  Carbonated drinks or sodas.  Chocolate and cocoa.  Peppermint and mint flavorings.  Garlic and onions.  Horseradish.  Spicy and acidic foods, such as peppers, chili powder, curry powder, vinegar, hot sauces, and BBQ sauce.  Citrus fruit juices and citrus fruits, such as oranges, lemons, and limes.  Tomato-based foods, such as red sauce, chili, salsa, and pizza with red sauce.  Fried and fatty foods, such as donuts, french fries, potato chips, and high-fat dressings.  High-fat meats, such as hot dogs, rib eye steak, sausage, ham, and bacon.  High-fat dairy items, such as whole milk, butter, and  cream cheese.  Eat small meals often. Avoid eating large meals.  Avoid drinking large amounts of liquid with your meals.  Avoid eating meals during the 2-3 hours before bedtime.  Avoid lying down right after you eat.  Do not exercise right after you eat. General Instructions  Pay attention to any changes in your symptoms.  Take over-the-counter and prescription medicines only as told by your doctor. Do not take aspirin, ibuprofen, or other NSAIDs unless your doctor says it is okay.  Do not use any tobacco products, including cigarettes, chewing tobacco,  and e-cigarettes. If you need help quitting, ask your doctor.  Wear loose clothes. Do not wear anything tight around your waist.  Raise (elevate) the head of your bed about 6 inches (15 cm).  Try to lower your stress. If you need help doing this, ask your doctor.  If you are overweight, lose an amount of weight that is healthy for you. Ask your doctor about a safe weight loss goal.  Keep all follow-up visits as told by your doctor. This is important. GET HELP IF:  You have new symptoms.  You lose weight and you do not know why it is happening.  You have trouble swallowing, or it hurts to swallow.  You have wheezing or a cough that keeps happening.  Your symptoms do not get better with treatment.  You have a hoarse voice. GET HELP RIGHT AWAY IF:  You have pain in your arms, neck, jaw, teeth, or back.  You feel sweaty, dizzy, or light-headed.  You have chest pain or shortness of breath.  You throw up (vomit) and your throw up looks like blood or coffee grounds.  You pass out (faint).  Your poop (stool) is bloody or black.  You cannot swallow, drink, or eat.   This information is not intended to replace advice given to you by your health care provider. Make sure you discuss any questions you have with your health care provider.   Document Released: 11/02/2007 Document Revised: 02/04/2015 Document Reviewed: 09/10/2014 Elsevier Interactive Patient Education [employer].

## 2015-04-13 NOTE — Telephone Encounter (Signed)
Spoke with pt himself. Went over letter from lab visit on 11/7. Requested an OV. Pt on schedule for 11/22

## 2015-04-13 NOTE — ED Provider Notes (Signed)
CSN: [id number]     Arrival date & time 04/13/15  [id number] History   First MD Initiated Contact with Patient 04/13/15 [phone]     Chief Complaint  Patient presents with  . Oral Swelling  . Sore Throat      HPI  Patient presents evaluation of sore throat, and sensitivity to this tongue. He is at the sensitivity of his tongue for several months. He states his primary care physician told him to take B-12. He has been doing so multivitamin. Had been given B-12 injection as well. States he has a "crack" over this tongue remains painful. He states the [employer] edited is exquisitely painful. No swelling of tongue or the throat. He is able to eat and drink although he states it burns going down. He does not have any regurgitation. No coughing or choking. No sensitivity swelling in the posterior pharynx.  He started taking ibuprofen in addition to his daily Mobicox a few weeks ago because his left hip has become painful. He does smoke daily. He previously drank heavy. States he is not for more than a month.  Past Medical History  Diagnosis Date  . Back pain, lumbosacral 2000  . Arthritis   . Anxiety   . Blind right eye   . Colon polyps 08/29/2011    Hyperplastic colon polyps   Past Surgical History  Procedure Laterality Date  . Spine surgery    . Hernia repair      Right inguinal  . Eye surgery  2007    Fractured bones repaired/blind in right eye  . Total hip arthroplasty Right 09/18/2014    Procedure: RIGHT TOTAL HIP ARTHROPLASTY ANTERIOR APPROACH;  Surgeon: Rod Can, MD;  Location: [facility];  Service: Orthopedics;  Laterality: Right;   Family History  Problem Relation Age of Onset  . Stroke Mother   . Arthritis Mother   . Stroke Father   . Hypertension Father   . Cancer Neg Hx   . COPD Neg Hx   . Heart disease Neg Hx   . Kidney disease Neg Hx    Social History  Substance Use Topics  . Smoking status: Former Smoker -- 0.10 packs/day for 30 years    Types: Cigarettes    . Smokeless tobacco: Never Used     Comment: hsa been quit sincie 08/29/2014   . Alcohol Use: No    Review of Systems  Constitutional: Negative for fever, chills, diaphoresis, appetite change and fatigue.  HENT: Positive for sore throat. Negative for mouth sores and trouble swallowing.        Tongue pain  Eyes: Negative for visual disturbance.  Respiratory: Negative for cough, chest tightness, shortness of breath and wheezing.   Cardiovascular: Negative for chest pain.  Gastrointestinal: Negative for nausea, vomiting, abdominal pain, diarrhea and abdominal distention.  Endocrine: Negative for polydipsia, polyphagia and polyuria.  Genitourinary: Negative for dysuria, frequency and hematuria.  Musculoskeletal: Negative for gait problem.  Skin: Negative for color change, pallor and rash.  Neurological: Negative for dizziness, syncope, light-headedness and headaches.  Hematological: Does not bruise/bleed easily.  Psychiatric/Behavioral: Negative for behavioral problems and confusion.      Allergies  Cortisone  Home Medications   Prior to Admission medications   Medication Sig Start Date End Date Taking? Authorizing Provider  ALPRAZolam Duanne Moron) 1 MG tablet Take 1 tablet (1 mg total) by mouth 3 (three) times daily as needed. for anxiety 04/06/15   Janith Lima, MD  Azelastine-Fluticasone [facility]) [phone] MCG/ACT  SUSP Place 2 Act into the nose 2 (two) times daily. 01/08/15   Janith Lima, MD  Betamethasone Valerate (LUXIQ) 0.12 % foam Apply 1 Act topically 2 (two) times daily. 01/08/15   Janith Lima, MD  cetirizine (ZYRTEC) 10 MG tablet Take 1 tablet (10 mg total) by mouth daily. 01/08/15   Janith Lima, MD  HYDROcodone-acetaminophen (NORCO) 10-325 MG tablet Take 1 tablet by mouth every 6 (six) hours as needed. 04/06/15   Janith Lima, MD  meloxicam (MOBIC) 15 MG tablet Take 1 tablet (15 mg total) by mouth daily. 04/06/15   Janith Lima, MD  omeprazole (PRILOSEC) 20 MG capsule  Take 1 capsule (20 mg total) by mouth 2 (two) times daily. 04/13/15   Tanna Furry, MD  sucralfate (CARAFATE) 1 G tablet Take 1 tablet (1 g total) by mouth 4 (four) times daily. 04/13/15   Tanna Furry, MD  tamsulosin (FLOMAX) 0.4 MG CAPS capsule Take 1 capsule (0.4 mg total) by mouth daily. 01/08/15   Janith Lima, MD  traMADol (ULTRAM) 50 MG tablet Take 1 tablet (50 mg total) by mouth every 6 (six) hours as needed. 04/13/15   Tanna Furry, MD   BP 141/86 mmHg  Pulse 54  Temp(Src) 97.5 F (36.4 C) (Oral)  Resp 20  Ht 6\' 1"  (1.854 m)  Wt 170 lb (77.111 kg)  BMI 22.43 kg/m2  SpO2 99% Physical Exam  Constitutional: He is oriented to person, place, and time. He appears well-developed and well-nourished. No distress.  HENT:  Head: Normocephalic.  Mouth/Throat:    Diffuse brown discoloration of the surface of the tongue consistent with his tobacco use. No swelling of the posterior pharynx. No cervical adenopathy. Normal exam floor the mouth without pain, mass, or other gross visual abnormality.  Eyes: Conjunctivae are normal. Pupils are equal, round, and reactive to light. No scleral icterus.  Neck: Normal range of motion. Neck supple. No thyromegaly present.  Cardiovascular: Normal rate and regular rhythm.  Exam reveals no gallop and no friction rub.   No murmur heard. Pulmonary/Chest: Effort normal and breath sounds normal. No respiratory distress. He has no wheezes. He has no rales.  Abdominal: Soft. Bowel sounds are normal. He exhibits no distension. There is no tenderness. There is no rebound.  Musculoskeletal: Normal range of motion.  Neurological: He is alert and oriented to person, place, and time.  Skin: Skin is warm and dry. No rash noted.  Psychiatric: He has a normal mood and affect. His behavior is normal.    ED Course  Procedures (including critical care time) Labs Review Labs Reviewed - No data to display  Imaging Review No results found. I have personally reviewed and  evaluated these images and lab results as part of my medical decision-making.   EKG Interpretation None      MDM   Final diagnoses:  Gastroesophageal reflux disease with esophagitis    Patient has a brown discoloration of his tongue and a central fissure. Tongue does not appear acutely inflamed or asymmetric. No palpable mass within the tongue or perioral soft tissues. Floor the mouth soft. No anterior or posterior cervical adenopathy. Normal appearance pharynx. He is able to drink water and eat here. States that it burns but has no dysphagia.  Distant consistent with esophagitis and have worsened yesterday started taking ibuprofen in addition to his motorbike about 2 weeks ago. Has had the sensitivity of his tongue for several months. Was told by the primary care to  take B-12 for a glossitis.  You have him ENT referral due to the persistence of symptoms. I asked him in no uncertain terms to stop smoking. Given ENT for recheck. Plan GERD precautions with Carafate, Prilosec.    Tanna Furry, MD 04/13/15 0730

## 2015-04-13 NOTE — ED Notes (Signed)
Pt reports he is experiecing throat swelling and sore throat for 2 months. Pt appears in no acute distress. Respirations are even, regular, and unlabored. Able to keep salvia down.

## 2015-04-13 NOTE — Telephone Encounter (Signed)
Pt was in ER today because his tongue was swollen that he couldn't breath. pts wife is requesting a call to [phone] to discuss labs from his last visit regarding his tongue

## 2015-04-17 ENCOUNTER — Telehealth: Payer: Self-pay | Admitting: Internal Medicine

## 2015-04-17 NOTE — Telephone Encounter (Signed)
Left vm for patient to call back to verify address.  If calls back get with Tammy to re mail labs.

## 2015-04-21 ENCOUNTER — Ambulatory Visit (INDEPENDENT_AMBULATORY_CARE_PROVIDER_SITE_OTHER): Payer: Medicare Other | Admitting: Internal Medicine

## 2015-04-21 ENCOUNTER — Encounter: Payer: Self-pay | Admitting: Internal Medicine

## 2015-04-21 VITALS — BP 120/78 | HR 61 | Temp 98.4°F | Resp 16 | Ht 73.0 in | Wt 176.0 lb

## 2015-04-21 DIAGNOSIS — R894 Abnormal immunological findings in specimens from other organs, systems and tissues: Secondary | ICD-10-CM

## 2015-04-21 DIAGNOSIS — M87051 Idiopathic aseptic necrosis of right femur: Secondary | ICD-10-CM | POA: Diagnosis not present

## 2015-04-21 DIAGNOSIS — Z23 Encounter for immunization: Secondary | ICD-10-CM | POA: Diagnosis not present

## 2015-04-21 DIAGNOSIS — M87052 Idiopathic aseptic necrosis of left femur: Secondary | ICD-10-CM | POA: Diagnosis not present

## 2015-04-21 DIAGNOSIS — M4806 Spinal stenosis, lumbar region: Secondary | ICD-10-CM | POA: Diagnosis not present

## 2015-04-21 DIAGNOSIS — M48061 Spinal stenosis, lumbar region without neurogenic claudication: Secondary | ICD-10-CM

## 2015-04-21 MED ORDER — HYDROCODONE-ACETAMINOPHEN 10-325 MG PO TABS: 1.0000 | ORAL_TABLET | Freq: Four times a day (QID) | ORAL | Status: DC | PRN

## 2015-04-21 NOTE — Patient Instructions (Signed)

## 2015-04-21 NOTE — Progress Notes (Signed)
Pre visit review using our clinic review tool, if applicable. No additional management support is needed unless otherwise documented below in the visit note. 

## 2015-04-21 NOTE — Progress Notes (Signed)
Subjective:  Patient ID: Jonathan [name], male    DOB: 1955/07/19  Age: 59 y.o. MRN: [id number]  CC: Osteoarthritis   HPI [place] presents for follow-up on arthritis pain in his hips and low back, to start hepatitis A vaccines, and to discuss his positive titer for HSV 1 and 2. He tells me that he has never had any outbreaks of herpes-like lesions on his mouth or in his genitalia. At this time he does not want to take a daily antiviral to reduce the spread of herpes to a sexual partner. His last prescription for Norco was written for 3 tablets a day but he tells me that that does not control his pain. He has severe hip and low back pain from degenerative degenerative joint disease and avascular necrosis and has a hard time moving and walking during the day if the pain is not well controlled. He is requesting an increase to 4 times a day.  Outpatient Prescriptions Prior to Visit  Medication Sig Dispense Refill  . ALPRAZolam (XANAX) 1 MG tablet Take 1 tablet (1 mg total) by mouth 3 (three) times daily as needed. for anxiety 90 tablet 3  . Azelastine-Fluticasone (DYMISTA) 137-50 MCG/ACT SUSP Place 2 Act into the nose 2 (two) times daily. 23 g 11  . Betamethasone Valerate (LUXIQ) 0.12 % foam Apply 1 Act topically 2 (two) times daily. 100 g 2  . cetirizine (ZYRTEC) 10 MG tablet Take 1 tablet (10 mg total) by mouth daily. 30 tablet 11  . meloxicam (MOBIC) 15 MG tablet Take 1 tablet (15 mg total) by mouth daily. 30 tablet 3  . omeprazole (PRILOSEC) 20 MG capsule Take 1 capsule (20 mg total) by mouth 2 (two) times daily. 60 capsule 0  . sucralfate (CARAFATE) 1 G tablet Take 1 tablet (1 g total) by mouth 4 (four) times daily. 60 tablet 0  . tamsulosin (FLOMAX) 0.4 MG CAPS capsule Take 1 capsule (0.4 mg total) by mouth daily. 30 capsule 11  . traMADol (ULTRAM) 50 MG tablet Take 1 tablet (50 mg total) by mouth every 6 (six) hours as needed. 15 tablet 0  . HYDROcodone-acetaminophen (NORCO)  10-325 MG tablet Take 1 tablet by mouth every 6 (six) hours as needed. 90 tablet 0   No facility-administered medications prior to visit.    ROS Review of Systems  Constitutional: Negative.  Negative for fever, chills, diaphoresis, appetite change and fatigue.  HENT: Negative.   Eyes: Negative.   Respiratory: Negative.  Negative for cough, choking, chest tightness, shortness of breath and stridor.   Cardiovascular: Negative.  Negative for chest pain, palpitations and leg swelling.  Gastrointestinal: Negative.  Negative for nausea, vomiting, abdominal pain, diarrhea, constipation and blood in stool.  Endocrine: Negative.   Genitourinary: Negative.  Negative for difficulty urinating.  Musculoskeletal: Positive for back pain and arthralgias. Negative for myalgias, joint swelling and neck pain.  Skin: Negative.   Allergic/Immunologic: Negative.   Neurological: Negative.  Negative for dizziness, light-headedness and headaches.  Hematological: Negative.  Negative for adenopathy. Does not bruise/bleed easily.  Psychiatric/Behavioral: Negative.     Objective:  BP 120/78 mmHg  Pulse 61  Temp(Src) 98.4 F (36.9 C) (Oral)  Resp 16  Ht 6\' 1"  (1.854 m)  Wt 176 lb (79.833 kg)  BMI 23.23 kg/m2  SpO2 99%  BP Readings from Last 3 Encounters:  04/21/15 120/78  04/13/15 141/86  04/06/15 148/82    Wt Readings from Last 3 Encounters:  04/21/15 176 lb (  79.833 kg)  04/13/15 170 lb (77.111 kg)  04/06/15 179 lb (81.194 kg)    Physical Exam  Constitutional: He is oriented to person, place, and time. No distress.  HENT:  Mouth/Throat: Oropharynx is clear and moist. No oropharyngeal exudate.  Eyes: Conjunctivae are normal. Right eye exhibits no discharge. Left eye exhibits no discharge. No scleral icterus.  Neck: Normal range of motion. Neck supple. No JVD present. No tracheal deviation present. No thyromegaly present.  Cardiovascular: Normal rate, regular rhythm, normal heart sounds and  intact distal pulses.  Exam reveals no gallop and no friction rub.   No murmur heard. Pulmonary/Chest: Effort normal and breath sounds normal. No stridor. No respiratory distress. He has no wheezes. He has no rales. He exhibits no tenderness.  Abdominal: Soft. Bowel sounds are normal. He exhibits no distension and no mass. There is no tenderness. There is no rebound and no guarding.  Musculoskeletal: Normal range of motion. He exhibits no edema or tenderness.  Lymphadenopathy:    He has no cervical adenopathy.  Neurological: He is oriented to person, place, and time.  Skin: Skin is warm and dry. No rash noted. He is not diaphoretic. No erythema. No pallor.  Vitals reviewed.   Lab Results  Component Value Date   WBC 3.9* 04/06/2015   HGB 16.9 04/06/2015   HCT 51.0 04/06/2015   PLT 256.0 04/06/2015   GLUCOSE 83 04/06/2015   CHOL 182 10/13/2014   TRIG 53.0 10/13/2014   HDL 59.60 10/13/2014   LDLDIRECT 131.6 07/14/2011   LDLCALC 112* 10/13/2014   ALT 52 04/06/2015   AST 32 04/06/2015   NA 137 04/06/2015   K 4.5 04/06/2015   CL 100 04/06/2015   CREATININE 0.95 04/06/2015   BUN 7 04/06/2015   CO2 29 04/06/2015   TSH 0.65 07/14/2011   PSA 3.19 04/06/2015   INR 0.97 09/08/2014   HGBA1C 5.2 10/13/2014    No results found.  Assessment & Plan:   Davari was seen today for osteoarthritis.  Diagnoses and all orders for this visit:  Need for prophylactic vaccination and inoculation against viral hepatitis -     Hepatitis A vaccine adult IM  Avascular necrosis of bones of both hips ([place])- dose adjusted to 4 times a day at his request -     Discontinue: HYDROcodone-acetaminophen (NORCO) 10-325 MG tablet; Take 1 tablet by mouth every 6 (six) hours as needed. -     Discontinue: HYDROcodone-acetaminophen (NORCO) 10-325 MG tablet; Take 1 tablet by mouth every 6 (six) hours as needed. -     HYDROcodone-acetaminophen (NORCO) 10-325 MG tablet; Take 1 tablet by mouth every 6 (six) hours  as needed.  Spinal stenosis of lumbar region- dose adjusted to 4 times a day at his request -     Discontinue: HYDROcodone-acetaminophen (NORCO) 10-325 MG tablet; Take 1 tablet by mouth every 6 (six) hours as needed. -     Discontinue: HYDROcodone-acetaminophen (NORCO) 10-325 MG tablet; Take 1 tablet by mouth every 6 (six) hours as needed. -     HYDROcodone-acetaminophen (NORCO) 10-325 MG tablet; Take 1 tablet by mouth every 6 (six) hours as needed.  Positive test for herpes simplex virus (HSV) antibody- he does not have any symptoms of outbreaks, he does not want to take a daily antiviral to reduce transmission or to prevent outbreaks, he will let me know if he develops any symptoms consistent with herpes infection in his mouth or genitalia.   I am having Mr. Goldmann maintain his  cetirizine, Betamethasone Valerate, tamsulosin, Azelastine-Fluticasone, meloxicam, ALPRAZolam, omeprazole, sucralfate, traMADol, and HYDROcodone-acetaminophen.  Meds ordered this encounter  Medications  . DISCONTD: HYDROcodone-acetaminophen (NORCO) 10-325 MG tablet    Sig: Take 1 tablet by mouth every 6 (six) hours as needed.    Dispense:  120 tablet    Refill:  0    Fill on or after 11/22//16  . DISCONTD: HYDROcodone-acetaminophen (NORCO) 10-325 MG tablet    Sig: Take 1 tablet by mouth every 6 (six) hours as needed.    Dispense:  120 tablet    Refill:  0    Fill on or after 05/21/15  . HYDROcodone-acetaminophen (NORCO) 10-325 MG tablet    Sig: Take 1 tablet by mouth every 6 (six) hours as needed.    Dispense:  120 tablet    Refill:  0    Fill on or after 06/21/15     Follow-up: Return in about 3 months (around 07/22/2015).  Scarlette Calico, MD

## 2015-04-22 DIAGNOSIS — R894 Abnormal immunological findings in specimens from other organs, systems and tissues: Secondary | ICD-10-CM | POA: Insufficient documentation

## 2015-05-01 ENCOUNTER — Telehealth: Payer: Self-pay

## 2015-05-01 NOTE — Telephone Encounter (Signed)
Ayana from [facility] called and wanted to know the DX code for the gon/chlamydia testing done on the DOS: 2015/04/07  DX codes given as requested.

## 2015-07-23 ENCOUNTER — Ambulatory Visit (INDEPENDENT_AMBULATORY_CARE_PROVIDER_SITE_OTHER): Payer: Medicare Other | Admitting: Internal Medicine

## 2015-07-23 ENCOUNTER — Encounter: Payer: Self-pay | Admitting: Internal Medicine

## 2015-07-23 VITALS — BP 116/80 | HR 58 | Temp 98.0°F | Resp 16 | Ht 73.0 in | Wt 185.0 lb

## 2015-07-23 DIAGNOSIS — M87051 Idiopathic aseptic necrosis of right femur: Secondary | ICD-10-CM | POA: Diagnosis not present

## 2015-07-23 DIAGNOSIS — N401 Enlarged prostate with lower urinary tract symptoms: Secondary | ICD-10-CM

## 2015-07-23 DIAGNOSIS — F411 Generalized anxiety disorder: Secondary | ICD-10-CM

## 2015-07-23 DIAGNOSIS — M4806 Spinal stenosis, lumbar region: Secondary | ICD-10-CM

## 2015-07-23 DIAGNOSIS — K2100 Gastro-esophageal reflux disease with esophagitis, without bleeding: Secondary | ICD-10-CM | POA: Insufficient documentation

## 2015-07-23 DIAGNOSIS — M87052 Idiopathic aseptic necrosis of left femur: Secondary | ICD-10-CM

## 2015-07-23 DIAGNOSIS — M48061 Spinal stenosis, lumbar region without neurogenic claudication: Secondary | ICD-10-CM

## 2015-07-23 DIAGNOSIS — N138 Other obstructive and reflux uropathy: Secondary | ICD-10-CM

## 2015-07-23 MED ORDER — HYDROCODONE-ACETAMINOPHEN 10-325 MG PO TABS: 1.0000 | ORAL_TABLET | Freq: Four times a day (QID) | ORAL | Status: DC | PRN

## 2015-07-23 MED ORDER — DEXLANSOPRAZOLE 60 MG PO CPDR: 60.0000 mg | DELAYED_RELEASE_CAPSULE | Freq: Every day | ORAL | Status: DC

## 2015-07-23 MED ORDER — MELOXICAM 15 MG PO TABS: 15.0000 mg | ORAL_TABLET | Freq: Every day | ORAL | Status: DC

## 2015-07-23 MED ORDER — TAMSULOSIN HCL 0.4 MG PO CAPS: 0.4000 mg | ORAL_CAPSULE | Freq: Every day | ORAL | Status: DC

## 2015-07-23 MED ORDER — ALPRAZOLAM 1 MG PO TABS: 1.0000 mg | ORAL_TABLET | Freq: Three times a day (TID) | ORAL | Status: DC | PRN

## 2015-07-23 NOTE — Patient Instructions (Signed)

## 2015-07-23 NOTE — Progress Notes (Signed)
Pre visit review using our clinic review tool, if applicable. No additional management support is needed unless otherwise documented below in the visit note. 

## 2015-07-23 NOTE — Progress Notes (Signed)
Subjective:  Patient ID: Jonathan [name], male    DOB: [birth year]  Age: 60 y.o. MRN: [id number]  CC: Gastroesophageal Reflux; Osteoarthritis; and Back Pain   HPI Jonathan [name] presents for a refill on medications for chronic pain and anxiety. He tells me that his low back and hip pain has not changed much and he is doing well with the combination of hydrocodone and acetaminophen. He has not been taking Mobic very frequently but says when he does take it its effective at helping relieving the pain.  He also complains of heartburn and feeling at times that there is a harsh brash acid taste in the back of his throat. He denies odynophagia, dysphagia, nausea, vomiting, hematemesis or weight loss. In fact he complains of weight gain.  Outpatient Prescriptions Prior to Visit  Medication Sig Dispense Refill  . Azelastine-Fluticasone (DYMISTA) 137-50 MCG/ACT SUSP Place 2 Act into the nose 2 (two) times daily. 23 g 11  . Betamethasone Valerate (LUXIQ) 0.12 % foam Apply 1 Act topically 2 (two) times daily. 100 g 2  . cetirizine (ZYRTEC) 10 MG tablet Take 1 tablet (10 mg total) by mouth daily. 30 tablet 11  . ALPRAZolam (XANAX) 1 MG tablet Take 1 tablet (1 mg total) by mouth 3 (three) times daily as needed. for anxiety 90 tablet 3  . HYDROcodone-acetaminophen (NORCO) 10-325 MG tablet Take 1 tablet by mouth every 6 (six) hours as needed. 120 tablet 0  . meloxicam (MOBIC) 15 MG tablet Take 1 tablet (15 mg total) by mouth daily. 30 tablet 3  . omeprazole (PRILOSEC) 20 MG capsule Take 1 capsule (20 mg total) by mouth 2 (two) times daily. 60 capsule 0  . sucralfate (CARAFATE) 1 G tablet Take 1 tablet (1 g total) by mouth 4 (four) times daily. 60 tablet 0  . tamsulosin (FLOMAX) 0.4 MG CAPS capsule Take 1 capsule (0.4 mg total) by mouth daily. 30 capsule 11  . traMADol (ULTRAM) 50 MG tablet Take 1 tablet (50 mg total) by mouth every 6 (six) hours as needed. 15 tablet 0   No facility-administered  medications prior to visit.    ROS Review of Systems  Constitutional: Negative.  Negative for fever, chills, diaphoresis, appetite change and fatigue.  HENT: Negative.  Negative for congestion, postnasal drip, sinus pressure, sore throat, tinnitus, trouble swallowing and voice change.   Eyes: Negative.   Respiratory: Negative.  Negative for cough, choking, chest tightness, shortness of breath and stridor.   Cardiovascular: Negative.  Negative for chest pain, palpitations and leg swelling.  Gastrointestinal: Negative.  Negative for nausea, vomiting, abdominal pain, diarrhea, constipation and blood in stool.  Endocrine: Negative.   Genitourinary: Negative.  Negative for difficulty urinating.  Musculoskeletal: Positive for back pain and arthralgias. Negative for myalgias and joint swelling.  Skin: Negative.   Allergic/Immunologic: Negative.   Neurological: Negative.  Negative for dizziness, tremors, syncope and numbness.  Hematological: Negative.  Negative for adenopathy. Does not bruise/bleed easily.  Psychiatric/Behavioral: Negative for sleep disturbance, dysphoric mood and decreased concentration. The patient is nervous/anxious.     Objective:  BP 116/80 mmHg  Pulse 58  Temp(Src) 98 F (36.7 C) (Oral)  Resp 16  Ht 6\' 1"  (1.854 m)  Wt 185 lb (83.915 kg)  BMI 24.41 kg/m2  SpO2 97%  BP Readings from Last 3 Encounters:  07/23/15 116/80  04/21/15 120/78  04/13/15 141/86    Wt Readings from Last 3 Encounters:  07/23/15 185 lb (83.915 kg)  04/21/15 176  lb (79.833 kg)  04/13/15 170 lb (77.111 kg)    Physical Exam  Constitutional: He is oriented to person, place, and time. He appears well-developed and well-nourished. No distress.  HENT:  Mouth/Throat: Oropharynx is clear and moist. No oropharyngeal exudate.  Eyes: Conjunctivae are normal. Right eye exhibits no discharge. Left eye exhibits no discharge. No scleral icterus.  Neck: Normal range of motion. Neck supple. No JVD  present. No tracheal deviation present. No thyromegaly present.  Cardiovascular: Normal rate, regular rhythm, normal heart sounds and intact distal pulses.  Exam reveals no gallop and no friction rub.   No murmur heard. Pulmonary/Chest: Effort normal and breath sounds normal. No stridor. No respiratory distress. He has no wheezes. He has no rales. He exhibits no tenderness.  Abdominal: Soft. Bowel sounds are normal. He exhibits no distension and no mass. There is no tenderness. There is no rebound and no guarding.  Musculoskeletal: Normal range of motion. He exhibits no edema or tenderness.       Right hip: Normal.       Left hip: Normal.       Lumbar back: Normal. He exhibits normal range of motion, no tenderness, no bony tenderness, no swelling, no edema, no deformity and no pain.  Lymphadenopathy:    He has no cervical adenopathy.  Neurological: He is oriented to person, place, and time.  Skin: Skin is warm and dry. No rash noted. He is not diaphoretic. No erythema. No pallor.  Vitals reviewed.   Lab Results  Component Value Date   WBC 3.9* 04/06/2015   HGB 16.9 04/06/2015   HCT 51.0 04/06/2015   PLT 256.0 04/06/2015   GLUCOSE 83 04/06/2015   CHOL 182 10/13/2014   TRIG 53.0 10/13/2014   HDL 59.60 10/13/2014   LDLDIRECT 131.6 07/14/2011   LDLCALC 112* 10/13/2014   ALT 52 04/06/2015   AST 32 04/06/2015   NA 137 04/06/2015   K 4.5 04/06/2015   CL 100 04/06/2015   CREATININE 0.95 04/06/2015   BUN 7 04/06/2015   CO2 29 04/06/2015   TSH 0.65 07/14/2011   PSA 3.19 04/06/2015   INR 0.97 09/08/2014   HGBA1C 5.2 10/13/2014    No results found.  Assessment & Plan:   Jonathan [name] was seen today for gastroesophageal reflux, osteoarthritis and back pain.  Diagnoses and all orders for this visit:  GAD (generalized anxiety disorder) -     ALPRAZolam (XANAX) 1 MG tablet; Take 1 tablet (1 mg total) by mouth 3 (three) times daily as needed. for anxiety  Spinal stenosis of lumbar  region -     Discontinue: HYDROcodone-acetaminophen (NORCO) 10-325 MG tablet; Take 1 tablet by mouth every 6 (six) hours as needed. -     meloxicam (MOBIC) 15 MG tablet; Take 1 tablet (15 mg total) by mouth daily. -     Discontinue: HYDROcodone-acetaminophen (NORCO) 10-325 MG tablet; Take 1 tablet by mouth every 6 (six) hours as needed. -     Discontinue: HYDROcodone-acetaminophen (NORCO) 10-325 MG tablet; Take 1 tablet by mouth every 6 (six) hours as needed. -     HYDROcodone-acetaminophen (NORCO) 10-325 MG tablet; Take 1 tablet by mouth every 6 (six) hours as needed. -     Discontinue: HYDROcodone-acetaminophen (NORCO) 10-325 MG tablet; Take 1 tablet by mouth every 6 (six) hours as needed. -     HYDROcodone-acetaminophen (NORCO) 10-325 MG tablet; Take 1 tablet by mouth every 6 (six) hours as needed.  Avascular necrosis of bones of both hips (  [place])- he will continue Norco as needed for pain, I have asked him to take the meloxicam more consistently. -     Discontinue: HYDROcodone-acetaminophen (NORCO) 10-325 MG tablet; Take 1 tablet by mouth every 6 (six) hours as needed. -     meloxicam (MOBIC) 15 MG tablet; Take 1 tablet (15 mg total) by mouth daily. -     Discontinue: HYDROcodone-acetaminophen (NORCO) 10-325 MG tablet; Take 1 tablet by mouth every 6 (six) hours as needed. -     Discontinue: HYDROcodone-acetaminophen (NORCO) 10-325 MG tablet; Take 1 tablet by mouth every 6 (six) hours as needed. -     HYDROcodone-acetaminophen (NORCO) 10-325 MG tablet; Take 1 tablet by mouth every 6 (six) hours as needed. -     Discontinue: HYDROcodone-acetaminophen (NORCO) 10-325 MG tablet; Take 1 tablet by mouth every 6 (six) hours as needed. -     HYDROcodone-acetaminophen (NORCO) 10-325 MG tablet; Take 1 tablet by mouth every 6 (six) hours as needed.  BPH (benign prostatic hypertrophy) with urinary obstruction -     tamsulosin (FLOMAX) 0.4 MG CAPS capsule; Take 1 capsule (0.4 mg total) by mouth  daily.  Gastroesophageal reflux disease with esophagitis- I will start treating this with a proton pump inhibitor -     dexlansoprazole (DEXILANT) 60 MG capsule; Take 1 capsule (60 mg total) by mouth daily.  Other orders -     Cancel: sucralfate (CARAFATE) 1 g tablet; Take 1 tablet (1 g total) by mouth 4 (four) times daily.   I have discontinued Jonathan [name] omeprazole, sucralfate, and traMADol. I am also having him start on dexlansoprazole. Additionally, I am having him maintain his cetirizine, Betamethasone Valerate, Azelastine-Fluticasone, ALPRAZolam, meloxicam, tamsulosin, HYDROcodone-acetaminophen, and HYDROcodone-acetaminophen.  Meds ordered this encounter  Medications  . ALPRAZolam (XANAX) 1 MG tablet    Sig: Take 1 tablet (1 mg total) by mouth 3 (three) times daily as needed. for anxiety    Dispense:  90 tablet    Refill:  3  . DISCONTD: HYDROcodone-acetaminophen (NORCO) 10-325 MG tablet    Sig: Take 1 tablet by mouth every 6 (six) hours as needed.    Dispense:  120 tablet    Refill:  0    Fill on or after 07/22/15  . meloxicam (MOBIC) 15 MG tablet    Sig: Take 1 tablet (15 mg total) by mouth daily.    Dispense:  30 tablet    Refill:  3  . tamsulosin (FLOMAX) 0.4 MG CAPS capsule    Sig: Take 1 capsule (0.4 mg total) by mouth daily.    Dispense:  30 capsule    Refill:  11  . dexlansoprazole (DEXILANT) 60 MG capsule    Sig: Take 1 capsule (60 mg total) by mouth daily.    Dispense:  30 capsule    Refill:  11  . DISCONTD: HYDROcodone-acetaminophen (NORCO) 10-325 MG tablet    Sig: Take 1 tablet by mouth every 6 (six) hours as needed.    Dispense:  120 tablet    Refill:  0    Fill on or after 08/19/15  . DISCONTD: HYDROcodone-acetaminophen (NORCO) 10-325 MG tablet    Sig: Take 1 tablet by mouth every 6 (six) hours as needed.    Dispense:  120 tablet    Refill:  0    Fill on or after 09/19/15  . HYDROcodone-acetaminophen (NORCO) 10-325 MG tablet    Sig: Take 1 tablet by  mouth every 6 (six) hours as needed.    Dispense:  120 tablet    Refill:  0    Fill on or after 09/19/15  . DISCONTD: HYDROcodone-acetaminophen (NORCO) 10-325 MG tablet    Sig: Take 1 tablet by mouth every 6 (six) hours as needed.    Dispense:  120 tablet    Refill:  0    Fill on or after 07/22/15  . HYDROcodone-acetaminophen (NORCO) 10-325 MG tablet    Sig: Take 1 tablet by mouth every 6 (six) hours as needed.    Dispense:  120 tablet    Refill:  0    Fill on or after 10/19/15     Follow-up: Return in about 4 months (around 11/20/2015).  Scarlette Calico, MD

## 2015-08-12 ENCOUNTER — Other Ambulatory Visit: Payer: Self-pay | Admitting: Internal Medicine

## 2015-08-13 NOTE — Telephone Encounter (Signed)
Please advise, thanks.

## 2015-11-16 ENCOUNTER — Encounter: Payer: Self-pay | Admitting: Internal Medicine

## 2015-11-16 ENCOUNTER — Ambulatory Visit (INDEPENDENT_AMBULATORY_CARE_PROVIDER_SITE_OTHER): Payer: Medicare Other | Admitting: Internal Medicine

## 2015-11-16 VITALS — BP 120/86 | HR 57 | Temp 98.0°F | Resp 16 | Ht 73.0 in | Wt 188.0 lb

## 2015-11-16 DIAGNOSIS — M87052 Idiopathic aseptic necrosis of left femur: Secondary | ICD-10-CM | POA: Diagnosis not present

## 2015-11-16 DIAGNOSIS — M87051 Idiopathic aseptic necrosis of right femur: Secondary | ICD-10-CM | POA: Diagnosis not present

## 2015-11-16 DIAGNOSIS — Z23 Encounter for immunization: Secondary | ICD-10-CM

## 2015-11-16 DIAGNOSIS — M48061 Spinal stenosis, lumbar region without neurogenic claudication: Secondary | ICD-10-CM

## 2015-11-16 DIAGNOSIS — M4806 Spinal stenosis, lumbar region: Secondary | ICD-10-CM | POA: Diagnosis not present

## 2015-11-16 DIAGNOSIS — F411 Generalized anxiety disorder: Secondary | ICD-10-CM | POA: Diagnosis not present

## 2015-11-16 MED ORDER — HYDROCODONE-ACETAMINOPHEN 10-325 MG PO TABS: 1.0000 | ORAL_TABLET | Freq: Four times a day (QID) | ORAL | Status: DC | PRN

## 2015-11-16 MED ORDER — ALPRAZOLAM 1 MG PO TABS: 1.0000 mg | ORAL_TABLET | Freq: Three times a day (TID) | ORAL | Status: DC | PRN

## 2015-11-16 NOTE — Progress Notes (Signed)
Subjective:  Patient ID: Jonathan [name], male    DOB: May 26, 1956  Age: 60 y.o. MRN: [id number]  CC: Osteoarthritis   HPI Success presents for follow-up on osteoarthritis/AVN in his hips. About a year and a half ago he underwent right hip replacement and now tells me that the left hip is becoming more painful and is starting to give way. He wants to go back to see the same orthopedic surgeon he did last time. He tells me the Norco medication helps control the pain. He also complains of anxiety and wants a refill on Xanax.  Outpatient Prescriptions Prior to Visit  Medication Sig Dispense Refill  . Azelastine-Fluticasone (DYMISTA) 137-50 MCG/ACT SUSP Place 2 Act into the nose 2 (two) times daily. 23 g 11  . Betamethasone Valerate (LUXIQ) 0.12 % foam Apply 1 Act topically 2 (two) times daily. 100 g 2  . cetirizine (ZYRTEC) 10 MG tablet Take 1 tablet (10 mg total) by mouth daily. 30 tablet 11  . dexlansoprazole (DEXILANT) 60 MG capsule Take 1 capsule (60 mg total) by mouth daily. 30 capsule 11  . ibuprofen (ADVIL,MOTRIN) 800 MG tablet TAKE ONE TABLET BY MOUTH EVERY 8 HOURS AS NEEDED 90 tablet 5  . tamsulosin (FLOMAX) 0.4 MG CAPS capsule Take 1 capsule (0.4 mg total) by mouth daily. 30 capsule 11  . ALPRAZolam (XANAX) 1 MG tablet Take 1 tablet (1 mg total) by mouth 3 (three) times daily as needed. for anxiety 90 tablet 3  . HYDROcodone-acetaminophen (NORCO) 10-325 MG tablet Take 1 tablet by mouth every 6 (six) hours as needed. 120 tablet 0  . HYDROcodone-acetaminophen (NORCO) 10-325 MG tablet Take 1 tablet by mouth every 6 (six) hours as needed. 120 tablet 0  . meloxicam (MOBIC) 15 MG tablet Take 1 tablet (15 mg total) by mouth daily. 30 tablet 3   No facility-administered medications prior to visit.    ROS Review of Systems  Constitutional: Negative.  Negative for fever, chills, diaphoresis, appetite change and fatigue.  HENT: Negative.   Eyes: Negative.   Respiratory:  Negative.  Negative for cough, choking, chest tightness, shortness of breath and stridor.   Cardiovascular: Negative.  Negative for chest pain, palpitations and leg swelling.  Gastrointestinal: Negative.  Negative for nausea, vomiting, abdominal pain, diarrhea, constipation and blood in stool.  Endocrine: Negative.   Genitourinary: Negative.  Negative for difficulty urinating.  Musculoskeletal: Positive for back pain and arthralgias. Negative for myalgias, joint swelling, gait problem and neck pain.  Skin: Negative.  Negative for color change, pallor and rash.  Allergic/Immunologic: Negative.   Neurological: Negative.  Negative for dizziness, tremors, syncope, weakness, light-headedness, numbness and headaches.  Hematological: Negative.  Negative for adenopathy. Does not bruise/bleed easily.  Psychiatric/Behavioral: Negative.     Objective:  BP 120/86 mmHg  Pulse 57  Temp(Src) 98 F (36.7 C) (Oral)  Ht 6\' 1"  (1.854 m)  Wt 188 lb (85.276 kg)  BMI 24.81 kg/m2  SpO2 98%  BP Readings from Last 3 Encounters:  11/16/15 120/86  07/23/15 116/80  04/21/15 120/78    Wt Readings from Last 3 Encounters:  11/16/15 188 lb (85.276 kg)  07/23/15 185 lb (83.915 kg)  04/21/15 176 lb (79.833 kg)    Physical Exam  Constitutional: He is oriented to person, place, and time. No distress.  HENT:  Mouth/Throat: Oropharynx is clear and moist. No oropharyngeal exudate.  Eyes: Conjunctivae are normal. Right eye exhibits no discharge. Left eye exhibits no discharge. No scleral icterus.  Neck: Normal range of motion. Neck supple. No JVD present. No tracheal deviation present. No thyromegaly present.  Cardiovascular: Normal rate, regular rhythm, normal heart sounds and intact distal pulses.  Exam reveals no gallop and no friction rub.   No murmur heard. Pulmonary/Chest: Effort normal and breath sounds normal. No stridor. No respiratory distress. He has no wheezes. He has no rales. He exhibits no  tenderness.  Abdominal: Soft. Bowel sounds are normal. He exhibits no distension and no mass. There is no tenderness. There is no rebound and no guarding.  Musculoskeletal: He exhibits no edema or tenderness.       Left hip: He exhibits decreased range of motion. He exhibits normal strength, no tenderness, no bony tenderness, no swelling, no crepitus and no deformity.  Lymphadenopathy:    He has no cervical adenopathy.  Neurological: He is alert and oriented to person, place, and time. He has normal strength. He displays no atrophy and no tremor. No cranial nerve deficit or sensory deficit. He exhibits normal muscle tone. He displays a negative Romberg sign. He displays no seizure activity. Coordination and gait normal.  Neg SLR in BLE  Skin: Skin is warm and dry. No rash noted. He is not diaphoretic. No erythema. No pallor.  Vitals reviewed.   Lab Results  Component Value Date   WBC 3.9* 04/06/2015   HGB 16.9 04/06/2015   HCT 51.0 04/06/2015   PLT 256.0 04/06/2015   GLUCOSE 83 04/06/2015   CHOL 182 10/13/2014   TRIG 53.0 10/13/2014   HDL 59.60 10/13/2014   LDLDIRECT 131.6 07/14/2011   LDLCALC 112* 10/13/2014   ALT 52 04/06/2015   AST 32 04/06/2015   NA 137 04/06/2015   K 4.5 04/06/2015   CL 100 04/06/2015   CREATININE 0.95 04/06/2015   BUN 7 04/06/2015   CO2 29 04/06/2015   TSH 0.65 07/14/2011   PSA 3.19 04/06/2015   INR 0.97 09/08/2014   HGBA1C 5.2 10/13/2014    No results found.  Assessment & Plan:   Jonathan [name] was seen today for osteoarthritis.  Diagnoses and all orders for this visit:  Spinal stenosis of lumbar region -     Discontinue: HYDROcodone-acetaminophen (NORCO) 10-325 MG tablet; Take 1 tablet by mouth every 6 (six) hours as needed. -     HYDROcodone-acetaminophen (NORCO) 10-325 MG tablet; Take 1 tablet by mouth every 6 (six) hours as needed. -     HYDROcodone-acetaminophen (NORCO) 10-325 MG tablet; Take 1 tablet by mouth every 6 (six) hours as  needed.  Avascular necrosis of bones of both hips (HCC) -     Discontinue: HYDROcodone-acetaminophen (NORCO) 10-325 MG tablet; Take 1 tablet by mouth every 6 (six) hours as needed. -     HYDROcodone-acetaminophen (NORCO) 10-325 MG tablet; Take 1 tablet by mouth every 6 (six) hours as needed. -     HYDROcodone-acetaminophen (NORCO) 10-325 MG tablet; Take 1 tablet by mouth every 6 (six) hours as needed. -     Ambulatory referral to Orthopedic Surgery  GAD (generalized anxiety disorder) -     ALPRAZolam (XANAX) 1 MG tablet; Take 1 tablet (1 mg total) by mouth 3 (three) times daily as needed. for anxiety  Need for prophylactic vaccination against hepatitis A -     Hepatitis A vaccine adult IM   I have discontinued Mr. Vizcaya meloxicam. I am also having him maintain his cetirizine, Betamethasone Valerate, Azelastine-Fluticasone, tamsulosin, dexlansoprazole, ibuprofen, HYDROcodone-acetaminophen, HYDROcodone-acetaminophen, and ALPRAZolam.  Meds ordered this encounter  Medications  .  DISCONTD: HYDROcodone-acetaminophen (NORCO) 10-325 MG tablet    Sig: Take 1 tablet by mouth every 6 (six) hours as needed.    Dispense:  120 tablet    Refill:  0    Fill on or after 11/19/15  . HYDROcodone-acetaminophen (NORCO) 10-325 MG tablet    Sig: Take 1 tablet by mouth every 6 (six) hours as needed.    Dispense:  120 tablet    Refill:  0    Fill on or after 01/19/16  . HYDROcodone-acetaminophen (NORCO) 10-325 MG tablet    Sig: Take 1 tablet by mouth every 6 (six) hours as needed.    Dispense:  120 tablet    Refill:  0    Fill on or after 12/19/15  . ALPRAZolam (XANAX) 1 MG tablet    Sig: Take 1 tablet (1 mg total) by mouth 3 (three) times daily as needed. for anxiety    Dispense:  90 tablet    Refill:  3     Follow-up: Return in about 4 months (around 03/17/2016).  Scarlette Calico, MD

## 2015-11-16 NOTE — Patient Instructions (Signed)

## 2015-11-16 NOTE — Progress Notes (Signed)
Pre visit review using our clinic review tool, if applicable. No additional management support is needed unless otherwise documented below in the visit note. 

## 2015-12-05 ENCOUNTER — Other Ambulatory Visit: Payer: Self-pay | Admitting: Internal Medicine

## 2015-12-08 NOTE — Telephone Encounter (Signed)
meloxicam is not on patient's current med list---last took in 08/2014---please advise in [name] absence, thanks

## 2016-02-11 ENCOUNTER — Telehealth: Payer: Self-pay | Admitting: *Deleted

## 2016-02-11 DIAGNOSIS — M48061 Spinal stenosis, lumbar region without neurogenic claudication: Secondary | ICD-10-CM

## 2016-02-11 DIAGNOSIS — F411 Generalized anxiety disorder: Secondary | ICD-10-CM

## 2016-02-11 DIAGNOSIS — M87051 Idiopathic aseptic necrosis of right femur: Secondary | ICD-10-CM

## 2016-02-11 DIAGNOSIS — M87052 Idiopathic aseptic necrosis of left femur: Secondary | ICD-10-CM

## 2016-02-11 NOTE — Telephone Encounter (Signed)
OK 1 week supply on both Thx

## 2016-02-11 NOTE — Telephone Encounter (Signed)
Rec'd call pt states he will be out of his medications by the time he see Dr. [name] on 9/28. Was told md is out of the office, but will need a week worth on the hydrocodone & alprazolam. Inform pt will send to one of the covering MD. Pt will be out of on 9/21. Is it ok to give a week supply until appt...Jonathan [name]

## 2016-02-12 MED ORDER — ALPRAZOLAM 1 MG PO TABS: 1.0000 mg | ORAL_TABLET | Freq: Three times a day (TID) | ORAL | 0 refills | Status: DC | PRN

## 2016-02-12 MED ORDER — HYDROCODONE-ACETAMINOPHEN 10-325 MG PO TABS: 1.0000 | ORAL_TABLET | Freq: Four times a day (QID) | ORAL | 0 refills | Status: DC | PRN

## 2016-02-12 NOTE — Telephone Encounter (Signed)
Notified pt rx's ready for pick-up../lmb 

## 2016-02-25 ENCOUNTER — Ambulatory Visit (INDEPENDENT_AMBULATORY_CARE_PROVIDER_SITE_OTHER): Payer: Medicare Other | Admitting: Internal Medicine

## 2016-02-25 ENCOUNTER — Encounter: Payer: Self-pay | Admitting: Internal Medicine

## 2016-02-25 VITALS — BP 126/88 | HR 88 | Temp 98.1°F | Resp 16 | Ht 73.0 in | Wt 182.5 lb

## 2016-02-25 DIAGNOSIS — F411 Generalized anxiety disorder: Secondary | ICD-10-CM | POA: Diagnosis not present

## 2016-02-25 DIAGNOSIS — M87052 Idiopathic aseptic necrosis of left femur: Secondary | ICD-10-CM

## 2016-02-25 DIAGNOSIS — M48061 Spinal stenosis, lumbar region without neurogenic claudication: Secondary | ICD-10-CM

## 2016-02-25 DIAGNOSIS — J302 Other seasonal allergic rhinitis: Secondary | ICD-10-CM

## 2016-02-25 DIAGNOSIS — M4806 Spinal stenosis, lumbar region: Secondary | ICD-10-CM | POA: Diagnosis not present

## 2016-02-25 DIAGNOSIS — M87051 Idiopathic aseptic necrosis of right femur: Secondary | ICD-10-CM | POA: Diagnosis not present

## 2016-02-25 MED ORDER — CETIRIZINE HCL 10 MG PO TABS: 10.0000 mg | ORAL_TABLET | Freq: Every day | ORAL | 11 refills | Status: DC

## 2016-02-25 MED ORDER — HYDROCODONE-ACETAMINOPHEN 10-325 MG PO TABS: 1.0000 | ORAL_TABLET | Freq: Four times a day (QID) | ORAL | 0 refills | Status: DC | PRN

## 2016-02-25 MED ORDER — ALPRAZOLAM 1 MG PO TABS: 1.0000 mg | ORAL_TABLET | Freq: Three times a day (TID) | ORAL | 3 refills | Status: DC | PRN

## 2016-02-25 MED ORDER — AZELASTINE-FLUTICASONE 137-50 MCG/ACT NA SUSP: 2.0000 | Freq: Two times a day (BID) | NASAL | 11 refills | Status: DC

## 2016-02-25 NOTE — Progress Notes (Signed)
Subjective:  Patient ID: Jonathan [name], male    DOB: Nov 25, 1955  Age: 60 y.o. MRN: [id number]  CC: Osteoarthritis   HPI [place] presents for follow-up on pain and generalized anxiety. He complains of worsening pain in his low back, his left hip, and his left lower extremity. He saw his orthopedic surgeon about 3 or 4 months ago and though no xrays were done he was told that his pain is not coming from his have left hip but he continues to believe that that is the source of his pain. He also has back pain as well on a history of spinal stenosis. He is getting adequate symptom relief with Norco. He also complains of intermittent anxiety and back spasms and says Xanax helps with that as well. He denies numbness/weakness/tingling in his lower extremities.  Outpatient Medications Prior to Visit  Medication Sig Dispense Refill  . dexlansoprazole (DEXILANT) 60 MG capsule Take 1 capsule (60 mg total) by mouth daily. 30 capsule 11  . ibuprofen (ADVIL,MOTRIN) 800 MG tablet TAKE ONE TABLET BY MOUTH EVERY 8 HOURS AS NEEDED 90 tablet 5  . tamsulosin (FLOMAX) 0.4 MG CAPS capsule Take 1 capsule (0.4 mg total) by mouth daily. 30 capsule 11  . ALPRAZolam (XANAX) 1 MG tablet Take 1 tablet (1 mg total) by mouth 3 (three) times daily as needed. 1 week supply may fill on 02/17/16 21 tablet 0  . Azelastine-Fluticasone (DYMISTA) 137-50 MCG/ACT SUSP Place 2 Act into the nose 2 (two) times daily. 23 g 11  . Betamethasone Valerate (LUXIQ) 0.12 % foam Apply 1 Act topically 2 (two) times daily. 100 g 2  . cetirizine (ZYRTEC) 10 MG tablet Take 1 tablet (10 mg total) by mouth daily. 30 tablet 11  . HYDROcodone-acetaminophen (NORCO) 10-325 MG tablet Take 1 tablet by mouth every 6 (six) hours as needed. 120 tablet 0  . HYDROcodone-acetaminophen (NORCO) 10-325 MG tablet Take 1 tablet by mouth every 6 (six) hours as needed. 1 week supply can fill on 02/19/16 10 tablet 0  . meloxicam (MOBIC) 15 MG tablet TAKE 1  TABLET EVERY DAY 30 tablet 1   No facility-administered medications prior to visit.     ROS Review of Systems  Constitutional: Negative.  Negative for activity change, appetite change, diaphoresis, fatigue and fever.  HENT: Positive for congestion, postnasal drip and rhinorrhea. Negative for facial swelling, sinus pressure, sore throat and trouble swallowing.   Eyes: Negative.  Negative for photophobia and visual disturbance.  Respiratory: Negative.   Cardiovascular: Negative.  Negative for chest pain, palpitations and leg swelling.  Gastrointestinal: Negative.  Negative for abdominal pain, constipation, diarrhea, nausea and vomiting.  Endocrine: Negative.   Genitourinary: Negative.  Negative for difficulty urinating.  Musculoskeletal: Positive for arthralgias and back pain. Negative for joint swelling, myalgias and neck pain.  Skin: Negative.   Allergic/Immunologic: Negative.   Neurological: Negative.  Negative for dizziness, tremors, weakness and numbness.  Hematological: Negative.  Negative for adenopathy. Does not bruise/bleed easily.  Psychiatric/Behavioral: Negative for behavioral problems, decreased concentration, dysphoric mood, sleep disturbance and suicidal ideas. The patient is nervous/anxious.     Objective:  BP 126/88 (BP Location: Left Arm, Patient Position: Sitting, Cuff Size: Normal)   Pulse 88   Temp 98.1 F (36.7 C) (Oral)   Resp 16   Ht 6\' 1"  (1.854 m)   Wt 182 lb 8 oz (82.8 kg)   SpO2 96%   BMI 24.08 kg/m   BP Readings from Last 3  Encounters:  02/25/16 126/88  11/16/15 120/86  07/23/15 116/80    Wt Readings from Last 3 Encounters:  02/25/16 182 lb 8 oz (82.8 kg)  11/16/15 188 lb (85.3 kg)  07/23/15 185 lb (83.9 kg)    Physical Exam  Constitutional: He is oriented to person, place, and time. No distress.  HENT:  Mouth/Throat: Oropharynx is clear and moist. No oropharyngeal exudate.  Eyes: Conjunctivae are normal. Right eye exhibits no discharge.  Left eye exhibits no discharge. No scleral icterus.  Neck: Normal range of motion. Neck supple. No JVD present. No tracheal deviation present. No thyromegaly present.  Cardiovascular: Normal rate, normal heart sounds and intact distal pulses.  Exam reveals no gallop and no friction rub.   No murmur heard. Pulmonary/Chest: Effort normal and breath sounds normal. No stridor. No respiratory distress. He has no wheezes. He has no rales. He exhibits no tenderness.  Abdominal: Soft. Bowel sounds are normal. He exhibits no distension and no mass. There is no tenderness. There is no rebound and no guarding.  Musculoskeletal: Normal range of motion. He exhibits no edema, tenderness or deformity.  Lymphadenopathy:    He has no cervical adenopathy.  Neurological: He is oriented to person, place, and time.  Skin: Skin is warm and dry. No rash noted. He is not diaphoretic. No erythema. No pallor.  Psychiatric: He has a normal mood and affect. His behavior is normal. Judgment and thought content normal.  Vitals reviewed.   Lab Results  Component Value Date   WBC 3.9 (L) 04/06/2015   HGB 16.9 04/06/2015   HCT 51.0 04/06/2015   PLT 256.0 04/06/2015   GLUCOSE 83 04/06/2015   CHOL 182 10/13/2014   TRIG 53.0 10/13/2014   HDL 59.60 10/13/2014   LDLDIRECT 131.6 07/14/2011   LDLCALC 112 (H) 10/13/2014   ALT 52 04/06/2015   AST 32 04/06/2015   NA 137 04/06/2015   K 4.5 04/06/2015   CL 100 04/06/2015   CREATININE 0.95 04/06/2015   BUN 7 04/06/2015   CO2 29 04/06/2015   TSH 0.65 07/14/2011   PSA 3.19 04/06/2015   INR 0.97 09/08/2014   HGBA1C 5.2 10/13/2014    No results found.  Assessment & Plan:   Hosam was seen today for osteoarthritis.  Diagnoses and all orders for this visit:  Avascular necrosis of bones of both hips ([place])- will continue to treat his pain with Norco, I have asked him to see his orthopedic surgeon again for reevaluation. -     Discontinue: HYDROcodone-acetaminophen  (NORCO) 10-325 MG tablet; Take 1 tablet by mouth every 6 (six) hours as needed. -     Discontinue: HYDROcodone-acetaminophen (NORCO) 10-325 MG tablet; Take 1 tablet by mouth every 6 (six) hours as needed. -     Discontinue: HYDROcodone-acetaminophen (NORCO) 10-325 MG tablet; Take 1 tablet by mouth every 6 (six) hours as needed. -     HYDROcodone-acetaminophen (NORCO) 10-325 MG tablet; Take 1 tablet by mouth every 6 (six) hours as needed. -     Ambulatory referral to Orthopedic Surgery  Other seasonal allergic rhinitis -     Discontinue: cetirizine (ZYRTEC) 10 MG tablet; Take 1 tablet (10 mg total) by mouth daily. -     Azelastine-Fluticasone (DYMISTA) 137-50 MCG/ACT SUSP; Place 2 Act into the nose 2 (two) times daily. -     cetirizine (ZYRTEC) 10 MG tablet; Take 1 tablet (10 mg total) by mouth daily.  Spinal stenosis of lumbar region- his pain may be coming from  his lower back so I've asked him to see an orthopedist that does back surgeries by the name of Max: -     Discontinue: HYDROcodone-acetaminophen (NORCO) 10-325 MG tablet; Take 1 tablet by mouth every 6 (six) hours as needed. -     Discontinue: HYDROcodone-acetaminophen (NORCO) 10-325 MG tablet; Take 1 tablet by mouth every 6 (six) hours as needed. -     Discontinue: HYDROcodone-acetaminophen (NORCO) 10-325 MG tablet; Take 1 tablet by mouth every 6 (six) hours as needed. -     HYDROcodone-acetaminophen (NORCO) 10-325 MG tablet; Take 1 tablet by mouth every 6 (six) hours as needed. -     Ambulatory referral to Orthopedic Surgery  GAD (generalized anxiety disorder) -     ALPRAZolam (XANAX) 1 MG tablet; Take 1 tablet (1 mg total) by mouth 3 (three) times daily as needed.   I have discontinued Mr. Toma Betamethasone Valerate and meloxicam. I have also changed his ALPRAZolam. Additionally, I am having him maintain his tamsulosin, dexlansoprazole, ibuprofen, Azelastine-Fluticasone, cetirizine, and HYDROcodone-acetaminophen.  Meds  ordered this encounter  Medications  . DISCONTD: cetirizine (ZYRTEC) 10 MG tablet    Sig: Take 1 tablet (10 mg total) by mouth daily.    Dispense:  30 tablet    Refill:  11  . Azelastine-Fluticasone (DYMISTA) 137-50 MCG/ACT SUSP    Sig: Place 2 Act into the nose 2 (two) times daily.    Dispense:  23 g    Refill:  11  . cetirizine (ZYRTEC) 10 MG tablet    Sig: Take 1 tablet (10 mg total) by mouth daily.    Dispense:  30 tablet    Refill:  11  . ALPRAZolam (XANAX) 1 MG tablet    Sig: Take 1 tablet (1 mg total) by mouth 3 (three) times daily as needed.    Dispense:  90 tablet    Refill:  3    May fill on 02/17/16  . DISCONTD: HYDROcodone-acetaminophen (NORCO) 10-325 MG tablet    Sig: Take 1 tablet by mouth every 6 (six) hours as needed.    Dispense:  100 tablet    Refill:  0    Fill on or after 02/25/16  . DISCONTD: HYDROcodone-acetaminophen (NORCO) 10-325 MG tablet    Sig: Take 1 tablet by mouth every 6 (six) hours as needed.    Dispense:  100 tablet    Refill:  0    Fill on or after 03/26/16  . DISCONTD: HYDROcodone-acetaminophen (NORCO) 10-325 MG tablet    Sig: Take 1 tablet by mouth every 6 (six) hours as needed.    Dispense:  100 tablet    Refill:  0    Fill on or after 04/26/16  . HYDROcodone-acetaminophen (NORCO) 10-325 MG tablet    Sig: Take 1 tablet by mouth every 6 (six) hours as needed.    Dispense:  100 tablet    Refill:  0    Fill on or after 05/26/16     Follow-up: Return in about 4 months (around 06/26/2016).  Scarlette Calico, MD

## 2016-02-25 NOTE — Patient Instructions (Signed)

## 2016-03-10 ENCOUNTER — Telehealth: Payer: Self-pay | Admitting: Emergency Medicine

## 2016-03-10 NOTE — Telephone Encounter (Signed)
Pt came in and was asking abou his referral. He stated [name] wanted to send him for an MRI. If he still wants this done can he please put a referral in. Thanks.

## 2016-03-14 NOTE — Telephone Encounter (Signed)
Please advise regarding MRI.

## 2016-03-15 NOTE — Telephone Encounter (Signed)
MRIs ordered.

## 2016-03-15 NOTE — Addendum Note (Signed)
Addended by: Janith Lima on: 03/15/2016 10:38 AM   Modules accepted: Orders

## 2016-03-16 NOTE — Telephone Encounter (Addendum)
Pt informed and MRI's are scheduled

## 2016-03-22 ENCOUNTER — Telehealth: Payer: Self-pay

## 2016-03-22 MED ORDER — TAMSULOSIN HCL 0.4 MG PO CAPS: 0.4000 mg | ORAL_CAPSULE | Freq: Every day | ORAL | 2 refills | Status: DC

## 2016-03-22 NOTE — Telephone Encounter (Signed)
Pharmacy rq 90 day for tamsulosin. erx sent.

## 2016-03-26 ENCOUNTER — Ambulatory Visit
Admission: RE | Admit: 2016-03-26 | Discharge: 2016-03-26 | Disposition: A | Discharge status: Home / Self Care | Payer: Medicare Other | Source: Ambulatory Visit | Attending: Internal Medicine | Admitting: Internal Medicine

## 2016-03-26 DIAGNOSIS — M87051 Idiopathic aseptic necrosis of right femur: Secondary | ICD-10-CM

## 2016-03-26 DIAGNOSIS — M87052 Idiopathic aseptic necrosis of left femur: Principal | ICD-10-CM

## 2016-03-26 DIAGNOSIS — M48061 Spinal stenosis, lumbar region without neurogenic claudication: Secondary | ICD-10-CM

## 2016-03-27 ENCOUNTER — Encounter: Payer: Self-pay | Admitting: Internal Medicine

## 2016-04-11 ENCOUNTER — Telehealth: Payer: Self-pay

## 2016-04-11 NOTE — Telephone Encounter (Signed)
Fax received from [facility].  Appt was sched for 03/31/16 but pt canceled without reason. Referral has been canceled.

## 2016-06-14 ENCOUNTER — Encounter: Payer: Self-pay | Admitting: Internal Medicine

## 2016-06-14 ENCOUNTER — Ambulatory Visit (INDEPENDENT_AMBULATORY_CARE_PROVIDER_SITE_OTHER): Payer: Medicare Other | Admitting: Internal Medicine

## 2016-06-14 VITALS — BP 130/90 | HR 70 | Temp 97.5°F | Ht 73.0 in | Wt 189.1 lb

## 2016-06-14 DIAGNOSIS — M87052 Idiopathic aseptic necrosis of left femur: Secondary | ICD-10-CM

## 2016-06-14 DIAGNOSIS — F411 Generalized anxiety disorder: Secondary | ICD-10-CM | POA: Diagnosis not present

## 2016-06-14 DIAGNOSIS — J0100 Acute maxillary sinusitis, unspecified: Secondary | ICD-10-CM

## 2016-06-14 DIAGNOSIS — M48061 Spinal stenosis, lumbar region without neurogenic claudication: Secondary | ICD-10-CM | POA: Diagnosis not present

## 2016-06-14 DIAGNOSIS — Z23 Encounter for immunization: Secondary | ICD-10-CM

## 2016-06-14 DIAGNOSIS — M87051 Idiopathic aseptic necrosis of right femur: Secondary | ICD-10-CM

## 2016-06-14 MED ORDER — HYDROCODONE-ACETAMINOPHEN 10-325 MG PO TABS: 1.0000 | ORAL_TABLET | Freq: Four times a day (QID) | ORAL | 0 refills | Status: DC | PRN

## 2016-06-14 MED ORDER — ALPRAZOLAM 1 MG PO TABS: 1.0000 mg | ORAL_TABLET | Freq: Three times a day (TID) | ORAL | 3 refills | Status: DC | PRN

## 2016-06-14 MED ORDER — PROMETHAZINE-DM 6.25-15 MG/5ML PO SYRP: 5.0000 mL | ORAL_SOLUTION | Freq: Four times a day (QID) | ORAL | 0 refills | Status: DC | PRN

## 2016-06-14 MED ORDER — CEFDINIR 300 MG PO CAPS: 300.0000 mg | ORAL_CAPSULE | Freq: Two times a day (BID) | ORAL | 0 refills | Status: AC

## 2016-06-14 NOTE — Patient Instructions (Signed)

## 2016-06-14 NOTE — Progress Notes (Signed)
Subjective:  Patient ID: Jonathan [name], male    DOB: 13-Jan-1956  Age: 61 y.o. MRN: [id number]  CC: Sinusitis   HPI Jonathan [name] presents for A one-week history of runny nose, nasal congestion, sinus pain, cough productive of clear phlegm, and chills but no fever.  He also complains of chronic hip and back pain. He wants to be referred to an orthopedic surgeon to see if surgery is an option for the AVN in his hips.  Outpatient Medications Prior to Visit  Medication Sig Dispense Refill  . dexlansoprazole (DEXILANT) 60 MG capsule Take 1 capsule (60 mg total) by mouth daily. 30 capsule 11  . ibuprofen (ADVIL,MOTRIN) 800 MG tablet TAKE ONE TABLET BY MOUTH EVERY 8 HOURS AS NEEDED 90 tablet 5  . tamsulosin (FLOMAX) 0.4 MG CAPS capsule Take 1 capsule (0.4 mg total) by mouth daily. 90 capsule 2  . ALPRAZolam (XANAX) 1 MG tablet Take 1 tablet (1 mg total) by mouth 3 (three) times daily as needed. 90 tablet 3  . HYDROcodone-acetaminophen (NORCO) 10-325 MG tablet Take 1 tablet by mouth every 6 (six) hours as needed. 100 tablet 0  . Azelastine-Fluticasone (DYMISTA) 137-50 MCG/ACT SUSP Place 2 Act into the nose 2 (two) times daily. (Patient not taking: Reported on 06/14/2016) 23 g 11  . cetirizine (ZYRTEC) 10 MG tablet Take 1 tablet (10 mg total) by mouth daily. (Patient not taking: Reported on 06/14/2016) 30 tablet 11   No facility-administered medications prior to visit.     ROS Review of Systems  Constitutional: Positive for chills. Negative for appetite change, fatigue and fever.  HENT: Positive for congestion, postnasal drip, rhinorrhea, sinus pain and sinus pressure. Negative for facial swelling, sore throat, trouble swallowing and voice change.   Eyes: Negative for visual disturbance.  Respiratory: Positive for cough. Negative for choking, chest tightness, shortness of breath, wheezing and stridor.   Cardiovascular: Negative.  Negative for chest pain, palpitations and leg swelling.    Gastrointestinal: Negative for abdominal pain, constipation, diarrhea, nausea and vomiting.  Endocrine: Negative.   Genitourinary: Negative.   Musculoskeletal: Positive for arthralgias and back pain. Negative for joint swelling and myalgias.  Skin: Negative.  Negative for rash.  Allergic/Immunologic: Negative.   Neurological: Negative.   Hematological: Negative.  Negative for adenopathy. Does not bruise/bleed easily.  Psychiatric/Behavioral: Negative for agitation, behavioral problems, decreased concentration, dysphoric mood, sleep disturbance and suicidal ideas. The patient is nervous/anxious.     Objective:  BP 130/90 (BP Location: Right Arm, Patient Position: Sitting, Cuff Size: Normal)   Pulse 70   Temp 97.5 F (36.4 C) (Oral)   Ht 6\' 1"  (1.854 m)   Wt 189 lb 1.9 oz (85.8 kg)   SpO2 98%   BMI 24.95 kg/m   BP Readings from Last 3 Encounters:  06/14/16 130/90  02/25/16 126/88  11/16/15 120/86    Wt Readings from Last 3 Encounters:  06/14/16 189 lb 1.9 oz (85.8 kg)  02/25/16 182 lb 8 oz (82.8 kg)  11/16/15 188 lb (85.3 kg)    Physical Exam  Constitutional: He is oriented to person, place, and time. No distress.  HENT:  Nose: Rhinorrhea present. No mucosal edema or sinus tenderness. No epistaxis. Right sinus exhibits maxillary sinus tenderness. Left sinus exhibits maxillary sinus tenderness.  Mouth/Throat: Oropharynx is clear and moist and mucous membranes are normal. Mucous membranes are not pale, not dry and not cyanotic. No oropharyngeal exudate, posterior oropharyngeal edema, posterior oropharyngeal erythema or tonsillar abscesses.  Eyes:  Conjunctivae are normal. Right eye exhibits no discharge. Left eye exhibits no discharge. No scleral icterus.  Neck: Normal range of motion. Neck supple. No JVD present. No tracheal deviation present. No thyromegaly present.  Cardiovascular: Normal rate, regular rhythm, normal heart sounds and intact distal pulses.  Exam reveals no  gallop and no friction rub.   No murmur heard. Pulmonary/Chest: Effort normal and breath sounds normal. No stridor. No respiratory distress. He has no wheezes. He has no rales. He exhibits no tenderness.  Abdominal: Soft. Bowel sounds are normal. He exhibits no distension and no mass. There is no tenderness. There is no rebound and no guarding.  Musculoskeletal: Normal range of motion. He exhibits no edema, tenderness or deformity.  Lymphadenopathy:    He has no cervical adenopathy.  Neurological: He is oriented to person, place, and time.  Skin: Skin is warm and dry. No rash noted. He is not diaphoretic. No erythema. No pallor.  Vitals reviewed.   Lab Results  Component Value Date   WBC 3.9 (L) 04/06/2015   HGB 16.9 04/06/2015   HCT 51.0 04/06/2015   PLT 256.0 04/06/2015   GLUCOSE 83 04/06/2015   CHOL 182 10/13/2014   TRIG 53.0 10/13/2014   HDL 59.60 10/13/2014   LDLDIRECT 131.6 07/14/2011   LDLCALC 112 (H) 10/13/2014   ALT 52 04/06/2015   AST 32 04/06/2015   NA 137 04/06/2015   K 4.5 04/06/2015   CL 100 04/06/2015   CREATININE 0.95 04/06/2015   BUN 7 04/06/2015   CO2 29 04/06/2015   TSH 0.65 07/14/2011   PSA 3.19 04/06/2015   INR 0.97 09/08/2014   HGBA1C 5.2 10/13/2014    Mr Lumbar Spine Wo Contrast  Result Date: 03/26/2016 CLINICAL DATA:  Chronic pain in the low back since 17 and 7. Last surgery in 2000. Left leg pain. EXAM: MRI LUMBAR SPINE WITHOUT CONTRAST TECHNIQUE: Multiplanar, multisequence MR imaging of the lumbar spine was performed. No intravenous contrast was administered. COMPARISON:  11/05/2013 FINDINGS: Segmentation:  Standard. Alignment:  Physiologic. Vertebrae:  No fracture, evidence of discitis, or bone lesion. Conus medullaris: Extends to the T12-L1 level and appears normal. Paraspinal and other soft tissues: No paraspinal abnormality. Disc levels: Disc spaces: Degenerative disc disease with disc height loss and Modic endplate changes at 075-GRM. T12-L1: No  significant disc bulge. No evidence of neural foraminal stenosis. No central canal stenosis. L1-L2: No significant disc bulge. No evidence of neural foraminal stenosis. No central canal stenosis. L2-L3: Mild broad-based disc bulge. Mild bilateral facet arthropathy. Bilateral lateral recess narrowing. No evidence of neural foraminal stenosis. No central canal stenosis. L3-L4: Broad-based disc bulge. Mild bilateral facet arthropathy. Mild spinal stenosis. No evidence of neural foraminal stenosis. L4-L5: Broad-based disc bulge with a right lateral annular fissure. Mild bilateral facet arthropathy. No evidence of neural foraminal stenosis. No central canal stenosis. L5-S1: Broad-based disc osteophyte complex. Mild bilateral facet arthropathy. No evidence of neural foraminal stenosis. No central canal stenosis. IMPRESSION: 1. At L4-5 there is broad-based disc bulge with a right lateral annular fissure. Mild bilateral facet arthropathy. 2. At L3-4 there is a broad-based disc bulge. Mild bilateral facet arthropathy. Mild spinal stenosis. 3. At L2-3 there is a mild broad-based disc bulge. Mild bilateral facet arthropathy. Bilateral lateral recess narrowing. 4. At L5-S1 there is a broad-based disc osteophyte complex. Mild bilateral facet arthropathy. Electronically Signed   By: Kathreen Devoid   On: 03/26/2016 14:10   Mr Hip Left Wo Contrast  Result Date: 03/26/2016 CLINICAL  DATA:  Left hip pain.  Avascular necrosis of the left hip. EXAM: MR OF THE LEFT HIP WITHOUT CONTRAST TECHNIQUE: Multiplanar, multisequence MR imaging was performed. No intravenous contrast was administered. COMPARISON:  Radiographs dated 09/18/2014 and and CT scan of the abdomen and pelvis dated 07/22/2006 and FINDINGS: Bones: There is a 2 cm area of dense healed avascular necrosis of superior aspect of the left femoral head. There is adjacent grade 4 chondromalacia of the roof of the acetabulum with a subcortical cysts at that site. Slight marginal  osteophyte formation on the femoral head. Right total hip prosthesis in place. Articular cartilage and labrum Articular cartilage: Focal chondromalacia of the anterior superior aspect of the left acetabulum as described above. Labrum:  Normal. Joint or bursal effusion Joint effusion:  No effusion. Bursae:  No bursitis. Muscles and tendons Muscles and tendons:  Normal. Other findings Miscellaneous:  No adenopathy or mass lesions. IMPRESSION: 1. Healing of an area of previous stage I avascular necrosis of the left femoral head. 2. Focal grade 4 chondromalacia of the adjacent portion of the anterior superior aspect of the left acetabulum. 3. Small marginal osteophytes in the left femoral head. 4. No evidence of active inflammation in the left hip joint. Electronically Signed   By: Lorriane Shire M.D.   On: 03/26/2016 14:52    Assessment & Plan:   [place] was seen today for sinusitis.  Diagnoses and all orders for this visit:  Avascular necrosis of bones of both hips ([place]) -     Ambulatory referral to Orthopedic Surgery -     Discontinue: HYDROcodone-acetaminophen (NORCO) 10-325 MG tablet; Take 1 tablet by mouth every 6 (six) hours as needed. -     Discontinue: HYDROcodone-acetaminophen (NORCO) 10-325 MG tablet; Take 1 tablet by mouth every 6 (six) hours as needed. -     Discontinue: HYDROcodone-acetaminophen (NORCO) 10-325 MG tablet; Take 1 tablet by mouth every 6 (six) hours as needed. -     HYDROcodone-acetaminophen (NORCO) 10-325 MG tablet; Take 1 tablet by mouth every 6 (six) hours as needed.  Acute non-recurrent maxillary sinusitis- I will treat the infection with Omnicef and will control his symptoms with Phenergan DM. -     promethazine-dextromethorphan (PROMETHAZINE-DM) 6.25-15 MG/5ML syrup; Take 5 mLs by mouth 4 (four) times daily as needed for cough. -     cefdinir (OMNICEF) 300 MG capsule; Take 1 capsule (300 mg total) by mouth 2 (two) times daily.  Spinal stenosis of lumbar region  without neurogenic claudication -     Discontinue: HYDROcodone-acetaminophen (NORCO) 10-325 MG tablet; Take 1 tablet by mouth every 6 (six) hours as needed. -     Discontinue: HYDROcodone-acetaminophen (NORCO) 10-325 MG tablet; Take 1 tablet by mouth every 6 (six) hours as needed. -     Discontinue: HYDROcodone-acetaminophen (NORCO) 10-325 MG tablet; Take 1 tablet by mouth every 6 (six) hours as needed. -     HYDROcodone-acetaminophen (NORCO) 10-325 MG tablet; Take 1 tablet by mouth every 6 (six) hours as needed.  GAD (generalized anxiety disorder) -     ALPRAZolam (XANAX) 1 MG tablet; Take 1 tablet (1 mg total) by mouth 3 (three) times daily as needed.   I am having Jonathan [name] start on promethazine-dextromethorphan and cefdinir. I am also having him maintain his dexlansoprazole, ibuprofen, Azelastine-Fluticasone, cetirizine, tamsulosin, ALPRAZolam, and HYDROcodone-acetaminophen.  Meds ordered this encounter  Medications  . promethazine-dextromethorphan (PROMETHAZINE-DM) 6.25-15 MG/5ML syrup    Sig: Take 5 mLs by mouth 4 (four) times daily as  needed for cough.    Dispense:  118 mL    Refill:  0  . cefdinir (OMNICEF) 300 MG capsule    Sig: Take 1 capsule (300 mg total) by mouth 2 (two) times daily.    Dispense:  20 capsule    Refill:  0  . DISCONTD: HYDROcodone-acetaminophen (NORCO) 10-325 MG tablet    Sig: Take 1 tablet by mouth every 6 (six) hours as needed.    Dispense:  100 tablet    Refill:  0    Fill on or after 06/14/16  . ALPRAZolam (XANAX) 1 MG tablet    Sig: Take 1 tablet (1 mg total) by mouth 3 (three) times daily as needed.    Dispense:  90 tablet    Refill:  3    May fill on 06/14/16  . DISCONTD: HYDROcodone-acetaminophen (NORCO) 10-325 MG tablet    Sig: Take 1 tablet by mouth every 6 (six) hours as needed.    Dispense:  100 tablet    Refill:  0    Fill on or after 07/15/16  . DISCONTD: HYDROcodone-acetaminophen (NORCO) 10-325 MG tablet    Sig: Take 1 tablet by  mouth every 6 (six) hours as needed.    Dispense:  100 tablet    Refill:  0    Fill on or after 08/12/16  . HYDROcodone-acetaminophen (NORCO) 10-325 MG tablet    Sig: Take 1 tablet by mouth every 6 (six) hours as needed.    Dispense:  100 tablet    Refill:  0    Fill on or after 09/12/16     Follow-up: No Follow-up on file.  Scarlette Calico, MD

## 2016-06-14 NOTE — Progress Notes (Signed)
Pre visit review using our clinic review tool, if applicable. No additional management support is needed unless otherwise documented below in the visit note. 

## 2016-06-30 ENCOUNTER — Encounter (INDEPENDENT_AMBULATORY_CARE_PROVIDER_SITE_OTHER): Payer: Self-pay | Admitting: Orthopaedic Surgery

## 2016-06-30 ENCOUNTER — Ambulatory Visit (INDEPENDENT_AMBULATORY_CARE_PROVIDER_SITE_OTHER): Payer: Medicare Other

## 2016-06-30 ENCOUNTER — Ambulatory Visit (INDEPENDENT_AMBULATORY_CARE_PROVIDER_SITE_OTHER): Payer: Medicare Other | Admitting: Orthopaedic Surgery

## 2016-06-30 DIAGNOSIS — G8929 Other chronic pain: Secondary | ICD-10-CM

## 2016-06-30 DIAGNOSIS — M25562 Pain in left knee: Secondary | ICD-10-CM | POA: Diagnosis not present

## 2016-06-30 NOTE — Progress Notes (Signed)
Office Visit Note   Patient: Jonathan [name]           Date of Birth: 07-Feb-1956           MRN: [id number] Visit Date: 06/30/2016              Requested by: Janith Lima, MD [address] [place], [place] [address] PCP: Scarlette Calico, MD   Assessment & Plan: Visit Diagnoses:  1. Chronic pain of left knee     Plan: patient has continued 6-7 month history of left knee pain despite conservative treatment. MRI ordered to evaluate for meniscal tear.  Follow-Up Instructions: Return in about 2 weeks (around 07/14/2016).   Orders:  Orders Placed This Encounter  Procedures  . XR KNEE 3 VIEW LEFT  . MR Knee Left w/o contrast   No orders of the defined types were placed in this encounter.     Procedures: No procedures performed   Clinical Data: No additional findings.   Subjective: Chief Complaint  Patient presents with  . Left Knee - Pain, Follow-up    Jonathan [name] is a 61 yo male who presents with a chief complaint of left medial knee pain for the past 6-7 months. He states that one night he was not able to bend his knee and he has been having pain since then. He describes burning and spasms that occur with walking and prolonged sitting. Denies catching, popping, locking, numbness, tingling, swelling, and radiation of pain. He does feel weaker than normal. He has some relief with hydrocodone and no relief with ibuprofen. He has improvement with application of heat and sports cream. Pain is an 8/10 but overall improving. He has been seeing his primary care doctor during this time.    Review of Systems Complete review of systems negative except for history of present illness  Objective: Vital Signs: There were no vitals taken for this visit.  Physical Exam  Constitutional: He is oriented to person, place, and time. He appears well-developed and well-nourished.  HENT:  Head: Normocephalic and atraumatic.  Eyes: Pupils are equal, round, and reactive to  light.  Neck: Neck supple.  Pulmonary/Chest: Effort normal.  Abdominal: Soft.  Musculoskeletal: Normal range of motion.  Neurological: He is alert and oriented to person, place, and time.  Skin: Skin is warm.  Psychiatric: He has a normal mood and affect. His behavior is normal. Judgment and thought content normal.  Nursing note and vitals reviewed.   Ortho Exam Exam of the left knee does not elicit any pain from the hip. He has no joint effusion. He does have medial joint line pain.  Specialty Comments:  No specialty comments available.  Imaging: Xr Knee 3 View Left  Result Date: 06/30/2016 No acute findings    PMFS History: Patient Active Problem List   Diagnosis Date Noted  . Acute non-recurrent maxillary sinusitis 06/14/2016  . Gastroesophageal reflux disease with esophagitis 07/23/2015  . Positive test for herpes simplex virus (HSV) antibody 04/22/2015  . Hyperglycemia 10/13/2014  . History of colonic polyps 10/13/2014  . Avascular necrosis of bones of both hips ([place]) 03/05/2014  . Allergic rhinitis 06/14/2013  . GAD (generalized anxiety disorder) 05/16/2013  . Routine general medical examination at a health care facility 07/14/2011  . Nonspecific abnormal electrocardiogram (ECG) (EKG) 07/14/2011  . BPH with obstruction/lower urinary tract symptoms 05/05/2010  . TOBACCO ABUSE 02/14/2008  . Spinal stenosis of lumbar region 02/14/2008   Past Medical History:  Diagnosis  Date  . Anxiety   . Arthritis   . Back pain, lumbosacral 2000  . Blind right eye   . Colon polyps 08/29/2011   Hyperplastic colon polyps    Family History  Problem Relation Age of Onset  . Stroke Mother   . Arthritis Mother   . Stroke Father   . Hypertension Father   . Cancer Neg Hx   . COPD Neg Hx   . Heart disease Neg Hx   . Kidney disease Neg Hx     Past Surgical History:  Procedure Laterality Date  . EYE SURGERY  2007   Fractured bones repaired/blind in right eye  . HERNIA REPAIR       Right inguinal  . SPINE SURGERY    . TOTAL HIP ARTHROPLASTY Right 09/18/2014   Procedure: RIGHT TOTAL HIP ARTHROPLASTY ANTERIOR APPROACH;  Surgeon: Rod Can, MD;  Location: [facility];  Service: Orthopedics;  Laterality: Right;   Social History   Occupational History  .  Disabled   Social History Main Topics  . Smoking status: Former Smoker    Packs/day: 0.10    Years: 30.00    Types: Cigarettes  . Smokeless tobacco: Never Used     Comment: hsa been quit sincie 08/29/2014   . Alcohol use No  . Drug use: Yes    Types: Marijuana     Comment: Once a month.   . Sexual activity: Not on file

## 2016-07-11 ENCOUNTER — Ambulatory Visit
Admission: RE | Admit: 2016-07-11 | Discharge: 2016-07-11 | Disposition: A | Discharge status: Home / Self Care | Payer: Medicare Other | Source: Ambulatory Visit | Attending: Orthopaedic Surgery | Admitting: Orthopaedic Surgery

## 2016-07-11 DIAGNOSIS — G8929 Other chronic pain: Secondary | ICD-10-CM

## 2016-07-11 DIAGNOSIS — M25562 Pain in left knee: Principal | ICD-10-CM

## 2016-07-14 ENCOUNTER — Ambulatory Visit (INDEPENDENT_AMBULATORY_CARE_PROVIDER_SITE_OTHER): Payer: Medicare Other | Admitting: Orthopaedic Surgery

## 2016-07-27 ENCOUNTER — Encounter: Payer: Self-pay | Admitting: Gastroenterology

## 2016-08-02 ENCOUNTER — Ambulatory Visit (INDEPENDENT_AMBULATORY_CARE_PROVIDER_SITE_OTHER): Payer: Medicare Other | Admitting: Orthopaedic Surgery

## 2016-08-04 ENCOUNTER — Other Ambulatory Visit: Payer: Self-pay | Admitting: Internal Medicine

## 2016-08-04 DIAGNOSIS — K2100 Gastro-esophageal reflux disease with esophagitis, without bleeding: Secondary | ICD-10-CM

## 2016-08-12 ENCOUNTER — Ambulatory Visit (INDEPENDENT_AMBULATORY_CARE_PROVIDER_SITE_OTHER): Payer: Medicare Other | Admitting: Orthopaedic Surgery

## 2016-08-12 ENCOUNTER — Encounter (INDEPENDENT_AMBULATORY_CARE_PROVIDER_SITE_OTHER): Payer: Self-pay | Admitting: Orthopaedic Surgery

## 2016-08-12 DIAGNOSIS — M1712 Unilateral primary osteoarthritis, left knee: Secondary | ICD-10-CM

## 2016-08-12 NOTE — Progress Notes (Signed)
Office Visit Note   Patient: Jonathan [name]           Date of Birth: 04/08/1956           MRN: [id number] Visit Date: 08/12/2016              Requested by: Janith Lima, MD [address] [place], [place] [address] PCP: Scarlette Calico, MD   Assessment & Plan: Visit Diagnoses:  1. Unilateral primary osteoarthritis, left knee     Plan: MRI of the left knee essentially shows mild degenerative changes. Recommend over-the-counter NSAIDs and symptomatic treatment as indicated. Patient does not want a cortisone injection. Follow up with me as needed.  Follow-Up Instructions: Return if symptoms worsen or fail to improve.   Orders:  No orders of the defined types were placed in this encounter.  No orders of the defined types were placed in this encounter.     Procedures: No procedures performed   Clinical Data: No additional findings.   Subjective: Chief Complaint  Patient presents with  . Left Knee - Follow-up    Patient follows up today to review his MRI. He states that his knee feels slightly better. The pain is worse with weather changes and when it's cold outside. He denies any mechanical symptoms.    Review of Systems   Objective: Vital Signs: There were no vitals taken for this visit.  Physical Exam  Ortho Exam Exam of the left knee is stable. Specialty Comments:  No specialty comments available.  Imaging: No results found.   PMFS History: Patient Active Problem List   Diagnosis Date Noted  . Acute non-recurrent maxillary sinusitis 06/14/2016  . Gastroesophageal reflux disease with esophagitis 07/23/2015  . Positive test for herpes simplex virus (HSV) antibody 04/22/2015  . Hyperglycemia 10/13/2014  . History of colonic polyps 10/13/2014  . Avascular necrosis of bones of both hips ([place]) 03/05/2014  . Allergic rhinitis 06/14/2013  . GAD (generalized anxiety disorder) 05/16/2013  . Routine general medical examination at a health  care facility 07/14/2011  . Nonspecific abnormal electrocardiogram (ECG) (EKG) 07/14/2011  . BPH with obstruction/lower urinary tract symptoms 05/05/2010  . TOBACCO ABUSE 02/14/2008  . Spinal stenosis of lumbar region 02/14/2008   Past Medical History:  Diagnosis Date  . Anxiety   . Arthritis   . Back pain, lumbosacral 2000  . Blind right eye   . Colon polyps 08/29/2011   Hyperplastic colon polyps    Family History  Problem Relation Age of Onset  . Stroke Mother   . Arthritis Mother   . Stroke Father   . Hypertension Father   . Cancer Neg Hx   . COPD Neg Hx   . Heart disease Neg Hx   . Kidney disease Neg Hx     Past Surgical History:  Procedure Laterality Date  . EYE SURGERY  2007   Fractured bones repaired/blind in right eye  . HERNIA REPAIR     Right inguinal  . SPINE SURGERY    . TOTAL HIP ARTHROPLASTY Right 09/18/2014   Procedure: RIGHT TOTAL HIP ARTHROPLASTY ANTERIOR APPROACH;  Surgeon: Rod Can, MD;  Location: [facility];  Service: Orthopedics;  Laterality: Right;   Social History   Occupational History  .  Disabled   Social History Main Topics  . Smoking status: Former Smoker    Packs/day: 0.10    Years: 30.00    Types: Cigarettes  . Smokeless tobacco: Never Used  Comment: hsa been quit sincie 08/29/2014   . Alcohol use No  . Drug use: Yes    Types: Marijuana     Comment: Once a month.   . Sexual activity: Not on file

## 2016-09-06 ENCOUNTER — Ambulatory Visit (INDEPENDENT_AMBULATORY_CARE_PROVIDER_SITE_OTHER): Payer: Medicare Other | Admitting: Orthopaedic Surgery

## 2016-09-12 ENCOUNTER — Other Ambulatory Visit: Payer: Self-pay | Admitting: Internal Medicine

## 2016-09-12 DIAGNOSIS — L259 Unspecified contact dermatitis, unspecified cause: Secondary | ICD-10-CM

## 2016-09-20 ENCOUNTER — Telehealth: Payer: Self-pay | Admitting: Internal Medicine

## 2016-09-20 NOTE — Telephone Encounter (Signed)
PA is required. Will update with status once there is a determination.

## 2016-09-20 NOTE — Telephone Encounter (Signed)
States pharmacy faxed over a provider approval for Betamethasone valerate foam.  Requesting call with update.

## 2016-09-21 NOTE — Telephone Encounter (Signed)
PA started via cover my meds.   KEY: [id number]

## 2016-09-22 NOTE — Telephone Encounter (Signed)
Notified they will have a follow up once response is received.

## 2016-09-22 NOTE — Telephone Encounter (Signed)
Can you call pt and inform that PA was approved and to call pharmacy to have them fill the prescription.

## 2016-09-28 ENCOUNTER — Ambulatory Visit: Payer: Medicare Other | Admitting: Internal Medicine

## 2016-09-29 ENCOUNTER — Other Ambulatory Visit (INDEPENDENT_AMBULATORY_CARE_PROVIDER_SITE_OTHER): Payer: Medicare Other

## 2016-09-29 ENCOUNTER — Encounter: Payer: Self-pay | Admitting: Internal Medicine

## 2016-09-29 ENCOUNTER — Ambulatory Visit (INDEPENDENT_AMBULATORY_CARE_PROVIDER_SITE_OTHER): Payer: Medicare Other | Admitting: Internal Medicine

## 2016-09-29 VITALS — BP 114/80 | HR 56 | Temp 97.7°F | Resp 16 | Ht 73.0 in | Wt 190.0 lb

## 2016-09-29 DIAGNOSIS — F411 Generalized anxiety disorder: Secondary | ICD-10-CM | POA: Diagnosis not present

## 2016-09-29 DIAGNOSIS — M87051 Idiopathic aseptic necrosis of right femur: Secondary | ICD-10-CM

## 2016-09-29 DIAGNOSIS — Z0001 Encounter for general adult medical examination with abnormal findings: Secondary | ICD-10-CM | POA: Diagnosis not present

## 2016-09-29 DIAGNOSIS — Z0000 Encounter for general adult medical examination without abnormal findings: Secondary | ICD-10-CM

## 2016-09-29 DIAGNOSIS — J301 Allergic rhinitis due to pollen: Secondary | ICD-10-CM | POA: Diagnosis not present

## 2016-09-29 DIAGNOSIS — M87052 Idiopathic aseptic necrosis of left femur: Secondary | ICD-10-CM | POA: Diagnosis not present

## 2016-09-29 DIAGNOSIS — M48061 Spinal stenosis, lumbar region without neurogenic claudication: Secondary | ICD-10-CM | POA: Diagnosis not present

## 2016-09-29 DIAGNOSIS — N138 Other obstructive and reflux uropathy: Secondary | ICD-10-CM | POA: Diagnosis not present

## 2016-09-29 DIAGNOSIS — N401 Enlarged prostate with lower urinary tract symptoms: Secondary | ICD-10-CM | POA: Diagnosis not present

## 2016-09-29 LAB — COMPREHENSIVE METABOLIC PANEL
ALT: 14 U/L (ref 0–53)
AST: 14 U/L (ref 0–37)
Albumin: 4.7 g/dL (ref 3.5–5.2)
Alkaline Phosphatase: 51 U/L (ref 39–117)
BUN: 9 mg/dL (ref 6–23)
CO2: 29 mEq/L (ref 19–32)
Calcium: 9.4 mg/dL (ref 8.4–10.5)
Chloride: 106 mEq/L (ref 96–112)
Creatinine, Ser: 0.95 mg/dL (ref 0.40–1.50)
GFR: 102.94 mL/min (ref >60.00)
Glucose, Bld: 99 mg/dL (ref 70–99)
Potassium: 4.2 mEq/L (ref 3.5–5.1)
Sodium: 140 mEq/L (ref 135–145)
Total Bilirubin: 0.9 mg/dL (ref 0.2–1.2)
Total Protein: 6.9 g/dL (ref 6.0–8.3)

## 2016-09-29 LAB — LIPID PANEL
Cholesterol: 208 mg/dL — ABNORMAL HIGH (ref 0–200)
HDL: 53.2 mg/dL (ref >39.00)
LDL Cholesterol: 131 mg/dL — ABNORMAL HIGH (ref 0–99)
NonHDL: 154.57
Total CHOL/HDL Ratio: 4
Triglycerides: 120 mg/dL (ref 0.0–149.0)
VLDL: 24 mg/dL (ref 0.0–40.0)

## 2016-09-29 LAB — CBC WITH DIFFERENTIAL/PLATELET
Basophils Absolute: 0 10*3/uL (ref 0.0–0.1)
Basophils Relative: 0.4 % (ref 0.0–3.0)
Eosinophils Absolute: 0 10*3/uL (ref 0.0–0.7)
Eosinophils Relative: 1.6 % (ref 0.0–5.0)
HCT: 46.5 % (ref 39.0–52.0)
Hemoglobin: 15.6 g/dL (ref 13.0–17.0)
Lymphocytes Relative: 51.4 % — ABNORMAL HIGH (ref 12.0–46.0)
Lymphs Abs: 1.5 10*3/uL (ref 0.7–4.0)
MCHC: 33.5 g/dL (ref 30.0–36.0)
MCV: 85.2 fl (ref 78.0–100.0)
Monocytes Absolute: 0.4 10*3/uL (ref 0.1–1.0)
Monocytes Relative: 13 % — ABNORMAL HIGH (ref 3.0–12.0)
Neutro Abs: 1 10*3/uL — ABNORMAL LOW (ref 1.4–7.7)
Neutrophils Relative %: 33.6 % — ABNORMAL LOW (ref 43.0–77.0)
Platelets: 227 10*3/uL (ref 150.0–400.0)
RBC: 5.46 Mil/uL (ref 4.22–5.81)
RDW: 14.5 % (ref 11.5–15.5)
WBC: 3 10*3/uL — ABNORMAL LOW (ref 4.0–10.5)

## 2016-09-29 LAB — URINALYSIS, ROUTINE W REFLEX MICROSCOPIC
Bilirubin Urine: NEGATIVE
Ketones, ur: NEGATIVE
Leukocytes, UA: NEGATIVE
Nitrite: NEGATIVE
Specific Gravity, Urine: 1.015 (ref 1.000–1.030)
Total Protein, Urine: NEGATIVE
Urine Glucose: NEGATIVE
Urobilinogen, UA: 0.2 (ref 0.0–1.0)
WBC, UA: NONE SEEN (ref 0–?)
pH: 6 (ref 5.0–8.0)

## 2016-09-29 LAB — TSH: TSH: 0.75 u[IU]/mL (ref 0.35–4.50)

## 2016-09-29 LAB — PSA: PSA: 3.31 ng/mL (ref 0.10–4.00)

## 2016-09-29 MED ORDER — DUTASTERIDE 0.5 MG PO CAPS: 0.5000 mg | ORAL_CAPSULE | Freq: Every day | ORAL | 3 refills | Status: DC

## 2016-09-29 MED ORDER — TAMSULOSIN HCL 0.4 MG PO CAPS: 0.4000 mg | ORAL_CAPSULE | Freq: Every day | ORAL | 2 refills | Status: DC

## 2016-09-29 MED ORDER — METHYLPREDNISOLONE 4 MG PO TBPK: ORAL_TABLET | ORAL | 0 refills | Status: DC

## 2016-09-29 MED ORDER — LEVOCETIRIZINE DIHYDROCHLORIDE 5 MG PO TABS: 5.0000 mg | ORAL_TABLET | Freq: Every evening | ORAL | 3 refills | Status: DC

## 2016-09-29 MED ORDER — HYDROCODONE-ACETAMINOPHEN 10-325 MG PO TABS: 1.0000 | ORAL_TABLET | Freq: Four times a day (QID) | ORAL | 0 refills | Status: DC | PRN

## 2016-09-29 MED ORDER — ALPRAZOLAM 1 MG PO TABS: 1.0000 mg | ORAL_TABLET | Freq: Three times a day (TID) | ORAL | 3 refills | Status: DC | PRN

## 2016-09-29 NOTE — Patient Instructions (Signed)
 Health Maintenance, Male A healthy lifestyle and preventive care is important for your health and wellness. Ask your health care provider about what schedule of regular examinations is right for you. What should I know about weight and diet?  Eat a Healthy Diet  Eat plenty of vegetables, fruits, whole grains, low-fat dairy products, and lean protein.  Do not eat a lot of foods high in solid fats, added sugars, or salt. Maintain a Healthy Weight  Regular exercise can help you achieve or maintain a healthy weight. You should:  Do at least 150 minutes of exercise each week. The exercise should increase your heart rate and make you sweat (moderate-intensity exercise).  Do strength-training exercises at least twice a week. Watch Your Levels of Cholesterol and Blood Lipids  Have your blood tested for lipids and cholesterol every 5 years starting at 63 years of age. If you are at high risk for heart disease, you should start having your blood tested when you are 63 years old. You may need to have your cholesterol levels checked more often if:  Your lipid or cholesterol levels are high.  You are older than 63 years of age.  You are at high risk for heart disease. What should I know about cancer screening? Many types of cancers can be detected early and may often be prevented. Lung Cancer  You should be screened every year for lung cancer if:  You are a current smoker who has smoked for at least 30 years.  You are a former smoker who has quit within the past 15 years.  Talk to your health care provider about your screening options, when you should start screening, and how often you should be screened. Colorectal Cancer  Routine colorectal cancer screening usually begins at 63 years of age and should be repeated every 5-10 years until you are 63 years old. You may need to be screened more often if early forms of precancerous polyps or small growths are found. Your health care provider  may recommend screening at an earlier age if you have risk factors for colon cancer.  Your health care provider may recommend using home test kits to check for hidden blood in the stool.  A small camera at the end of a tube can be used to examine your colon (sigmoidoscopy or colonoscopy). This checks for the earliest forms of colorectal cancer. Prostate and Testicular Cancer  Depending on your age and overall health, your health care provider may do certain tests to screen for prostate and testicular cancer.  Talk to your health care provider about any symptoms or concerns you have about testicular or prostate cancer. Skin Cancer  Check your skin from head to toe regularly.  Tell your health care provider about any new moles or changes in moles, especially if:  There is a change in a mole's size, shape, or color.  You have a mole that is larger than a pencil eraser.  Always use sunscreen. Apply sunscreen liberally and repeat throughout the day.  Protect yourself by wearing long sleeves, pants, a wide-brimmed hat, and sunglasses when outside. What should I know about heart disease, diabetes, and high blood pressure?  If you are 18-39 years of age, have your blood pressure checked every 3-5 years. If you are 40 years of age or older, have your blood pressure checked every year. You should have your blood pressure measured twice-once when you are at a hospital or clinic, and once when you are not at   a hospital or clinic. Record the average of the two measurements. To check your blood pressure when you are not at a hospital or clinic, you can use:  An automated blood pressure machine at a pharmacy.  A home blood pressure monitor.  Talk to your health care provider about your target blood pressure.  If you are between 45-79 years old, ask your health care provider if you should take aspirin to prevent heart disease.  Have regular diabetes screenings by checking your fasting blood sugar  level.  If you are at a normal weight and have a low risk for diabetes, have this test once every three years after the age of 45.  If you are overweight and have a high risk for diabetes, consider being tested at a younger age or more often.  A one-time screening for abdominal aortic aneurysm (AAA) by ultrasound is recommended for men aged 65-75 years who are current or former smokers. What should I know about preventing infection? Hepatitis B  If you have a higher risk for hepatitis B, you should be screened for this virus. Talk with your health care provider to find out if you are at risk for hepatitis B infection. Hepatitis C  Blood testing is recommended for:  Everyone born from 1945 through 1965.  Anyone with known risk factors for hepatitis C. Sexually Transmitted Diseases (STDs)  You should be screened each year for STDs including gonorrhea and chlamydia if:  You are sexually active and are younger than 63 years of age.  You are older than 63 years of age and your health care provider tells you that you are at risk for this type of infection.  Your sexual activity has changed since you were last screened and you are at an increased risk for chlamydia or gonorrhea. Ask your health care provider if you are at risk.  Talk with your health care provider about whether you are at high risk of being infected with HIV. Your health care provider may recommend a prescription medicine to help prevent HIV infection. What else can I do?  Schedule regular health, dental, and eye exams.  Stay current with your vaccines (immunizations).  Do not use any tobacco products, such as cigarettes, chewing tobacco, and e-cigarettes. If you need help quitting, ask your health care provider.  Limit alcohol intake to no more than 2 drinks per day. One drink equals 12 ounces of beer, 5 ounces of wine, or 1 ounces of hard liquor.  Do not use street drugs.  Do not share needles.  Ask your health  care provider for help if you need support or information about quitting drugs.  Tell your health care provider if you often feel depressed.  Tell your health care provider if you have ever been abused or do not feel safe at home. This information is not intended to replace advice given to you by your health care provider. Make sure you discuss any questions you have with your health care provider. Document Released: 11/12/2007 Document Revised: 01/13/2016 Document Reviewed: 02/17/2015 [employer] Interactive Patient Education  2017 [employer]  

## 2016-09-29 NOTE — Progress Notes (Signed)
Subjective:  Patient ID: Jonathan [name], male    DOB: [birth year]  Age: 61 y.o. MRN: [id number]  CC: Annual Exam; Back Pain; and Osteoarthritis   HPI Mildred L Roher presents for a CPX and medication refills for chronic pain management and chronic anxiety. He tells me his pain and anxiety are well controlled with the current doses of Xanax and hydrocodone. He complains of a persistent weak urinary stream with hesitation and dribbling. He is taking the alpha blocker but has not gotten much improvement. In addition, he complains of a several week history of nasal congestion, runny nose, and postnasal drip. He denies facial pain, sore throat, fever, chills.  Outpatient Medications Prior to Visit  Medication Sig Dispense Refill  . Azelastine-Fluticasone (DYMISTA) 137-50 MCG/ACT SUSP Place 2 Act into the nose 2 (two) times daily. 23 g 11  . Betamethasone Valerate 0.12 % foam APPLY 1 APPLICATION TOPICALLY 2 TIMES DAILY 100 g 2  . DEXILANT 60 MG capsule TAKE 1 CAPSULE (60 MG TOTAL) BY MOUTH DAILY. 30 capsule 11  . ibuprofen (ADVIL,MOTRIN) 800 MG tablet TAKE ONE TABLET BY MOUTH EVERY 8 HOURS AS NEEDED 90 tablet 5  . ALPRAZolam (XANAX) 1 MG tablet Take 1 tablet (1 mg total) by mouth 3 (three) times daily as needed. 90 tablet 3  . cetirizine (ZYRTEC) 10 MG tablet Take 1 tablet (10 mg total) by mouth daily. 30 tablet 11  . HYDROcodone-acetaminophen (NORCO) 10-325 MG tablet Take 1 tablet by mouth every 6 (six) hours as needed. 100 tablet 0  . promethazine-dextromethorphan (PROMETHAZINE-DM) 6.25-15 MG/5ML syrup Take 5 mLs by mouth 4 (four) times daily as needed for cough. 118 mL 0  . tamsulosin (FLOMAX) 0.4 MG CAPS capsule Take 1 capsule (0.4 mg total) by mouth daily. 90 capsule 2   No facility-administered medications prior to visit.     ROS Review of Systems  Constitutional: Negative.  Negative for chills, diaphoresis, fatigue and fever.  HENT: Positive for congestion, postnasal drip and  rhinorrhea. Negative for facial swelling, sinus pain, sinus pressure, sneezing, sore throat and trouble swallowing.   Eyes: Negative.   Respiratory: Negative.  Negative for cough, chest tightness, shortness of breath and wheezing.   Cardiovascular: Negative for chest pain, palpitations and leg swelling.  Gastrointestinal: Negative for abdominal pain, constipation, diarrhea, nausea and vomiting.  Endocrine: Negative.   Genitourinary: Positive for difficulty urinating. Negative for decreased urine volume, discharge, dysuria, enuresis, flank pain, frequency, genital sores, hematuria, penile pain, penile swelling, scrotal swelling, testicular pain and urgency.  Musculoskeletal: Positive for arthralgias and back pain. Negative for gait problem, myalgias and neck pain.  Skin: Negative.  Negative for color change and rash.  Allergic/Immunologic: Negative.   Neurological: Negative.   Hematological: Negative for adenopathy. Does not bruise/bleed easily.  Psychiatric/Behavioral: Negative for decreased concentration, dysphoric mood, sleep disturbance and suicidal ideas. The patient is nervous/anxious.     Objective:  BP 114/80 (BP Location: Left Arm, Patient Position: Sitting, Cuff Size: Normal)   Pulse (!) 56   Temp 97.7 F (36.5 C) (Oral)   Resp 16   Ht 6\' 1"  (1.854 m)   Wt 190 lb (86.2 kg)   SpO2 99%   BMI 25.07 kg/m   BP Readings from Last 3 Encounters:  09/29/16 114/80  06/14/16 130/90  02/25/16 126/88    Wt Readings from Last 3 Encounters:  09/29/16 190 lb (86.2 kg)  06/14/16 189 lb 1.9 oz (85.8 kg)  02/25/16 182 lb 8 oz (82.8 kg)  Physical Exam  Constitutional: He is oriented to person, place, and time. No distress.  HENT:  Right Ear: Hearing, tympanic membrane, external ear and ear canal normal.  Left Ear: Hearing, tympanic membrane, external ear and ear canal normal.  Nose: Mucosal edema and rhinorrhea present. No sinus tenderness. No epistaxis. Right sinus exhibits no  maxillary sinus tenderness and no frontal sinus tenderness. Left sinus exhibits no maxillary sinus tenderness and no frontal sinus tenderness.  Mouth/Throat: Oropharynx is clear and moist. No oropharyngeal exudate.  Eyes: Conjunctivae are normal. Right eye exhibits no discharge. Left eye exhibits no discharge. No scleral icterus.  Neck: Normal range of motion. Neck supple. No JVD present. No tracheal deviation present. No thyromegaly present.  Cardiovascular: Normal rate, regular rhythm, normal heart sounds and intact distal pulses.  Exam reveals no gallop and no friction rub.   No murmur heard. Pulmonary/Chest: Effort normal and breath sounds normal. No stridor. No respiratory distress. He has no wheezes. He has no rales. He exhibits no tenderness.  Abdominal: Soft. Bowel sounds are normal. He exhibits no distension and no mass. There is no tenderness. There is no rebound and no guarding. Hernia confirmed negative in the right inguinal area and confirmed negative in the left inguinal area.  Genitourinary: Rectum normal, testes normal and penis normal. Rectal exam shows no external hemorrhoid, no internal hemorrhoid, no fissure, no mass, no tenderness, anal tone normal and guaiac negative stool. Prostate is enlarged (3+ smooth symm BPH). Prostate is not tender. Right testis shows no mass, no swelling and no tenderness. Right testis is descended. Left testis shows no mass, no swelling and no tenderness. Left testis is descended. Circumcised. No penile erythema or penile tenderness. No discharge found.  Musculoskeletal: Normal range of motion. He exhibits no edema, tenderness or deformity.  Lymphadenopathy:    He has no cervical adenopathy.       Right: No inguinal adenopathy present.       Left: No inguinal adenopathy present.  Neurological: He is alert and oriented to person, place, and time. He has normal reflexes.  Skin: Skin is warm and dry. No rash noted. He is not diaphoretic. No erythema. No  pallor.  Vitals reviewed.   Lab Results  Component Value Date   WBC 3.0 (L) 09/29/2016   HGB 15.6 09/29/2016   HCT 46.5 09/29/2016   PLT 227.0 09/29/2016   GLUCOSE 99 09/29/2016   CHOL 208 (H) 09/29/2016   TRIG 120.0 09/29/2016   HDL 53.20 09/29/2016   LDLDIRECT 131.6 07/14/2011   LDLCALC 131 (H) 09/29/2016   ALT 14 09/29/2016   AST 14 09/29/2016   NA 140 09/29/2016   K 4.2 09/29/2016   CL 106 09/29/2016   CREATININE 0.95 09/29/2016   BUN 9 09/29/2016   CO2 29 09/29/2016   TSH 0.75 09/29/2016   PSA 3.31 09/29/2016   INR 0.97 09/08/2014   HGBA1C 5.2 10/13/2014    Mr Knee Left W/o Contrast  Result Date: 07/11/2016 CLINICAL DATA:  Left knee pain and weakness for 6 months. EXAM: MRI OF THE LEFT KNEE WITHOUT CONTRAST TECHNIQUE: Multiplanar, multisequence MR imaging of the knee was performed. No intravenous contrast was administered. COMPARISON:  Radiographs from 06/30/2016. FINDINGS: MENISCI Medial meniscus: Mild linear grade 2 signal in the posterior horn extends to the periphery but without visible surface extension. Lateral meniscus: Linear signal in the periphery of the lateral meniscus does not extend to a meniscal surface. LIGAMENTS Cruciates:  Unremarkable Collaterals: Mild proximal popliteus tendinopathy. Mild  central irregularity of the MCL is observed and I suspect this is probably the residua of old trauma. No surrounding edema. CARTILAGE Patellofemoral:  Unremarkable Medial: Mild chondral heterogeneity suggesting minimal chondromalacia, but well preserved chondral thickness. Lateral:  Unremarkable Joint:  Unremarkable Popliteal Fossa:  Small Baker's cyst. Extensor Mechanism:  Minimal prepatellar subcutaneous edema. Bones: No significant extra-articular osseous abnormalities identified. Other: No supplemental non-categorized findings. IMPRESSION: 1. Mild proximal popliteus tendinopathy. 2. Suspected old MCL injury with mild irregularity of the central MCL at the joint line. I  doubt that this is acute, there is no surrounding edema. 3. Minimal chondromalacia in the medial compartment but otherwise unusually well preserved articular cartilage for age. 4. Small Baker's cyst. 5. Minimal prepatellar subcutaneous edema. Electronically Signed   By: Van Clines M.D.   On: 07/11/2016 09:20    Assessment & Plan:   Dawn was seen today for annual exam, back pain and osteoarthritis.  Diagnoses and all orders for this visit:  Routine general medical examination at a health care facility- Exam completed, labs ordered and reviewed, will order cologuard to screen for colon cancer/polyps, vaccines reviewed, patient education material was given. -     Lipid panel; Future -     Comprehensive metabolic panel; Future -     CBC with Differential/Platelet; Future -     PSA; Future -     TSH; Future -     Urinalysis, Routine w reflex microscopic; Future  BPH with obstruction/lower urinary tract symptoms- his symptoms are not well-controlled with tamsulosin and his PSA is above 2.5 so I think he would benefit from starting dutasteride. -     tamsulosin (FLOMAX) 0.4 MG CAPS capsule; Take 1 capsule (0.4 mg total) by mouth daily. -     dutasteride (AVODART) 0.5 MG capsule; Take 1 capsule (0.5 mg total) by mouth daily.  Seasonal allergic rhinitis due to pollen -     levocetirizine (XYZAL) 5 MG tablet; Take 1 tablet (5 mg total) by mouth every evening. -     methylPREDNISolone (MEDROL DOSEPAK) 4 MG TBPK tablet; TAKE AS DIRECTED  GAD (generalized anxiety disorder) -     ALPRAZolam (XANAX) 1 MG tablet; Take 1 tablet (1 mg total) by mouth 3 (three) times daily as needed.  Spinal stenosis of lumbar region without neurogenic claudication -     HYDROcodone-acetaminophen (NORCO) 10-325 MG tablet; Take 1 tablet by mouth every 6 (six) hours as needed.  Avascular necrosis of bones of both hips (HCC) -     HYDROcodone-acetaminophen (NORCO) 10-325 MG tablet; Take 1 tablet by mouth every 6  (six) hours as needed.   I have discontinued Mr. Sprung cetirizine and promethazine-dextromethorphan. I am also having him start on dutasteride, levocetirizine, and methylPREDNISolone. Additionally, I am having him maintain his ibuprofen, Azelastine-Fluticasone, DEXILANT, Betamethasone Valerate, tamsulosin, ALPRAZolam, and HYDROcodone-acetaminophen.  Meds ordered this encounter  Medications  . tamsulosin (FLOMAX) 0.4 MG CAPS capsule    Sig: Take 1 capsule (0.4 mg total) by mouth daily.    Dispense:  90 capsule    Refill:  2  . dutasteride (AVODART) 0.5 MG capsule    Sig: Take 1 capsule (0.5 mg total) by mouth daily.    Dispense:  90 capsule    Refill:  3  . levocetirizine (XYZAL) 5 MG tablet    Sig: Take 1 tablet (5 mg total) by mouth every evening.    Dispense:  90 tablet    Refill:  3  . methylPREDNISolone (MEDROL DOSEPAK)  4 MG TBPK tablet    Sig: TAKE AS DIRECTED    Dispense:  21 tablet    Refill:  0  . ALPRAZolam (XANAX) 1 MG tablet    Sig: Take 1 tablet (1 mg total) by mouth 3 (three) times daily as needed.    Dispense:  90 tablet    Refill:  3    May fill on 06/14/16  . HYDROcodone-acetaminophen (NORCO) 10-325 MG tablet    Sig: Take 1 tablet by mouth every 6 (six) hours as needed.    Dispense:  100 tablet    Refill:  0    Fill on or after 09/29/16     Follow-up: Return in about 3 months (around 12/30/2016).  Scarlette Calico, MD

## 2016-09-29 NOTE — Progress Notes (Signed)
Pre visit review using our clinic review tool, if applicable. No additional management support is needed unless otherwise documented below in the visit note. 

## 2016-10-03 ENCOUNTER — Encounter: Payer: Self-pay | Admitting: Internal Medicine

## 2016-10-03 ENCOUNTER — Other Ambulatory Visit: Payer: Self-pay | Admitting: Internal Medicine

## 2016-10-03 ENCOUNTER — Telehealth: Payer: Self-pay

## 2016-10-03 DIAGNOSIS — E785 Hyperlipidemia, unspecified: Secondary | ICD-10-CM | POA: Insufficient documentation

## 2016-10-03 MED ORDER — ATORVASTATIN CALCIUM 20 MG PO TABS: 20.0000 mg | ORAL_TABLET | Freq: Every day | ORAL | 3 refills | Status: DC

## 2016-10-03 NOTE — Telephone Encounter (Signed)
Order [id number]

## 2016-10-04 ENCOUNTER — Other Ambulatory Visit: Payer: Self-pay | Admitting: General Practice

## 2016-10-04 ENCOUNTER — Other Ambulatory Visit: Payer: Self-pay | Admitting: Internal Medicine

## 2016-10-05 ENCOUNTER — Encounter (INDEPENDENT_AMBULATORY_CARE_PROVIDER_SITE_OTHER): Payer: Medicare Other | Admitting: Ophthalmology

## 2016-10-05 DIAGNOSIS — H43812 Vitreous degeneration, left eye: Secondary | ICD-10-CM | POA: Diagnosis not present

## 2016-10-05 DIAGNOSIS — H35033 Hypertensive retinopathy, bilateral: Secondary | ICD-10-CM | POA: Diagnosis not present

## 2016-10-05 DIAGNOSIS — I10 Essential (primary) hypertension: Secondary | ICD-10-CM

## 2016-10-05 DIAGNOSIS — H2513 Age-related nuclear cataract, bilateral: Secondary | ICD-10-CM | POA: Diagnosis not present

## 2016-10-05 DIAGNOSIS — H47211 Primary optic atrophy, right eye: Secondary | ICD-10-CM

## 2016-10-10 ENCOUNTER — Telehealth: Payer: Self-pay | Admitting: Internal Medicine

## 2016-10-10 NOTE — Telephone Encounter (Signed)
Called Jonathan [name] and gave results on the letter from 10/03/2016.   Jonathan [name] requested for me to speak to Curt Bears (Jonathan [name] friend) and explain the results.

## 2016-10-10 NOTE — Telephone Encounter (Signed)
Would like a call back regarding lab results on 5/3

## 2016-10-10 NOTE — Telephone Encounter (Signed)
Noted  

## 2016-10-27 ENCOUNTER — Telehealth: Payer: Self-pay | Admitting: Internal Medicine

## 2016-10-27 LAB — COLOGUARD: Cologuard: NEGATIVE

## 2016-10-27 NOTE — Telephone Encounter (Signed)
Pt would like a refill of HYDROcodone-acetaminophen (NORCO) 10-325 MG tablet    Has an appt 6/12

## 2016-10-31 ENCOUNTER — Other Ambulatory Visit: Payer: Self-pay | Admitting: Internal Medicine

## 2016-10-31 DIAGNOSIS — M87052 Idiopathic aseptic necrosis of left femur: Secondary | ICD-10-CM

## 2016-10-31 DIAGNOSIS — M48061 Spinal stenosis, lumbar region without neurogenic claudication: Secondary | ICD-10-CM

## 2016-10-31 DIAGNOSIS — M87051 Idiopathic aseptic necrosis of right femur: Secondary | ICD-10-CM

## 2016-10-31 MED ORDER — HYDROCODONE-ACETAMINOPHEN 10-325 MG PO TABS: 1.0000 | ORAL_TABLET | Freq: Four times a day (QID) | ORAL | 0 refills | Status: DC | PRN

## 2016-10-31 NOTE — Telephone Encounter (Signed)
Left detailed message that rx is ready for pick up

## 2016-10-31 NOTE — Telephone Encounter (Signed)
RX written 

## 2016-11-08 ENCOUNTER — Ambulatory Visit (INDEPENDENT_AMBULATORY_CARE_PROVIDER_SITE_OTHER): Payer: Medicare Other | Admitting: Internal Medicine

## 2016-11-08 ENCOUNTER — Encounter: Payer: Self-pay | Admitting: Internal Medicine

## 2016-11-08 VITALS — BP 130/80 | HR 75 | Temp 98.4°F | Resp 16 | Ht 73.0 in | Wt 187.0 lb

## 2016-11-08 DIAGNOSIS — M48061 Spinal stenosis, lumbar region without neurogenic claudication: Secondary | ICD-10-CM

## 2016-11-08 DIAGNOSIS — M87051 Idiopathic aseptic necrosis of right femur: Secondary | ICD-10-CM

## 2016-11-08 DIAGNOSIS — M87052 Idiopathic aseptic necrosis of left femur: Secondary | ICD-10-CM | POA: Diagnosis not present

## 2016-11-08 MED ORDER — HYDROCODONE-ACETAMINOPHEN 10-325 MG PO TABS: 1.0000 | ORAL_TABLET | Freq: Four times a day (QID) | ORAL | 0 refills | Status: DC | PRN

## 2016-11-08 NOTE — Progress Notes (Signed)
Subjective:  Patient ID: Jonathan [name], male    DOB: 06-Jun-1955  Age: 61 y.o. MRN: [id number]  CC: Back Pain and Osteoarthritis   HPI Jonathan [name] presents for f/up on chronic pain management. He has chronic low back pain and chronic hip pain related to AVN. He is status post right total hip replacement with a good result and is considering having left hip replacement. His symptoms are controlled with Norco. His back pain started many decades ago after he had a traumatic injury that eventually required a lumbar discectomy. He describes the back pain as an intermittent aching/stabbing sensation that does not radiate into his lower extremities and he has had no recent episodes of numbness/weakness/tingling in his lower extremities.  Outpatient Medications Prior to Visit  Medication Sig Dispense Refill  . ALPRAZolam (XANAX) 1 MG tablet Take 1 tablet (1 mg total) by mouth 3 (three) times daily as needed. 90 tablet 3  . atorvastatin (LIPITOR) 20 MG tablet Take 1 tablet (20 mg total) by mouth daily. 90 tablet 3  . Azelastine-Fluticasone (DYMISTA) 137-50 MCG/ACT SUSP Place 2 Act into the nose 2 (two) times daily. 23 g 11  . Betamethasone Valerate 0.12 % foam APPLY 1 APPLICATION TOPICALLY 2 TIMES DAILY 100 g 2  . DEXILANT 60 MG capsule TAKE 1 CAPSULE (60 MG TOTAL) BY MOUTH DAILY. 30 capsule 11  . dutasteride (AVODART) 0.5 MG capsule Take 1 capsule (0.5 mg total) by mouth daily. 90 capsule 3  . ibuprofen (ADVIL,MOTRIN) 800 MG tablet TAKE ONE TABLET BY MOUTH EVERY 8 HOURS AS NEEDED 90 tablet 5  . levocetirizine (XYZAL) 5 MG tablet Take 1 tablet (5 mg total) by mouth every evening. 90 tablet 3  . tamsulosin (FLOMAX) 0.4 MG CAPS capsule Take 1 capsule (0.4 mg total) by mouth daily. 90 capsule 2  . HYDROcodone-acetaminophen (NORCO) 10-325 MG tablet Take 1 tablet by mouth every 6 (six) hours as needed. 100 tablet 0  . methylPREDNISolone (MEDROL DOSEPAK) 4 MG TBPK tablet TAKE AS DIRECTED 21 tablet 0    No facility-administered medications prior to visit.     ROS Review of Systems  Constitutional: Negative.  Negative for appetite change, diaphoresis, fatigue and unexpected weight change.  HENT: Negative.  Negative for trouble swallowing.   Eyes: Negative for visual disturbance.  Respiratory: Negative.  Negative for apnea, cough, chest tightness, shortness of breath and wheezing.   Cardiovascular: Negative.  Negative for chest pain, palpitations and leg swelling.  Gastrointestinal: Negative for abdominal pain, blood in stool, constipation, diarrhea, nausea and vomiting.  Genitourinary: Negative.  Negative for difficulty urinating.  Musculoskeletal: Positive for arthralgias and back pain. Negative for joint swelling, myalgias and neck pain.  Skin: Negative.   Neurological: Negative.  Negative for dizziness, weakness, light-headedness and numbness.  Hematological: Negative for adenopathy. Does not bruise/bleed easily.  Psychiatric/Behavioral: Negative for confusion, decreased concentration, dysphoric mood, self-injury, sleep disturbance and suicidal ideas. The patient is nervous/anxious.     Objective:  BP 130/80 (BP Location: Left Arm, Patient Position: Sitting, Cuff Size: Large)   Pulse 75   Temp 98.4 F (36.9 C) (Oral)   Resp 16   Ht 6\' 1"  (1.854 m)   Wt 187 lb (84.8 kg)   SpO2 98%   BMI 24.67 kg/m   BP Readings from Last 3 Encounters:  11/08/16 130/80  09/29/16 114/80  06/14/16 130/90    Wt Readings from Last 3 Encounters:  11/08/16 187 lb (84.8 kg)  09/29/16 190 lb (86.2  kg)  06/14/16 189 lb 1.9 oz (85.8 kg)    Physical Exam  Constitutional: He is oriented to person, place, and time. No distress.  HENT:  Mouth/Throat: Oropharynx is clear and moist. No oropharyngeal exudate.  Eyes: Conjunctivae are normal. Right eye exhibits no discharge. Left eye exhibits no discharge. No scleral icterus.  Neck: Normal range of motion. Neck supple. No JVD present. No thyromegaly  present.  Cardiovascular: Normal rate, regular rhythm, normal heart sounds and intact distal pulses.   No murmur heard. Pulmonary/Chest: Effort normal and breath sounds normal. No respiratory distress. He has no wheezes. He has no rales. He exhibits no tenderness.  Abdominal: Soft. Bowel sounds are normal. He exhibits no distension and no mass. There is no tenderness. There is no rebound and no guarding.  Musculoskeletal: Normal range of motion. He exhibits no edema, tenderness or deformity.       Lumbar back: Normal. He exhibits normal range of motion, no tenderness, no bony tenderness, no swelling, no edema, no deformity, no laceration, no pain and no spasm.  Neurological: He is alert and oriented to person, place, and time. He has normal reflexes. He displays no atrophy and no tremor. No cranial nerve deficit or sensory deficit. He exhibits normal muscle tone. He displays no seizure activity. Coordination and gait normal.  Neg SLR in BLE  Skin: Skin is warm and dry. No rash noted. He is not diaphoretic. No erythema. No pallor.  Vitals reviewed.   Lab Results  Component Value Date   WBC 3.0 (L) 09/29/2016   HGB 15.6 09/29/2016   HCT 46.5 09/29/2016   PLT 227.0 09/29/2016   GLUCOSE 99 09/29/2016   CHOL 208 (H) 09/29/2016   TRIG 120.0 09/29/2016   HDL 53.20 09/29/2016   LDLDIRECT 131.6 07/14/2011   LDLCALC 131 (H) 09/29/2016   ALT 14 09/29/2016   AST 14 09/29/2016   NA 140 09/29/2016   K 4.2 09/29/2016   CL 106 09/29/2016   CREATININE 0.95 09/29/2016   BUN 9 09/29/2016   CO2 29 09/29/2016   TSH 0.75 09/29/2016   PSA 3.31 09/29/2016   INR 0.97 09/08/2014   HGBA1C 5.2 10/13/2014    Mr Knee Left W/o Contrast  Result Date: 07/11/2016 CLINICAL DATA:  Left knee pain and weakness for 6 months. EXAM: MRI OF THE LEFT KNEE WITHOUT CONTRAST TECHNIQUE: Multiplanar, multisequence MR imaging of the knee was performed. No intravenous contrast was administered. COMPARISON:  Radiographs from  06/30/2016. FINDINGS: MENISCI Medial meniscus: Mild linear grade 2 signal in the posterior horn extends to the periphery but without visible surface extension. Lateral meniscus: Linear signal in the periphery of the lateral meniscus does not extend to a meniscal surface. LIGAMENTS Cruciates:  Unremarkable Collaterals: Mild proximal popliteus tendinopathy. Mild central irregularity of the MCL is observed and I suspect this is probably the residua of old trauma. No surrounding edema. CARTILAGE Patellofemoral:  Unremarkable Medial: Mild chondral heterogeneity suggesting minimal chondromalacia, but well preserved chondral thickness. Lateral:  Unremarkable Joint:  Unremarkable Popliteal Fossa:  Small Baker's cyst. Extensor Mechanism:  Minimal prepatellar subcutaneous edema. Bones: No significant extra-articular osseous abnormalities identified. Other: No supplemental non-categorized findings. IMPRESSION: 1. Mild proximal popliteus tendinopathy. 2. Suspected old MCL injury with mild irregularity of the central MCL at the joint line. I doubt that this is acute, there is no surrounding edema. 3. Minimal chondromalacia in the medial compartment but otherwise unusually well preserved articular cartilage for age. 4. Small Baker's cyst. 5. Minimal prepatellar subcutaneous  edema. Electronically Signed   By: Van Clines M.D.   On: 07/11/2016 09:20    Assessment & Plan:   Jonathan [name] was seen today for back pain and osteoarthritis.  Diagnoses and all orders for this visit:  Spinal stenosis of lumbar region without neurogenic claudication- UDS collected today, [place] controlled substances database checked, will continue Norco at the current dose. -     Discontinue: HYDROcodone-acetaminophen (NORCO) 10-325 MG tablet; Take 1 tablet by mouth every 6 (six) hours as needed. -     Discontinue: HYDROcodone-acetaminophen (NORCO) 10-325 MG tablet; Take 1 tablet by mouth every 6 (six) hours as needed. -      HYDROcodone-acetaminophen (NORCO) 10-325 MG tablet; Take 1 tablet by mouth every 6 (six) hours as needed.  Avascular necrosis of bones of both hips ([place])- as above -     Discontinue: HYDROcodone-acetaminophen (NORCO) 10-325 MG tablet; Take 1 tablet by mouth every 6 (six) hours as needed. -     Discontinue: HYDROcodone-acetaminophen (NORCO) 10-325 MG tablet; Take 1 tablet by mouth every 6 (six) hours as needed. -     HYDROcodone-acetaminophen (NORCO) 10-325 MG tablet; Take 1 tablet by mouth every 6 (six) hours as needed.   I have discontinued Jonathan [name] methylPREDNISolone. I am also having him maintain his Azelastine-Fluticasone, DEXILANT, Betamethasone Valerate, tamsulosin, dutasteride, levocetirizine, ALPRAZolam, atorvastatin, ibuprofen, and HYDROcodone-acetaminophen.  Meds ordered this encounter  Medications  . DISCONTD: HYDROcodone-acetaminophen (NORCO) 10-325 MG tablet    Sig: Take 1 tablet by mouth every 6 (six) hours as needed.    Dispense:  100 tablet    Refill:  0    Fill on or after 11/30/16  . DISCONTD: HYDROcodone-acetaminophen (NORCO) 10-325 MG tablet    Sig: Take 1 tablet by mouth every 6 (six) hours as needed.    Dispense:  100 tablet    Refill:  0    Fill on or after 12/31/16  . HYDROcodone-acetaminophen (NORCO) 10-325 MG tablet    Sig: Take 1 tablet by mouth every 6 (six) hours as needed.    Dispense:  100 tablet    Refill:  0    Fill on or after 01/31/17     Follow-up: Return in about 4 months (around 03/10/2017).  Scarlette Calico, MD

## 2016-11-08 NOTE — Patient Instructions (Signed)

## 2016-11-09 ENCOUNTER — Encounter: Payer: Self-pay | Admitting: Internal Medicine

## 2016-11-09 NOTE — Telephone Encounter (Signed)
Result was negative.

## 2016-11-14 ENCOUNTER — Other Ambulatory Visit: Payer: Self-pay | Admitting: Internal Medicine

## 2016-11-15 ENCOUNTER — Telehealth: Payer: Self-pay

## 2016-11-15 NOTE — Telephone Encounter (Signed)
[place] toxicology came back negative. PCP noted that xanax rx has been discontinued an will not be refilled.

## 2017-01-31 DIAGNOSIS — Z96641 Presence of right artificial hip joint: Secondary | ICD-10-CM | POA: Diagnosis not present

## 2017-01-31 DIAGNOSIS — Z471 Aftercare following joint replacement surgery: Secondary | ICD-10-CM | POA: Diagnosis not present

## 2017-01-31 DIAGNOSIS — M87052 Idiopathic aseptic necrosis of left femur: Secondary | ICD-10-CM | POA: Diagnosis not present

## 2017-02-21 ENCOUNTER — Ambulatory Visit (INDEPENDENT_AMBULATORY_CARE_PROVIDER_SITE_OTHER): Payer: Medicare Other | Admitting: Internal Medicine

## 2017-02-21 ENCOUNTER — Telehealth: Payer: Self-pay | Admitting: Internal Medicine

## 2017-02-21 ENCOUNTER — Other Ambulatory Visit: Payer: Medicare Other

## 2017-02-21 ENCOUNTER — Encounter: Payer: Self-pay | Admitting: Internal Medicine

## 2017-02-21 VITALS — BP 140/80 | HR 76 | Temp 97.6°F | Resp 16 | Ht 73.0 in | Wt 181.0 lb

## 2017-02-21 DIAGNOSIS — M87052 Idiopathic aseptic necrosis of left femur: Secondary | ICD-10-CM

## 2017-02-21 DIAGNOSIS — M48061 Spinal stenosis, lumbar region without neurogenic claudication: Secondary | ICD-10-CM | POA: Diagnosis not present

## 2017-02-21 DIAGNOSIS — M87051 Idiopathic aseptic necrosis of right femur: Secondary | ICD-10-CM

## 2017-02-21 DIAGNOSIS — Z79891 Long term (current) use of opiate analgesic: Secondary | ICD-10-CM

## 2017-02-21 MED ORDER — HYDROCODONE-ACETAMINOPHEN 10-325 MG PO TABS: 1.0000 | ORAL_TABLET | Freq: Four times a day (QID) | ORAL | 0 refills | Status: DC | PRN

## 2017-02-21 NOTE — Patient Instructions (Signed)

## 2017-02-22 ENCOUNTER — Other Ambulatory Visit: Payer: Self-pay | Admitting: Internal Medicine

## 2017-02-22 DIAGNOSIS — L259 Unspecified contact dermatitis, unspecified cause: Secondary | ICD-10-CM

## 2017-02-23 NOTE — Progress Notes (Signed)
Subjective:  Patient ID: Jonathan [name], male    DOB: 1955-10-17  Age: 61 y.o. MRN: [id number]  CC: Osteoarthritis and Back Pain   HPI Kaynen L Fleece presents for f/up - he complains of worsening pain in his low back and hip. He is having total hip replacement soon. He wants permssion to take norco up to 4-5 times a day for pain relief.  The LBP occasionally radiates into his left thigh but he denies N/W/T in his LE's.  He is not willing to consider having another low back surgery  Outpatient Medications Prior to Visit  Medication Sig Dispense Refill  . atorvastatin (LIPITOR) 20 MG tablet Take 1 tablet (20 mg total) by mouth daily. 90 tablet 3  . Azelastine-Fluticasone (DYMISTA) 137-50 MCG/ACT SUSP Place 2 Act into the nose 2 (two) times daily. 23 g 11  . DEXILANT 60 MG capsule TAKE 1 CAPSULE (60 MG TOTAL) BY MOUTH DAILY. 30 capsule 11  . dutasteride (AVODART) 0.5 MG capsule Take 1 capsule (0.5 mg total) by mouth daily. 90 capsule 3  . ibuprofen (ADVIL,MOTRIN) 800 MG tablet TAKE ONE TABLET BY MOUTH EVERY 8 HOURS AS NEEDED 90 tablet 5  . levocetirizine (XYZAL) 5 MG tablet Take 1 tablet (5 mg total) by mouth every evening. 90 tablet 3  . tamsulosin (FLOMAX) 0.4 MG CAPS capsule Take 1 capsule (0.4 mg total) by mouth daily. 90 capsule 2  . Betamethasone Valerate 0.12 % foam APPLY 1 APPLICATION TOPICALLY 2 TIMES DAILY 100 g 2  . HYDROcodone-acetaminophen (NORCO) 10-325 MG tablet Take 1 tablet by mouth every 6 (six) hours as needed. 100 tablet 0   No facility-administered medications prior to visit.     ROS Review of Systems  Constitutional: Negative.  Negative for appetite change, diaphoresis, fatigue and unexpected weight change.  HENT: Negative.   Eyes: Negative.   Respiratory: Negative.  Negative for chest tightness, shortness of breath and wheezing.   Cardiovascular: Negative for chest pain, palpitations and leg swelling.  Gastrointestinal: Negative for abdominal pain,  constipation, diarrhea, nausea and vomiting.  Endocrine: Negative.   Genitourinary: Negative.  Negative for difficulty urinating.  Musculoskeletal: Positive for arthralgias and back pain. Negative for myalgias and neck pain.  Skin: Negative.  Negative for color change and rash.  Allergic/Immunologic: Negative.   Neurological: Negative.  Negative for dizziness and numbness.  Hematological: Negative for adenopathy. Does not bruise/bleed easily.  Psychiatric/Behavioral: Negative.     Objective:  BP 140/80 (BP Location: Left Arm, Patient Position: Sitting, Cuff Size: Normal)   Pulse 76   Temp 97.6 F (36.4 C) (Oral)   Resp 16   Ht 6\' 1"  (1.854 m)   Wt 181 lb (82.1 kg)   SpO2 100%   BMI 23.88 kg/m   BP Readings from Last 3 Encounters:  02/21/17 140/80  11/08/16 130/80  09/29/16 114/80    Wt Readings from Last 3 Encounters:  02/21/17 181 lb (82.1 kg)  11/08/16 187 lb (84.8 kg)  09/29/16 190 lb (86.2 kg)    Physical Exam  Constitutional: He is oriented to person, place, and time. No distress.  HENT:  Mouth/Throat: Oropharynx is clear and moist. No oropharyngeal exudate.  Eyes: Conjunctivae are normal. Right eye exhibits no discharge. Left eye exhibits no discharge. No scleral icterus.  Neck: Normal range of motion. Neck supple. No JVD present. No thyromegaly present.  Cardiovascular: Normal rate, regular rhythm and intact distal pulses.  Exam reveals no gallop and no friction rub.  No murmur heard. Pulmonary/Chest: Effort normal and breath sounds normal. No respiratory distress. He has no wheezes. He has no rales. He exhibits no tenderness.  Abdominal: Soft. Bowel sounds are normal. He exhibits no distension and no mass. There is no tenderness. There is no rebound and no guarding.  Musculoskeletal: Normal range of motion. He exhibits no edema, tenderness or deformity.  Lymphadenopathy:    He has no cervical adenopathy.  Neurological: He is alert and oriented to person,  place, and time. He has normal strength and normal reflexes. He displays no atrophy, no tremor and normal reflexes. No cranial nerve deficit or sensory deficit. He exhibits normal muscle tone. He displays a negative Romberg sign. He displays no seizure activity. Coordination and gait normal. He displays no Babinski's sign on the right side. He displays no Babinski's sign on the left side.  Neg SLR in BLE  Skin: Skin is warm and dry. No rash noted. He is not diaphoretic. No erythema. No pallor.  Vitals reviewed.   Lab Results  Component Value Date   WBC 3.0 (L) 09/29/2016   HGB 15.6 09/29/2016   HCT 46.5 09/29/2016   PLT 227.0 09/29/2016   GLUCOSE 99 09/29/2016   CHOL 208 (H) 09/29/2016   TRIG 120.0 09/29/2016   HDL 53.20 09/29/2016   LDLDIRECT 131.6 07/14/2011   LDLCALC 131 (H) 09/29/2016   ALT 14 09/29/2016   AST 14 09/29/2016   NA 140 09/29/2016   K 4.2 09/29/2016   CL 106 09/29/2016   CREATININE 0.95 09/29/2016   BUN 9 09/29/2016   CO2 29 09/29/2016   TSH 0.75 09/29/2016   PSA 3.31 09/29/2016   INR 0.97 09/08/2014   HGBA1C 5.2 10/13/2014    Mr Knee Left W/o Contrast  Result Date: 07/11/2016 CLINICAL DATA:  Left knee pain and weakness for 6 months. EXAM: MRI OF THE LEFT KNEE WITHOUT CONTRAST TECHNIQUE: Multiplanar, multisequence MR imaging of the knee was performed. No intravenous contrast was administered. COMPARISON:  Radiographs from 06/30/2016. FINDINGS: MENISCI Medial meniscus: Mild linear grade 2 signal in the posterior horn extends to the periphery but without visible surface extension. Lateral meniscus: Linear signal in the periphery of the lateral meniscus does not extend to a meniscal surface. LIGAMENTS Cruciates:  Unremarkable Collaterals: Mild proximal popliteus tendinopathy. Mild central irregularity of the MCL is observed and I suspect this is probably the residua of old trauma. No surrounding edema. CARTILAGE Patellofemoral:  Unremarkable Medial: Mild chondral  heterogeneity suggesting minimal chondromalacia, but well preserved chondral thickness. Lateral:  Unremarkable Joint:  Unremarkable Popliteal Fossa:  Small Baker's cyst. Extensor Mechanism:  Minimal prepatellar subcutaneous edema. Bones: No significant extra-articular osseous abnormalities identified. Other: No supplemental non-categorized findings. IMPRESSION: 1. Mild proximal popliteus tendinopathy. 2. Suspected old MCL injury with mild irregularity of the central MCL at the joint line. I doubt that this is acute, there is no surrounding edema. 3. Minimal chondromalacia in the medial compartment but otherwise unusually well preserved articular cartilage for age. 4. Small Baker's cyst. 5. Minimal prepatellar subcutaneous edema. Electronically Signed   By: Van Clines M.D.   On: 07/11/2016 09:20    Assessment & Plan:   Bradrick was seen today for osteoarthritis and back pain.  Diagnoses and all orders for this visit:  Encounter for long-term opiate analgesic use-  Will screen for compliance with opiate therapy and will screen for substance abuse. -     Pain Mgmt, Profile 8 w/Conf, U; Future  Spinal stenosis of lumbar region  without neurogenic claudication-  Will increase th dose of Norco, allowing him to take up to 4-5 a day. -     Discontinue: HYDROcodone-acetaminophen (NORCO) 10-325 MG tablet; Take 1 tablet by mouth every 6 (six) hours as needed. -     Discontinue: HYDROcodone-acetaminophen (NORCO) 10-325 MG tablet; Take 1 tablet by mouth every 6 (six) hours as needed. -     HYDROcodone-acetaminophen (NORCO) 10-325 MG tablet; Take 1 tablet by mouth every 6 (six) hours as needed.  Avascular necrosis of bones of both hips (HCC) -     Discontinue: HYDROcodone-acetaminophen (NORCO) 10-325 MG tablet; Take 1 tablet by mouth every 6 (six) hours as needed. -     Discontinue: HYDROcodone-acetaminophen (NORCO) 10-325 MG tablet; Take 1 tablet by mouth every 6 (six) hours as needed. -      HYDROcodone-acetaminophen (NORCO) 10-325 MG tablet; Take 1 tablet by mouth every 6 (six) hours as needed.   I am having Mr. Gee maintain his Azelastine-Fluticasone, DEXILANT, tamsulosin, dutasteride, levocetirizine, atorvastatin, ibuprofen, and HYDROcodone-acetaminophen.  Meds ordered this encounter  Medications  . DISCONTD: HYDROcodone-acetaminophen (NORCO) 10-325 MG tablet    Sig: Take 1 tablet by mouth every 6 (six) hours as needed.    Dispense:  100 tablet    Refill:  0    Fill on or after 02/21/17  . DISCONTD: HYDROcodone-acetaminophen (NORCO) 10-325 MG tablet    Sig: Take 1 tablet by mouth every 6 (six) hours as needed.    Dispense:  120 tablet    Refill:  0    Fill on or after 02/21/17  . HYDROcodone-acetaminophen (NORCO) 10-325 MG tablet    Sig: Take 1 tablet by mouth every 6 (six) hours as needed.    Dispense:  120 tablet    Refill:  0    Fill on or after 03/23/17     Follow-up: Return in about 3 months (around 05/23/2017).  Scarlette Calico, MD

## 2017-02-25 LAB — PAIN MGMT, PROFILE 8 W/CONF, U
6 Acetylmorphine: NEGATIVE ng/mL (ref <10)
Alcohol Metabolites: NEGATIVE ng/mL (ref <500)
Alphahydroxyalprazolam: NEGATIVE ng/mL (ref <25)
Alphahydroxymidazolam: NEGATIVE ng/mL (ref <50)
Alphahydroxytriazolam: NEGATIVE ng/mL (ref <50)
Aminoclonazepam: NEGATIVE ng/mL (ref <25)
Amphetamines: NEGATIVE ng/mL (ref <500)
Benzodiazepines: NEGATIVE ng/mL (ref <100)
Buprenorphine, Urine: NEGATIVE ng/mL (ref <5)
Cocaine Metabolite: NEGATIVE ng/mL (ref <150)
Codeine: NEGATIVE ng/mL (ref <50)
Creatinine: 104.8 mg/dL
Hydrocodone: 1092 ng/mL — ABNORMAL HIGH (ref <50)
Hydromorphone: 160 ng/mL — ABNORMAL HIGH (ref <50)
Hydroxyethylflurazepam: NEGATIVE ng/mL (ref <50)
Lorazepam: NEGATIVE ng/mL (ref <50)
MDMA: NEGATIVE ng/mL (ref <500)
Marijuana Metabolite: 183 ng/mL — ABNORMAL HIGH (ref <5)
Marijuana Metabolite: POSITIVE ng/mL — AB (ref <20)
Morphine: NEGATIVE ng/mL (ref <50)
Nordiazepam: NEGATIVE ng/mL (ref <50)
Norhydrocodone: 791 ng/mL — ABNORMAL HIGH (ref <50)
Opiates: POSITIVE ng/mL — AB (ref <100)
Oxazepam: NEGATIVE ng/mL (ref <50)
Oxidant: NEGATIVE ug/mL (ref <200)
Oxycodone: NEGATIVE ng/mL (ref <100)
Temazepam: NEGATIVE ng/mL (ref <50)
pH: 5.71 (ref 4.5–9.0)

## 2017-02-28 NOTE — Telephone Encounter (Signed)
Xanax was discontinued due to negative toxicology screen.

## 2017-02-28 NOTE — Telephone Encounter (Signed)
Pt called requesting a refill on this medication.

## 2017-03-03 ENCOUNTER — Ambulatory Visit: Payer: Self-pay | Admitting: Orthopedic Surgery

## 2017-03-20 NOTE — Telephone Encounter (Signed)
Please confirm xanax dc'ed due to negative toxicology screening.

## 2017-03-20 NOTE — Telephone Encounter (Addendum)
Patient informed that xanax was dc'ed. Informed why - due to toxicology screenings.  Patient stated understanding.

## 2017-03-20 NOTE — Telephone Encounter (Signed)
Pt called for a refill of this, please call back in regard

## 2017-03-20 NOTE — Telephone Encounter (Signed)
Yes, xanax has been discontinued

## 2017-03-21 ENCOUNTER — Ambulatory Visit: Payer: Self-pay | Admitting: Orthopedic Surgery

## 2017-03-21 NOTE — H&P (View-Only) (Signed)
TOTAL HIP ADMISSION H&P  Patient is admitted for left total hip arthroplasty.  Subjective:  Chief Complaint: left hip pain  HPI: Jonathan [name], 61 y.o. male, has a history of pain and functional disability in the left hip(s) due to AVN and patient has failed non-surgical conservative treatments for greater than 12 weeks to include NSAID's and/or analgesics, flexibility and strengthening excercises, use of assistive devices, weight reduction as appropriate and activity modification.  Onset of symptoms was gradual starting 2 years ago with rapidlly worsening course since that time.The patient noted no past surgery on the left hip(s).  Patient currently rates pain in the left hip at 10 out of 10 with activity. Patient has night pain, worsening of pain with activity and weight bearing, trendelenberg gait, pain that interfers with activities of daily living and pain with passive range of motion. Patient has evidence of AVN with collapse by imaging studies. This condition presents safety issues increasing the risk of falls. This patient has had avascular necrosis of the hip.  There is no current active infection.  Patient Active Problem List   Diagnosis Date Noted  . Encounter for long-term opiate analgesic use 02/21/2017  . Dyslipidemia, goal LDL below 100 10/03/2016  . Gastroesophageal reflux disease with esophagitis 07/23/2015  . Positive test for herpes simplex virus (HSV) antibody 04/22/2015  . Hyperglycemia 10/13/2014  . History of colonic polyps 10/13/2014  . Avascular necrosis of bones of both hips ([place]) 03/05/2014  . Allergic rhinitis 06/14/2013  . GAD (generalized anxiety disorder) 05/16/2013  . Routine general medical examination at a health care facility 07/14/2011  . Nonspecific abnormal electrocardiogram (ECG) (EKG) 07/14/2011  . BPH with obstruction/lower urinary tract symptoms 05/05/2010  . TOBACCO ABUSE 02/14/2008  . Spinal stenosis of lumbar region 02/14/2008   Past Medical  History:  Diagnosis Date  . Anxiety   . Arthritis   . Back pain, lumbosacral 2000  . Blind right eye   . Colon polyps 08/29/2011   Hyperplastic colon polyps    Past Surgical History:  Procedure Laterality Date  . EYE SURGERY  2007   Fractured bones repaired/blind in right eye  . HERNIA REPAIR     Right inguinal  . SPINE SURGERY    . TOTAL HIP ARTHROPLASTY Right 09/18/2014   Procedure: RIGHT TOTAL HIP ARTHROPLASTY ANTERIOR APPROACH;  Surgeon: Rod Can, MD;  Location: [facility];  Service: Orthopedics;  Laterality: Right;    Current Outpatient Prescriptions  Medication Sig Dispense Refill Last Dose  . atorvastatin (LIPITOR) 20 MG tablet Take 1 tablet (20 mg total) by mouth daily. 90 tablet 3 Taking  . Azelastine-Fluticasone (DYMISTA) 137-50 MCG/ACT SUSP Place 2 Act into the nose 2 (two) times daily. 23 g 11 Taking  . Betamethasone Valerate 0.12 % foam APPLY TOPICALLY TO AFFECTED AREA(S) TWICE DAILY 100 g 2   . DEXILANT 60 MG capsule TAKE 1 CAPSULE (60 MG TOTAL) BY MOUTH DAILY. 30 capsule 11 Taking  . dutasteride (AVODART) 0.5 MG capsule Take 1 capsule (0.5 mg total) by mouth daily. 90 capsule 3 Taking  . HYDROcodone-acetaminophen (NORCO) 10-325 MG tablet Take 1 tablet by mouth every 6 (six) hours as needed. 120 tablet 0   . ibuprofen (ADVIL,MOTRIN) 800 MG tablet TAKE ONE TABLET BY MOUTH EVERY 8 HOURS AS NEEDED 90 tablet 5 Taking  . levocetirizine (XYZAL) 5 MG tablet Take 1 tablet (5 mg total) by mouth every evening. 90 tablet 3 Taking  . tamsulosin (FLOMAX) 0.4 MG CAPS capsule Take 1  capsule (0.4 mg total) by mouth daily. 90 capsule 2 Taking   No current facility-administered medications for this visit.    Allergies  Allergen Reactions  . Cortisone     REACTION: rash, "act different"    Social History  Substance Use Topics  . Smoking status: Former Smoker    Packs/day: 0.10    Years: 30.00    Types: Cigarettes  . Smokeless tobacco: Never Used     Comment: hsa been quit  s[place]ie 08/29/2014   . Alcohol use No    Family History  Problem Relation Age of Onset  . Stroke Mother   . Arthritis Mother   . Stroke Father   . Hypertension Father   . Cancer Neg Hx   . COPD Neg Hx   . Heart disease Neg Hx   . Kidney disease Neg Hx      Review of Systems  Constitutional: Negative.   HENT: Negative.   Eyes: Negative.   Respiratory: Negative.   Cardiovascular: Negative.   Gastrointestinal: Negative.   Genitourinary: Positive for frequency.  Musculoskeletal: Positive for back pain and joint pain.  Skin: Negative.   Neurological: Negative.   Endo/Heme/Allergies: Negative.   Psychiatric/Behavioral: Negative.     Objective:  Physical Exam  Vitals reviewed. Constitutional: He is oriented to person, place, and time. He appears well-developed and well-nourished.  HENT:  Head: Normocephalic and atraumatic.  Eyes: Pupils are equal, round, and reactive to light. Conjunctivae and EOM are normal.  Neck: Normal range of motion. Neck supple.  Cardiovascular: Normal rate and intact distal pulses.   Respiratory: Effort normal. No respiratory distress.  GI: Soft. He exhibits no distension.  Genitourinary:  Genitourinary Comments: deferred  Musculoskeletal:       Left hip: He exhibits decreased range of motion and decreased strength.  Neurological: He is alert and oriented to person, place, and time. He has normal reflexes.  Skin: Skin is warm and dry.  Psychiatric: He has a normal mood and affect. His behavior is normal. Judgment and thought content normal.    Vital signs in last 24 hours: @VSRANGES @  Labs:   Estimated body mass index is 23.88 kg/m as calculated from the following:   Height as of 02/21/17: 6\' 1"  (1.854 m).   Weight as of 02/21/17: 82.1 kg (181 lb).   Imaging Review Plain radiographs demonstrate moderate degenerative joint disease of the left hip(s). The bone quality appears to be adequate for age and reported activity  level.  Assessment/Plan:  End stage arthritis, left hip(s) with AVN  The patient history, physical examination, clinical judgement of the provider and imaging studies are consistent with end stage degenerative joint disease of the left hip(s) and total hip arthroplasty is deemed medically necessary. The treatment options including medical management, injection therapy, arthroscopy and arthroplasty were discussed at length. The risks and benefits of total hip arthroplasty were presented and reviewed. The risks due to aseptic loosening, infection, stiffness, dislocation/subluxation,  thromboembolic complications and other imponderables were discussed.  The patient acknowledged the explanation, agreed to proceed with the plan and consent was signed. Patient is being admitted for inpatient treatment for surgery, pain control, PT, OT, prophylactic antibiotics, VTE prophylaxis, progressive ambulation and ADL's and discharge planning.The patient is planning to be discharged HEP home with HEP

## 2017-03-21 NOTE — H&P (Signed)
TOTAL HIP ADMISSION H&P  Patient is admitted for left total hip arthroplasty.  Subjective:  Chief Complaint: left hip pain  HPI: Jonathan [name], 61 y.o. male, has a history of pain and functional disability in the left hip(s) due to AVN and patient has failed non-surgical conservative treatments for greater than 12 weeks to include NSAID's and/or analgesics, flexibility and strengthening excercises, use of assistive devices, weight reduction as appropriate and activity modification.  Onset of symptoms was gradual starting 2 years ago with rapidlly worsening course since that time.The patient noted no past surgery on the left hip(s).  Patient currently rates pain in the left hip at 10 out of 10 with activity. Patient has night pain, worsening of pain with activity and weight bearing, trendelenberg gait, pain that interfers with activities of daily living and pain with passive range of motion. Patient has evidence of AVN with collapse by imaging studies. This condition presents safety issues increasing the risk of falls. This patient has had avascular necrosis of the hip.  There is no current active infection.  Patient Active Problem List   Diagnosis Date Noted  . Encounter for long-term opiate analgesic use 02/21/2017  . Dyslipidemia, goal LDL below 100 10/03/2016  . Gastroesophageal reflux disease with esophagitis 07/23/2015  . Positive test for herpes simplex virus (HSV) antibody 04/22/2015  . Hyperglycemia 10/13/2014  . History of colonic polyps 10/13/2014  . Avascular necrosis of bones of both hips ([place]) 03/05/2014  . Allergic rhinitis 06/14/2013  . GAD (generalized anxiety disorder) 05/16/2013  . Routine general medical examination at a health care facility 07/14/2011  . Nonspecific abnormal electrocardiogram (ECG) (EKG) 07/14/2011  . BPH with obstruction/lower urinary tract symptoms 05/05/2010  . TOBACCO ABUSE 02/14/2008  . Spinal stenosis of lumbar region 02/14/2008   Past Medical  History:  Diagnosis Date  . Anxiety   . Arthritis   . Back pain, lumbosacral 2000  . Blind right eye   . Colon polyps 08/29/2011   Hyperplastic colon polyps    Past Surgical History:  Procedure Laterality Date  . EYE SURGERY  2007   Fractured bones repaired/blind in right eye  . HERNIA REPAIR     Right inguinal  . SPINE SURGERY    . TOTAL HIP ARTHROPLASTY Right 09/18/2014   Procedure: RIGHT TOTAL HIP ARTHROPLASTY ANTERIOR APPROACH;  Surgeon: Rod Can, MD;  Location: [facility];  Service: Orthopedics;  Laterality: Right;    Current Outpatient Prescriptions  Medication Sig Dispense Refill Last Dose  . atorvastatin (LIPITOR) 20 MG tablet Take 1 tablet (20 mg total) by mouth daily. 90 tablet 3 Taking  . Azelastine-Fluticasone (DYMISTA) 137-50 MCG/ACT SUSP Place 2 Act into the nose 2 (two) times daily. 23 g 11 Taking  . Betamethasone Valerate 0.12 % foam APPLY TOPICALLY TO AFFECTED AREA(S) TWICE DAILY 100 g 2   . DEXILANT 60 MG capsule TAKE 1 CAPSULE (60 MG TOTAL) BY MOUTH DAILY. 30 capsule 11 Taking  . dutasteride (AVODART) 0.5 MG capsule Take 1 capsule (0.5 mg total) by mouth daily. 90 capsule 3 Taking  . HYDROcodone-acetaminophen (NORCO) 10-325 MG tablet Take 1 tablet by mouth every 6 (six) hours as needed. 120 tablet 0   . ibuprofen (ADVIL,MOTRIN) 800 MG tablet TAKE ONE TABLET BY MOUTH EVERY 8 HOURS AS NEEDED 90 tablet 5 Taking  . levocetirizine (XYZAL) 5 MG tablet Take 1 tablet (5 mg total) by mouth every evening. 90 tablet 3 Taking  . tamsulosin (FLOMAX) 0.4 MG CAPS capsule Take 1  capsule (0.4 mg total) by mouth daily. 90 capsule 2 Taking   No current facility-administered medications for this visit.    Allergies  Allergen Reactions  . Cortisone     REACTION: rash, "act different"    Social History  Substance Use Topics  . Smoking status: Former Smoker    Packs/day: 0.10    Years: 30.00    Types: Cigarettes  . Smokeless tobacco: Never Used     Comment: hsa been quit  [place] 08/29/2014   . Alcohol use No    Family History  Problem Relation Age of Onset  . Stroke Mother   . Arthritis Mother   . Stroke Father   . Hypertension Father   . Cancer Neg Hx   . COPD Neg Hx   . Heart disease Neg Hx   . Kidney disease Neg Hx      Review of Systems  Constitutional: Negative.   HENT: Negative.   Eyes: Negative.   Respiratory: Negative.   Cardiovascular: Negative.   Gastrointestinal: Negative.   Genitourinary: Positive for frequency.  Musculoskeletal: Positive for back pain and joint pain.  Skin: Negative.   Neurological: Negative.   Endo/Heme/Allergies: Negative.   Psychiatric/Behavioral: Negative.     Objective:  Physical Exam  Vitals reviewed. Constitutional: He is oriented to person, place, and time. He appears well-developed and well-nourished.  HENT:  Head: Normocephalic and atraumatic.  Eyes: Pupils are equal, round, and reactive to light. Conjunctivae and EOM are normal.  Neck: Normal range of motion. Neck supple.  Cardiovascular: Normal rate and intact distal pulses.   Respiratory: Effort normal. No respiratory distress.  GI: Soft. He exhibits no distension.  Genitourinary:  Genitourinary Comments: deferred  Musculoskeletal:       Left hip: He exhibits decreased range of motion and decreased strength.  Neurological: He is alert and oriented to person, place, and time. He has normal reflexes.  Skin: Skin is warm and dry.  Psychiatric: He has a normal mood and affect. His behavior is normal. Judgment and thought content normal.    Vital signs in last 24 hours: @VSRANGES @  Labs:   Estimated body mass index is 23.88 kg/m as calculated from the following:   Height as of 02/21/17: 6\' 1"  (1.854 m).   Weight as of 02/21/17: 82.1 kg (181 lb).   Imaging Review Plain radiographs demonstrate moderate degenerative joint disease of the left hip(s). The bone quality appears to be adequate for age and reported activity  level.  Assessment/Plan:  End stage arthritis, left hip(s) with AVN  The patient history, physical examination, clinical judgement of the provider and imaging studies are consistent with end stage degenerative joint disease of the left hip(s) and total hip arthroplasty is deemed medically necessary. The treatment options including medical management, injection therapy, arthroscopy and arthroplasty were discussed at length. The risks and benefits of total hip arthroplasty were presented and reviewed. The risks due to aseptic loosening, infection, stiffness, dislocation/subluxation,  thromboembolic complications and other imponderables were discussed.  The patient acknowledged the explanation, agreed to proceed with the plan and consent was signed. Patient is being admitted for inpatient treatment for surgery, pain control, PT, OT, prophylactic antibiotics, VTE prophylaxis, progressive ambulation and ADL's and discharge planning.The patient is planning to be discharged HEP home with HEP

## 2017-03-30 ENCOUNTER — Other Ambulatory Visit (HOSPITAL_COMMUNITY): Payer: Self-pay | Admitting: Emergency Medicine

## 2017-03-30 NOTE — Patient Instructions (Addendum)
Jonathan [name]  03/30/2017   Your procedure is scheduled on: 04-06-17  Report to [facility] Main  Entrance Take [place]  elevators to 3rd floor to  [place] at 930AM.    Call this number if you have problems the morning of surgery [phone]   Remember: ONLY 1 PERSON MAY GO WITH YOU TO SHORT STAY TO GET  READY MORNING OF [place].  Do not eat food or drink liquids :After Midnight.     Take these medicines the morning of surgery with A SIP OF WATER: hydrocodone if needed                                You may not have any metal on your body including hair pins and              piercings  Do not wear jewelry, make-up, lotions, powders or perfumes, deodorant                 Men may shave face and neck.    Do not bring valuables to the hospital. [place].  Contacts, dentures or bridgework may not be worn into surgery.  Leave suitcase in the car. After surgery it may be brought to your room.                 Please read over the following fact sheets you were given: _____________________________________________________________________            [facility] - Preparing for Surgery Before surgery, you can play an important role.  Because skin is not sterile, your skin needs to be as free of germs as possible.  You can reduce the number of germs on your skin by washing with CHG (chlorahexidine gluconate) soap before surgery.  CHG is an antiseptic cleaner which kills germs and bonds with the skin to continue killing germs even after washing. Please DO NOT use if you have an allergy to CHG or antibacterial soaps.  If your skin becomes reddened/irritated stop using the CHG and inform your nurse when you arrive at Short Stay. Do not shave (including legs and underarms) for at least 48 hours prior to the first CHG shower.  You may shave your face/neck. Please follow these instructions carefully:  1.   Shower with CHG Soap the night before surgery and the  morning of Surgery.  2.  If you choose to wash your hair, wash your hair first as usual with your  normal  shampoo.  3.  After you shampoo, rinse your hair and body thoroughly to remove the  shampoo.                           4.  Use CHG as you would any other liquid soap.  You can apply chg directly  to the skin and wash                       Gently with a scrungie or clean washcloth.  5.  Apply the CHG Soap to your body ONLY FROM THE NECK DOWN.   Do not use on face/ open  Wound or open sores. Avoid contact with eyes, ears mouth and genitals (private parts).                       Wash face,  Genitals (private parts) with your normal soap.             6.  Wash thoroughly, paying special attention to the area where your surgery  will be performed.  7.  Thoroughly rinse your body with warm water from the neck down.  8.  DO NOT shower/wash with your normal soap after using and rinsing off  the CHG Soap.                9.  Pat yourself dry with a clean towel.            10.  Wear clean pajamas.            11.  Place clean sheets on your bed the night of your first shower and do not  sleep with pets. Day of Surgery : Do not apply any lotions/deodorants the morning of surgery.  Please wear clean clothes to the hospital/surgery center.  FAILURE TO FOLLOW THESE INSTRUCTIONS MAY RESULT IN THE CANCELLATION OF YOUR SURGERY PATIENT SIGNATURE_________________________________  NURSE SIGNATURE__________________________________  ________________________________________________________________________   Adam Phenix  An incentive spirometer is a tool that can help keep your lungs clear and active. This tool measures how well you are filling your lungs with each breath. Taking long deep breaths may help reverse or decrease the chance of developing breathing (pulmonary) problems (especially infection) following:  A long  period of time when you are unable to move or be active. BEFORE THE PROCEDURE   If the spirometer includes an indicator to show your best effort, your nurse or respiratory therapist will set it to a desired goal.  If possible, sit up straight or lean slightly forward. Try not to slouch.  Hold the incentive spirometer in an upright position. INSTRUCTIONS FOR USE  1. Sit on the edge of your bed if possible, or sit up as far as you can in bed or on a chair. 2. Hold the incentive spirometer in an upright position. 3. Breathe out normally. 4. Place the mouthpiece in your mouth and seal your lips tightly around it. 5. Breathe in slowly and as deeply as possible, raising the piston or the ball toward the top of the column. 6. Hold your breath for 3-5 seconds or for as long as possible. Allow the piston or ball to fall to the bottom of the column. 7. Remove the mouthpiece from your mouth and breathe out normally. 8. Rest for a few seconds and repeat Steps 1 through 7 at least 10 times every 1-2 hours when you are awake. Take your time and take a few normal breaths between deep breaths. 9. The spirometer may include an indicator to show your best effort. Use the indicator as a goal to work toward during each repetition. 10. After each set of 10 deep breaths, practice coughing to be sure your lungs are clear. If you have an incision (the cut made at the time of surgery), support your incision when coughing by placing a pillow or rolled up towels firmly against it. Once you are able to get out of bed, walk around indoors and cough well. You may stop using the incentive spirometer when instructed by your caregiver.  RISKS AND COMPLICATIONS  Take your time so you do not get  dizzy or light-headed.  If you are in pain, you may need to take or ask for pain medication before doing incentive spirometry. It is harder to take a deep breath if you are having pain. AFTER USE  Rest and breathe slowly and  easily.  It can be helpful to keep track of a log of your progress. Your caregiver can provide you with a simple table to help with this. If you are using the spirometer at home, follow these instructions: [place] IF:   You are having difficultly using the spirometer.  You have trouble using the spirometer as often as instructed.  Your pain medication is not giving enough relief while using the spirometer.  You develop fever of 100.5 F (38.1 C) or higher. SEEK IMMEDIATE MEDICAL CARE IF:   You cough up bloody sputum that had not been present before.  You develop fever of 102 F (38.9 C) or greater.  You develop worsening pain at or near the incision site. MAKE SURE YOU:   Understand these instructions.  Will watch your condition.  Will get help right away if you are not doing well or get worse. Document Released: 09/26/2006 Document Revised: 08/08/2011 Document Reviewed: 11/27/2006 Exit[employer] Patient Information 2014 ExitCare, [place].   ________________________________________________________________________  WHAT IS A BLOOD TRANSFUSION? Blood Transfusion Information  A transfusion is the replacement of blood or some of its parts. Blood is made up of multiple cells which provide different functions.  Red blood cells carry oxygen and are used for blood loss replacement.  White blood cells fight against infection.  Platelets control bleeding.  Plasma helps clot blood.  Other blood products are available for specialized needs, such as hemophilia or other clotting disorders. BEFORE THE TRANSFUSION  Who gives blood for transfusions?   Healthy volunteers who are fully evaluated to make sure their blood is safe. This is blood bank blood. Transfusion therapy is the safest it has ever been in the practice of medicine. Before blood is taken from a donor, a complete history is taken to make sure that person has no history of diseases nor engages in risky social  behavior (examples are intravenous drug use or sexual activity with multiple partners). The donor's travel history is screened to minimize risk of transmitting infections, such as malaria. The donated blood is tested for signs of infectious diseases, such as HIV and hepatitis. The blood is then tested to be sure it is compatible with you in order to minimize the chance of a transfusion reaction. If you or a relative donates blood, this is often done in anticipation of surgery and is not appropriate for emergency situations. It takes many days to process the donated blood. RISKS AND COMPLICATIONS Although transfusion therapy is very safe and saves many lives, the main dangers of transfusion include:   Getting an infectious disease.  Developing a transfusion reaction. This is an allergic reaction to something in the blood you were given. Every precaution is taken to prevent this. The decision to have a blood transfusion has been considered carefully by your caregiver before blood is given. Blood is not given unless the benefits outweigh the risks. AFTER THE TRANSFUSION  Right after receiving a blood transfusion, you will usually feel much better and more energetic. This is especially true if your red blood cells have gotten low (anemic). The transfusion raises the level of the red blood cells which carry oxygen, and this usually causes an energy increase.  The nurse administering the transfusion will  monitor you carefully for complications. HOME CARE INSTRUCTIONS  No special instructions are needed after a transfusion. You may find your energy is better. Speak with your caregiver about any limitations on activity for underlying diseases you may have. SEEK MEDICAL CARE IF:   Your condition is not improving after your transfusion.  You develop redness or irritation at the intravenous (IV) site. SEEK IMMEDIATE MEDICAL CARE IF:  Any of the following symptoms occur over the next 12 hours:  Shaking  chills.  You have a temperature by mouth above 102 F (38.9 C), not controlled by medicine.  Chest, back, or muscle pain.  People around you feel you are not acting correctly or are confused.  Shortness of breath or difficulty breathing.  Dizziness and fainting.  You get a rash or develop hives.  You have a decrease in urine output.  Your urine turns a dark color or changes to pink, red, or brown. Any of the following symptoms occur over the next 10 days:  You have a temperature by mouth above 102 F (38.9 C), not controlled by medicine.  Shortness of breath.  Weakness after normal activity.  The white part of the eye turns yellow (jaundice).  You have a decrease in the amount of urine or are urinating less often.  Your urine turns a dark color or changes to pink, red, or brown. Document Released: 05/13/2000 Document Revised: 08/08/2011 Document Reviewed: 12/31/2007 [facility] Patient Information 2014 [place], [place].  _______________________________________________________________________

## 2017-03-30 NOTE — Progress Notes (Signed)
Clearance Dr Scarlette Calico on chart   Stress Test , ECHO 2013 epic

## 2017-03-31 ENCOUNTER — Encounter (HOSPITAL_COMMUNITY): Payer: Self-pay

## 2017-03-31 ENCOUNTER — Encounter (HOSPITAL_COMMUNITY)
Admission: RE | Admit: 2017-03-31 | Discharge: 2017-03-31 | Disposition: A | Discharge status: Home / Self Care | Payer: Medicare Other | Source: Ambulatory Visit | Attending: Orthopedic Surgery | Admitting: Orthopedic Surgery

## 2017-03-31 DIAGNOSIS — Z01810 Encounter for preprocedural cardiovascular examination: Secondary | ICD-10-CM | POA: Diagnosis not present

## 2017-03-31 DIAGNOSIS — Z01812 Encounter for preprocedural laboratory examination: Secondary | ICD-10-CM | POA: Diagnosis not present

## 2017-03-31 DIAGNOSIS — I10 Essential (primary) hypertension: Secondary | ICD-10-CM | POA: Diagnosis not present

## 2017-03-31 DIAGNOSIS — M87852 Other osteonecrosis, left femur: Secondary | ICD-10-CM | POA: Diagnosis not present

## 2017-03-31 HISTORY — DX: Idiopathic aseptic necrosis of unspecified bone: M87.00

## 2017-03-31 HISTORY — DX: Nonrheumatic mitral (valve) prolapse: I34.1

## 2017-03-31 LAB — CBC
HCT: 44.9 % (ref 39.0–52.0)
Hemoglobin: 15.8 g/dL (ref 13.0–17.0)
MCH: 29.2 pg (ref 26.0–34.0)
MCHC: 35.2 g/dL (ref 30.0–36.0)
MCV: 82.8 fL (ref 78.0–100.0)
Platelets: 231 10*3/uL (ref 150–400)
RBC: 5.42 MIL/uL (ref 4.22–5.81)
RDW: 13.7 % (ref 11.5–15.5)
WBC: 3.4 10*3/uL — ABNORMAL LOW (ref 4.0–10.5)

## 2017-03-31 LAB — BASIC METABOLIC PANEL
Anion gap: 7 (ref 5–15)
BUN: 13 mg/dL (ref 6–20)
CO2: 26 mmol/L (ref 22–32)
Calcium: 9.2 mg/dL (ref 8.9–10.3)
Chloride: 104 mmol/L (ref 101–111)
Creatinine, Ser: 1 mg/dL (ref 0.61–1.24)
GFR calc Af Amer: >60 mL/min (ref >60)
GFR calc non Af Amer: >60 mL/min (ref >60)
Glucose, Bld: 107 mg/dL — ABNORMAL HIGH (ref 65–99)
Potassium: 4.2 mmol/L (ref 3.5–5.1)
Sodium: 137 mmol/L (ref 135–145)

## 2017-03-31 LAB — SURGICAL PCR SCREEN
MRSA, PCR: NEGATIVE
Staphylococcus aureus: NEGATIVE

## 2017-04-06 ENCOUNTER — Inpatient Hospital Stay (HOSPITAL_COMMUNITY): Payer: Medicare Other | Admitting: Anesthesiology

## 2017-04-06 ENCOUNTER — Other Ambulatory Visit: Payer: Self-pay

## 2017-04-06 ENCOUNTER — Inpatient Hospital Stay (HOSPITAL_COMMUNITY): Payer: Medicare Other

## 2017-04-06 ENCOUNTER — Inpatient Hospital Stay (HOSPITAL_COMMUNITY)
Admission: RE | Admit: 2017-04-06 | Discharge: 2017-04-07 | DRG: 470 | Disposition: A | Discharge status: Home / Self Care | Payer: Medicare Other | Source: Ambulatory Visit | Attending: Orthopedic Surgery | Admitting: Orthopedic Surgery

## 2017-04-06 ENCOUNTER — Encounter (HOSPITAL_COMMUNITY): Payer: Self-pay | Admitting: Anesthesiology

## 2017-04-06 ENCOUNTER — Encounter (HOSPITAL_COMMUNITY)
Admission: RE | Disposition: A | Discharge status: Home / Self Care | Payer: Self-pay | Source: Ambulatory Visit | Attending: Orthopedic Surgery

## 2017-04-06 DIAGNOSIS — Z86010 Personal history of colonic polyps: Secondary | ICD-10-CM

## 2017-04-06 DIAGNOSIS — Z96642 Presence of left artificial hip joint: Secondary | ICD-10-CM | POA: Diagnosis not present

## 2017-04-06 DIAGNOSIS — Z888 Allergy status to other drugs, medicaments and biological substances status: Secondary | ICD-10-CM | POA: Diagnosis not present

## 2017-04-06 DIAGNOSIS — Z7951 Long term (current) use of inhaled steroids: Secondary | ICD-10-CM | POA: Diagnosis not present

## 2017-04-06 DIAGNOSIS — I341 Nonrheumatic mitral (valve) prolapse: Secondary | ICD-10-CM | POA: Diagnosis present

## 2017-04-06 DIAGNOSIS — J309 Allergic rhinitis, unspecified: Secondary | ICD-10-CM | POA: Diagnosis not present

## 2017-04-06 DIAGNOSIS — K210 Gastro-esophageal reflux disease with esophagitis: Secondary | ICD-10-CM | POA: Diagnosis not present

## 2017-04-06 DIAGNOSIS — Z8261 Family history of arthritis: Secondary | ICD-10-CM

## 2017-04-06 DIAGNOSIS — F419 Anxiety disorder, unspecified: Secondary | ICD-10-CM | POA: Diagnosis present

## 2017-04-06 DIAGNOSIS — H5461 Unqualified visual loss, right eye, normal vision left eye: Secondary | ICD-10-CM | POA: Diagnosis not present

## 2017-04-06 DIAGNOSIS — M87052 Idiopathic aseptic necrosis of left femur: Secondary | ICD-10-CM | POA: Diagnosis not present

## 2017-04-06 DIAGNOSIS — M879 Osteonecrosis, unspecified: Principal | ICD-10-CM | POA: Diagnosis present

## 2017-04-06 DIAGNOSIS — Z87891 Personal history of nicotine dependence: Secondary | ICD-10-CM | POA: Diagnosis not present

## 2017-04-06 DIAGNOSIS — E785 Hyperlipidemia, unspecified: Secondary | ICD-10-CM | POA: Diagnosis present

## 2017-04-06 DIAGNOSIS — M87051 Idiopathic aseptic necrosis of right femur: Secondary | ICD-10-CM | POA: Diagnosis not present

## 2017-04-06 DIAGNOSIS — M545 Low back pain: Secondary | ICD-10-CM | POA: Diagnosis present

## 2017-04-06 DIAGNOSIS — Z09 Encounter for follow-up examination after completed treatment for conditions other than malignant neoplasm: Secondary | ICD-10-CM

## 2017-04-06 DIAGNOSIS — M25552 Pain in left hip: Secondary | ICD-10-CM | POA: Diagnosis present

## 2017-04-06 DIAGNOSIS — Z471 Aftercare following joint replacement surgery: Secondary | ICD-10-CM | POA: Diagnosis not present

## 2017-04-06 DIAGNOSIS — M48061 Spinal stenosis, lumbar region without neurogenic claudication: Secondary | ICD-10-CM | POA: Diagnosis not present

## 2017-04-06 DIAGNOSIS — Z419 Encounter for procedure for purposes other than remedying health state, unspecified: Secondary | ICD-10-CM

## 2017-04-06 HISTORY — PX: TOTAL HIP ARTHROPLASTY: SHX124

## 2017-04-06 LAB — TYPE AND SCREEN
ABO/RH(D): B POS
Antibody Screen: NEGATIVE

## 2017-04-06 SURGERY — ARTHROPLASTY, HIP, TOTAL, ANTERIOR APPROACH
Anesthesia: Spinal | Site: Hip | Laterality: Left

## 2017-04-06 MED ORDER — PROPOFOL 500 MG/50ML IV EMUL
INTRAVENOUS | Status: DC | PRN
  Administered 2017-04-06: 75 ug/kg/min via INTRAVENOUS

## 2017-04-06 MED ORDER — MIDAZOLAM HCL 2 MG/2ML IJ SOLN
INTRAMUSCULAR | Status: AC
  Filled 2017-04-06: qty 2

## 2017-04-06 MED ORDER — TAMSULOSIN HCL 0.4 MG PO CAPS
0.4000 mg | ORAL_CAPSULE | Freq: Every day | ORAL | Status: DC
  Administered 2017-04-07: 09:00:00 0.4 mg via ORAL
  Filled 2017-04-06: qty 1

## 2017-04-06 MED ORDER — GLYCOPYRROLATE 0.2 MG/ML IV SOSY
PREFILLED_SYRINGE | INTRAVENOUS | Status: DC | PRN
  Administered 2017-04-06: .1 mg via INTRAVENOUS
  Administered 2017-04-06: .2 mg via INTRAVENOUS

## 2017-04-06 MED ORDER — TRANEXAMIC ACID 1000 MG/10ML IV SOLN
1000.0000 mg | Freq: Once | INTRAVENOUS | Status: DC
  Filled 2017-04-06: qty 10

## 2017-04-06 MED ORDER — POVIDONE-IODINE 10 % EX SWAB
2.0000 "application " | Freq: Once | CUTANEOUS | Status: AC
  Administered 2017-04-06: 2 via TOPICAL

## 2017-04-06 MED ORDER — SODIUM CHLORIDE 0.9 % IV SOLN: INTRAVENOUS | Status: DC

## 2017-04-06 MED ORDER — WATER FOR IRRIGATION, STERILE IR SOLN
Status: DC | PRN
  Administered 2017-04-06: 3000 mL

## 2017-04-06 MED ORDER — SODIUM CHLORIDE 0.9 % IJ SOLN
INTRAMUSCULAR | Status: DC | PRN
  Administered 2017-04-06: 30 mL

## 2017-04-06 MED ORDER — SODIUM CHLORIDE 0.9 % IR SOLN
Status: DC | PRN
  Administered 2017-04-06: 1000 mL

## 2017-04-06 MED ORDER — ACETAMINOPHEN 325 MG PO TABS: 650.0000 mg | ORAL_TABLET | ORAL | Status: DC | PRN

## 2017-04-06 MED ORDER — ISOPROPYL ALCOHOL 70 % SOLN
Status: DC | PRN
  Administered 2017-04-06: 1 via TOPICAL

## 2017-04-06 MED ORDER — POVIDONE-IODINE 10 % EX SWAB: 2.0000 "application " | Freq: Once | CUTANEOUS | Status: DC

## 2017-04-06 MED ORDER — SODIUM CHLORIDE 0.9 % IR SOLN
Status: DC | PRN
  Administered 2017-04-06: 3000 mL

## 2017-04-06 MED ORDER — DUTASTERIDE 0.5 MG PO CAPS
0.5000 mg | ORAL_CAPSULE | Freq: Every day | ORAL | Status: DC
  Administered 2017-04-06 – 2017-04-07 (×2): 0.5 mg via ORAL
  Filled 2017-04-06 (×2): qty 1

## 2017-04-06 MED ORDER — CHLORHEXIDINE GLUCONATE 4 % EX LIQD: 60.0000 mL | Freq: Once | CUTANEOUS | Status: DC

## 2017-04-06 MED ORDER — HYDROCODONE-ACETAMINOPHEN 5-325 MG PO TABS: 1.0000 | ORAL_TABLET | ORAL | Status: DC | PRN

## 2017-04-06 MED ORDER — TRANEXAMIC ACID 1000 MG/10ML IV SOLN
1000.0000 mg | Freq: Once | INTRAVENOUS | Status: DC
  Administered 2017-04-06: 18:00:00 1000 mg via INTRAVENOUS
  Filled 2017-04-06: qty 1100

## 2017-04-06 MED ORDER — KETOROLAC TROMETHAMINE 15 MG/ML IJ SOLN
15.0000 mg | Freq: Four times a day (QID) | INTRAMUSCULAR | Status: DC
  Administered 2017-04-06 – 2017-04-07 (×3): 15 mg via INTRAVENOUS
  Filled 2017-04-06 (×3): qty 1

## 2017-04-06 MED ORDER — METHOCARBAMOL 1000 MG/10ML IJ SOLN
500.0000 mg | Freq: Four times a day (QID) | INTRAVENOUS | Status: DC | PRN
  Filled 2017-04-06 (×2): qty 5

## 2017-04-06 MED ORDER — LACTATED RINGERS IV SOLN
INTRAVENOUS | Status: DC
Start: 2017-04-06 — End: 2017-04-06
  Administered 2017-04-06 (×4): via INTRAVENOUS

## 2017-04-06 MED ORDER — METOCLOPRAMIDE HCL 5 MG/ML IJ SOLN: 5.0000 mg | Freq: Three times a day (TID) | INTRAMUSCULAR | Status: DC | PRN

## 2017-04-06 MED ORDER — SENNA 8.6 MG PO TABS
2.0000 | ORAL_TABLET | Freq: Every day | ORAL | Status: DC
  Administered 2017-04-06: 17.2 mg via ORAL
  Filled 2017-04-06: qty 2

## 2017-04-06 MED ORDER — ASPIRIN 81 MG PO CHEW
81.0000 mg | CHEWABLE_TABLET | Freq: Two times a day (BID) | ORAL | Status: DC
  Administered 2017-04-06 – 2017-04-07 (×2): 81 mg via ORAL
  Filled 2017-04-06 (×2): qty 1

## 2017-04-06 MED ORDER — OXYCODONE HCL 5 MG/5ML PO SOLN
5.0000 mg | Freq: Once | ORAL | Status: DC | PRN
  Filled 2017-04-06: qty 5

## 2017-04-06 MED ORDER — TRANEXAMIC ACID 1000 MG/10ML IV SOLN: 1000.0000 mg | INTRAVENOUS | Status: DC

## 2017-04-06 MED ORDER — ONDANSETRON HCL 4 MG/2ML IJ SOLN: 4.0000 mg | Freq: Four times a day (QID) | INTRAMUSCULAR | Status: DC | PRN

## 2017-04-06 MED ORDER — FENTANYL CITRATE (PF) 100 MCG/2ML IJ SOLN
INTRAMUSCULAR | Status: AC
  Filled 2017-04-06: qty 2

## 2017-04-06 MED ORDER — HYDROMORPHONE HCL 1 MG/ML IJ SOLN: 0.5000 mg | INTRAMUSCULAR | Status: DC | PRN

## 2017-04-06 MED ORDER — PHENOL 1.4 % MT LIQD: 1.0000 | OROMUCOSAL | Status: DC | PRN

## 2017-04-06 MED ORDER — MENTHOL 3 MG MT LOZG: 1.0000 | LOZENGE | OROMUCOSAL | Status: DC | PRN

## 2017-04-06 MED ORDER — ACETAMINOPHEN 10 MG/ML IV SOLN: 1000.0000 mg | INTRAVENOUS | Status: DC

## 2017-04-06 MED ORDER — CEFAZOLIN SODIUM-DEXTROSE 2-4 GM/100ML-% IV SOLN
2.0000 g | INTRAVENOUS | Status: AC
  Administered 2017-04-06: 2 g via INTRAVENOUS
  Filled 2017-04-06: qty 100

## 2017-04-06 MED ORDER — KETOROLAC TROMETHAMINE 30 MG/ML IJ SOLN
INTRAMUSCULAR | Status: DC | PRN
  Administered 2017-04-06: 30 mg

## 2017-04-06 MED ORDER — SODIUM CHLORIDE 0.9 % IJ SOLN
INTRAMUSCULAR | Status: AC
  Filled 2017-04-06: qty 50

## 2017-04-06 MED ORDER — PROPOFOL 10 MG/ML IV BOLUS
INTRAVENOUS | Status: AC
  Filled 2017-04-06: qty 40

## 2017-04-06 MED ORDER — HYDROCODONE-ACETAMINOPHEN 5-325 MG PO TABS
2.0000 | ORAL_TABLET | ORAL | Status: DC | PRN
  Administered 2017-04-06 – 2017-04-07 (×3): 2 via ORAL
  Filled 2017-04-06 (×4): qty 2

## 2017-04-06 MED ORDER — PROPOFOL 10 MG/ML IV BOLUS
INTRAVENOUS | Status: AC
  Filled 2017-04-06: qty 20

## 2017-04-06 MED ORDER — BUPIVACAINE HCL (PF) 0.5 % IJ SOLN
INTRAMUSCULAR | Status: DC | PRN
  Administered 2017-04-06: 3 mL

## 2017-04-06 MED ORDER — POLYETHYLENE GLYCOL 3350 17 G PO PACK: 17.0000 g | PACK | Freq: Every day | ORAL | Status: DC | PRN

## 2017-04-06 MED ORDER — LEVOCETIRIZINE DIHYDROCHLORIDE 5 MG PO TABS: 5.0000 mg | ORAL_TABLET | Freq: Every evening | ORAL | Status: DC

## 2017-04-06 MED ORDER — KETOROLAC TROMETHAMINE 30 MG/ML IJ SOLN: 30.0000 mg | Freq: Once | INTRAMUSCULAR | Status: DC | PRN

## 2017-04-06 MED ORDER — FENTANYL CITRATE (PF) 100 MCG/2ML IJ SOLN
INTRAMUSCULAR | Status: DC | PRN
  Administered 2017-04-06 (×2): 50 ug via INTRAVENOUS

## 2017-04-06 MED ORDER — KETOROLAC TROMETHAMINE 30 MG/ML IJ SOLN
INTRAMUSCULAR | Status: AC
  Filled 2017-04-06: qty 1

## 2017-04-06 MED ORDER — HYDROMORPHONE HCL 1 MG/ML IJ SOLN: 0.2500 mg | INTRAMUSCULAR | Status: DC | PRN

## 2017-04-06 MED ORDER — PANTOPRAZOLE SODIUM 40 MG PO TBEC
40.0000 mg | DELAYED_RELEASE_TABLET | Freq: Every day | ORAL | Status: DC
  Administered 2017-04-06 – 2017-04-07 (×2): 40 mg via ORAL
  Filled 2017-04-06 (×2): qty 1

## 2017-04-06 MED ORDER — BUPIVACAINE-EPINEPHRINE 0.25% -1:200000 IJ SOLN
INTRAMUSCULAR | Status: DC | PRN
  Administered 2017-04-06: 30 mL

## 2017-04-06 MED ORDER — OXYCODONE HCL 5 MG PO TABS: 5.0000 mg | ORAL_TABLET | Freq: Once | ORAL | Status: DC | PRN

## 2017-04-06 MED ORDER — ALUM & MAG HYDROXIDE-SIMETH 200-200-20 MG/5ML PO SUSP: 30.0000 mL | ORAL | Status: DC | PRN

## 2017-04-06 MED ORDER — ONDANSETRON HCL 4 MG/2ML IJ SOLN
INTRAMUSCULAR | Status: DC | PRN
  Administered 2017-04-06: 4 mg via INTRAVENOUS

## 2017-04-06 MED ORDER — MIDAZOLAM HCL 5 MG/5ML IJ SOLN
INTRAMUSCULAR | Status: DC | PRN
  Administered 2017-04-06: 2 mg via INTRAVENOUS

## 2017-04-06 MED ORDER — DIPHENHYDRAMINE HCL 12.5 MG/5ML PO ELIX: 12.5000 mg | ORAL_SOLUTION | ORAL | Status: DC | PRN

## 2017-04-06 MED ORDER — CEFAZOLIN SODIUM-DEXTROSE 2-4 GM/100ML-% IV SOLN: 2.0000 g | INTRAVENOUS | Status: DC

## 2017-04-06 MED ORDER — METOCLOPRAMIDE HCL 5 MG PO TABS: 5.0000 mg | ORAL_TABLET | Freq: Three times a day (TID) | ORAL | Status: DC | PRN

## 2017-04-06 MED ORDER — ONDANSETRON HCL 4 MG PO TABS: 4.0000 mg | ORAL_TABLET | Freq: Four times a day (QID) | ORAL | Status: DC | PRN

## 2017-04-06 MED ORDER — METHOCARBAMOL 500 MG PO TABS
500.0000 mg | ORAL_TABLET | Freq: Four times a day (QID) | ORAL | Status: DC | PRN
  Administered 2017-04-06 – 2017-04-07 (×2): 500 mg via ORAL
  Filled 2017-04-06 (×2): qty 1

## 2017-04-06 MED ORDER — GLYCOPYRROLATE 0.2 MG/ML IV SOSY
PREFILLED_SYRINGE | INTRAVENOUS | Status: AC
  Filled 2017-04-06: qty 5

## 2017-04-06 MED ORDER — ACETAMINOPHEN 10 MG/ML IV SOLN
1000.0000 mg | INTRAVENOUS | Status: AC
  Administered 2017-04-06: 1000 mg via INTRAVENOUS
  Filled 2017-04-06: qty 100

## 2017-04-06 MED ORDER — SODIUM CHLORIDE 0.9 % IV SOLN
INTRAVENOUS | Status: DC
  Administered 2017-04-06: 18:00:00 via INTRAVENOUS

## 2017-04-06 MED ORDER — SODIUM CHLORIDE 0.9 % IV SOLN
1000.0000 mg | INTRAVENOUS | Status: AC
  Administered 2017-04-06: 1000 mg via INTRAVENOUS
  Filled 2017-04-06: qty 1100

## 2017-04-06 MED ORDER — EPHEDRINE SULFATE-NACL 50-0.9 MG/10ML-% IV SOSY
PREFILLED_SYRINGE | INTRAVENOUS | Status: DC | PRN
  Administered 2017-04-06: 10 mg via INTRAVENOUS

## 2017-04-06 MED ORDER — DOCUSATE SODIUM 100 MG PO CAPS
100.0000 mg | ORAL_CAPSULE | Freq: Two times a day (BID) | ORAL | Status: DC
Start: 2017-04-06 — End: 2017-04-07
  Administered 2017-04-06 – 2017-04-07 (×2): 100 mg via ORAL
  Filled 2017-04-06 (×2): qty 1

## 2017-04-06 MED ORDER — ACETAMINOPHEN 650 MG RE SUPP: 650.0000 mg | RECTAL | Status: DC | PRN

## 2017-04-06 MED ORDER — PROMETHAZINE HCL 25 MG/ML IJ SOLN
6.2500 mg | INTRAMUSCULAR | Status: DC | PRN
Start: 2017-04-06 — End: 2017-04-06

## 2017-04-06 MED ORDER — BUPIVACAINE-EPINEPHRINE (PF) 0.25% -1:200000 IJ SOLN
INTRAMUSCULAR | Status: AC
  Filled 2017-04-06: qty 30

## 2017-04-06 MED ORDER — LORATADINE 10 MG PO TABS
10.0000 mg | ORAL_TABLET | Freq: Every day | ORAL | Status: DC
  Administered 2017-04-07: 10 mg via ORAL
  Filled 2017-04-06: qty 1

## 2017-04-06 MED ORDER — MEPERIDINE HCL 50 MG/ML IJ SOLN: 6.2500 mg | INTRAMUSCULAR | Status: DC | PRN

## 2017-04-06 MED ORDER — CEFAZOLIN SODIUM-DEXTROSE 2-4 GM/100ML-% IV SOLN
2.0000 g | Freq: Four times a day (QID) | INTRAVENOUS | Status: AC
  Administered 2017-04-06 (×2): 2 g via INTRAVENOUS
  Filled 2017-04-06 (×2): qty 100

## 2017-04-06 SURGICAL SUPPLY — 48 items
ADH SKN CLS APL DERMABOND .7 (GAUZE/BANDAGES/DRESSINGS) ×1
BAG DECANTER FOR FLEXI CONT (MISCELLANEOUS) IMPLANT
BAG SPEC THK2 15X12 ZIP CLS (MISCELLANEOUS)
BAG ZIPLOCK 12X15 (MISCELLANEOUS) IMPLANT
CAPT HIP TOTAL 2 ×1 IMPLANT
CHLORAPREP W/TINT 26ML (MISCELLANEOUS) ×2 IMPLANT
CLOTH BEACON ORANGE TIMEOUT ST (SAFETY) ×2 IMPLANT
COVER PERINEAL POST (MISCELLANEOUS) ×2 IMPLANT
COVER SURGICAL LIGHT HANDLE (MISCELLANEOUS) ×2 IMPLANT
DECANTER SPIKE VIAL GLASS SM (MISCELLANEOUS) ×2 IMPLANT
DERMABOND ADVANCED (GAUZE/BANDAGES/DRESSINGS) ×1
DERMABOND ADVANCED .7 DNX12 (GAUZE/BANDAGES/DRESSINGS) ×2 IMPLANT
DRAPE SHEET LG 3/4 BI-LAMINATE (DRAPES) ×6 IMPLANT
DRAPE STERI IOBAN 125X83 (DRAPES) ×2 IMPLANT
DRAPE U-SHAPE 47X51 STRL (DRAPES) ×4 IMPLANT
DRSG AQUACEL AG ADV 3.5X10 (GAUZE/BANDAGES/DRESSINGS) ×2 IMPLANT
ELECT PENCIL ROCKER SW 15FT (MISCELLANEOUS) ×2 IMPLANT
ELECT REM PT RETURN 15FT ADLT (MISCELLANEOUS) ×2 IMPLANT
GAUZE SPONGE 4X4 12PLY STRL (GAUZE/BANDAGES/DRESSINGS) ×2 IMPLANT
GLOVE BIO SURGEON STRL SZ8.5 (GLOVE) ×4 IMPLANT
GLOVE BIOGEL M STRL SZ7.5 (GLOVE) ×1 IMPLANT
GLOVE BIOGEL PI IND STRL 8.5 (GLOVE) ×1 IMPLANT
GLOVE BIOGEL PI INDICATOR 8.5 (GLOVE) ×1
GOWN SPEC L3 XXLG W/TWL (GOWN DISPOSABLE) ×3 IMPLANT
GOWN STRL REUS W/ TWL LRG LVL4 (GOWN DISPOSABLE) IMPLANT
GOWN STRL REUS W/TWL LRG LVL4 (GOWN DISPOSABLE) ×6
HANDPIECE INTERPULSE COAX TIP (DISPOSABLE) ×2
HOLDER FOLEY CATH W/STRAP (MISCELLANEOUS) ×2 IMPLANT
HOOD PEEL AWAY FLYTE STAYCOOL (MISCELLANEOUS) ×7 IMPLANT
MARKER SKIN DUAL TIP RULER LAB (MISCELLANEOUS) ×2 IMPLANT
NDL SPNL 18GX3.5 QUINCKE PK (NEEDLE) ×1 IMPLANT
NEEDLE SPNL 18GX3.5 QUINCKE PK (NEEDLE) ×2 IMPLANT
PACK ANTERIOR HIP CUSTOM (KITS) ×2 IMPLANT
SAW OSC TIP CART 19.5X105X1.3 (SAW) ×2 IMPLANT
SEALER BIPOLAR AQUA 6.0 (INSTRUMENTS) ×2 IMPLANT
SET HNDPC FAN SPRY TIP SCT (DISPOSABLE) ×1 IMPLANT
SUT ETHIBOND NAB CT1 #1 30IN (SUTURE) ×4 IMPLANT
SUT MNCRL AB 3-0 PS2 18 (SUTURE) ×2 IMPLANT
SUT MON AB 2-0 CT1 36 (SUTURE) ×4 IMPLANT
SUT STRATAFIX PDO 1 14 VIOLET (SUTURE) ×2
SUT STRATFX PDO 1 14 VIOLET (SUTURE) ×1
SUT VIC AB 2-0 CT1 27 (SUTURE) ×2
SUT VIC AB 2-0 CT1 TAPERPNT 27 (SUTURE) ×1 IMPLANT
SUTURE STRATFX PDO 1 14 VIOLET (SUTURE) ×1 IMPLANT
SYR 50ML LL SCALE MARK (SYRINGE) ×2 IMPLANT
TRAY FOLEY W/METER SILVER 16FR (SET/KITS/TRAYS/PACK) IMPLANT
WATER STERILE IRR 1000ML POUR (IV SOLUTION) ×2 IMPLANT
YANKAUER SUCT BULB TIP 10FT TU (MISCELLANEOUS) ×2 IMPLANT

## 2017-04-06 NOTE — Op Note (Signed)
OPERATIVE REPORT  SURGEON: Rod Can, MD   ASSISTANT: Nehemiah Massed, PA-C.  PREOPERATIVE DIAGNOSIS: Left hip avascular necrosis.   POSTOPERATIVE DIAGNOSIS: Left hip avascular necrosis.   PROCEDURE: Left total hip arthroplasty, anterior approach.   IMPLANTS: DePuy Tri Lock stem, size 8, hi offset. DePuy Pinnacle Cup, size 58 mm. DePuy Altrx liner, size 36 by 58 mm, neutral. DePuy Biolox ceramic head ball, size 36 + 1.5 mm.  ANESTHESIA:  Spinal  ESTIMATED BLOOD LOSS:-250 mL    ANTIBIOTICS: 2 g Ancef.  DRAINS: None.  COMPLICATIONS: None.   CONDITION: PACU - hemodynamically stable.   BRIEF CLINICAL NOTE: Jonathan [name] is a 61 y.o. male with a long-standing history of Left hip avascular necrosis. After failing conservative management, the patient was indicated for total hip arthroplasty. The risks, benefits, and alternatives to the procedure were explained, and the patient elected to proceed.  PROCEDURE IN DETAIL: Surgical site was marked by myself in the pre-op holding area. Once inside the operating room, spinal anesthesia was obtained, and a foley catheter was inserted. The patient was then positioned on the [place] table. All bony prominences were well padded. The hip was prepped and draped in the normal sterile surgical fashion. A time-out was called verifying side and site of surgery. The patient received IV antibiotics within 60 minutes of beginning the procedure.  The direct anterior approach to the hip was performed through the Hueter interval. Lateral femoral circumflex vessels were treated with the Auqumantys. The anterior capsule was exposed and an inverted T capsulotomy was made.The femoral neck cut was made to the level of the templated cut. A corkscrew was placed into the head and the head was removed. The femoral head was found to have eburnated bone. The head was passed to the back table and was measured.  Acetabular exposure was achieved, and the  pulvinar and labrum were excised. Sequential reaming of the acetabulum was then performed up to a size 57 mm reamer. A 58 mm cup was then opened and impacted into place at approximately 40 degrees of abduction and 20 degrees of anteversion. The final polyethylene liner was impacted into place and acetabular osteophytes were removed.   I then gained femoral exposure taking care to protect the abductors and greater trochanter. This was performed using standard external rotation, extension, and adduction. The capsule was peeled off the inner aspect of the greater trochanter, taking care to preserve the short external rotators. A cookie cutter was used to enter the femoral canal, and then the femoral canal finder was placed. Sequential broaching was performed up to a size 8. Calcar planer was used on the femoral neck remnant. I placed a hi offset neck and a trial head ball. The hip was reduced. Leg lengths and offset were checked fluoroscopically. The hip was dislocated and trial components were removed. The final implants were placed, and the hip was reduced.  Fluoroscopy was used to confirm component position and leg lengths. At 90 degrees of external rotation and full extension, the hip was stable to an anterior directed force.  The wound was copiously irrigated with normal saline using pulse lavage. Marcaine solution was injected into the periarticular soft tissue. The wound was closed in layers using #1 Vicryl and V-Loc for the fascia, 2-0 Vicryl for the subcutaneous fat, 2-0 Monocryl for the deep dermal layer, 3-0 running Monocryl subcuticular stitch, and Dermabond for the skin. Once the glue was fully dried, an Aquacell Ag dressing was applied. The patient was transported to  the recovery room in stable condition. Sponge, needle, and instrument counts were correct at the end of the case x2. The patient tolerated the procedure well and there were no known complications.  Please note that a  surgical assistant was a medical necessity for this procedure to perform it in a safe and expeditious manner. Assistant was necessary to provide appropriate retraction of vital neurovascular structures, to prevent femoral fracture, and to allow for anatomic placement of the prosthesis.

## 2017-04-06 NOTE — Discharge Instructions (Signed)
°Dr. Seibert Keeter °Joint Replacement Specialist °[facility] °[address] °,  [phone] °[phone] ° ° °TOTAL HIP REPLACEMENT POSTOPERATIVE DIRECTIONS ° ° ° °Hip Rehabilitation, Guidelines Following Surgery  ° °WEIGHT BEARING °Weight bearing as tolerated with assist device (walker, cane, etc) as directed, use it as long as suggested by your surgeon or therapist, typically at least 4-6 weeks. ° °The results of a hip operation are greatly improved after range of motion and muscle strengthening exercises. Follow all safety measures which are given to protect your hip. If any of these exercises cause increased pain or swelling in your joint, decrease the amount until you are comfortable again. Then slowly increase the exercises. Call your caregiver if you have problems or questions.  ° °HOME CARE INSTRUCTIONS  °Most of the following instructions are designed to prevent the dislocation of your new hip.  °Remove items at home which could result in a fall. This includes throw rugs or furniture in walking pathways.  °Continue medications as instructed at time of discharge. °· You may have some home medications which will be placed on hold until you complete the course of blood thinner medication. °· You may start showering once you are discharged home. Do not remove your dressing. °Do not put on socks or shoes without following the instructions of your caregivers.   °Sit on chairs with arms. Use the chair arms to help push yourself up when arising.  °Arrange for the use of a toilet seat elevator so you are not sitting low.  °· Walk with walker as instructed.  °You may resume a sexual relationship in one month or when given the OK by your caregiver.  °Use walker as long as suggested by your caregivers.  °You may put full weight on your legs and walk as much as is comfortable. °Avoid periods of inactivity such as sitting longer than an hour when not asleep. This helps prevent  blood clots.  °You may return to work once you are cleared by your surgeon.  °Do not drive a car for 6 weeks or until released by your surgeon.  °Do not drive while taking narcotics.  °Wear elastic stockings for two weeks following surgery during the day but you may remove then at night.  °Make sure you keep all of your appointments after your operation with all of your doctors and caregivers. You should call the office at the above phone number and make an appointment for approximately two weeks after the date of your surgery. °Please pick up a stool softener and laxative for home use as long as you are requiring pain medications. °· ICE to the affected hip every three hours for 30 minutes at a time and then as needed for pain and swelling. Continue to use ice on the hip for pain and swelling from surgery. You may notice swelling that will progress down to the foot and ankle.  This is normal after surgery.  Elevate the leg when you are not up walking on it.   °It is important for you to complete the blood thinner medication as prescribed by your doctor. °· Continue to use the breathing machine which will help keep your temperature down.  It is common for your temperature to cycle up and down following surgery, especially at night when you are not up moving around and exerting yourself.  The breathing machine keeps your lungs expanded and your temperature down. ° °RANGE OF MOTION AND STRENGTHENING EXERCISES  °These exercises are   designed to help you keep full movement of your hip joint. Follow your caregiver's or physical therapist's instructions. Perform all exercises about fifteen times, three times per day or as directed. Exercise both hips, even if you have had only one joint replacement. These exercises can be done on a training (exercise) mat, on the floor, on a table or on a bed. Use whatever works the best and is most comfortable for you. Use music or television while you are exercising so that the exercises  are a pleasant break in your day. This will make your life better with the exercises acting as a break in routine you can look forward to.  °Lying on your back, slowly slide your foot toward your buttocks, raising your knee up off the floor. Then slowly slide your foot back down until your leg is straight again.  °Lying on your back spread your legs as far apart as you can without causing discomfort.  °Lying on your side, raise your upper leg and foot straight up from the floor as far as is comfortable. Slowly lower the leg and repeat.  °Lying on your back, tighten up the muscle in the front of your thigh (quadriceps muscles). You can do this by keeping your leg straight and trying to raise your heel off the floor. This helps strengthen the largest muscle supporting your knee.  °Lying on your back, tighten up the muscles of your buttocks both with the legs straight and with the knee bent at a comfortable angle while keeping your heel on the floor.  ° °SKILLED REHAB INSTRUCTIONS: °If the patient is transferred to a skilled rehab facility following release from the hospital, a list of the current medications will be sent to the facility for the patient to continue.  When discharged from the skilled rehab facility, please have the facility set up the patient's Home Health Physical Therapy prior to being released. Also, the skilled facility will be responsible for providing the patient with their medications at time of release from the facility to include their pain medication and their blood thinner medication. If the patient is still at the rehab facility at time of the two week follow up appointment, the skilled rehab facility will also need to assist the patient in arranging follow up appointment in our office and any transportation needs. ° °MAKE SURE YOU:  °Understand these instructions.  °Will watch your condition.  °Will get help right away if you are not doing well or get worse. ° °Pick up stool softner and  laxative for home use following surgery while on pain medications. °Do not remove your dressing. °The dressing is waterproof--it is OK to take showers. °Continue to use ice for pain and swelling after surgery. °Do not use any lotions or creams on the incision until instructed by your surgeon. °Total Hip Protocol. ° ° °

## 2017-04-06 NOTE — Interval H&P Note (Signed)
History and Physical Interval Note:  04/06/2017 11:27 AM  Jonathan [name]  has presented today for surgery, with the diagnosis of Avascular necrosis left hip  The various methods of treatment have been discussed with the patient and family. After consideration of risks, benefits and other options for treatment, the patient has consented to  Procedure(s) with comments: LEFT TOTAL HIP ARTHROPLASTY ANTERIOR APPROACH (Left) - Needs RNFA as a surgical intervention .  The patient's history has been reviewed, patient examined, no change in status, stable for surgery.  I have reviewed the patient's chart and labs.  Questions were answered to the patient's satisfaction.     Jonathan [name], Horald Pollen

## 2017-04-06 NOTE — Progress Notes (Signed)
Portable AP Pelvis X-ray done. 

## 2017-04-06 NOTE — Progress Notes (Addendum)
Spoke to [place], Software engineer regarding duplicate medication orders.  She verbalized that she would discontinue all duplicate orders.  Also spoke to pt and he confirmed that he was having his left hip operated on.  Informed consent detail order for right knee surgery discontinued. Called Dr. Sid Falcon OR room where he was in procedure, spoke to nurse and informed to discontinue order for knee operation.

## 2017-04-06 NOTE — Anesthesia Procedure Notes (Signed)
Spinal  Patient location during procedure: OR Staffing Anesthesiologist: Wane Mollett, MD Performed: anesthesiologist  Preanesthetic Checklist Completed: patient identified, site marked, surgical consent, pre-op evaluation, timeout performed, IV checked, risks and benefits discussed and monitors and equipment checked Spinal Block Patient position: sitting Prep: ChloraPrep and site prepped and draped Patient monitoring: heart rate, continuous pulse ox and blood pressure Approach: right paramedian Location: L3-4 Injection technique: single-shot Needle Needle type: Sprotte  Needle gauge: 24 G Needle length: 9 cm Assessment Sensory level: T8 Additional Notes Expiration date of kit checked and confirmed. Patient tolerated procedure well, without complications.       

## 2017-04-06 NOTE — Transfer of Care (Signed)
Immediate Anesthesia Transfer of Care Note  Patient: Jonathan [name]  Procedure(s) Performed: LEFT TOTAL HIP ARTHROPLASTY ANTERIOR APPROACH (Left Hip)  Patient Location: PACU  Anesthesia Type:Spinal  Level of Consciousness: awake, alert  and oriented  Airway & Oxygen Therapy: Patient Spontanous Breathing and Patient connected to face mask oxygen  Post-op Assessment: Report given to RN  Post vital signs: Reviewed and stable  Last Vitals:  Vitals:   04/06/17 0913  BP: (!) 150/97  Pulse: (!) 45  Resp: 16  Temp: 36.6 C  SpO2: 99%    Last Pain:  Vitals:   04/06/17 0958  TempSrc:   PainSc: 6       Patients Stated Pain Goal: 3 (35/57/32 2025)  Complications: No apparent anesthesia complications

## 2017-04-07 ENCOUNTER — Other Ambulatory Visit: Payer: Self-pay

## 2017-04-07 ENCOUNTER — Encounter (HOSPITAL_COMMUNITY): Payer: Self-pay | Admitting: Orthopedic Surgery

## 2017-04-07 LAB — CBC
HCT: 36.7 % — ABNORMAL LOW (ref 39.0–52.0)
Hemoglobin: 12.7 g/dL — ABNORMAL LOW (ref 13.0–17.0)
MCH: 28.7 pg (ref 26.0–34.0)
MCHC: 34.6 g/dL (ref 30.0–36.0)
MCV: 82.8 fL (ref 78.0–100.0)
Platelets: 196 10*3/uL (ref 150–400)
RBC: 4.43 MIL/uL (ref 4.22–5.81)
RDW: 13.5 % (ref 11.5–15.5)
WBC: 9.4 10*3/uL (ref 4.0–10.5)

## 2017-04-07 LAB — BASIC METABOLIC PANEL
Anion gap: 6 (ref 5–15)
BUN: 8 mg/dL (ref 6–20)
CO2: 27 mmol/L (ref 22–32)
Calcium: 8.3 mg/dL — ABNORMAL LOW (ref 8.9–10.3)
Chloride: 103 mmol/L (ref 101–111)
Creatinine, Ser: 0.77 mg/dL (ref 0.61–1.24)
GFR calc Af Amer: >60 mL/min (ref >60)
GFR calc non Af Amer: >60 mL/min (ref >60)
Glucose, Bld: 102 mg/dL — ABNORMAL HIGH (ref 65–99)
Potassium: 4 mmol/L (ref 3.5–5.1)
Sodium: 136 mmol/L (ref 135–145)

## 2017-04-07 MED ORDER — HYDROCODONE-ACETAMINOPHEN 5-325 MG PO TABS: 1.0000 | ORAL_TABLET | ORAL | 0 refills | Status: DC | PRN

## 2017-04-07 MED ORDER — DOCUSATE SODIUM 100 MG PO CAPS: 100.0000 mg | ORAL_CAPSULE | Freq: Two times a day (BID) | ORAL | 1 refills | Status: DC

## 2017-04-07 MED ORDER — SENNA 8.6 MG PO TABS: 2.0000 | ORAL_TABLET | Freq: Every day | ORAL | 0 refills | Status: DC

## 2017-04-07 MED ORDER — ONDANSETRON HCL 4 MG PO TABS: 4.0000 mg | ORAL_TABLET | Freq: Four times a day (QID) | ORAL | 0 refills | Status: DC | PRN

## 2017-04-07 MED ORDER — ASPIRIN 81 MG PO CHEW: 81.0000 mg | CHEWABLE_TABLET | Freq: Two times a day (BID) | ORAL | 1 refills | Status: DC

## 2017-04-07 NOTE — Anesthesia Preprocedure Evaluation (Signed)
Anesthesia Evaluation  Patient identified by MRN, date of birth, ID band Patient awake    Reviewed: Allergy & Precautions, NPO status , Patient's Chart, lab work & pertinent test results  Airway Mallampati: II  TM Distance: >3 FB Neck ROM: Full    Dental no notable dental hx.    Pulmonary Current Smoker, former smoker,    Pulmonary exam normal breath sounds clear to auscultation       Cardiovascular negative cardio ROS Normal cardiovascular exam Rhythm:Regular Rate:Normal     Neuro/Psych PSYCHIATRIC DISORDERS Anxiety negative neurological ROS     GI/Hepatic negative GI ROS, Neg liver ROS,   Endo/Other  negative endocrine ROS  Renal/GU negative Renal ROS     Musculoskeletal negative musculoskeletal ROS (+) Arthritis ,   Abdominal   Peds  Hematology negative hematology ROS (+)   Anesthesia Other Findings   Reproductive/Obstetrics negative OB ROS                             Anesthesia Physical  Anesthesia Plan  ASA: II  Anesthesia Plan: Spinal   Post-op Pain Management:    Induction: Intravenous  PONV Risk Score and Plan: 1 and Ondansetron and Propofol infusion  Airway Management Planned: Simple Face Mask  Additional Equipment:   Intra-op Plan:   Post-operative Plan: Extubation in OR  Informed Consent: I have reviewed the patients History and Physical, chart, labs and discussed the procedure including the risks, benefits and alternatives for the proposed anesthesia with the patient or authorized representative who has indicated his/her understanding and acceptance.   Dental advisory given  Plan Discussed with: CRNA  Anesthesia Plan Comments:         Anesthesia Quick Evaluation

## 2017-04-07 NOTE — Discharge Summary (Signed)
Physician Discharge Summary  Patient ID: Jonathan [name] MRN: [id number] DOB/AGE: 1955-12-09 61 y.o.  Admit date: 04/06/2017 Discharge date: 04/07/2017  Admission Diagnoses:  Avascular necrosis of bones of both hips [facility])  Discharge Diagnoses:  Principal Problem:   Avascular necrosis of bones of both hips ([place]) Active Problems:   Avascular necrosis of hip, left [facility])   Past Medical History:  Diagnosis Date  . Anxiety   . Arthritis   . Avascular necrosis of bone (HCC)    left hip   . Back pain, lumbosacral 2000  . Blind right eye   . Colon polyps 08/29/2011   Hyperplastic colon polyps  . Mitral valve prolapse     Surgeries: Procedure(s): LEFT TOTAL HIP ARTHROPLASTY ANTERIOR APPROACH on 04/06/2017   Consultants (if any):   Discharged Condition: Improved  Hospital Course: Jonathan [name] is an 61 y.o. male who was admitted 04/06/2017 with a diagnosis of Avascular necrosis of bones of both hips ([place]) and went to the operating room on 04/06/2017 and underwent the above named procedures.    He was given perioperative antibiotics:  Anti-infectives (From admission, onward)   Start     Dose/Rate Route Frequency Ordered Stop   04/06/17 1800  ceFAZolin (ANCEF) IVPB 2g/100 mL premix     2 g 200 mL/hr over 30 Minutes Intravenous Every 6 hours 04/06/17 1640 04/07/17 0222   04/06/17 0922  ceFAZolin (ANCEF) IVPB 2g/100 mL premix  Status:  Discontinued     2 g 200 mL/hr over 30 Minutes Intravenous On call to O.R. 04/06/17 4098 04/06/17 0927   04/06/17 0922  ceFAZolin (ANCEF) IVPB 2g/100 mL premix     2 g 200 mL/hr over 30 Minutes Intravenous On call to O.R. 04/06/17 1191 04/06/17 1150    .  He was given sequential compression devices, early ambulation, and ASA for DVT prophylaxis.  He benefited maximally from the hospital stay and there were no complications.    Recent vital signs:  Vitals:   04/07/17 0503 04/07/17 0932  BP: 128/88 138/85  Pulse: (!) 57 61  Resp: 16 16   Temp: 98.1 F (36.7 C) 98 F (36.7 C)  SpO2: 100% 100%    Recent laboratory studies:  Lab Results  Component Value Date   HGB 12.7 (L) 04/07/2017   HGB 15.8 03/31/2017   HGB 15.6 09/29/2016   Lab Results  Component Value Date   WBC 9.4 04/07/2017   PLT 196 04/07/2017   Lab Results  Component Value Date   INR 0.97 09/08/2014   Lab Results  Component Value Date   NA 136 04/07/2017   K 4.0 04/07/2017   CL 103 04/07/2017   CO2 27 04/07/2017   BUN 8 04/07/2017   CREATININE 0.77 04/07/2017   GLUCOSE 102 (H) 04/07/2017    Discharge Medications:   Allergies as of 04/07/2017      Reactions   Cortisone    REACTION: rash, "act different"      Medication List    STOP taking these medications   HYDROcodone-acetaminophen 10-325 MG tablet Commonly known as:  NORCO Replaced by:  HYDROcodone-acetaminophen 5-325 MG tablet     TAKE these medications   ALPRAZolam 1 MG tablet Commonly known as:  XANAX Take 1 mg by mouth 3 (three) times daily as needed for anxiety.   aspirin 81 MG chewable tablet Chew 1 tablet (81 mg total) 2 (two) times daily by mouth.   atorvastatin 20 MG tablet Commonly known as:  LIPITOR Take  1 tablet (20 mg total) by mouth daily.   Azelastine-Fluticasone 137-50 MCG/ACT Susp Commonly known as:  DYMISTA Place 2 Act into the nose 2 (two) times daily.   Betamethasone Valerate 0.12 % foam APPLY TOPICALLY TO AFFECTED AREA(S) TWICE DAILY What changed:  See the new instructions.   DEXILANT 60 MG capsule Generic drug:  dexlansoprazole TAKE 1 CAPSULE (60 MG TOTAL) BY MOUTH DAILY.   docusate sodium 100 MG capsule Commonly known as:  COLACE Take 1 capsule (100 mg total) 2 (two) times daily by mouth.   dutasteride 0.5 MG capsule Commonly known as:  AVODART Take 1 capsule (0.5 mg total) by mouth daily.   HYDROcodone-acetaminophen 5-325 MG tablet Commonly known as:  NORCO/VICODIN Take 1-2 tablets every 4 (four) hours as needed by mouth for moderate  pain ((score 4 to 6)). Replaces:  HYDROcodone-acetaminophen 10-325 MG tablet   ibuprofen 800 MG tablet Commonly known as:  ADVIL,MOTRIN TAKE ONE TABLET BY MOUTH EVERY 8 HOURS AS NEEDED What changed:    how much to take  how to take this  when to take this   levocetirizine 5 MG tablet Commonly known as:  XYZAL Take 1 tablet (5 mg total) by mouth every evening.   ondansetron 4 MG tablet Commonly known as:  ZOFRAN Take 1 tablet (4 mg total) every 6 (six) hours as needed by mouth for nausea.   senna 8.6 MG Tabs tablet Commonly known as:  SENOKOT Take 2 tablets (17.2 mg total) at bedtime by mouth.   tamsulosin 0.4 MG Caps capsule Commonly known as:  FLOMAX Take 1 capsule (0.4 mg total) by mouth daily.       Diagnostic Studies: Dg Pelvis Portable  Result Date: 04/06/2017 CLINICAL DATA:  Status post left total hip arthroplasty. EXAM: PORTABLE PELVIS 1-2 VIEWS COMPARISON:  Earlier same day spot fluoro films. FINDINGS: Frontal pelvis shows the patient to be status post bilateral total hip arthroplasty. [place] some earlier today indicate left prosthesis is new. No evidence for immediate hardware complication. Gas in the soft tissues of the left hip region is compatible with the immediate postoperative state. IMPRESSION: Status post left total hip replacement without evidence for immediate hardware complications. Electronically Signed   By: Misty Stanley M.D.   On: 04/06/2017 14:23   Dg C-arm 1-60 Min-no Report  Result Date: 04/06/2017 Fluoroscopy was utilized by the requesting physician.  No radiographic interpretation.   Dg Hip Operative Unilat W Or W/o Pelvis Left  Result Date: 04/06/2017 CLINICAL DATA:  Intraoperative films from total hip replacement EXAM: OPERATIVE left HIP (WITH PELVIS IF PERFORMED) 2 VIEWS TECHNIQUE: Fluoroscopic spot image(s) were submitted for interpretation post-operatively. COMPARISON:  None. FINDINGS: Two C-arm spot films show the acetabular and femoral  components of the left hip replacement to be in good position. No complicating features are seen. IMPRESSION: Left total hip replacement components in good position. Electronically Signed   By: Ivar Drape M.D.   On: 04/06/2017 13:32    Disposition: 01-Home or Self Care  Discharge Instructions    Call MD / Call 911   Complete by:  As directed    If you experience chest pain or shortness of breath, CALL 911 and be transported to the hospital emergency room.  If you develope a fever above 101 F, pus (white drainage) or increased drainage or redness at the wound, or calf pain, call your surgeon's office.   Constipation Prevention   Complete by:  As directed    Drink plenty of  fluids.  Prune juice may be helpful.  You may use a stool softener, such as Colace (over the counter) 100 mg twice a day.  Use MiraLax (over the counter) for constipation as needed.   Diet - low sodium heart healthy   Complete by:  As directed    Driving restrictions   Complete by:  As directed    No driving for 6 weeks   Increase activity slowly as tolerated   Complete by:  As directed    Lifting restrictions   Complete by:  As directed    No lifting for 6 weeks   TED hose   Complete by:  As directed    Use stockings (TED hose) for 2 weeks on both leg(s).  You may remove them at night for sleeping.      Follow-up Information    Mykah Shin, Aaron Edelman, MD. Schedule an appointment as soon as possible for a visit in 2 weeks.   Specialty:  Orthopedic Surgery Why:  For wound re-check Contact information: [address] [address] [phone]            Signed: Elie Goody 04/07/2017, 2:40 PM

## 2017-04-07 NOTE — Plan of Care (Signed)
All goals met for discharge.  Thereasa Distance RN

## 2017-04-07 NOTE — Progress Notes (Addendum)
Physical Therapy Treatment Patient Details Name: Jonathan [name] MRN: [id number] DOB: [birth year] Today's Date: 04/07/2017    History of Present Illness L DATHA    PT Comments    Ready for DC.   Follow Up Recommendations  No PT follow up     Equipment Recommendations  None recommended by PT    Recommendations for Other Services       Precautions / Restrictions Precautions Precautions: Fall Restrictions Weight Bearing Restrictions: No    Mobility  Bed Mobility               General bed mobility comments: on bed edge  Transfers Overall transfer level: Modified independent Equipment used: Crutches                Ambulation/Gait Ambulation/Gait assistance: Modified independent (Device/Increase time) Ambulation Distance (Feet): 200 Feet Assistive device: Crutches Gait Pattern/deviations: Step-through pattern     General Gait Details: cues for safety, Ambulated x 8' without AD.   Stairs Stairs: Yes   Stair Management: No rails Number of Stairs: 2 General stair comments: cues for sequence and safety  Wheelchair Mobility    Modified Rankin (Stroke Patients Only)       Balance                                            Cognition Arousal/Alertness: Awake/alert Behavior During Therapy: WFL for tasks assessed/performed Overall Cognitive Status: Within Functional Limits for tasks assessed                                        Exercises Total Joint Exercises Long Arc Quad: AROM;Left;10 reps;Seated    General Comments        Pertinent Vitals/Pain Pain Assessment: No/denies pain Pain Score: 2  Pain Descriptors / Indicators: Sore Pain Intervention(s): Monitored during session;Premedicated before session    Home Living Family/patient expects to be discharged to:: Private residence Living Arrangements: Spouse/significant other Available Help at Discharge: Family Type of Home: House Home Access:  Stairs to enter Entrance Stairs-Rails: Right;Left Home Layout: One level Home Equipment: Crutches;Cane - single point      Prior Function Level of Independence: Independent          PT Goals (current goals can now be found in the care plan section) Acute Rehab PT Goals Patient Stated Goal: go home PT Goal Formulation: With patient/family Time For Goal Achievement: 04/08/17 Potential to Achieve Goals: Good Progress towards PT goals: Progressing toward goals    Frequency    7X/week      PT Plan Current plan remains appropriate    Co-evaluation              AM-PAC PT "6 Clicks" Daily Activity  Outcome Measure  Difficulty turning over in bed (including adjusting bedclothes, sheets and blankets)?: None Difficulty moving from lying on back to sitting on the side of the bed? : None Difficulty sitting down on and standing up from a chair with arms (e.g., wheelchair, bedside commode, etc,.)?: None Help needed moving to and from a bed to chair (including a wheelchair)?: None Help needed walking in hospital room?: None Help needed climbing 3-5 steps with a railing? : None 6 Click Score: 24    End of Session   Activity Tolerance: Patient  tolerated treatment well Patient left: in bed Nurse Communication: Mobility status PT Visit Diagnosis: Difficulty in walking, not elsewhere classified (R26.2)     Time: 3818-2993 PT Time Calculation (min) (ACUTE ONLY): 10 min  Charges:  $Gait Training: 8-22 mins                    G Codes:          Claretha Cooper 04/07/2017, 10:59 AM

## 2017-04-07 NOTE — Progress Notes (Signed)
Patient discharged to home with family. Given all belongings, instructions, prescriptions, equipment. Wife present for all teaching. Patient reports he has a pain management contract with another MD and will not fill the Norco prescription provided by Dr.Swintek. Recommended to patient to let the MD know.  Escorted to pov via w/c.  Thereasa Distance RN

## 2017-04-07 NOTE — Anesthesia Postprocedure Evaluation (Signed)
Anesthesia Post Note  Patient: Jonathan [name]  Procedure(s) Performed: LEFT TOTAL HIP ARTHROPLASTY ANTERIOR APPROACH (Left Hip)     Patient location during evaluation: PACU Anesthesia Type: Spinal Level of consciousness: awake and alert Pain management: pain level controlled Vital Signs Assessment: post-procedure vital signs reviewed and stable Respiratory status: spontaneous breathing and respiratory function stable Cardiovascular status: blood pressure returned to baseline and stable Postop Assessment: spinal receding Anesthetic complications: no    Last Vitals:  Vitals:   04/07/17 0503 04/07/17 0932  BP: 128/88 138/85  Pulse: (!) 57 61  Resp: 16 16  Temp: 36.7 C 36.7 C  SpO2: 100% 100%    Last Pain:  Vitals:   04/07/17 0932  TempSrc: Oral  PainSc:                  Nolon Nations

## 2017-04-07 NOTE — Evaluation (Signed)
Physical Therapy Evaluation Patient Details Name: Jonathan [name] MRN: [id number] DOB: [birth year] Today's Date: 04/07/2017   History of Present Illness  L DATHA  Clinical Impression  Pateitn desires to use crutches. Will practice next  Session and steps. Pt admitted with above diagnosis. Pt currently with functional limitations due to the deficits listed below (see PT Problem List). Pt will benefit from skilled PT to increase their independence and safety with mobility to allow discharge to the venue listed below.       Follow Up Recommendations No PT follow up    Equipment Recommendations  None recommended by PT    Recommendations for Other Services       Precautions / Restrictions Precautions Precautions: Fall Restrictions Weight Bearing Restrictions: No      Mobility  Bed Mobility               General bed mobility comments: on bed edge  Transfers Overall transfer level: Modified independent Equipment used: Rolling walker (2 wheeled)                Ambulation/Gait Ambulation/Gait assistance: Modified independent (Device/Increase time) Ambulation Distance (Feet): 200 Feet Assistive device: Rolling walker (2 wheeled) Gait Pattern/deviations: Step-through pattern     General Gait Details: cues for safety, Ambulated x 8' without AD.  Stairs            Wheelchair Mobility    Modified Rankin (Stroke Patients Only)       Balance                                             Pertinent Vitals/Pain Pain Assessment: 0-10 Pain Score: 2  Pain Descriptors / Indicators: Sore Pain Intervention(s): Monitored during session;Premedicated before session    Home Living Family/patient expects to be discharged to:: Private residence Living Arrangements: Spouse/significant other Available Help at Discharge: Family Type of Home: House Home Access: Stairs to enter Entrance Stairs-Rails: Psychiatric nurse of Steps:  4 Home Layout: One level Home Equipment: Crutches;Cane - single point      Prior Function Level of Independence: Independent               Hand Dominance        Extremity/Trunk Assessment   Upper Extremity Assessment Upper Extremity Assessment: Overall WFL for tasks assessed    Lower Extremity Assessment Lower Extremity Assessment: LLE deficits/detail LLE Deficits / Details: able to flex hip sitting       Communication   Communication: No difficulties  Cognition Arousal/Alertness: Awake/alert Behavior During Therapy: WFL for tasks assessed/performed Overall Cognitive Status: Within Functional Limits for tasks assessed                                        General Comments      Exercises Total Joint Exercises Long Arc Quad: AROM;Left;10 reps;Seated   Assessment/Plan    PT Assessment Patient needs continued PT services  PT Problem List Decreased strength;Decreased range of motion;Decreased activity tolerance;Decreased balance;Decreased knowledge of precautions;Decreased mobility       PT Treatment Interventions DME instruction;Balance training;Gait training;Stair training;Functional mobility training;Therapeutic activities;Patient/family education    PT Goals (Current goals can be found in the Care Plan section)  Acute Rehab PT Goals Patient Stated Goal: go home PT Goal  Formulation: With patient/family Time For Goal Achievement: 04/08/17 Potential to Achieve Goals: Good    Frequency 7X/week   Barriers to discharge        Co-evaluation               AM-PAC PT "6 Clicks" Daily Activity  Outcome Measure Difficulty turning over in bed (including adjusting bedclothes, sheets and blankets)?: None Difficulty moving from lying on back to sitting on the side of the bed? : None Difficulty sitting down on and standing up from a chair with arms (e.g., wheelchair, bedside commode, etc,.)?: None Help needed moving to and from a bed to  chair (including a wheelchair)?: None Help needed walking in hospital room?: A Little Help needed climbing 3-5 steps with a railing? : A Little 6 Click Score: 22    End of Session   Activity Tolerance: Patient tolerated treatment well Patient left: in bed Nurse Communication: Mobility status PT Visit Diagnosis: Difficulty in walking, not elsewhere classified (R26.2)    Time: 1505-6979 PT Time Calculation (min) (ACUTE ONLY): 25 min   Charges:   PT Evaluation $PT Eval Low Complexity: 1 Low PT Treatments $Gait Training: 8-22 mins   PT G Codes:       Claretha Cooper 04/07/2017, 10:55 AM  Tresa Endo PT [phone]

## 2017-04-07 NOTE — Progress Notes (Signed)
   Subjective:  Patient reports pain as mild to moderate.  Denies N/V/CP/SOB.  Objective:   VITALS:   Vitals:   04/06/17 1929 04/06/17 2019 04/07/17 0059 04/07/17 0503  BP: 136/82 (!) 146/95 125/80 128/88  Pulse: 72 72 (!) 57 (!) 57  Resp: 17 18 18 16   Temp: 98 F (36.7 C) 98.6 F (37 C) 98.4 F (36.9 C) 98.1 F (36.7 C)  TempSrc: Oral Oral Oral Oral  SpO2: 99% 99% 98% 100%  Weight:      Height:        NAD ABD soft Sensation intact distally Intact pulses distally Dorsiflexion/Plantar flexion intact Incision: dressing C/D/I Compartment soft   Lab Results  Component Value Date   WBC 9.4 04/07/2017   HGB 12.7 (L) 04/07/2017   HCT 36.7 (L) 04/07/2017   MCV 82.8 04/07/2017   PLT 196 04/07/2017   BMET    Component Value Date/Time   NA 136 04/07/2017 0507   K 4.0 04/07/2017 0507   CL 103 04/07/2017 0507   CO2 27 04/07/2017 0507   GLUCOSE 102 (H) 04/07/2017 0507   BUN 8 04/07/2017 0507   CREATININE 0.77 04/07/2017 0507   CALCIUM 8.3 (L) 04/07/2017 0507   GFRNONAA >60 04/07/2017 0507   GFRAA >60 04/07/2017 0507     Assessment/Plan: 1 Day Post-Op   Principal Problem:   Avascular necrosis of bones of both hips ([facility]) Active Problems:   Avascular necrosis of hip, left ([facility])   WBAT with walker ASA, SCDs, TEDs PT/OT PO pain control Dispo: d/c home with HEP, has DME   Latisha Lasch, Horald Pollen 04/07/2017, 7:55 AM   Rod Can, MD Cell [phone]

## 2017-04-19 DIAGNOSIS — Z96642 Presence of left artificial hip joint: Secondary | ICD-10-CM | POA: Diagnosis not present

## 2017-04-19 DIAGNOSIS — Z471 Aftercare following joint replacement surgery: Secondary | ICD-10-CM | POA: Diagnosis not present

## 2017-04-24 ENCOUNTER — Encounter: Payer: Self-pay | Admitting: Internal Medicine

## 2017-04-24 ENCOUNTER — Ambulatory Visit (INDEPENDENT_AMBULATORY_CARE_PROVIDER_SITE_OTHER): Payer: Medicare Other | Admitting: Internal Medicine

## 2017-04-24 ENCOUNTER — Telehealth: Payer: Self-pay | Admitting: Internal Medicine

## 2017-04-24 ENCOUNTER — Other Ambulatory Visit (INDEPENDENT_AMBULATORY_CARE_PROVIDER_SITE_OTHER): Payer: Medicare Other

## 2017-04-24 VITALS — BP 124/80 | HR 60 | Temp 98.0°F | Resp 16 | Ht 73.0 in | Wt 195.0 lb

## 2017-04-24 DIAGNOSIS — Z0000 Encounter for general adult medical examination without abnormal findings: Secondary | ICD-10-CM

## 2017-04-24 DIAGNOSIS — F411 Generalized anxiety disorder: Secondary | ICD-10-CM

## 2017-04-24 DIAGNOSIS — D649 Anemia, unspecified: Secondary | ICD-10-CM

## 2017-04-24 DIAGNOSIS — M87052 Idiopathic aseptic necrosis of left femur: Secondary | ICD-10-CM | POA: Diagnosis not present

## 2017-04-24 DIAGNOSIS — M48061 Spinal stenosis, lumbar region without neurogenic claudication: Secondary | ICD-10-CM

## 2017-04-24 DIAGNOSIS — M87051 Idiopathic aseptic necrosis of right femur: Secondary | ICD-10-CM

## 2017-04-24 LAB — CBC WITH DIFFERENTIAL/PLATELET
Basophils Absolute: 0.1 10*3/uL (ref 0.0–0.1)
Basophils Relative: 1.4 % (ref 0.0–3.0)
Eosinophils Absolute: 0.1 10*3/uL (ref 0.0–0.7)
Eosinophils Relative: 1.7 % (ref 0.0–5.0)
HCT: 39.9 % (ref 39.0–52.0)
Hemoglobin: 13.3 g/dL (ref 13.0–17.0)
Lymphocytes Relative: 34.7 % (ref 12.0–46.0)
Lymphs Abs: 1.6 10*3/uL (ref 0.7–4.0)
MCHC: 33.4 g/dL (ref 30.0–36.0)
MCV: 86.2 fl (ref 78.0–100.0)
Monocytes Absolute: 0.6 10*3/uL (ref 0.1–1.0)
Monocytes Relative: 12.1 % — ABNORMAL HIGH (ref 3.0–12.0)
Neutro Abs: 2.3 10*3/uL (ref 1.4–7.7)
Neutrophils Relative %: 50.1 % (ref 43.0–77.0)
Platelets: 505 10*3/uL — ABNORMAL HIGH (ref 150.0–400.0)
RBC: 4.63 Mil/uL (ref 4.22–5.81)
RDW: 14.5 % (ref 11.5–15.5)
WBC: 4.7 10*3/uL (ref 4.0–10.5)

## 2017-04-24 MED ORDER — HYDROCODONE-ACETAMINOPHEN 10-325 MG PO TABS: 1.0000 | ORAL_TABLET | Freq: Four times a day (QID) | ORAL | 0 refills | Status: DC | PRN

## 2017-04-24 MED ORDER — ALPRAZOLAM 1 MG PO TABS: 1.0000 mg | ORAL_TABLET | Freq: Two times a day (BID) | ORAL | 3 refills | Status: DC | PRN

## 2017-04-24 NOTE — Telephone Encounter (Signed)
Contacted [facility] - they state the interaction between hydrocodone and xanax is: respiratory depression.   Okay for pt to continue therapy.

## 2017-04-24 NOTE — Telephone Encounter (Signed)
Pharmacy informed of same.

## 2017-04-24 NOTE — Patient Instructions (Signed)
Anemia, Nonspecific Anemia is a condition in which the concentration of red blood cells or hemoglobin in the blood is below normal. Hemoglobin is a substance in red blood cells that carries oxygen to the tissues of the body. Anemia results in not enough oxygen reaching these tissues. What are the causes? Common causes of anemia include:  Excessive bleeding. Bleeding may be internal or external. This includes excessive bleeding from periods (in women) or from the intestine.  Poor nutrition.  Chronic kidney, thyroid, and liver disease.  Bone marrow disorders that decrease red blood cell production.  Cancer and treatments for cancer.  HIV, AIDS, and their treatments.  Spleen problems that increase red blood cell destruction.  Blood disorders.  Excess destruction of red blood cells due to infection, medicines, and autoimmune disorders. What are the signs or symptoms?  Minor weakness.  Dizziness.  Headache.  Palpitations.  Shortness of breath, especially with exercise.  Paleness.  Cold sensitivity.  Indigestion.  Nausea.  Difficulty sleeping.  Difficulty concentrating. Symptoms may occur suddenly or they may develop slowly. How is this diagnosed? Additional blood tests are often needed. These help your health care provider determine the best treatment. Your health care provider will check your stool for blood and look for other causes of blood loss. How is this treated? Treatment varies depending on the cause of the anemia. Treatment can include:  Supplements of iron, vitamin B12, or folic acid.  Hormone medicines.  A blood transfusion. This may be needed if blood loss is severe.  Hospitalization. This may be needed if there is significant continual blood loss.  Dietary changes.  Spleen removal. Follow these instructions at home: Keep all follow-up appointments. It often takes many weeks to correct anemia, and having your health care provider check on your  condition and your response to treatment is very important. Get help right away if:  You develop extreme weakness, shortness of breath, or chest pain.  You become dizzy or have trouble concentrating.  You develop heavy vaginal bleeding.  You develop a rash.  You have bloody or black, tarry stools.  You faint.  You vomit up blood.  You vomit repeatedly.  You have abdominal pain.  You have a fever or persistent symptoms for more than 2-3 days.  You have a fever and your symptoms suddenly get worse.  You are dehydrated. This information is not intended to replace advice given to you by your health care provider. Make sure you discuss any questions you have with your health care provider. Document Released: 06/23/2004 Document Revised: 10/28/2015 Document Reviewed: 11/09/2012 Elsevier Interactive Patient Education  2017 [employer]  

## 2017-04-24 NOTE — Progress Notes (Signed)
Subjective:  Patient ID: Dimitri Ped, male    DOB: 1956-05-19  Age: 61 y.o. MRN: [id number]  CC: Anemia   HPI Maximiano L Nagy presents for f/up after recent total hip replacement which was done to treat AVN.  He complains of chronic low back and hip pain.  He requests a refill on hydrocodone.  He also complains of anxiety and says he needs Xanax to help him calm down.  He recently underwent a urine drug screen which was negative for Xanax despite getting a prescription for Xanax. He tells me that he had stopped taking it at the time under the direction of his orthopedic surgeon.  He is practically begging me today for Xanax.  He had a mild anemia perioperatively which needs to be rechecked today.  Outpatient Medications Prior to Visit  Medication Sig Dispense Refill  . aspirin 81 MG chewable tablet Chew 1 tablet (81 mg total) 2 (two) times daily by mouth. 60 tablet 1  . atorvastatin (LIPITOR) 20 MG tablet Take 1 tablet (20 mg total) by mouth daily. 90 tablet 3  . Azelastine-Fluticasone (DYMISTA) 137-50 MCG/ACT SUSP Place 2 Act into the nose 2 (two) times daily. 23 g 11  . Betamethasone Valerate 0.12 % foam APPLY TOPICALLY TO AFFECTED AREA(S) TWICE DAILY (Patient taking differently: APPLY TOPICALLY TO AFFECTED AREA(S) TWICE DAILY AS NEEDED ITCHING) 100 g 2  . DEXILANT 60 MG capsule TAKE 1 CAPSULE (60 MG TOTAL) BY MOUTH DAILY. 30 capsule 11  . dutasteride (AVODART) 0.5 MG capsule Take 1 capsule (0.5 mg total) by mouth daily. 90 capsule 3  . ibuprofen (ADVIL,MOTRIN) 800 MG tablet TAKE ONE TABLET BY MOUTH EVERY 8 HOURS AS NEEDED (Patient taking differently: TAKE ONE TABLET BY MOUTH EVERY 8 HOURS AS NEEDED FOR PAIN) 90 tablet 5  . levocetirizine (XYZAL) 5 MG tablet Take 1 tablet (5 mg total) by mouth every evening. 90 tablet 3  . tamsulosin (FLOMAX) 0.4 MG CAPS capsule Take 1 capsule (0.4 mg total) by mouth daily. 90 capsule 2  . docusate sodium (COLACE) 100 MG capsule Take 1 capsule (100 mg  total) 2 (two) times daily by mouth. 60 capsule 1  . HYDROcodone-acetaminophen (NORCO/VICODIN) 5-325 MG tablet Take 1-2 tablets every 4 (four) hours as needed by mouth for moderate pain ((score 4 to 6)). 50 tablet 0  . ondansetron (ZOFRAN) 4 MG tablet Take 1 tablet (4 mg total) every 6 (six) hours as needed by mouth for nausea. 20 tablet 0  . senna (SENOKOT) 8.6 MG TABS tablet Take 2 tablets (17.2 mg total) at bedtime by mouth. 120 each 0  . ALPRAZolam (XANAX) 1 MG tablet Take 1 mg by mouth 3 (three) times daily as needed for anxiety.   3   No facility-administered medications prior to visit.     ROS Review of Systems  Constitutional: Negative.  Negative for appetite change, diaphoresis, fatigue and unexpected weight change.  HENT: Negative.   Eyes: Negative.  Negative for visual disturbance.  Respiratory: Negative for cough, chest tightness, shortness of breath and wheezing.   Cardiovascular: Negative for chest pain, palpitations and leg swelling.  Gastrointestinal: Negative for abdominal pain, blood in stool, constipation, diarrhea and vomiting.  Endocrine: Negative.   Genitourinary: Negative.   Musculoskeletal: Positive for arthralgias and back pain. Negative for myalgias and neck pain.  Skin: Negative.   Allergic/Immunologic: Negative.   Neurological: Negative.  Negative for dizziness, light-headedness and numbness.  Hematological: Negative for adenopathy. Does not bruise/bleed easily.  Psychiatric/Behavioral: Negative for confusion, decreased concentration, dysphoric mood, self-injury, sleep disturbance and suicidal ideas. The patient is nervous/anxious.     Objective:  BP 124/80 (BP Location: Left Arm, Patient Position: Sitting, Cuff Size: Normal)   Pulse 60   Temp 98 F (36.7 C) (Oral)   Resp 16   Ht 6\' 1"  (1.854 m)   Wt 195 lb (88.5 kg)   SpO2 98%   BMI 25.73 kg/m   BP Readings from Last 3 Encounters:  04/24/17 124/80  04/07/17 138/85  03/31/17 (!) 142/92    Wt  Readings from Last 3 Encounters:  04/24/17 195 lb (88.5 kg)  04/06/17 188 lb (85.3 kg)  03/31/17 188 lb 9.6 oz (85.5 kg)    Physical Exam  Constitutional: He is oriented to person, place, and time. No distress.  HENT:  Mouth/Throat: Oropharynx is clear and moist. No oropharyngeal exudate.  Eyes: Conjunctivae are normal. Right eye exhibits no discharge. Left eye exhibits no discharge. No scleral icterus.  Neck: Normal range of motion. Neck supple. No JVD present. No thyromegaly present.  Cardiovascular: Normal rate, regular rhythm and intact distal pulses. Exam reveals no gallop.  No murmur heard. Pulmonary/Chest: Effort normal and breath sounds normal. No respiratory distress. He has no wheezes. He has no rales. He exhibits no tenderness.  Abdominal: Soft. Bowel sounds are normal. He exhibits no distension and no mass. There is no tenderness. There is no rebound and no guarding.  Musculoskeletal: Normal range of motion. He exhibits no edema, tenderness or deformity.  Neurological: He is alert and oriented to person, place, and time.  Skin: Skin is warm and dry. No rash noted. He is not diaphoretic. No erythema. No pallor.  Psychiatric: He has a normal mood and affect. His behavior is normal. Judgment and thought content normal.  Vitals reviewed.   Lab Results  Component Value Date   WBC 4.7 04/24/2017   HGB 13.3 04/24/2017   HCT 39.9 04/24/2017   PLT 505.0 (H) 04/24/2017   GLUCOSE 102 (H) 04/07/2017   CHOL 208 (H) 09/29/2016   TRIG 120.0 09/29/2016   HDL 53.20 09/29/2016   LDLDIRECT 131.6 07/14/2011   LDLCALC 131 (H) 09/29/2016   ALT 14 09/29/2016   AST 14 09/29/2016   NA 136 04/07/2017   K 4.0 04/07/2017   CL 103 04/07/2017   CREATININE 0.77 04/07/2017   BUN 8 04/07/2017   CO2 27 04/07/2017   TSH 0.75 09/29/2016   PSA 3.31 09/29/2016   INR 0.97 09/08/2014   HGBA1C 5.2 10/13/2014    No results found.  Assessment & Plan:   Aleksa was seen today for  anemia.  Diagnoses and all orders for this visit:  Anemia, unspecified type- His H/H are normal now.  -     CBC with Differential/Platelet; Future  GAD (generalized anxiety disorder)- He is not willing to take an SSRI.  He was advised of the risk of respiratory depression when combining benzodiazepines and opioids.  He agrees to maintain the Xanax at a low dose. -     ALPRAZolam (XANAX) 1 MG tablet; Take 1 tablet (1 mg total) by mouth 2 (two) times daily as needed for anxiety.  Routine general medical examination at a health care facility- he was recently vaccinated for hep A.  Will check his antibody level today to be certain that he has achieved the desired goal. -     Hepatitis A Antibody, Total; Future  Spinal stenosis of lumbar region without neurogenic claudication -  HYDROcodone-acetaminophen (NORCO) 10-325 MG tablet; Take 1 tablet by mouth every 6 (six) hours as needed.  Avascular necrosis of bones of both hips (HCC) -     HYDROcodone-acetaminophen (NORCO) 10-325 MG tablet; Take 1 tablet by mouth every 6 (six) hours as needed.   I have discontinued Decarlo L. Thuman's docusate sodium, HYDROcodone-acetaminophen, ondansetron, and senna. I have also changed his ALPRAZolam. Additionally, I am having him start on HYDROcodone-acetaminophen. Lastly, I am having him maintain his Azelastine-Fluticasone, DEXILANT, tamsulosin, dutasteride, levocetirizine, atorvastatin, ibuprofen, Betamethasone Valerate, and aspirin.  Meds ordered this encounter  Medications  . ALPRAZolam (XANAX) 1 MG tablet    Sig: Take 1 tablet (1 mg total) by mouth 2 (two) times daily as needed for anxiety.    Dispense:  60 tablet    Refill:  3  . HYDROcodone-acetaminophen (NORCO) 10-325 MG tablet    Sig: Take 1 tablet by mouth every 6 (six) hours as needed.    Dispense:  90 tablet    Refill:  0     Follow-up: Return in about 4 months (around 08/22/2017).  Scarlette Calico, MD

## 2017-04-24 NOTE — Telephone Encounter (Signed)
Yes, he has been on this before

## 2017-04-24 NOTE — Telephone Encounter (Signed)
Pharmacy called stating that they are showing that the hydrocodone has an interaction with alprazolam.  Requesting call back.

## 2017-04-25 LAB — HEPATITIS A ANTIBODY, TOTAL: Hepatitis A AB,Total: REACTIVE — AB

## 2017-05-16 DIAGNOSIS — Z471 Aftercare following joint replacement surgery: Secondary | ICD-10-CM | POA: Diagnosis not present

## 2017-05-16 DIAGNOSIS — Z96642 Presence of left artificial hip joint: Secondary | ICD-10-CM | POA: Diagnosis not present

## 2017-05-24 ENCOUNTER — Ambulatory Visit (INDEPENDENT_AMBULATORY_CARE_PROVIDER_SITE_OTHER): Payer: Medicare Other | Admitting: Internal Medicine

## 2017-05-24 ENCOUNTER — Encounter: Payer: Self-pay | Admitting: Internal Medicine

## 2017-05-24 VITALS — BP 120/80 | HR 59 | Temp 97.6°F | Ht 73.0 in | Wt 196.2 lb

## 2017-05-24 DIAGNOSIS — M48061 Spinal stenosis, lumbar region without neurogenic claudication: Secondary | ICD-10-CM

## 2017-05-24 DIAGNOSIS — M87052 Idiopathic aseptic necrosis of left femur: Secondary | ICD-10-CM

## 2017-05-24 DIAGNOSIS — M87051 Idiopathic aseptic necrosis of right femur: Secondary | ICD-10-CM

## 2017-05-24 MED ORDER — HYDROCODONE-ACETAMINOPHEN 10-325 MG PO TABS: 1.0000 | ORAL_TABLET | Freq: Four times a day (QID) | ORAL | 0 refills | Status: DC | PRN

## 2017-05-24 NOTE — Patient Instructions (Signed)

## 2017-05-24 NOTE — Progress Notes (Signed)
Subjective:  Patient ID: Jonathan [name], male    DOB: December 20, 1955  Age: 61 y.o. MRN: [id number]  CC: Back Pain   HPI Jonathan [name] presents for f/up - I recently lowered his daily dose of hydrocodone from 40 mg a day to 30 mg a day because he was taking a benzodiazepine as well.  He returns today to tell me that he wants to stop taking the benzodiazepine and increase the hydrocodone dose back up to 40 mg a day to control his low back pain.  He has persistent low back pain that occasionally radiates into his left lower extremity.  He has decided he wants to see a surgeon to see if there is a surgical tx option for him.  He recently had hip replacement surgery for avascular necrosis and he tells me he has recovered nicely from that.  Outpatient Medications Prior to Visit  Medication Sig Dispense Refill  . aspirin 81 MG chewable tablet Chew 1 tablet (81 mg total) 2 (two) times daily by mouth. 60 tablet 1  . atorvastatin (LIPITOR) 20 MG tablet Take 1 tablet (20 mg total) by mouth daily. 90 tablet 3  . Azelastine-Fluticasone (DYMISTA) 137-50 MCG/ACT SUSP Place 2 Act into the nose 2 (two) times daily. 23 g 11  . Betamethasone Valerate 0.12 % foam APPLY TOPICALLY TO AFFECTED AREA(S) TWICE DAILY (Patient taking differently: APPLY TOPICALLY TO AFFECTED AREA(S) TWICE DAILY AS NEEDED ITCHING) 100 g 2  . DEXILANT 60 MG capsule TAKE 1 CAPSULE (60 MG TOTAL) BY MOUTH DAILY. 30 capsule 11  . dutasteride (AVODART) 0.5 MG capsule Take 1 capsule (0.5 mg total) by mouth daily. 90 capsule 3  . ibuprofen (ADVIL,MOTRIN) 800 MG tablet TAKE ONE TABLET BY MOUTH EVERY 8 HOURS AS NEEDED (Patient taking differently: TAKE ONE TABLET BY MOUTH EVERY 8 HOURS AS NEEDED FOR PAIN) 90 tablet 5  . levocetirizine (XYZAL) 5 MG tablet Take 1 tablet (5 mg total) by mouth every evening. 90 tablet 3  . tamsulosin (FLOMAX) 0.4 MG CAPS capsule Take 1 capsule (0.4 mg total) by mouth daily. 90 capsule 2  . HYDROcodone-acetaminophen  (NORCO) 10-325 MG tablet Take 1 tablet by mouth every 6 (six) hours as needed. 90 tablet 0  . ALPRAZolam (XANAX) 1 MG tablet Take 1 tablet (1 mg total) by mouth 2 (two) times daily as needed for anxiety. (Patient not taking: Reported on 05/24/2017) 60 tablet 3   No facility-administered medications prior to visit.     ROS Review of Systems  Constitutional: Negative.   HENT: Negative.   Eyes: Negative.   Respiratory: Negative for cough, chest tightness, shortness of breath and wheezing.   Cardiovascular: Negative for chest pain, palpitations and leg swelling.  Gastrointestinal: Negative for abdominal pain, constipation, diarrhea and vomiting.  Endocrine: Negative.   Genitourinary: Negative.   Musculoskeletal: Positive for back pain. Negative for arthralgias, myalgias and neck pain.  Skin: Negative.   Neurological: Negative.  Negative for dizziness, weakness and numbness.  Hematological: Negative for adenopathy. Does not bruise/bleed easily.  Psychiatric/Behavioral: Negative.  Negative for dysphoric mood, sleep disturbance and suicidal ideas. The patient is not nervous/anxious.     Objective:  BP 120/80 (BP Location: Left Arm, Patient Position: Sitting, Cuff Size: Normal)   Pulse (!) 59   Temp 97.6 F (36.4 C) (Oral)   Ht 6\' 1"  (1.854 m)   Wt 196 lb 4 oz (89 kg)   SpO2 99%   BMI 25.89 kg/m   BP Readings  from Last 3 Encounters:  05/24/17 120/80  04/24/17 124/80  04/07/17 138/85    Wt Readings from Last 3 Encounters:  05/24/17 196 lb 4 oz (89 kg)  04/24/17 195 lb (88.5 kg)  04/06/17 188 lb (85.3 kg)    Physical Exam  Constitutional: He is oriented to person, place, and time. No distress.  HENT:  Mouth/Throat: Oropharynx is clear and moist. No oropharyngeal exudate.  Eyes: Conjunctivae are normal. Left eye exhibits no discharge. No scleral icterus.  Neck: Normal range of motion. Neck supple. No JVD present. No thyromegaly present.  Cardiovascular: Normal rate, regular  rhythm and normal heart sounds.  No murmur heard. Pulmonary/Chest: Effort normal and breath sounds normal. No respiratory distress. He has no rales.  Abdominal: Soft. Bowel sounds are normal. He exhibits no mass. There is no tenderness. There is no guarding.  Musculoskeletal: Normal range of motion. He exhibits no edema, tenderness or deformity.  Lymphadenopathy:    He has no cervical adenopathy.  Neurological: He is alert and oriented to person, place, and time. He has normal reflexes. He displays no atrophy and normal reflexes. No cranial nerve deficit or sensory deficit. He exhibits normal muscle tone. Coordination normal.  Neg SLR in BLE  Skin: Skin is warm and dry. No rash noted. He is not diaphoretic. No erythema. No pallor.  Psychiatric: He has a normal mood and affect. His behavior is normal. Judgment and thought content normal.  Vitals reviewed.   Lab Results  Component Value Date   WBC 4.7 04/24/2017   HGB 13.3 04/24/2017   HCT 39.9 04/24/2017   PLT 505.0 (H) 04/24/2017   GLUCOSE 102 (H) 04/07/2017   CHOL 208 (H) 09/29/2016   TRIG 120.0 09/29/2016   HDL 53.20 09/29/2016   LDLDIRECT 131.6 07/14/2011   LDLCALC 131 (H) 09/29/2016   ALT 14 09/29/2016   AST 14 09/29/2016   NA 136 04/07/2017   K 4.0 04/07/2017   CL 103 04/07/2017   CREATININE 0.77 04/07/2017   BUN 8 04/07/2017   CO2 27 04/07/2017   TSH 0.75 09/29/2016   PSA 3.31 09/29/2016   INR 0.97 09/08/2014   HGBA1C 5.2 10/13/2014    No results found.  Assessment & Plan:   Lea was seen today for back pain.  Diagnoses and all orders for this visit:  Spinal stenosis of lumbar region without neurogenic claudication -     HYDROcodone-acetaminophen (NORCO) 10-325 MG tablet; Take 1 tablet by mouth every 6 (six) hours as needed. -     Ambulatory referral to Orthopedic Surgery  Avascular necrosis of bones of both hips ([place])- his hip pain has resolved   I have discontinued Thomos L. Kath's ALPRAZolam. I  am also having him maintain his Azelastine-Fluticasone, DEXILANT, tamsulosin, dutasteride, levocetirizine, atorvastatin, ibuprofen, Betamethasone Valerate, aspirin, and HYDROcodone-acetaminophen.  Meds ordered this encounter  Medications  . HYDROcodone-acetaminophen (NORCO) 10-325 MG tablet    Sig: Take 1 tablet by mouth every 6 (six) hours as needed.    Dispense:  120 tablet    Refill:  0     Follow-up: Return in about 4 months (around 09/22/2017).  Scarlette Calico, MD

## 2017-06-12 ENCOUNTER — Encounter: Payer: Self-pay | Admitting: Internal Medicine

## 2017-06-15 ENCOUNTER — Emergency Department (HOSPITAL_COMMUNITY)
Admission: EM | Admit: 2017-06-15 | Discharge: 2017-06-15 | Disposition: A | Discharge status: Other Acute Inpatient Hospital | Payer: Self-pay

## 2017-06-15 DIAGNOSIS — R030 Elevated blood-pressure reading, without diagnosis of hypertension: Secondary | ICD-10-CM | POA: Diagnosis not present

## 2017-06-15 DIAGNOSIS — R0981 Nasal congestion: Secondary | ICD-10-CM | POA: Diagnosis not present

## 2017-06-15 DIAGNOSIS — R51 Headache: Secondary | ICD-10-CM | POA: Diagnosis not present

## 2017-06-19 ENCOUNTER — Telehealth: Payer: Self-pay | Admitting: Internal Medicine

## 2017-06-19 NOTE — Telephone Encounter (Signed)
Copied from [place]. Topic: Quick Communication - See Telephone Encounter >> Jun 19, 2017  1:14 PM Ether Griffins B wrote: CRM for notification. See Telephone encounter for:  Pt needing [facility].

## 2017-06-19 NOTE — Telephone Encounter (Signed)
Sent early due to refill policy.

## 2017-06-20 ENCOUNTER — Other Ambulatory Visit: Payer: Self-pay | Admitting: Internal Medicine

## 2017-06-23 ENCOUNTER — Telehealth: Payer: Self-pay | Admitting: Internal Medicine

## 2017-06-23 ENCOUNTER — Other Ambulatory Visit: Payer: Self-pay | Admitting: Internal Medicine

## 2017-06-23 DIAGNOSIS — M48061 Spinal stenosis, lumbar region without neurogenic claudication: Secondary | ICD-10-CM

## 2017-06-23 MED ORDER — HYDROCODONE-ACETAMINOPHEN 10-325 MG PO TABS: 1.0000 | ORAL_TABLET | Freq: Four times a day (QID) | ORAL | 0 refills | Status: DC | PRN

## 2017-06-23 NOTE — Telephone Encounter (Signed)
Copied from [place] [id number]. Topic: Quick Communication - Rx Refill/Question >> Jun 23, 2017  1:46 PM Lolita Rieger, [place] wrote: Medication: oxycodone    Has the patient contacted their pharmacy? yes   (Agent: If no, request that the patient contact the pharmacy for the refill.)   Preferred Pharm[facility] (with phone number [facility] street [facility] [address]   Agent: Please be advised that RX refills may take up to 3 business days. We ask that you follow-up with your pharmacy.

## 2017-07-05 DIAGNOSIS — N401 Enlarged prostate with lower urinary tract symptoms: Secondary | ICD-10-CM | POA: Diagnosis not present

## 2017-07-05 DIAGNOSIS — R972 Elevated prostate specific antigen [PSA]: Secondary | ICD-10-CM | POA: Diagnosis not present

## 2017-07-05 LAB — PSA: PSA: 1.53

## 2017-07-17 DIAGNOSIS — M5136 Other intervertebral disc degeneration, lumbar region: Secondary | ICD-10-CM | POA: Diagnosis not present

## 2017-07-17 DIAGNOSIS — M961 Postlaminectomy syndrome, not elsewhere classified: Secondary | ICD-10-CM | POA: Diagnosis not present

## 2017-07-19 ENCOUNTER — Telehealth: Payer: Self-pay

## 2017-07-19 ENCOUNTER — Other Ambulatory Visit: Payer: Self-pay | Admitting: Internal Medicine

## 2017-07-19 IMAGING — MR MR KNEE*L* W/O CM
5 of 6 series · 34 of 40 positions shown · non-contrast
Comparison: Radiographs from 06/30/2016.

CLINICAL DATA: Left knee pain and weakness for 6 months.

EXAM:
MRI OF THE LEFT KNEE WITHOUT CONTRAST
TECHNIQUE: Multiplanar, multisequence MR imaging of the knee was performed. No
intravenous contrast was administered.

[Series 3: PD · axial · 4.0mm · 0.42mm/px · z∈[-83,+37]mm · 8 of 25 slices shown (1 of 2)]
[im 1/25]
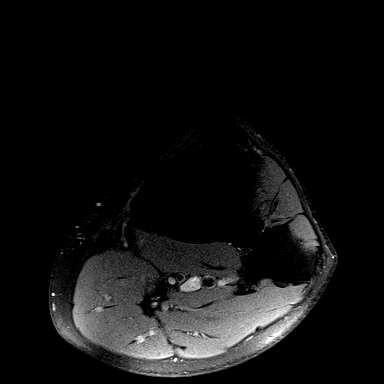
[im 4/25]
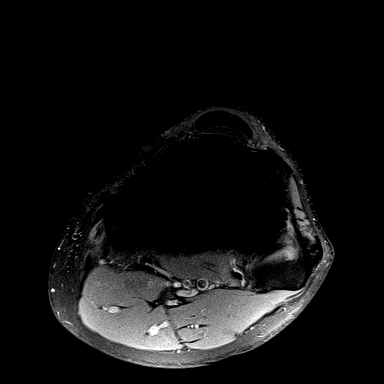
[im 7/25]
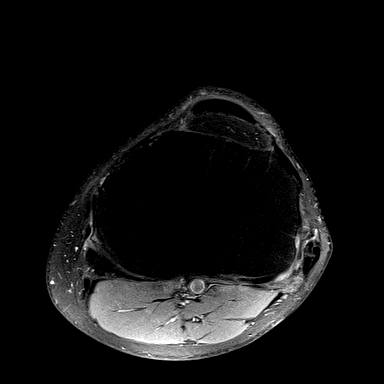
[im 11/25]
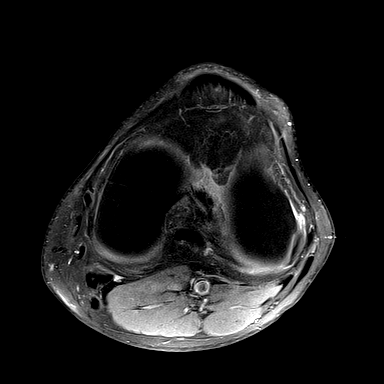
[im 14/25]
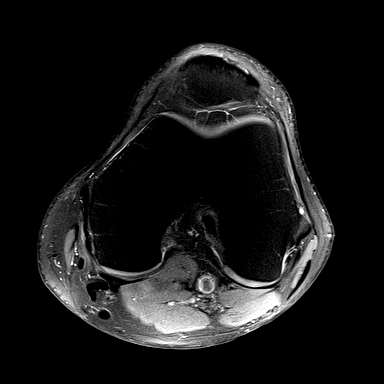
[im 18/25]
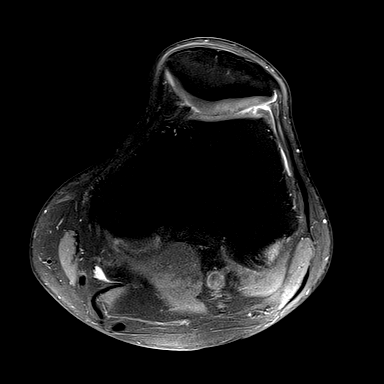
[im 21/25]
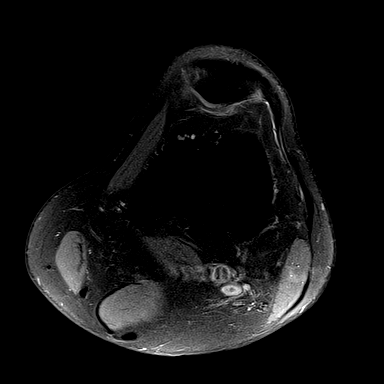
[im 25/25]
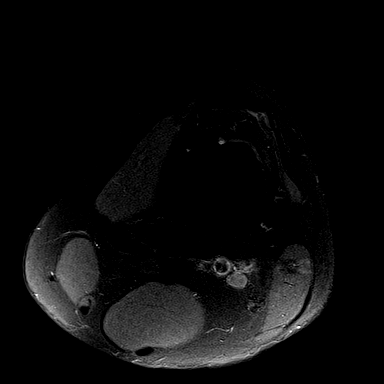

[Series 4: PD · coronal · 4.0mm · 0.44mm/px · 6 of 20 slices shown (2 of 2)]
[im 1/20]
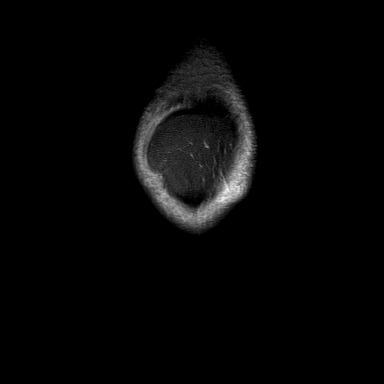
[im 4/20]
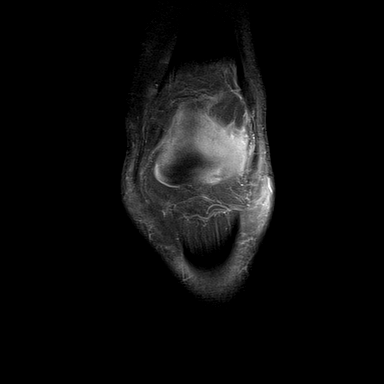
[im 8/20]
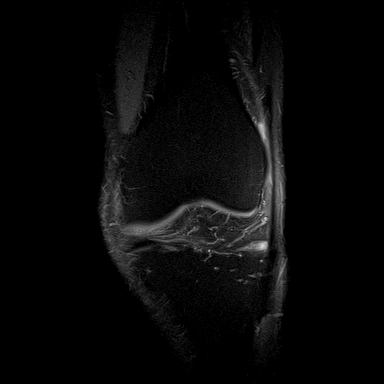
[im 12/20]
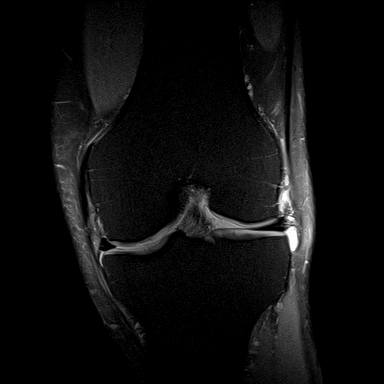
[im 16/20]
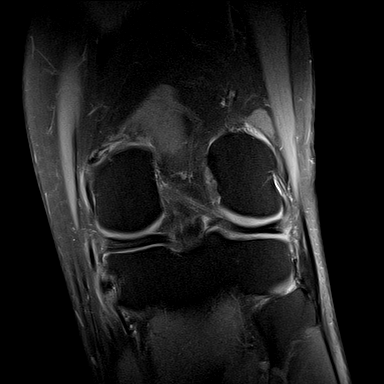
[im 20/20]
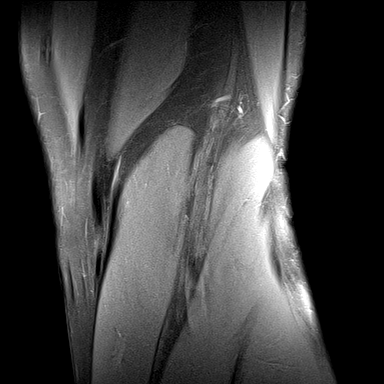

[Series 5: (id) · coronal · 4.0mm · 0.44mm/px · 6 of 20 slices shown]
[im 1/20]
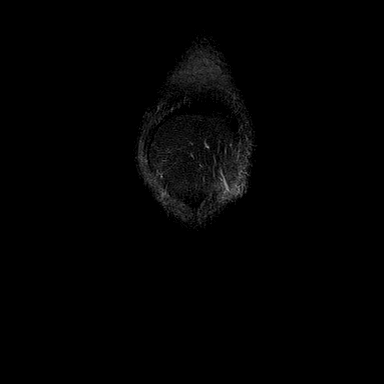
[im 4/20]
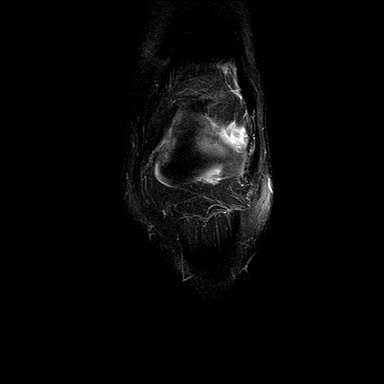
[im 8/20]
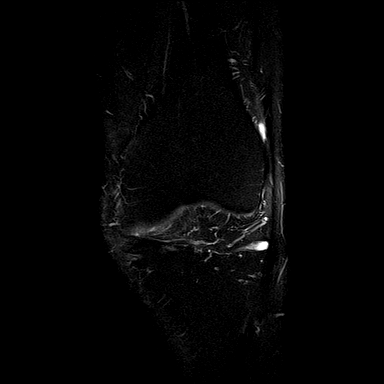
[im 12/20]
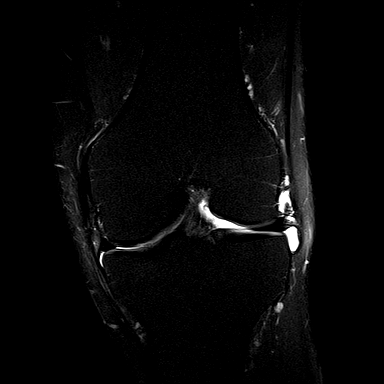
[im 16/20]
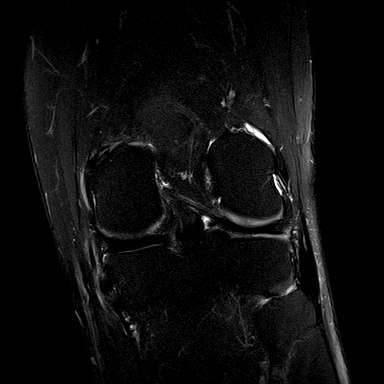
[im 20/20]
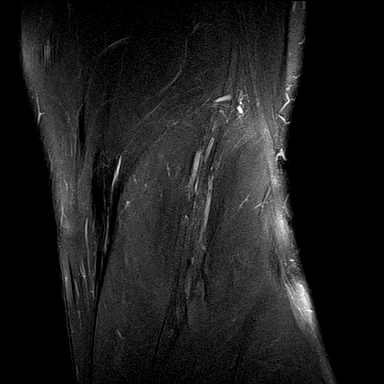

[Series 6: PD fat-sat · sagittal · 4.0mm · 0.42mm/px · 8 of 24 slices shown]
[im 1/24]
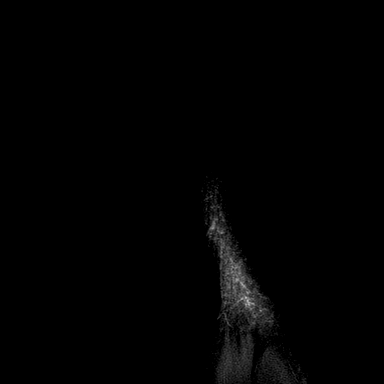
[im 4/24]
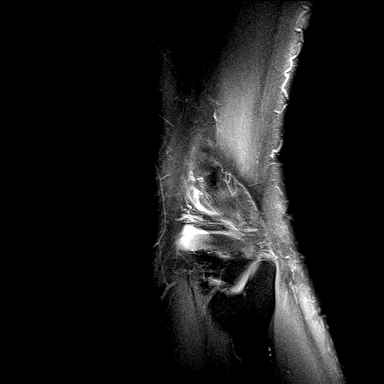
[im 7/24]
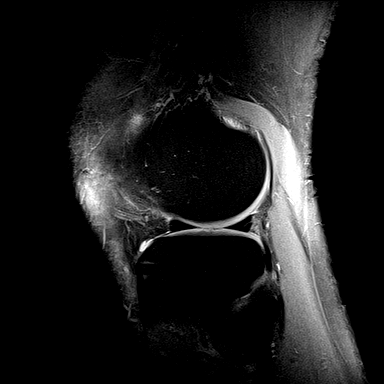
[im 10/24]
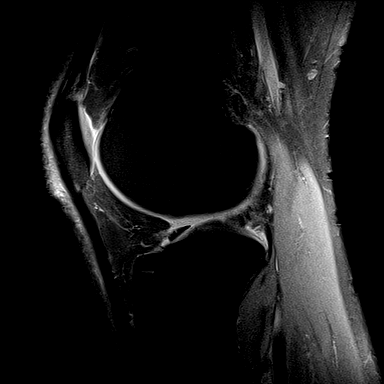
[im 14/24]
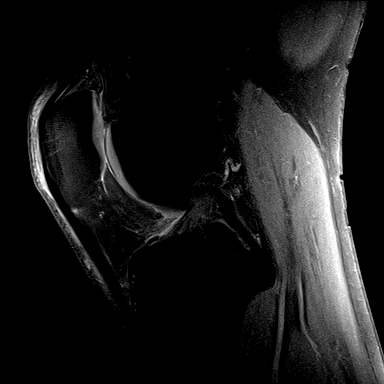
[im 17/24]
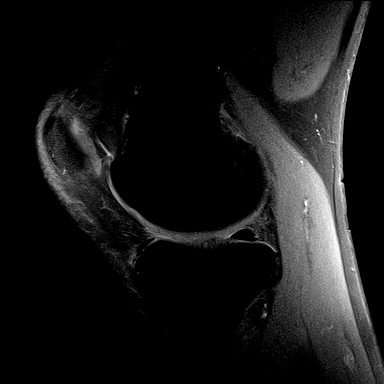
[im 20/24]
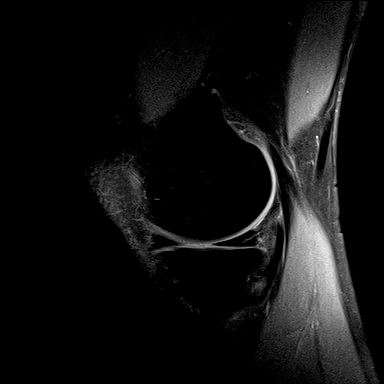
[im 24/24]
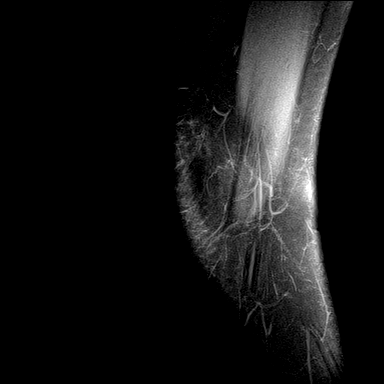

[Series 7: T1 · coronal · 4.0mm · 0.44mm/px · 6 of 20 slices shown]
[im 1/20]
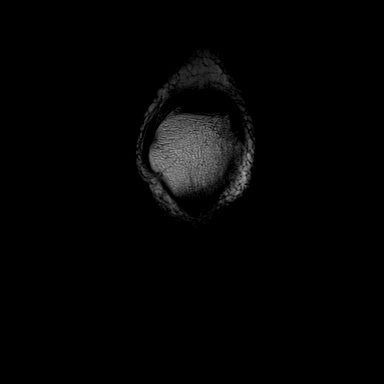
[im 4/20]
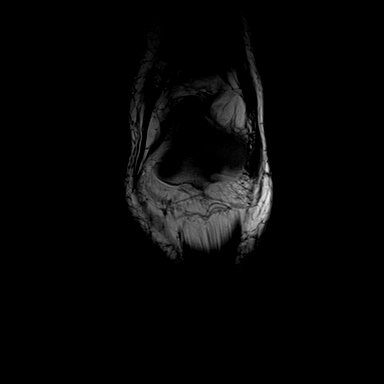
[im 8/20]
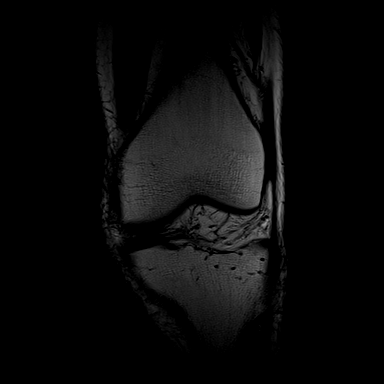
[im 12/20]
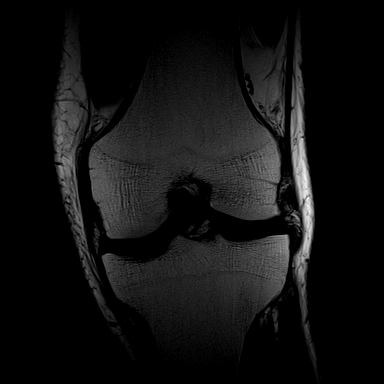
[im 16/20]
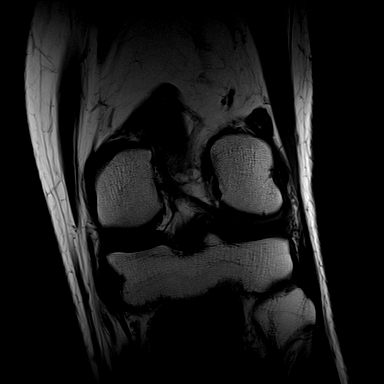
[im 20/20]
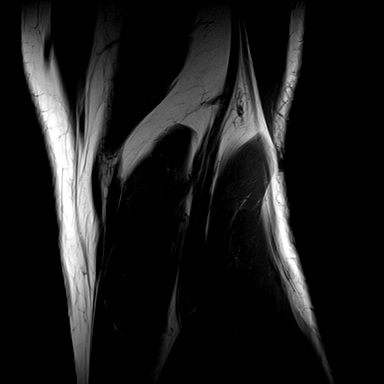

[34 of 40 positions shown; findings below may reference images not displayed]

FINDINGS: MENISCI

Medial meniscus: Mild linear grade 2 signal in the posterior horn
extends to the periphery but without visible surface extension.

Lateral meniscus: Linear signal in the periphery of the lateral
meniscus does not extend to a meniscal surface.

LIGAMENTS

Cruciates:  Unremarkable

Collaterals: Mild proximal popliteus tendinopathy. Mild central
irregularity of the MCL is observed and I suspect this is probably
the residua of old trauma. No surrounding edema.

CARTILAGE

Patellofemoral:  Unremarkable

Medial: Mild chondral heterogeneity suggesting minimal
chondromalacia, but well preserved chondral thickness.

Lateral:  Unremarkable

Joint:  Unremarkable

Popliteal Fossa:  Small Baker's cyst.

Extensor Mechanism:  Minimal prepatellar subcutaneous edema.

Bones: No significant extra-articular osseous abnormalities
identified.

Other: No supplemental non-categorized findings.
IMPRESSION: 1. Mild proximal popliteus tendinopathy.
2. Suspected old MCL injury with mild irregularity of the central
MCL at the joint line. I doubt that this is acute, there is no
surrounding edema.
3. Minimal chondromalacia in the medial compartment but otherwise
unusually well preserved articular cartilage for age.
4. Small Baker's cyst.
5. Minimal prepatellar subcutaneous edema.

## 2017-07-19 NOTE — Telephone Encounter (Signed)
Pt rq Hydrocodone refill. Please advise

## 2017-07-19 NOTE — Telephone Encounter (Signed)
Copied from [place] [phone]. Topic: Quick Communication - See Telephone Encounter >> Jul 19, 2017 12:58 PM Oneta Rack wrote: CRM for notification. See Telephone encounter for:   07/19/17.   Relation to pt: self  Call back [id number] [place], [place]. [phone] (Phone) [phone] (Fax)  Reason for call:   Patient aware he's not currently due, requesting HYDROcodone-acetaminophen ([place]) 10-325 MG tablet, patient contacted pharmacy but wanted to ensure PCP was made aware, please advise >> Jul 19, 2017  1:01 PM Oneta Rack wrote: CRM for notification. See Telephone encounter for:   07/19/17.   Relation to pt: self  Call back [place]: [id number] [place], [place]. [phone] (Phone) [phone] (Fax)  Reason for call:   Patient aware he's not currently due, requesting HYDROcodone-acetaminophen ([place]) 10-325 MG tablet, patient contacted pharmacy but wanted to ensure PCP was made aware, please advise

## 2017-07-20 DIAGNOSIS — M961 Postlaminectomy syndrome, not elsewhere classified: Secondary | ICD-10-CM | POA: Diagnosis not present

## 2017-07-20 DIAGNOSIS — M5136 Other intervertebral disc degeneration, lumbar region: Secondary | ICD-10-CM | POA: Diagnosis not present

## 2017-07-23 ENCOUNTER — Other Ambulatory Visit: Payer: Self-pay | Admitting: Internal Medicine

## 2017-07-23 DIAGNOSIS — M48061 Spinal stenosis, lumbar region without neurogenic claudication: Secondary | ICD-10-CM

## 2017-07-23 MED ORDER — HYDROCODONE-ACETAMINOPHEN 10-325 MG PO TABS
1.0000 | ORAL_TABLET | Freq: Four times a day (QID) | ORAL | 0 refills | Status: DC | PRN
Start: 2017-07-23 — End: 2017-08-21

## 2017-08-07 ENCOUNTER — Telehealth: Payer: Self-pay | Admitting: Internal Medicine

## 2017-08-07 DIAGNOSIS — K2100 Gastro-esophageal reflux disease with esophagitis, without bleeding: Secondary | ICD-10-CM

## 2017-08-07 DIAGNOSIS — R3912 Poor urinary stream: Secondary | ICD-10-CM | POA: Diagnosis not present

## 2017-08-07 DIAGNOSIS — N401 Enlarged prostate with lower urinary tract symptoms: Secondary | ICD-10-CM | POA: Diagnosis not present

## 2017-08-07 MED ORDER — DEXLANSOPRAZOLE 60 MG PO CPDR: 60.0000 mg | DELAYED_RELEASE_CAPSULE | Freq: Every day | ORAL | 11 refills | Status: DC

## 2017-08-07 NOTE — Telephone Encounter (Signed)
Copied from [place] [id number]. Topic: Quick Communication - Rx Refill/Question >> Aug 07, 2017  9:11 AM Aurelio Brash B wrote: Medication: DEXILANT 60 MG capsule   Has the patient contacted their pharmacy? yes   (Agent: If no, request that the patient contact the pharmacy for the refill.)   Preferred Pharmacy (with phone number or street name): [id number] [place], [place]. [phone] (Phone) [phone] (Fax)    Agent: Please be advised that RX refills may take up to 3 business days. We ask that you follow-up with your pharmacy.

## 2017-08-07 NOTE — Telephone Encounter (Signed)
Contacted pt regarding prescription refill request; per the pharmacy he has no refills; refills sent to [facility]; also pt scheduled 4 month follow up appointment with Dr Alex Gardener at [facility] on Tuesday September 19, 2017 at 0830; pt verbalizes understanding.

## 2017-08-16 ENCOUNTER — Other Ambulatory Visit: Payer: Self-pay | Admitting: Internal Medicine

## 2017-08-16 NOTE — Telephone Encounter (Signed)
Copied from [place] [phone]. Topic: Quick Communication - Rx Refill/Question >> Aug 16, 2017  1:50 PM Lolita Rieger, [place] wrote: Medication: hydrocodone 10-325 mg   Has the patient contacted their pharmacy? yes   (Agent: If no, request that the patient contact the pharmacy for the refill.)   Preferred Pharm[facility] (with phone number [facility] street name[facility] [address] [place]   Agent: Please be advised that RX refills may take up to 3 business days. We ask that you follow-up with your pharmacy.

## 2017-08-16 NOTE — Telephone Encounter (Signed)
Hydrocodone 10-325 refill Last OV: 05/24/17 PCP: Dr Cathlean Cower Last Refill: 07/23/16 [facility] [place] [place]

## 2017-08-17 NOTE — Telephone Encounter (Signed)
Check [place] registry last filled 07/23/2017...Jonathan [name]

## 2017-08-21 ENCOUNTER — Other Ambulatory Visit: Payer: Self-pay | Admitting: Internal Medicine

## 2017-08-21 DIAGNOSIS — M48061 Spinal stenosis, lumbar region without neurogenic claudication: Secondary | ICD-10-CM

## 2017-08-21 MED ORDER — HYDROCODONE-ACETAMINOPHEN 10-325 MG PO TABS
1.0000 | ORAL_TABLET | Freq: Four times a day (QID) | ORAL | 0 refills | Status: DC | PRN
Start: 2017-08-21 — End: 2017-09-19

## 2017-09-06 DIAGNOSIS — J322 Chronic ethmoidal sinusitis: Secondary | ICD-10-CM | POA: Diagnosis not present

## 2017-09-06 DIAGNOSIS — J301 Allergic rhinitis due to pollen: Secondary | ICD-10-CM | POA: Diagnosis not present

## 2017-09-06 DIAGNOSIS — J370 Chronic laryngitis: Secondary | ICD-10-CM | POA: Diagnosis not present

## 2017-09-06 DIAGNOSIS — J320 Chronic maxillary sinusitis: Secondary | ICD-10-CM | POA: Diagnosis not present

## 2017-09-13 ENCOUNTER — Other Ambulatory Visit: Payer: Self-pay | Admitting: Internal Medicine

## 2017-09-13 DIAGNOSIS — N401 Enlarged prostate with lower urinary tract symptoms: Principal | ICD-10-CM

## 2017-09-13 DIAGNOSIS — J322 Chronic ethmoidal sinusitis: Secondary | ICD-10-CM | POA: Diagnosis not present

## 2017-09-13 DIAGNOSIS — J320 Chronic maxillary sinusitis: Secondary | ICD-10-CM | POA: Diagnosis not present

## 2017-09-13 DIAGNOSIS — J301 Allergic rhinitis due to pollen: Secondary | ICD-10-CM | POA: Diagnosis not present

## 2017-09-13 DIAGNOSIS — N138 Other obstructive and reflux uropathy: Secondary | ICD-10-CM

## 2017-09-19 ENCOUNTER — Other Ambulatory Visit (INDEPENDENT_AMBULATORY_CARE_PROVIDER_SITE_OTHER): Payer: Medicare Other

## 2017-09-19 ENCOUNTER — Encounter: Payer: Self-pay | Admitting: Internal Medicine

## 2017-09-19 ENCOUNTER — Ambulatory Visit (INDEPENDENT_AMBULATORY_CARE_PROVIDER_SITE_OTHER): Payer: Medicare Other | Admitting: Internal Medicine

## 2017-09-19 VITALS — BP 138/86 | HR 94 | Temp 97.9°F | Resp 14 | Ht 73.0 in | Wt 192.8 lb

## 2017-09-19 DIAGNOSIS — E785 Hyperlipidemia, unspecified: Secondary | ICD-10-CM

## 2017-09-19 DIAGNOSIS — D473 Essential (hemorrhagic) thrombocythemia: Secondary | ICD-10-CM

## 2017-09-19 DIAGNOSIS — Z23 Encounter for immunization: Secondary | ICD-10-CM | POA: Diagnosis not present

## 2017-09-19 DIAGNOSIS — M48061 Spinal stenosis, lumbar region without neurogenic claudication: Secondary | ICD-10-CM | POA: Diagnosis not present

## 2017-09-19 DIAGNOSIS — D75839 Thrombocytosis, unspecified: Secondary | ICD-10-CM | POA: Insufficient documentation

## 2017-09-19 LAB — CBC WITH DIFFERENTIAL/PLATELET
Basophils Absolute: 0 10*3/uL (ref 0.0–0.1)
Basophils Relative: 0.8 % (ref 0.0–3.0)
Eosinophils Absolute: 0 10*3/uL (ref 0.0–0.7)
Eosinophils Relative: 1.5 % (ref 0.0–5.0)
HCT: 44.9 % (ref 39.0–52.0)
Hemoglobin: 15.3 g/dL (ref 13.0–17.0)
Lymphocytes Relative: 50.9 % — ABNORMAL HIGH (ref 12.0–46.0)
Lymphs Abs: 1.5 10*3/uL (ref 0.7–4.0)
MCHC: 34.1 g/dL (ref 30.0–36.0)
MCV: 82.9 fl (ref 78.0–100.0)
Monocytes Absolute: 0.4 10*3/uL (ref 0.1–1.0)
Monocytes Relative: 13.3 % — ABNORMAL HIGH (ref 3.0–12.0)
Neutro Abs: 1 10*3/uL — ABNORMAL LOW (ref 1.4–7.7)
Neutrophils Relative %: 33.5 % — ABNORMAL LOW (ref 43.0–77.0)
Platelets: 275 10*3/uL (ref 150.0–400.0)
RBC: 5.42 Mil/uL (ref 4.22–5.81)
RDW: 15.4 % (ref 11.5–15.5)
WBC: 2.9 10*3/uL — ABNORMAL LOW (ref 4.0–10.5)

## 2017-09-19 LAB — FOLATE: Folate: 12.9 ng/mL (ref >5.9)

## 2017-09-19 LAB — LIPID PANEL
Cholesterol: 205 mg/dL — ABNORMAL HIGH (ref 0–200)
HDL: 52.2 mg/dL (ref >39.00)
LDL Cholesterol: 137 mg/dL — ABNORMAL HIGH (ref 0–99)
NonHDL: 152.79
Total CHOL/HDL Ratio: 4
Triglycerides: 79 mg/dL (ref 0.0–149.0)
VLDL: 15.8 mg/dL (ref 0.0–40.0)

## 2017-09-19 LAB — VITAMIN B12: Vitamin B-12: 261 pg/mL (ref 211–911)

## 2017-09-19 MED ORDER — PITAVASTATIN CALCIUM 2 MG PO TABS: 1.0000 | ORAL_TABLET | Freq: Every day | ORAL | 1 refills | Status: DC

## 2017-09-19 MED ORDER — HYDROCODONE-ACETAMINOPHEN 10-325 MG PO TABS: 1.0000 | ORAL_TABLET | Freq: Four times a day (QID) | ORAL | 0 refills | Status: DC | PRN

## 2017-09-19 NOTE — Patient Instructions (Signed)

## 2017-09-19 NOTE — Progress Notes (Signed)
Subjective:  Patient ID: Jonathan [name], male    DOB: [birth year]  Age: 62 y.o. MRN: [id number]  CC: Back Pain; Osteoarthritis; and Hyperlipidemia   HPI Jonathan [name] presents for f/up - He is not taking atorvastatin because he says about 6 months ago it caused side pain.  He does complain of chronic low back pain and tells me he has seen a surgeon and surgery has been recommended.  In the meantime he wants to control the pain with hydrocodone, ibuprofen, and acetaminophen.  He denies any recent paresthesias. He is also due for follow-up on his platelet count.  About 4 months ago it was up to 500.  He denies episodes of bleeding or bruising.  Outpatient Medications Prior to Visit  Medication Sig Dispense Refill  . Betamethasone Valerate 0.12 % foam APPLY TOPICALLY TO AFFECTED AREA(S) TWICE DAILY (Patient taking differently: APPLY TOPICALLY TO AFFECTED AREA(S) TWICE DAILY AS NEEDED ITCHING) 100 g 2  . dexlansoprazole (DEXILANT) 60 MG capsule Take 1 capsule (60 mg total) by mouth daily. 30 capsule 11  . dutasteride (AVODART) 0.5 MG capsule TAKE 1 CAPSULE (0.5 MG TOTAL) BY MOUTH DAILY. 90 capsule 1  . ibuprofen (ADVIL,MOTRIN) 800 MG tablet TAKE ONE TABLET BY MOUTH EVERY 8 HOURS AS NEEDED (Patient taking differently: TAKE ONE TABLET BY MOUTH EVERY 8 HOURS AS NEEDED FOR PAIN) 90 tablet 5  . tamsulosin (FLOMAX) 0.4 MG CAPS capsule Take 1 capsule (0.4 mg total) by mouth daily. 90 capsule 2  . aspirin 81 MG chewable tablet Chew 1 tablet (81 mg total) 2 (two) times daily by mouth. 60 tablet 1  . atorvastatin (LIPITOR) 20 MG tablet Take 1 tablet (20 mg total) by mouth daily. 90 tablet 3  . Azelastine-Fluticasone (DYMISTA) 137-50 MCG/ACT SUSP Place 2 Act into the nose 2 (two) times daily. 23 g 11  . HYDROcodone-acetaminophen (NORCO) 10-325 MG tablet Take 1 tablet by mouth every 6 (six) hours as needed. 120 tablet 0  . levocetirizine (XYZAL) 5 MG tablet Take 1 tablet (5 mg total) by mouth every  evening. 90 tablet 3   No facility-administered medications prior to visit.     ROS Review of Systems  Constitutional: Negative.  Negative for diaphoresis and fatigue.  HENT: Negative.  Negative for nosebleeds, sinus pressure, sore throat and trouble swallowing.   Eyes: Negative for visual disturbance.  Respiratory: Negative for cough, chest tightness, shortness of breath and wheezing.   Cardiovascular: Negative for chest pain, palpitations and leg swelling.  Gastrointestinal: Negative for abdominal pain, constipation, diarrhea, nausea and vomiting.  Endocrine: Negative.   Genitourinary: Negative.  Negative for difficulty urinating, dysuria and hematuria.  Musculoskeletal: Positive for arthralgias and back pain. Negative for myalgias.  Skin: Negative for color change, pallor and rash.  Neurological: Negative.  Negative for dizziness, weakness and light-headedness.  Hematological: Negative for adenopathy. Does not bruise/bleed easily.  Psychiatric/Behavioral: Negative.  Negative for decreased concentration, dysphoric mood and sleep disturbance. The patient is not nervous/anxious.     Objective:  BP 138/86 (BP Location: Left Arm, Patient Position: Sitting, Cuff Size: Normal)   Pulse 94   Temp 97.9 F (36.6 C) (Oral)   Resp 14   Ht 6\' 1"  (1.854 m)   Wt 192 lb 12 oz (87.4 kg)   BMI 25.43 kg/m   BP Readings from Last 3 Encounters:  09/19/17 138/86  05/24/17 120/80  04/24/17 124/80    Wt Readings from Last 3 Encounters:  09/19/17 192 lb 12 oz (  87.4 kg)  05/24/17 196 lb 4 oz (89 kg)  04/24/17 195 lb (88.5 kg)    Physical Exam  Constitutional: He is oriented to person, place, and time. No distress.  HENT:  Mouth/Throat: Oropharynx is clear and moist. No oropharyngeal exudate.  Eyes: Conjunctivae are normal.  Neck: Normal range of motion. Neck supple. No JVD present. No thyromegaly present.  Cardiovascular: Normal rate and normal heart sounds. Exam reveals no gallop and no  friction rub.  No murmur heard. Pulmonary/Chest: Effort normal and breath sounds normal. No stridor. No respiratory distress. He has no wheezes. He has no rales.  Abdominal: Soft. Bowel sounds are normal. He exhibits no distension and no mass. There is no tenderness. There is no guarding.  Musculoskeletal: Normal range of motion. He exhibits no edema, tenderness or deformity.  Lymphadenopathy:    He has no cervical adenopathy.  Neurological: He is alert and oriented to person, place, and time.  Skin: Skin is warm and dry. No rash noted. He is not diaphoretic. No erythema.  Vitals reviewed.   Lab Results  Component Value Date   WBC 2.9 Repeated and verified X2. (L) 09/19/2017   HGB 15.3 09/19/2017   HCT 44.9 09/19/2017   PLT 275.0 09/19/2017   GLUCOSE 102 (H) 04/07/2017   CHOL 205 (H) 09/19/2017   TRIG 79.0 09/19/2017   HDL 52.20 09/19/2017   LDLDIRECT 131.6 07/14/2011   LDLCALC 137 (H) 09/19/2017   ALT 14 09/29/2016   AST 14 09/29/2016   NA 136 04/07/2017   K 4.0 04/07/2017   CL 103 04/07/2017   CREATININE 0.77 04/07/2017   BUN 8 04/07/2017   CO2 27 04/07/2017   TSH 0.75 09/29/2016   PSA 3.31 09/29/2016   INR 0.97 09/08/2014   HGBA1C 5.2 10/13/2014    No results found.  Assessment & Plan:   Jonathan [name] was seen today for back pain, osteoarthritis and hyperlipidemia.  Diagnoses and all orders for this visit:  Need for Tdap vaccination -     Tdap vaccine greater than or equal to 7yo IM  Thrombocytosis ([place])- His platelet count is normal.  His vitamin b12 level is in the no low normal range.  Will continue to follow this in the future. -     CBC with Differential/Platelet; Future -     Vitamin B12; Future -     Folate; Future  Dyslipidemia, goal LDL below 100- He has an elevated ASCVD risk score and did not tolerate atorvastatin.  Will try pitavastatin this time. -     Lipid panel; Future -     Pitavastatin Calcium (LIVALO) 2 MG TABS; Take 1 tablet (2 mg total) by  mouth daily.  Spinal stenosis of lumbar region without neurogenic claudication -     HYDROcodone-acetaminophen (NORCO) 10-325 MG tablet; Take 1 tablet by mouth every 6 (six) hours as needed.   I have discontinued Jonathan [name]'s Azelastine-Fluticasone, levocetirizine, atorvastatin, and aspirin. I am also having him start on Pitavastatin Calcium. Additionally, I am having him maintain his tamsulosin, ibuprofen, Betamethasone Valerate, dexlansoprazole, dutasteride, and HYDROcodone-acetaminophen.  Meds ordered this encounter  Medications  . HYDROcodone-acetaminophen (NORCO) 10-325 MG tablet    Sig: Take 1 tablet by mouth every 6 (six) hours as needed.    Dispense:  120 tablet    Refill:  0  . Pitavastatin Calcium (LIVALO) 2 MG TABS    Sig: Take 1 tablet (2 mg total) by mouth daily.    Dispense:  90 tablet  Refill:  1     Follow-up: Return in about 4 months (around 01/19/2018).  Scarlette Calico, MD

## 2017-09-19 NOTE — Addendum Note (Signed)
Addended by: Aviva Signs M on: 09/19/2017 12:21 PM   Modules accepted: Orders

## 2017-09-25 DIAGNOSIS — R3912 Poor urinary stream: Secondary | ICD-10-CM | POA: Diagnosis not present

## 2017-09-25 DIAGNOSIS — N401 Enlarged prostate with lower urinary tract symptoms: Secondary | ICD-10-CM | POA: Diagnosis not present

## 2017-10-09 DIAGNOSIS — N401 Enlarged prostate with lower urinary tract symptoms: Secondary | ICD-10-CM | POA: Diagnosis not present

## 2017-10-09 DIAGNOSIS — R3912 Poor urinary stream: Secondary | ICD-10-CM | POA: Diagnosis not present

## 2017-10-14 ENCOUNTER — Other Ambulatory Visit: Payer: Self-pay | Admitting: Internal Medicine

## 2017-10-14 DIAGNOSIS — J301 Allergic rhinitis due to pollen: Secondary | ICD-10-CM

## 2017-10-16 ENCOUNTER — Other Ambulatory Visit: Payer: Self-pay | Admitting: Internal Medicine

## 2017-10-16 DIAGNOSIS — M48061 Spinal stenosis, lumbar region without neurogenic claudication: Secondary | ICD-10-CM

## 2017-10-16 NOTE — Telephone Encounter (Signed)
>>   Oct 16, 2017  4:09 PM Oneta Rack wrote: Relation to pt: self Call back [place]: [id number] [place], [address]. [phone] (Phone) [phone] (Fax)   Reason for call:  Patient requesting HYDROcodone-acetaminophen ([place]) 10-325 MG tablet refill, patient declined contacting pharmacy, patient informed please allow 72 hour turn around time,please advise

## 2017-10-17 NOTE — Telephone Encounter (Signed)
Hydrocodone/acetaminophen refill Last OV: 09/19/17 Last Refill:09/19/17 #120 tab no RF [facility] [address]  PCP: Scarlette Calico MD

## 2017-10-19 MED ORDER — HYDROCODONE-ACETAMINOPHEN 10-325 MG PO TABS: 1.0000 | ORAL_TABLET | Freq: Four times a day (QID) | ORAL | 0 refills | Status: DC | PRN

## 2017-10-27 ENCOUNTER — Other Ambulatory Visit: Payer: Self-pay | Admitting: Internal Medicine

## 2017-11-01 ENCOUNTER — Other Ambulatory Visit: Payer: Self-pay | Admitting: Internal Medicine

## 2017-11-01 DIAGNOSIS — E785 Hyperlipidemia, unspecified: Secondary | ICD-10-CM

## 2017-11-15 ENCOUNTER — Telehealth: Payer: Self-pay | Admitting: Internal Medicine

## 2017-11-15 NOTE — Telephone Encounter (Signed)
Hydrocodone refill Last Refill: 5/23/189 #120 Last OV: 09/19/17 PCP: Dr. [name] [facility]  [place]

## 2017-11-15 NOTE — Telephone Encounter (Unsigned)
Copied from [place] [phone]. Topic: Quick Communication - Rx Refill/Question >> Nov 15, 2017  [place] AM Judyann Munson wrote: Medication: HYDROcodone-acetaminophen (NORCO) 10-325 MG tablet     Has the patient contacted their pharmacy? no   Preferred Pharmacy (with phone number or street name): [id number] [place], [address] [place] [address]    Agent: Please be advised that RX refills may take up to 3 business days. We ask that you follow-up with your pharmacy.

## 2017-11-18 ENCOUNTER — Other Ambulatory Visit: Payer: Self-pay | Admitting: Internal Medicine

## 2017-11-18 DIAGNOSIS — M48061 Spinal stenosis, lumbar region without neurogenic claudication: Secondary | ICD-10-CM

## 2017-11-18 DIAGNOSIS — Z79891 Long term (current) use of opiate analgesic: Secondary | ICD-10-CM

## 2017-11-18 MED ORDER — NALOXONE HCL 4 MG/0.1ML NA LIQD: 1.0000 | Freq: Once | NASAL | 2 refills | Status: AC

## 2017-11-18 MED ORDER — HYDROCODONE-ACETAMINOPHEN 10-325 MG PO TABS: 1.0000 | ORAL_TABLET | Freq: Four times a day (QID) | ORAL | 0 refills | Status: DC | PRN

## 2017-11-27 DIAGNOSIS — R3912 Poor urinary stream: Secondary | ICD-10-CM | POA: Diagnosis not present

## 2017-11-27 DIAGNOSIS — N401 Enlarged prostate with lower urinary tract symptoms: Secondary | ICD-10-CM | POA: Diagnosis not present

## 2017-12-13 ENCOUNTER — Other Ambulatory Visit: Payer: Self-pay | Admitting: Internal Medicine

## 2017-12-13 NOTE — Telephone Encounter (Signed)
Copied from [place] [phone]. Topic: Quick Communication - Rx Refill/Question >> Dec 13, 2017  4:06 PM Margot Ables wrote: Medication: HYDROcodone-acetaminophen [facility]) 10-325 MG tablet  - pt said he is due 12/17/17 Has the patient contacted their pharmacy? No - controlled Preferred Pharmacy (with phone number or street name): [id number] [place] [address], [place]. [phone] (Phone) [phone] (Fax)

## 2017-12-14 ENCOUNTER — Other Ambulatory Visit: Payer: Self-pay | Admitting: Internal Medicine

## 2017-12-14 DIAGNOSIS — N401 Enlarged prostate with lower urinary tract symptoms: Principal | ICD-10-CM

## 2017-12-14 DIAGNOSIS — N138 Other obstructive and reflux uropathy: Secondary | ICD-10-CM

## 2017-12-14 NOTE — Telephone Encounter (Signed)
Check [name] registry last filled 11/18/2017.Jonathan KitchenJohny Chess

## 2017-12-16 ENCOUNTER — Other Ambulatory Visit: Payer: Self-pay | Admitting: Internal Medicine

## 2017-12-16 DIAGNOSIS — M48061 Spinal stenosis, lumbar region without neurogenic claudication: Secondary | ICD-10-CM

## 2017-12-16 MED ORDER — HYDROCODONE-ACETAMINOPHEN 10-325 MG PO TABS: 1.0000 | ORAL_TABLET | Freq: Four times a day (QID) | ORAL | 0 refills | Status: DC | PRN

## 2017-12-18 NOTE — Telephone Encounter (Signed)
MD approved and sent rx electronically on Sat 12/16/17.Marland KitchenJohny [name]

## 2018-01-22 ENCOUNTER — Ambulatory Visit (INDEPENDENT_AMBULATORY_CARE_PROVIDER_SITE_OTHER): Payer: Medicare Other | Admitting: Internal Medicine

## 2018-01-22 ENCOUNTER — Encounter: Payer: Self-pay | Admitting: Internal Medicine

## 2018-01-22 VITALS — BP 142/90 | HR 67 | Temp 98.0°F | Resp 16 | Ht 73.0 in | Wt 184.0 lb

## 2018-01-22 DIAGNOSIS — E785 Hyperlipidemia, unspecified: Secondary | ICD-10-CM | POA: Diagnosis not present

## 2018-01-22 DIAGNOSIS — M87052 Idiopathic aseptic necrosis of left femur: Secondary | ICD-10-CM | POA: Diagnosis not present

## 2018-01-22 DIAGNOSIS — I10 Essential (primary) hypertension: Secondary | ICD-10-CM

## 2018-01-22 DIAGNOSIS — M48061 Spinal stenosis, lumbar region without neurogenic claudication: Secondary | ICD-10-CM | POA: Diagnosis not present

## 2018-01-22 DIAGNOSIS — M87051 Idiopathic aseptic necrosis of right femur: Secondary | ICD-10-CM | POA: Diagnosis not present

## 2018-01-22 MED ORDER — HYDROCODONE-ACETAMINOPHEN 10-325 MG PO TABS: 1.0000 | ORAL_TABLET | Freq: Four times a day (QID) | ORAL | 0 refills | Status: DC | PRN

## 2018-01-22 MED ORDER — NEBIVOLOL HCL 5 MG PO TABS: 5.0000 mg | ORAL_TABLET | Freq: Every day | ORAL | 1 refills | Status: DC

## 2018-01-22 NOTE — Progress Notes (Signed)
Subjective:  Patient ID: Jonathan [name], male    DOB: 05-22-1956  Age: 62 y.o. MRN: [id number]  CC: Hypertension and Hyperlipidemia   HPI Jonathan [name] presents for f/up - He has decided not to take a statin because they cause muscle aches.  He continues to complain of pain in his low back and hips but is getting symptom relief with motrin, tylenol and hydrocodone.  He is very active.  He walks 1/2 mile each day and denies any episodes of DOE, CP, palpitations, edema, or fatigue.  Outpatient Medications Prior to Visit  Medication Sig Dispense Refill  . Betamethasone Valerate 0.12 % foam APPLY TOPICALLY TO AFFECTED AREA(S) TWICE DAILY (Patient taking differently: APPLY TOPICALLY TO AFFECTED AREA(S) TWICE DAILY AS NEEDED ITCHING) 100 g 2  . dexlansoprazole (DEXILANT) 60 MG capsule Take 1 capsule (60 mg total) by mouth daily. 30 capsule 11  . dutasteride (AVODART) 0.5 MG capsule TAKE 1 CAPSULE (0.5 MG TOTAL) BY MOUTH DAILY. 90 capsule 1  . ibuprofen (ADVIL,MOTRIN) 800 MG tablet Take 1 tablet (800 mg total) by mouth every 8 (eight) hours as needed. for pain 90 tablet 3  . levocetirizine (XYZAL) 5 MG tablet TAKE 1 TABLET (5 MG TOTAL) BY MOUTH EVERY EVENING. 90 tablet 1  . NARCAN 4 MG/0.1ML LIQD nasal spray kit PLACE 1 SPRAY INTO THE NOSE ONCE FOR 1 DOSE.  2  . tamsulosin (FLOMAX) 0.4 MG CAPS capsule TAKE 1 CAPSULE BY MOUTH EVERY DAY 90 capsule 2  . atorvastatin (LIPITOR) 20 MG tablet TAKE 1 TABLET BY MOUTH EVERY DAY 90 tablet 1  . HYDROcodone-acetaminophen (NORCO) 10-325 MG tablet Take 1 tablet by mouth every 6 (six) hours as needed. 120 tablet 0  . Pitavastatin Calcium (LIVALO) 2 MG TABS Take 1 tablet (2 mg total) by mouth daily. 90 tablet 1   No facility-administered medications prior to visit.     ROS Review of Systems  Constitutional: Negative for diaphoresis and fatigue.  HENT: Negative.   Eyes: Negative for visual disturbance.  Respiratory: Negative.  Negative for  cough, chest tightness, shortness of breath and wheezing.   Cardiovascular: Negative for chest pain, palpitations and leg swelling.  Gastrointestinal: Negative for abdominal pain, constipation, diarrhea, nausea and vomiting.  Genitourinary: Negative.  Negative for difficulty urinating.  Musculoskeletal: Positive for arthralgias and back pain. Negative for myalgias.  Neurological: Negative.  Negative for dizziness, weakness and light-headedness.  Hematological: Negative for adenopathy. Does not bruise/bleed easily.  Psychiatric/Behavioral: Negative.     Objective:  BP (!) 142/90 (BP Location: Left Arm, Patient Position: Sitting, Cuff Size: Normal)   Pulse 67   Temp 98 F (36.7 C) (Oral)   Resp 16   Ht _0  (1.854 m)   Wt 184 lb (83.5 kg)   SpO2 98%   BMI 24.28 kg/m   BP Readings from Last 3 Encounters:  01/22/18 (!) 142/90  09/19/17 138/86  05/24/17 120/80    Wt Readings from Last 3 Encounters:  01/22/18 184 lb (83.5 kg)  09/19/17 192 lb 12 oz (87.4 kg)  05/24/17 196 lb 4 oz (89 kg)    Physical Exam  Constitutional: He is oriented to person, place, and time. No distress.  HENT:  Mouth/Throat: Oropharynx is clear and moist. No oropharyngeal exudate.  Eyes: Conjunctivae are normal. No scleral icterus.  Neck: Normal range of motion. Neck supple. No JVD present. No thyromegaly present.  Cardiovascular: Normal rate, regular rhythm and normal heart sounds. Exam reveals no  gallop and no friction rub.  No murmur heard. Pulmonary/Chest: Effort normal and breath sounds normal. He has no wheezes. He has no rales.  Abdominal: Soft. Normal appearance and bowel sounds are normal. He exhibits no mass. There is no hepatosplenomegaly. There is no tenderness.  Musculoskeletal: Normal range of motion. He exhibits no edema, tenderness or deformity.  Lymphadenopathy:    He has no cervical adenopathy.  Neurological: He is alert and oriented to person, place, and time.  Skin: Skin is warm  and dry. No rash noted. He is not diaphoretic.  Vitals reviewed.   Lab Results  Component Value Date   WBC 2.9 Repeated and verified X2. (L) 09/19/2017   HGB 15.3 09/19/2017   HCT 44.9 09/19/2017   PLT 275.0 09/19/2017   GLUCOSE 102 (H) 04/07/2017   CHOL 205 (H) 09/19/2017   TRIG 79.0 09/19/2017   HDL 52.20 09/19/2017   LDLDIRECT 131.6 07/14/2011   LDLCALC 137 (H) 09/19/2017   ALT 14 09/29/2016   AST 14 09/29/2016   NA 136 04/07/2017   K 4.0 04/07/2017   CL 103 04/07/2017   CREATININE 0.77 04/07/2017   BUN 8 04/07/2017   CO2 27 04/07/2017   TSH 0.75 09/29/2016   PSA 1.53 07/05/2017   INR 0.97 09/08/2014   HGBA1C 5.2 10/13/2014    No results found.  Assessment & Plan:   Jonathan [name] was seen today for hypertension and hyperlipidemia.  Diagnoses and all orders for this visit:  Essential hypertension- His blood pressure is not adequately well controlled.  Will treat with nebivolol. -     nebivolol (BYSTOLIC) 5 MG tablet; Take 1 tablet (5 mg total) by mouth daily.  Dyslipidemia, goal LDL below 100- He is not willing to take a statin.  Spinal stenosis of lumbar region without neurogenic claudication -     HYDROcodone-acetaminophen (NORCO) 10-325 MG tablet; Take 1 tablet by mouth every 6 (six) hours as needed.  Avascular necrosis of bones of both hips (HCC) -     HYDROcodone-acetaminophen (NORCO) 10-325 MG tablet; Take 1 tablet by mouth every 6 (six) hours as needed.   I have discontinued Jonathan [name]'s Pitavastatin Calcium and atorvastatin. I am also having him start on nebivolol. Additionally, I am having him maintain his Betamethasone Valerate, dexlansoprazole, dutasteride, levocetirizine, ibuprofen, tamsulosin, NARCAN, and HYDROcodone-acetaminophen.  Meds ordered this encounter  Medications  . nebivolol (BYSTOLIC) 5 MG tablet    Sig: Take 1 tablet (5 mg total) by mouth daily.    Dispense:  90 tablet    Refill:  1  . HYDROcodone-acetaminophen (NORCO) 10-325  MG tablet    Sig: Take 1 tablet by mouth every 6 (six) hours as needed.    Dispense:  120 tablet    Refill:  0     Follow-up: Return in about 4 months (around 05/24/2018).  Scarlette Calico, MD

## 2018-01-22 NOTE — Patient Instructions (Signed)

## 2018-02-05 ENCOUNTER — Encounter (HOSPITAL_COMMUNITY): Payer: Self-pay | Admitting: Emergency Medicine

## 2018-02-05 ENCOUNTER — Ambulatory Visit (HOSPITAL_COMMUNITY)
Admission: EM | Admit: 2018-02-05 | Discharge: 2018-02-05 | Disposition: A | Discharge status: Home / Self Care | Payer: Medicare Other | Attending: Urgent Care | Admitting: Urgent Care

## 2018-02-05 DIAGNOSIS — S61412A Laceration without foreign body of left hand, initial encounter: Secondary | ICD-10-CM | POA: Diagnosis not present

## 2018-02-05 DIAGNOSIS — M79642 Pain in left hand: Secondary | ICD-10-CM

## 2018-02-05 DIAGNOSIS — Z23 Encounter for immunization: Secondary | ICD-10-CM

## 2018-02-05 MED ORDER — LIDOCAINE-EPINEPHRINE (PF) 2 %-1:200000 IJ SOLN
INTRAMUSCULAR | Status: AC
  Filled 2018-02-05: qty 20

## 2018-02-05 MED ORDER — LIDOCAINE HCL 2 % IJ SOLN
INTRAMUSCULAR | Status: AC
  Filled 2018-02-05: qty 20

## 2018-02-05 MED ORDER — TETANUS-DIPHTH-ACELL PERTUSSIS 5-2.5-18.5 LF-MCG/0.5 IM SUSP
INTRAMUSCULAR | Status: AC
  Filled 2018-02-05: qty 0.5

## 2018-02-05 MED ORDER — TETANUS-DIPHTH-ACELL PERTUSSIS 5-2.5-18.5 LF-MCG/0.5 IM SUSP
0.5000 mL | Freq: Once | INTRAMUSCULAR | Status: AC
  Administered 2018-02-05: 0.5 mL via INTRAMUSCULAR

## 2018-02-05 NOTE — Discharge Instructions (Signed)

## 2018-02-05 NOTE — ED Triage Notes (Signed)
Pt states he was fixing a lawnmower blade and the blade slipped and sliced open the palm of his L hand. No bleeding at this time.

## 2018-02-05 NOTE — ED Provider Notes (Signed)
  MRN: [id number] DOB: [birth year]  Subjective:   Jonathan [name] is a 62 y.o. male presenting for suffering a laceration to his left hand today while trying to fix a lawnmower.  Patient reports that the blade made contact with the palm of his hand, try to clean it and then came to our clinic.  Denies loss of range of motion, needs to have his Tdap updated.  Patient is not currently taking any medications.   Allergies  Allergen Reactions  . Cortisone     REACTION: rash, "act different"  . Lipitor [Atorvastatin Calcium] Other (See Comments)    "side pain"    Past Medical History:  Diagnosis Date  . Anxiety   . Arthritis   . Avascular necrosis of bone (HCC)    left hip   . Back pain, lumbosacral 2000  . Blind right eye   . Colon polyps 08/29/2011   Hyperplastic colon polyps  . Mitral valve prolapse      Past Surgical History:  Procedure Laterality Date  . EYE SURGERY  2007   Fractured bones repaired/blind in right eye  . HERNIA REPAIR     Right inguinal  . SPINE SURGERY    . TOTAL HIP ARTHROPLASTY Right 09/18/2014   Procedure: RIGHT TOTAL HIP ARTHROPLASTY ANTERIOR APPROACH;  Surgeon: Rod Can, MD;  Location: [facility];  Service: Orthopedics;  Laterality: Right;  . TOTAL HIP ARTHROPLASTY Left 04/06/2017   Procedure: LEFT TOTAL HIP ARTHROPLASTY ANTERIOR APPROACH;  Surgeon: Rod Can, MD;  Location: [facility];  Service: Orthopedics;  Laterality: Left;  Needs RNFA    Objective:   Vitals: BP (!) 146/90   Pulse (!) 45   Temp 97.7 F (36.5 C)   Resp 18   SpO2 100%   Physical Exam  Constitutional: He is oriented to person, place, and time. He appears well-developed and well-nourished.  Cardiovascular: Normal rate.  Pulmonary/Chest: Effort normal.  Musculoskeletal:       Left hand: He exhibits tenderness (over wound) and laceration. He exhibits normal range of motion, no bony tenderness, normal capillary refill, no deformity and no swelling. Normal sensation noted.  Normal strength noted.       Hands: Neurological: He is alert and oriented to person, place, and time.    PROCEDURE NOTE: laceration repair Verbal consent obtained from patient.  Local anesthesia with 10cc Lidocaine 2% with epinephrine.  Wound explored for tendon, ligament damage. Wound scrubbed with soap and water and rinsed. Wound closed with 4-0 Prolene (horizontal mattress with 1 simple interrupted) sutures.  Wound cleansed and dressed.   Assessment and Plan :   Laceration of left hand without foreign body, initial encounter  Left hand pain  Need for Tdap vaccination  Laceration repaired successfully.  Patient maintained range of motion status post laceration repair.  Wound care reviewed. Return-to-clinic precautions discussed, patient verbalized understanding. Otherwise, follow up 10 days for suture removal.       Jaynee Eagles, PA-C 02/05/18 1234

## 2018-02-15 ENCOUNTER — Other Ambulatory Visit: Payer: Self-pay

## 2018-02-15 ENCOUNTER — Ambulatory Visit (HOSPITAL_COMMUNITY)
Admission: EM | Admit: 2018-02-15 | Discharge: 2018-02-15 | Disposition: A | Discharge status: Home / Self Care | Payer: Medicare Other

## 2018-02-15 ENCOUNTER — Encounter (HOSPITAL_COMMUNITY): Payer: Self-pay | Admitting: *Deleted

## 2018-02-15 NOTE — ED Triage Notes (Signed)
Suture removal , palm of left hand. No draining wound healed.

## 2018-02-15 NOTE — ED Notes (Signed)
Bed: UC01 Expected date:  Expected time:  Means of arrival:  Comments: Appointments 

## 2018-02-19 ENCOUNTER — Telehealth: Payer: Self-pay | Admitting: Internal Medicine

## 2018-02-19 NOTE — Telephone Encounter (Signed)
Copied from [place] [phone]. Topic: Quick Communication - Rx Refill/Question >> Feb 19, 2018  7:36 AM Scherrie Gerlach wrote: Medication: HYDROcodone-acetaminophen [facility]) 10-325 MG tablet [id number] [address], [place]. [phone] (Phone) [phone] (Fax) Last OV 09/19/17

## 2018-02-19 NOTE — Telephone Encounter (Signed)
LOV 01/22/2018. Pt to follow up in 4 months.  Rx for Hydrocodone last filled on 01/22/2018.  Rx due on Wednesday. Please advise on refill.

## 2018-02-20 ENCOUNTER — Other Ambulatory Visit: Payer: Self-pay | Admitting: Internal Medicine

## 2018-02-20 DIAGNOSIS — M87051 Idiopathic aseptic necrosis of right femur: Secondary | ICD-10-CM

## 2018-02-20 DIAGNOSIS — M87052 Idiopathic aseptic necrosis of left femur: Secondary | ICD-10-CM

## 2018-02-20 DIAGNOSIS — M48061 Spinal stenosis, lumbar region without neurogenic claudication: Secondary | ICD-10-CM

## 2018-02-20 MED ORDER — HYDROCODONE-ACETAMINOPHEN 10-325 MG PO TABS: 1.0000 | ORAL_TABLET | Freq: Four times a day (QID) | ORAL | 0 refills | Status: DC | PRN

## 2018-03-19 ENCOUNTER — Telehealth: Payer: Self-pay | Admitting: Internal Medicine

## 2018-03-19 DIAGNOSIS — J322 Chronic ethmoidal sinusitis: Secondary | ICD-10-CM | POA: Diagnosis not present

## 2018-03-19 DIAGNOSIS — J301 Allergic rhinitis due to pollen: Secondary | ICD-10-CM | POA: Diagnosis not present

## 2018-03-19 DIAGNOSIS — J370 Chronic laryngitis: Secondary | ICD-10-CM | POA: Diagnosis not present

## 2018-03-19 DIAGNOSIS — J342 Deviated nasal septum: Secondary | ICD-10-CM | POA: Diagnosis not present

## 2018-03-19 DIAGNOSIS — J320 Chronic maxillary sinusitis: Secondary | ICD-10-CM | POA: Diagnosis not present

## 2018-03-19 NOTE — Telephone Encounter (Signed)
Copied from [place] [phone]. Topic: Quick Communication - Rx Refill/Question >> Mar 19, 2018  8:39 AM Gardiner Ramus wrote: Medication:HYDROcodone-acetaminophen [facility]) 10-325 MG tablet [[id number]  Has the patient contacted their pharmacy? no Preferred Pharmacy (with phone number or street name): [id number] [address], [place]. [phone] (Phone) [phone] (Fax)  Agent: Please be advised that RX refills may take up to 3 business days. We ask that you follow-up with your pharmacy.

## 2018-03-19 NOTE — Telephone Encounter (Signed)
Routing to dr jones, please advise, thanks 

## 2018-03-22 ENCOUNTER — Other Ambulatory Visit: Payer: Self-pay | Admitting: Internal Medicine

## 2018-03-22 ENCOUNTER — Other Ambulatory Visit: Payer: Self-pay

## 2018-03-22 DIAGNOSIS — M87051 Idiopathic aseptic necrosis of right femur: Secondary | ICD-10-CM

## 2018-03-22 DIAGNOSIS — M87052 Idiopathic aseptic necrosis of left femur: Secondary | ICD-10-CM

## 2018-03-22 DIAGNOSIS — M48061 Spinal stenosis, lumbar region without neurogenic claudication: Secondary | ICD-10-CM

## 2018-03-22 MED ORDER — HYDROCODONE-ACETAMINOPHEN 10-325 MG PO TABS: 1.0000 | ORAL_TABLET | Freq: Four times a day (QID) | ORAL | 0 refills | Status: DC | PRN

## 2018-03-22 NOTE — Telephone Encounter (Signed)
Pt is requesting refill of hydrocodone. Last refilled on 02/20/2018. Please advise.

## 2018-03-22 NOTE — Patient Outreach (Signed)
[facility]) Care Management  03/22/2018  NOTNAMED SCHOLZ III 04-03-1956 [id number]   Medication Adherence call to Mrs. Amil Amen spoke with patient he is no longer taking Atorvastatin 40 mg because of side effects patient said doctor is aware of it.Mr. Korff is showing past due under [place].   [place] Management Direct Dial [phone]  Fax [phone] [email or web address]

## 2018-04-02 DIAGNOSIS — J322 Chronic ethmoidal sinusitis: Secondary | ICD-10-CM | POA: Diagnosis not present

## 2018-04-02 DIAGNOSIS — J320 Chronic maxillary sinusitis: Secondary | ICD-10-CM | POA: Diagnosis not present

## 2018-04-02 DIAGNOSIS — K210 Gastro-esophageal reflux disease with esophagitis: Secondary | ICD-10-CM | POA: Diagnosis not present

## 2018-04-02 DIAGNOSIS — J370 Chronic laryngitis: Secondary | ICD-10-CM | POA: Diagnosis not present

## 2018-04-16 ENCOUNTER — Other Ambulatory Visit: Payer: Self-pay | Admitting: Internal Medicine

## 2018-04-16 DIAGNOSIS — M87051 Idiopathic aseptic necrosis of right femur: Secondary | ICD-10-CM

## 2018-04-16 DIAGNOSIS — M87052 Idiopathic aseptic necrosis of left femur: Secondary | ICD-10-CM

## 2018-04-16 DIAGNOSIS — M48061 Spinal stenosis, lumbar region without neurogenic claudication: Secondary | ICD-10-CM

## 2018-04-16 NOTE — Telephone Encounter (Signed)
Copied from [place] [phone]. Topic: Quick Communication - Rx Refill/Question >> Apr 16, 2018 12:22 PM Margot Ables wrote: Medication: HYDROcodone-acetaminophen ([place]) 10-325 MG tablet - pt says he has 2-3 days left  Has the patient contacted their pharmacy? No - controlled Preferred Pharmacy (with phone number or street name): [id number] [address], [place]. [phone] (Phone) [phone] (Fax)

## 2018-04-17 NOTE — Telephone Encounter (Signed)
Requested medication (s) are due for refill today: yes  Requested medication (s) are on the active medication list: yes  Last refill:  03/22/18  Future visit scheduled: no  Notes to clinic: not delegated    Requested Prescriptions  Pending Prescriptions Disp Refills   HYDROcodone-acetaminophen ([place]CO) 10-325 MG tablet 120 tablet 0    Sig: Take 1 tablet by mouth every 6 (six) hours as needed.     Not Delegated - Analgesics:  Opioid Agonist Combinations Failed - 04/17/2018 11:47 AM      Failed - This refill cannot be delegated      Failed - Urine Drug Screen completed in last 360 days.      Passed - Valid encounter within last 6 months    Recent Outpatient Visits          2 months ago Essential hypertension   [place], Thomas L, MD   7 months ago Need for Tdap vaccination   [place], Thomas L, MD   10 months ago Spinal stenosis of lumbar region without neurogenic claudication   [place], MD   11 months ago Anemia, unspecified type   [facility] -Mayer Camel, MD   1 year ago Encounter for long-term opiate analgesic use   [facility] -Mayer Camel, MD

## 2018-04-18 ENCOUNTER — Other Ambulatory Visit: Payer: Self-pay | Admitting: Internal Medicine

## 2018-04-18 DIAGNOSIS — J301 Allergic rhinitis due to pollen: Secondary | ICD-10-CM

## 2018-04-19 ENCOUNTER — Other Ambulatory Visit: Payer: Self-pay | Admitting: Internal Medicine

## 2018-04-19 DIAGNOSIS — M48061 Spinal stenosis, lumbar region without neurogenic claudication: Secondary | ICD-10-CM

## 2018-04-19 DIAGNOSIS — M87052 Idiopathic aseptic necrosis of left femur: Secondary | ICD-10-CM

## 2018-04-19 DIAGNOSIS — M87051 Idiopathic aseptic necrosis of right femur: Secondary | ICD-10-CM

## 2018-04-19 NOTE — Telephone Encounter (Signed)
Requested medication (s) are due for refill today: yes  Requested medication (s) are on the active medication list: yes    Last refill: 03/22/18 #120 0 refills  Future visit scheduled no  Notes to clinic:not delegated  Requested Prescriptions  Pending Prescriptions Disp Refills   HYDROcodone-acetaminophen ([place]) 10-325 MG tablet 120 tablet 0    Sig: Take 1 tablet by mouth every 6 (six) hours as needed.     Not Delegated - Analgesics:  Opioid Agonist Combinations Failed - 04/19/2018 10:25 AM      Failed - This refill cannot be delegated      Failed - Urine Drug Screen completed in last 360 days.      Passed - Valid encounter within last 6 months    Recent Outpatient Visits          2 months ago Essential hypertension   [place], Thomas L, MD   7 months ago Need for Tdap vaccination   [facility]el, MD   11 months ago Spinal stenosis of lumbar region without neurogenic claudication   [place], Thomas L, MD   12 months ago Anemia, unspecified type   [facility] -[facility]ayer Camel, MD   1 year ago Encounter for long-term opiate analgesic use   [facility] -Mayer Camel, MD

## 2018-04-19 NOTE — Telephone Encounter (Signed)
Copied from [place]. Topic: Quick Communication - Rx Refill/Question >> Apr 19, 2018  8:48 AM Carolyn Stare wrote: Medication    HYDROcodone-acetaminophen ([place]CO) 10-325 MG tablet  [facility]    [facility] [address]   Agent: Please be advised that RX refills may take up to 3 business days. We ask that you follow-up with your pharmacy.

## 2018-04-19 NOTE — Telephone Encounter (Signed)
Pt due for refill on 04/22/2018 (Sunday).

## 2018-04-20 MED ORDER — HYDROCODONE-ACETAMINOPHEN 10-325 MG PO TABS: 1.0000 | ORAL_TABLET | Freq: Four times a day (QID) | ORAL | 0 refills | Status: DC | PRN

## 2018-05-14 DIAGNOSIS — J370 Chronic laryngitis: Secondary | ICD-10-CM | POA: Diagnosis not present

## 2018-05-15 ENCOUNTER — Encounter (HOSPITAL_COMMUNITY): Payer: Self-pay

## 2018-05-15 ENCOUNTER — Telehealth: Payer: Self-pay | Admitting: Internal Medicine

## 2018-05-15 ENCOUNTER — Other Ambulatory Visit: Payer: Self-pay

## 2018-05-15 ENCOUNTER — Emergency Department (HOSPITAL_COMMUNITY): Payer: Medicare Other

## 2018-05-15 ENCOUNTER — Emergency Department (HOSPITAL_COMMUNITY)
Admission: EM | Admit: 2018-05-15 | Discharge: 2018-05-15 | Disposition: A | Discharge status: Home / Self Care | Payer: Medicare Other | Attending: Emergency Medicine | Admitting: Emergency Medicine

## 2018-05-15 DIAGNOSIS — M87051 Idiopathic aseptic necrosis of right femur: Secondary | ICD-10-CM

## 2018-05-15 DIAGNOSIS — R531 Weakness: Secondary | ICD-10-CM | POA: Diagnosis not present

## 2018-05-15 DIAGNOSIS — F17210 Nicotine dependence, cigarettes, uncomplicated: Secondary | ICD-10-CM | POA: Diagnosis not present

## 2018-05-15 DIAGNOSIS — I10 Essential (primary) hypertension: Secondary | ICD-10-CM

## 2018-05-15 DIAGNOSIS — Z96643 Presence of artificial hip joint, bilateral: Secondary | ICD-10-CM | POA: Insufficient documentation

## 2018-05-15 DIAGNOSIS — M87052 Idiopathic aseptic necrosis of left femur: Secondary | ICD-10-CM

## 2018-05-15 DIAGNOSIS — Z79899 Other long term (current) drug therapy: Secondary | ICD-10-CM | POA: Diagnosis not present

## 2018-05-15 DIAGNOSIS — I6782 Cerebral ischemia: Secondary | ICD-10-CM | POA: Diagnosis not present

## 2018-05-15 DIAGNOSIS — R001 Bradycardia, unspecified: Secondary | ICD-10-CM | POA: Diagnosis not present

## 2018-05-15 DIAGNOSIS — M48061 Spinal stenosis, lumbar region without neurogenic claudication: Secondary | ICD-10-CM

## 2018-05-15 DIAGNOSIS — F419 Anxiety disorder, unspecified: Secondary | ICD-10-CM | POA: Insufficient documentation

## 2018-05-15 LAB — COMPREHENSIVE METABOLIC PANEL
ALT: 21 U/L (ref 0–44)
AST: 18 U/L (ref 15–41)
Albumin: 4.4 g/dL (ref 3.5–5.0)
Alkaline Phosphatase: 46 U/L (ref 38–126)
Anion gap: 5 (ref 5–15)
BUN: 16 mg/dL (ref 8–23)
CO2: 27 mmol/L (ref 22–32)
Calcium: 9 mg/dL (ref 8.9–10.3)
Chloride: 105 mmol/L (ref 98–111)
Creatinine, Ser: 0.87 mg/dL (ref 0.61–1.24)
GFR calc Af Amer: >60 mL/min (ref >60)
GFR calc non Af Amer: >60 mL/min (ref >60)
Glucose, Bld: 90 mg/dL (ref 70–99)
Potassium: 4.4 mmol/L (ref 3.5–5.1)
Sodium: 137 mmol/L (ref 135–145)
Total Bilirubin: 0.9 mg/dL (ref 0.3–1.2)
Total Protein: 7 g/dL (ref 6.5–8.1)

## 2018-05-15 LAB — CBC
HCT: 47 % (ref 39.0–52.0)
Hemoglobin: 15.6 g/dL (ref 13.0–17.0)
MCH: 28.5 pg (ref 26.0–34.0)
MCHC: 33.2 g/dL (ref 30.0–36.0)
MCV: 85.9 fL (ref 80.0–100.0)
Platelets: 221 10*3/uL (ref 150–400)
RBC: 5.47 MIL/uL (ref 4.22–5.81)
RDW: 13.5 % (ref 11.5–15.5)
WBC: 3.4 10*3/uL — ABNORMAL LOW (ref 4.0–10.5)
nRBC: 0 % (ref 0.0–0.2)

## 2018-05-15 LAB — TROPONIN I: Troponin I: <0.03 ng/mL (ref <0.03)

## 2018-05-15 MED ORDER — MORPHINE SULFATE (PF) 4 MG/ML IV SOLN
4.0000 mg | Freq: Once | INTRAVENOUS | Status: AC
  Administered 2018-05-15: 4 mg via INTRAVENOUS
  Filled 2018-05-15: qty 1

## 2018-05-15 MED ORDER — ONDANSETRON HCL 4 MG/2ML IJ SOLN
4.0000 mg | Freq: Once | INTRAMUSCULAR | Status: AC
  Administered 2018-05-15: 4 mg via INTRAVENOUS
  Filled 2018-05-15: qty 2

## 2018-05-15 NOTE — ED Triage Notes (Signed)
Pt complains of feeling weak and tired for two days, he states his heart rate is in the 40's and his blood pressure is high

## 2018-05-15 NOTE — ED Provider Notes (Signed)
[facility] DEPT Provider Note   CSN: [id number] Arrival date & time: 05/15/18  9417     History   Chief Complaint Chief Complaint  Patient presents with  . low heart rate    HPI Jonathan [name] is a 62 y.o. male.  Pt presents to the ED today with high blood pressure and low HR.  He also has a severe ha.  The pt said he has a hx of HTN and was put on bystolic which he did not take because it lowers his HR too much.  Pt was unable to sleep due to headache.  He denies cp or sob.     Past Medical History:  Diagnosis Date  . Anxiety   . Arthritis   . Avascular necrosis of bone (HCC)    left hip   . Back pain, lumbosacral 2000  . Blind right eye   . Colon polyps 08/29/2011   Hyperplastic colon polyps  . Mitral valve prolapse     Patient Active Problem List   Diagnosis Date Noted  . Encounter for long-term opiate analgesic use 02/21/2017  . Dyslipidemia, goal LDL below 100 10/03/2016  . Gastroesophageal reflux disease with esophagitis 07/23/2015  . Positive test for herpes simplex virus (HSV) antibody 04/22/2015  . Hyperglycemia 10/13/2014  . History of colonic polyps 10/13/2014  . Allergic rhinitis 06/14/2013  . Essential hypertension 07/29/2011  . Routine general medical examination at a health care facility 07/14/2011  . Nonspecific abnormal electrocardiogram (ECG) (EKG) 07/14/2011  . BPH with obstruction/lower urinary tract symptoms 05/05/2010  . TOBACCO ABUSE 02/14/2008  . Spinal stenosis of lumbar region 02/14/2008    Past Surgical History:  Procedure Laterality Date  . EYE SURGERY  2007   Fractured bones repaired/blind in right eye  . HERNIA REPAIR     Right inguinal  . SPINE SURGERY    . TOTAL HIP ARTHROPLASTY Right 09/18/2014   Procedure: RIGHT TOTAL HIP ARTHROPLASTY ANTERIOR APPROACH;  Surgeon: Rod Can, MD;  Location: [facility];  Service: Orthopedics;  Laterality: Right;  . TOTAL HIP ARTHROPLASTY Left 04/06/2017    Procedure: LEFT TOTAL HIP ARTHROPLASTY ANTERIOR APPROACH;  Surgeon: Rod Can, MD;  Location: [facility];  Service: Orthopedics;  Laterality: Left;  Needs RNFA        Home Medications    Prior to Admission medications   Medication Sig Start Date End Date Taking? Authorizing Provider  esomeprazole (NEXIUM) 40 MG capsule Take 40 mg by mouth daily at 12 noon.   Yes [provider]  fexofenadine (ALLEGRA) 180 MG tablet Take 60 mg by mouth daily.   Yes [provider]  HYDROcodone-acetaminophen (NORCO) 10-325 MG tablet Take 1 tablet by mouth every 6 (six) hours as needed. Patient taking differently: Take 1 tablet by mouth every 6 (six) hours as needed for moderate pain.  04/20/18  Yes Janith Lima, MD  ibuprofen (ADVIL,MOTRIN) 200 MG tablet Take 800 mg by mouth every 6 (six) hours as needed for mild pain.   Yes [provider]  Betamethasone Valerate 0.12 % foam APPLY TOPICALLY TO AFFECTED AREA(S) TWICE DAILY Patient not taking: Reported on 02/05/2018 02/22/17   Janith Lima, MD  dexlansoprazole (DEXILANT) 60 MG capsule Take 1 capsule (60 mg total) by mouth daily. Patient not taking: Reported on 02/05/2018 08/07/17   Janith Lima, MD  dutasteride (AVODART) 0.5 MG capsule TAKE 1 CAPSULE (0.5 MG TOTAL) BY MOUTH DAILY. Patient not taking: Reported on 02/05/2018 09/13/17  Janith Lima, MD  ibuprofen (ADVIL,MOTRIN) 800 MG tablet Take 1 tablet (800 mg total) by mouth every 8 (eight) hours as needed. for pain Patient not taking: Reported on 02/05/2018 10/28/17   Janith Lima, MD  levocetirizine (XYZAL) 5 MG tablet TAKE 1 TABLET (5 MG TOTAL) BY MOUTH EVERY EVENING. Patient not taking: Reported on 05/15/2018 04/18/18   Janith Lima, MD  [facility] 4 MG/0.1ML LIQD nasal spray kit Place 1 spray into the nose once.  11/18/17   [provider]  nebivolol (BYSTOLIC) 5 MG tablet Take 1 tablet (5 mg total) by mouth daily. Patient not taking: Reported on 02/05/2018  01/22/18   Janith Lima, MD  tamsulosin [facility]) 0.4 MG CAPS capsule TAKE 1 CAPSULE BY MOUTH EVERY DAY Patient not taking: Reported on 02/05/2018 12/14/17   Janith Lima, MD    Family History Family History  Problem Relation Age of Onset  . Stroke Mother   . Arthritis Mother   . Stroke Father   . Hypertension Father   . Cancer Neg Hx   . COPD Neg Hx   . Heart disease Neg Hx   . Kidney disease Neg Hx     Social History Social History   Tobacco Use  . Smoking status: Current Some Day Smoker    Packs/day: 0.10    Years: 30.00    Pack years: 3.00    Types: Cigarettes, Cigars  . Smokeless tobacco: Never Used  . Tobacco comment: hsa been quit sincie 08/29/2014   Substance Use Topics  . Alcohol use: No  . Drug use: Yes    Types: Marijuana    Comment:  2-3 days ago      Allergies   Cortisone and Lipitor [atorvastatin calcium]   Review of Systems Review of Systems  Neurological: Positive for headaches.  All other systems reviewed and are negative.    Physical Exam Updated Vital Signs BP (!) 136/95 (BP Location: Left Arm)   Pulse (!) 51   Temp 98.1 F (36.7 C) (Oral)   Resp 14   Ht 6' 1.5" (1.867 m)   Wt 83.9 kg   SpO2 100%   BMI 24.08 kg/m   Physical Exam Vitals signs and nursing note reviewed.  Constitutional:      Appearance: Normal appearance.  HENT:     Head: Normocephalic and atraumatic.     Right Ear: External ear normal.     Left Ear: External ear normal.     Nose: Nose normal.     Mouth/Throat:     Mouth: Mucous membranes are moist.     Pharynx: Oropharynx is clear.  Eyes:     Comments: Blind right eye  Neck:     Musculoskeletal: Normal range of motion and neck supple.  Cardiovascular:     Rate and Rhythm: Normal rate and regular rhythm.     Pulses: Normal pulses.     Heart sounds: Normal heart sounds.  Pulmonary:     Effort: Pulmonary effort is normal.     Breath sounds: Normal breath sounds.  Abdominal:     General: Abdomen is  flat. Bowel sounds are normal.     Palpations: Abdomen is soft.  Musculoskeletal: Normal range of motion.  Skin:    General: Skin is warm and dry.     Capillary Refill: Capillary refill takes less than 2 seconds.  Neurological:     General: No focal deficit present.     Mental Status: He is alert  and oriented to person, place, and time.  Psychiatric:        Mood and Affect: Mood normal.        Behavior: Behavior normal.        Thought Content: Thought content normal.        Judgment: Judgment normal.      ED Treatments / Results  Labs (all labs ordered are listed, but only abnormal results are displayed) Labs Reviewed  CBC - Abnormal; Notable for the following components:      Result Value   WBC 3.4 (*)    All other components within normal limits  TROPONIN I  COMPREHENSIVE METABOLIC PANEL    EKG EKG Interpretation  Date/Time:  Tuesday May 15 2018 07:23:36 EST Ventricular Rate:  53 PR Interval:    QRS Duration: 86 QT Interval:  438 QTC Calculation: 412 R Axis:   86 Text Interpretation:  Sinus rhythm Borderline right axis deviation LVH by voltage Anterior Q waves, possibly due to LVH Minimal ST elevation, lateral leads No significant change since last tracing Confirmed by Isla Pence [phone]) on 05/15/2018 7:58:19 AM   Radiology Ct Head Wo Contrast  Result Date: 05/15/2018 CLINICAL DATA:  62 year old male weak and tired for 2 days. Initial encounter. EXAM: CT HEAD WITHOUT CONTRAST TECHNIQUE: Contiguous axial images were obtained from the base of the skull through the vertex without intravenous contrast. COMPARISON:  12/28/2006 head CT. FINDINGS: Brain: No intracranial hemorrhage or CT evidence of large acute infarct. Chronic microvascular changes. Anterior right frontal calvarial 1.2 cm area of bony overgrowth has changed minimally from the prior examination when this measured up to 1 cm. Minimal impression upon anteromedial right frontal lobe without surrounding  vasogenic edema. Question exostosis versus en plaque meningioma. Vascular: No acute hyperdense vessel. Skull: No acute abnormality. Sinuses/Orbits: Remote right zygomatic arch, right orbital floor, right lateral orbital wall and right maxillary fracture with plate and screws right lateral supraorbital region. Visualized paranasal sinuses are clear. Other: Mastoid air cells and middle ear cavities are clear. IMPRESSION: 1. No intracranial hemorrhage or CT evidence of large acute infarct. 2. Chronic microvascular changes. 3. Anterior right frontal calvarial 1.2 cm area of bony overgrowth has changed minimally from the prior examination when this measured up to 1 cm. Minimal impression upon anteromedial right frontal lobe without surrounding vasogenic edema. Question exostosis versus en plaque meningioma. 4. Remote right zygomatic arch, right orbital floor, right lateral orbital wall and right maxillary fractures. Plate and screws right lateral supraorbital region. Electronically Signed   By: Genia Del M.D.   On: 05/15/2018 09:03    Procedures Procedures (including critical care time)  Medications Ordered in ED Medications  morphine 4 MG/ML injection 4 mg (has no administration in time range)  ondansetron (ZOFRAN) injection 4 mg (has no administration in time range)     Initial Impression / Assessment and Plan / ED Course  I have reviewed the triage vital signs and the nursing notes.  Pertinent labs & imaging results that were available during my care of the patient were reviewed by me and considered in my medical decision making (see chart for details).     BP here has been good.  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  He said he's had that all his life.  He did mention that he's been eating a lot of "country ham."  He salts everything.  Pt is feeling better.  Pt is given a DASH diet to help him with his high salt intake.  He is  instructed to f/u with his pcp.  Return if worse.  Final Clinical Impressions(s) / ED Diagnoses   Final diagnoses:  Bradycardia  Essential hypertension    ED Discharge Orders    None       Isla Pence, MD 05/15/18 [phone]

## 2018-05-15 NOTE — Telephone Encounter (Signed)
Copied from [place] [id number]. Topic: Quick Communication - Rx Refill/Question >> May 15, 2018 11:17 AM Selinda Flavin B, NT wrote: Medication: HYDROcodone-acetaminophen (NORCO) 10-325 MG tablet  Has the patient contacted their pharmacy? Yes.   (Agent: If no, request that the patient contact the pharmacy for the refill.) (Agent: If yes, when and what did the pharmacy advise?)  Preferred Pharmacy (with phone number or street name): [id number] [place], [place].  Agent: Please be advised that RX refills may take up to 3 business days. We ask that you follow-up with your pharmacy.

## 2018-05-15 NOTE — Telephone Encounter (Signed)
[place] Controlled Substance Database checked. Last filled on 04/20/18  Last OV 01/22/18

## 2018-05-24 NOTE — Telephone Encounter (Signed)
Patient called to check status of Rx for HYDROcodone-acetaminophen [name]) 10-325 MG tablet saw [name] on 01/22/18 has an appointment scheduled for 06/06/18. Requesting enough medication to get him to the appointment. Please advise

## 2018-05-24 NOTE — Telephone Encounter (Signed)
Patient called back checking on this refill. Appears medication was denied due to patient needing an appointment. He scheduled next available with [name] in January (back on December 12th).  Patient would like to know if this could be sent in to get him through until the visit with [name]. I informed the [facility] agent that [name] is out of the office until Monday.  Please advise.

## 2018-05-28 ENCOUNTER — Other Ambulatory Visit: Payer: Self-pay | Admitting: Internal Medicine

## 2018-05-28 DIAGNOSIS — M87051 Idiopathic aseptic necrosis of right femur: Secondary | ICD-10-CM

## 2018-05-28 DIAGNOSIS — M87052 Idiopathic aseptic necrosis of left femur: Secondary | ICD-10-CM

## 2018-05-28 DIAGNOSIS — M48061 Spinal stenosis, lumbar region without neurogenic claudication: Secondary | ICD-10-CM

## 2018-05-28 MED ORDER — HYDROCODONE-ACETAMINOPHEN 10-325 MG PO TABS: 1.0000 | ORAL_TABLET | Freq: Four times a day (QID) | ORAL | 0 refills | Status: DC | PRN

## 2018-05-28 NOTE — Telephone Encounter (Signed)
Routing to dr jones, please advise, thanks 

## 2018-05-28 NOTE — Telephone Encounter (Signed)
Pt calling to f/up on this request.  Please advise.

## 2018-06-06 ENCOUNTER — Ambulatory Visit (INDEPENDENT_AMBULATORY_CARE_PROVIDER_SITE_OTHER): Payer: Medicare Other | Admitting: Internal Medicine

## 2018-06-06 ENCOUNTER — Encounter: Payer: Self-pay | Admitting: Internal Medicine

## 2018-06-06 VITALS — BP 152/94 | HR 56 | Temp 98.1°F | Ht 73.5 in | Wt 201.0 lb

## 2018-06-06 DIAGNOSIS — I10 Essential (primary) hypertension: Secondary | ICD-10-CM

## 2018-06-06 MED ORDER — AZILSARTAN-CHLORTHALIDONE 40-12.5 MG PO TABS: 1.0000 | ORAL_TABLET | Freq: Every day | ORAL | 1 refills | Status: DC

## 2018-06-06 NOTE — Progress Notes (Signed)
Subjective:  Patient ID: Jonathan [name], male    DOB: 08-Dec-1955  Age: 63 y.o. MRN: [id number]  CC: Hypertension   HPI Jashad L Pillard [name] presents for a BP check - He was seen in the ED about 2 months ago and was concerned about a low heart rate.  He was evaluated and treated and was told to stop taking Bystolic.  He is not currently taking anything for hypertension.  He denies dizziness, lightheadedness, palpitations, near-syncope, or syncope.  Outpatient Medications Prior to Visit  Medication Sig Dispense Refill  . Betamethasone Valerate 0.12 % foam APPLY TOPICALLY TO AFFECTED AREA(S) TWICE DAILY 100 g 2  . dutasteride (AVODART) 0.5 MG capsule TAKE 1 CAPSULE (0.5 MG TOTAL) BY MOUTH DAILY. 90 capsule 1  . esomeprazole (NEXIUM) 40 MG capsule Take 40 mg by mouth daily at 12 noon.    . fexofenadine (ALLEGRA) 180 MG tablet Take 60 mg by mouth daily.    Marland Kitchen HYDROcodone-acetaminophen (NORCO) 10-325 MG tablet Take 1 tablet by mouth every 6 (six) hours as needed for moderate pain. 90 tablet 0  . ibuprofen (ADVIL,MOTRIN) 800 MG tablet Take 1 tablet (800 mg total) by mouth every 8 (eight) hours as needed. for pain 90 tablet 3  . NARCAN 4 MG/0.1ML LIQD nasal spray kit Place 1 spray into the nose once.   2  . tamsulosin (FLOMAX) 0.4 MG CAPS capsule TAKE 1 CAPSULE BY MOUTH EVERY DAY 90 capsule 2  . dexlansoprazole (DEXILANT) 60 MG capsule Take 1 capsule (60 mg total) by mouth daily. 30 capsule 11  . ibuprofen (ADVIL,MOTRIN) 200 MG tablet Take 800 mg by mouth every 6 (six) hours as needed for mild pain.    Marland Kitchen levocetirizine (XYZAL) 5 MG tablet TAKE 1 TABLET (5 MG TOTAL) BY MOUTH EVERY EVENING. 90 tablet 1  . nebivolol (BYSTOLIC) 5 MG tablet Take 1 tablet (5 mg total) by mouth daily. 90 tablet 1   No facility-administered medications prior to visit.     ROS Review of Systems  Constitutional: Negative for diaphoresis, fatigue and unexpected weight change.  HENT: Negative.  Negative for  trouble swallowing.   Eyes: Negative for visual disturbance.  Respiratory: Negative for cough, chest tightness, shortness of breath and wheezing.   Gastrointestinal: Negative for abdominal pain, constipation, diarrhea, nausea and vomiting.  Genitourinary: Negative.  Negative for difficulty urinating.  Musculoskeletal: Positive for arthralgias and back pain. Negative for neck pain.  Skin: Negative for color change and pallor.  Neurological: Negative for dizziness, weakness, light-headedness and headaches.  Hematological: Negative for adenopathy. Does not bruise/bleed easily.  Psychiatric/Behavioral: Negative.  Negative for dysphoric mood and sleep disturbance. The patient is not nervous/anxious.     Objective:  BP (!) 152/94 (BP Location: Left Arm, Patient Position: Sitting, Cuff Size: Normal)   Pulse (!) 56   Temp 98.1 F (36.7 C) (Oral)   Ht 6' 1.5" (1.867 m)   Wt 201 lb (91.2 kg)   SpO2 97%   BMI 26.16 kg/m   BP Readings from Last 3 Encounters:  06/06/18 (!) 152/94  05/15/18 (!) 132/95  02/05/18 (!) 146/90    Wt Readings from Last 3 Encounters:  06/06/18 201 lb (91.2 kg)  05/15/18 185 lb (83.9 kg)  01/22/18 184 lb (83.5 kg)    Physical Exam Constitutional:      Appearance: He is ill-appearing (intermittently winces in pain).  HENT:     Nose: Nose normal. No congestion.     Mouth/Throat:  Pharynx: Oropharynx is clear. No oropharyngeal exudate or posterior oropharyngeal erythema.  Eyes:     General: No scleral icterus.    Conjunctiva/sclera: Conjunctivae normal.  Cardiovascular:     Rate and Rhythm: Regular rhythm. Bradycardia present.     Chest Wall: PMI is not displaced.     Heart sounds: S1 normal and S2 normal. No murmur. No gallop. No S3 or S4 sounds.   Pulmonary:     Effort: Pulmonary effort is normal.     Breath sounds: No stridor. No wheezing or rhonchi.  Abdominal:     General: Abdomen is flat.     Palpations: There is no mass.     Tenderness: There  is no abdominal tenderness. There is no guarding.     Hernia: No hernia is present.  Musculoskeletal: Normal range of motion.        General: No tenderness or deformity.     Right lower leg: No edema.     Left lower leg: No edema.  Skin:    General: Skin is warm.     Coloration: Skin is not pale.     Findings: No rash.  Neurological:     General: No focal deficit present.     Mental Status: He is alert and oriented to person, place, and time. Mental status is at baseline.  Psychiatric:        Mood and Affect: Mood normal.        Behavior: Behavior normal.        Thought Content: Thought content normal.        Judgment: Judgment normal.     Lab Results  Component Value Date   WBC 3.4 (L) 05/15/2018   HGB 15.6 05/15/2018   HCT 47.0 05/15/2018   PLT 221 05/15/2018   GLUCOSE 90 05/15/2018   CHOL 205 (H) 09/19/2017   TRIG 79.0 09/19/2017   HDL 52.20 09/19/2017   LDLDIRECT 131.6 07/14/2011   LDLCALC 137 (H) 09/19/2017   ALT 21 05/15/2018   AST 18 05/15/2018   NA 137 05/15/2018   K 4.4 05/15/2018   CL 105 05/15/2018   CREATININE 0.87 05/15/2018   BUN 16 05/15/2018   CO2 27 05/15/2018   TSH 0.75 09/29/2016   PSA 1.53 07/05/2017   INR 0.97 09/08/2014   HGBA1C 5.2 10/13/2014    Ct Head Wo Contrast  Result Date: 05/15/2018 CLINICAL DATA:  64 year old male weak and tired for 2 days. Initial encounter. EXAM: CT HEAD WITHOUT CONTRAST TECHNIQUE: Contiguous axial images were obtained from the base of the skull through the vertex without intravenous contrast. COMPARISON:  12/28/2006 head CT. FINDINGS: Brain: No intracranial hemorrhage or CT evidence of large acute infarct. Chronic microvascular changes. Anterior right frontal calvarial 1.2 cm area of bony overgrowth has changed minimally from the prior examination when this measured up to 1 cm. Minimal impression upon anteromedial right frontal lobe without surrounding vasogenic edema. Question exostosis versus en plaque meningioma.  Vascular: No acute hyperdense vessel. Skull: No acute abnormality. Sinuses/Orbits: Remote right zygomatic arch, right orbital floor, right lateral orbital wall and right maxillary fracture with plate and screws right lateral supraorbital region. Visualized paranasal sinuses are clear. Other: Mastoid air cells and middle ear cavities are clear. IMPRESSION: 1. No intracranial hemorrhage or CT evidence of large acute infarct. 2. Chronic microvascular changes. 3. Anterior right frontal calvarial 1.2 cm area of bony overgrowth has changed minimally from the prior examination when this measured up to 1 cm. Minimal impression upon  anteromedial right frontal lobe without surrounding vasogenic edema. Question exostosis versus en plaque meningioma. 4. Remote right zygomatic arch, right orbital floor, right lateral orbital wall and right maxillary fractures. Plate and screws right lateral supraorbital region. Electronically Signed   By: Genia Del M.D.   On: 05/15/2018 09:03    Assessment & Plan:   Amr was seen today for hypertension.  Diagnoses and all orders for this visit:  Essential hypertension- His blood pressure is not adequately well controlled.  I have asked him to start taking the combination of a thiazide diuretic and ARB. -     Azilsartan-Chlorthalidone (EDARBYCLOR) 40-12.5 MG TABS; Take 1 tablet by mouth daily.   I have discontinued Dainel L. Penniman [name]'s dexlansoprazole, nebivolol, and levocetirizine. I am also having him start on Azilsartan-Chlorthalidone. Additionally, I am having him maintain his Betamethasone Valerate, dutasteride, ibuprofen, tamsulosin, NARCAN, fexofenadine, esomeprazole, and HYDROcodone-acetaminophen.  Meds ordered this encounter  Medications  . Azilsartan-Chlorthalidone (EDARBYCLOR) 40-12.5 MG TABS    Sig: Take 1 tablet by mouth daily.    Dispense:  90 tablet    Refill:  1     Follow-up: Return in about 3 months (around 09/05/2018).  Scarlette Calico, MD

## 2018-06-06 NOTE — Patient Instructions (Signed)

## 2018-06-18 ENCOUNTER — Other Ambulatory Visit: Payer: Self-pay | Admitting: Internal Medicine

## 2018-06-18 DIAGNOSIS — M87051 Idiopathic aseptic necrosis of right femur: Secondary | ICD-10-CM

## 2018-06-18 DIAGNOSIS — M87052 Idiopathic aseptic necrosis of left femur: Secondary | ICD-10-CM

## 2018-06-18 DIAGNOSIS — M48061 Spinal stenosis, lumbar region without neurogenic claudication: Secondary | ICD-10-CM

## 2018-06-18 NOTE — Telephone Encounter (Signed)
Copied from [place] [id number]. Topic: Quick Communication - Rx Refill/Question >> Jun 18, 2018  8:45 AM Carolyn Stare wrote: Medication   HYDROcodone-acetaminophen (NORCO) 10-325 MG tablet    pt is aware RX not due till next week but said he does not want to  run out   Preferred Pharmacy   [facility]  Agent: Please be advised that RX refills may take up to 3 business days. We ask that you follow-up with your pharmacy.

## 2018-06-25 ENCOUNTER — Telehealth: Payer: Self-pay | Admitting: Internal Medicine

## 2018-06-25 NOTE — Telephone Encounter (Signed)
Copied from [place] [id number]. Topic: Quick Communication - See Telephone Encounter >> Jun 25, 2018 12:24 PM Ivar Drape wrote: CRM for notification. See Telephone encounter for: 06/25/18. Patient would like his HYDROcodone-acetaminophen (NORCO) 10-325 MG tablet medication refilled and sent to [facility] [address].

## 2018-06-26 DIAGNOSIS — J322 Chronic ethmoidal sinusitis: Secondary | ICD-10-CM | POA: Diagnosis not present

## 2018-06-26 DIAGNOSIS — J320 Chronic maxillary sinusitis: Secondary | ICD-10-CM | POA: Diagnosis not present

## 2018-06-26 DIAGNOSIS — J040 Acute laryngitis: Secondary | ICD-10-CM | POA: Diagnosis not present

## 2018-06-28 NOTE — Telephone Encounter (Signed)
Rf rq for hydrocodone. Please advise

## 2018-06-28 NOTE — Telephone Encounter (Signed)
Patient called to check on the status of the Rx for HYDROcodone-acetaminophen (NORCO) 10-325 MG tablet. He has asked for a call when Rx is either ready or when its been sent to the pharmacy.

## 2018-06-29 ENCOUNTER — Other Ambulatory Visit: Payer: Self-pay | Admitting: Internal Medicine

## 2018-06-29 DIAGNOSIS — M48061 Spinal stenosis, lumbar region without neurogenic claudication: Secondary | ICD-10-CM

## 2018-06-29 DIAGNOSIS — M87052 Idiopathic aseptic necrosis of left femur: Secondary | ICD-10-CM

## 2018-06-29 DIAGNOSIS — M87051 Idiopathic aseptic necrosis of right femur: Secondary | ICD-10-CM

## 2018-06-29 MED ORDER — HYDROCODONE-ACETAMINOPHEN 10-325 MG PO TABS: 1.0000 | ORAL_TABLET | Freq: Four times a day (QID) | ORAL | 0 refills | Status: DC | PRN

## 2018-06-29 NOTE — Telephone Encounter (Signed)
Pt calling again to find out why the refill request was denied for medication.  Pt states that he has been out of medication completely for several days now.

## 2018-06-29 NOTE — Telephone Encounter (Signed)
Pt contacted about his refill.   Pt stated that was getting #120 (Nov 2019). The quantity changed to #90 in December. Pt is requ#90 sent on 06/29/2018

## 2018-07-01 NOTE — Telephone Encounter (Signed)
I want him to try to control his pain with 90 tabs per month

## 2018-07-09 NOTE — Telephone Encounter (Signed)
LVM for pt to call back as soon as possible.   

## 2018-07-11 NOTE — Telephone Encounter (Signed)
Pt informed of PCP recommendation.

## 2018-07-11 NOTE — Telephone Encounter (Signed)
LVM for pt to call back as soon as possible.   

## 2018-07-25 ENCOUNTER — Other Ambulatory Visit: Payer: Self-pay | Admitting: Internal Medicine

## 2018-07-25 DIAGNOSIS — M87052 Idiopathic aseptic necrosis of left femur: Secondary | ICD-10-CM

## 2018-07-25 DIAGNOSIS — M87051 Idiopathic aseptic necrosis of right femur: Secondary | ICD-10-CM

## 2018-07-25 DIAGNOSIS — M48061 Spinal stenosis, lumbar region without neurogenic claudication: Secondary | ICD-10-CM

## 2018-07-25 NOTE — Telephone Encounter (Signed)
Check [place] registry last filled 06/29/2018.Marland Kitchen/LMB

## 2018-07-25 NOTE — Telephone Encounter (Signed)
Copied from [place] [id number]. Topic: Quick Communication - Rx Refill/Question >> Jul 25, 2018  8:20 AM Scherrie Gerlach wrote: Medication: HYDROcodone-acetaminophen [facility]) 10-325 MG tablet  [id number] [address], [place]. [phone] (Phone) [phone] (Fax)

## 2018-07-27 MED ORDER — HYDROCODONE-ACETAMINOPHEN 10-325 MG PO TABS: 1.0000 | ORAL_TABLET | Freq: Four times a day (QID) | ORAL | 0 refills | Status: DC | PRN

## 2018-08-13 ENCOUNTER — Ambulatory Visit (INDEPENDENT_AMBULATORY_CARE_PROVIDER_SITE_OTHER): Payer: Medicare Other | Admitting: Internal Medicine

## 2018-08-13 ENCOUNTER — Encounter: Payer: Self-pay | Admitting: Internal Medicine

## 2018-08-13 ENCOUNTER — Other Ambulatory Visit: Payer: Self-pay

## 2018-08-13 VITALS — BP 150/90 | HR 73 | Temp 98.3°F | Ht 73.5 in | Wt 198.0 lb

## 2018-08-13 DIAGNOSIS — I10 Essential (primary) hypertension: Secondary | ICD-10-CM | POA: Diagnosis not present

## 2018-08-13 DIAGNOSIS — R739 Hyperglycemia, unspecified: Secondary | ICD-10-CM | POA: Diagnosis not present

## 2018-08-13 DIAGNOSIS — J069 Acute upper respiratory infection, unspecified: Secondary | ICD-10-CM | POA: Diagnosis not present

## 2018-08-13 MED ORDER — PROMETHAZINE-CODEINE 6.25-10 MG/5ML PO SYRP: 5.0000 mL | ORAL_SOLUTION | Freq: Four times a day (QID) | ORAL | 0 refills | Status: DC | PRN

## 2018-08-13 MED ORDER — AZITHROMYCIN 250 MG PO TABS: ORAL_TABLET | ORAL | 1 refills | Status: DC

## 2018-08-13 NOTE — Assessment & Plan Note (Signed)
Mild to mod, for antibx course,  to f/u any worsening symptoms or concerns 

## 2018-08-13 NOTE — Patient Instructions (Signed)
Please take all new medication as prescribed - the antibiotic, and cough medicine as needed  Please continue all other medications as before, and refills have been done if requested.  Please have the pharmacy call with any other refills you may need.  Please keep your appointments with your specialists as you may have planned   

## 2018-08-13 NOTE — Progress Notes (Signed)
Subjective:    Patient ID: Jonathan [name], male    DOB: Dec 06, 1955, 63 y.o.   MRN: [id number]  HPI   Here with 2-3 days acute onset fever, facial pain, pressure, headache, general weakness and malaise, and greenish d/c, with mild ST and cough, but pt denies chest pain, wheezing, increased sob or doe, orthopnea, PND, increased LE swelling, palpitations, dizziness or syncope.  Pt denies new neurological symptoms such as new headache, or facial or extremity weakness or numbness   Pt denies polydipsia, polyuria Past Medical History:  Diagnosis Date  . Anxiety   . Arthritis   . Avascular necrosis of bone (HCC)    left hip   . Back pain, lumbosacral 2000  . Blind right eye   . Colon polyps 08/29/2011   Hyperplastic colon polyps  . Mitral valve prolapse    Past Surgical History:  Procedure Laterality Date  . EYE SURGERY  2007   Fractured bones repaired/blind in right eye  . HERNIA REPAIR     Right inguinal  . SPINE SURGERY    . TOTAL HIP ARTHROPLASTY Right 09/18/2014   Procedure: RIGHT TOTAL HIP ARTHROPLASTY ANTERIOR APPROACH;  Surgeon: Rod Can, MD;  Location: [facility];  Service: Orthopedics;  Laterality: Right;  . TOTAL HIP ARTHROPLASTY Left 04/06/2017   Procedure: LEFT TOTAL HIP ARTHROPLASTY ANTERIOR APPROACH;  Surgeon: Rod Can, MD;  Location: [facility];  Service: Orthopedics;  Laterality: Left;  Needs RNFA    reports that he has been smoking cigarettes and cigars. He has a 3.00 pack-year smoking history. He has never used smokeless tobacco. He reports current drug use. Drug: Marijuana. He reports that he does not drink alcohol. family history includes Arthritis in his mother; Hypertension in his father; Stroke in his father and mother. Allergies  Allergen Reactions  . Cortisone     REACTION: rash, "act different"  . Lipitor [Atorvastatin Calcium] Other (See Comments)    "side pain"   Current Outpatient Medications on File Prior to Visit  Medication Sig Dispense  Refill  . Betamethasone Valerate 0.12 % foam APPLY TOPICALLY TO AFFECTED AREA(S) TWICE DAILY 100 g 2  . DEXILANT 60 MG capsule Take 60 mg by mouth daily.    Marland Kitchen dutasteride (AVODART) 0.5 MG capsule TAKE 1 CAPSULE (0.5 MG TOTAL) BY MOUTH DAILY. 90 capsule 1  . esomeprazole (NEXIUM) 40 MG capsule Take 40 mg by mouth daily at 12 noon.    . fexofenadine (ALLEGRA) 180 MG tablet Take 60 mg by mouth daily.    Marland Kitchen HYDROcodone-acetaminophen (NORCO) 10-325 MG tablet Take 1 tablet by mouth every 6 (six) hours as needed for moderate pain. 90 tablet 0  . ibuprofen (ADVIL,MOTRIN) 800 MG tablet Take 1 tablet (800 mg total) by mouth every 8 (eight) hours as needed. for pain 90 tablet 3  . NARCAN 4 MG/0.1ML LIQD nasal spray kit Place 1 spray into the nose once.   2  . tamsulosin (FLOMAX) 0.4 MG CAPS capsule TAKE 1 CAPSULE BY MOUTH EVERY DAY 90 capsule 2  . Azilsartan-Chlorthalidone (EDARBYCLOR) 40-12.5 MG TABS Take 1 tablet by mouth daily. (Patient not taking: Reported on 08/13/2018) 90 tablet 1   No current facility-administered medications on file prior to visit.    Review of Systems  Constitutional: Negative for other unusual diaphoresis or sweats HENT: Negative for ear discharge or swelling Eyes: Negative for other worsening visual disturbances Respiratory: Negative for stridor or other swelling  Gastrointestinal: Negative for worsening distension or other blood  Genitourinary: Negative for retention or other urinary change Musculoskeletal: Negative for other MSK pain or swelling Skin: Negative for color change or other new lesions Neurological: Negative for worsening tremors and other numbness  Psychiatric/Behavioral: Negative for worsening agitation or other fatigue All other system neg per pt    Objective:   Physical Exam BP (!) 150/90 (BP Location: Left Arm, Patient Position: Sitting, Cuff Size: Normal)   Pulse 73   Temp 98.3 F (36.8 C) (Oral)   Ht 6' 1.5" (1.867 m)   Wt 198 lb (89.8 kg)   SpO2  96%   BMI 25.77 kg/m  VS noted, mild ill Constitutional: Pt appears in NAD HENT: Head: NCAT.  Right Ear: External ear normal.  Left Ear: External ear normal.  Bilat tm's with mild erythema.  Max sinus areas mild tender.  Pharynx with mild erythema, no exudate Eyes: . Pupils are equal, round, and reactive to light. Conjunctivae and EOM are normal Nose: without d/c or deformity Neck: Neck supple. Gross normal ROM Cardiovascular: Normal rate and regular rhythm.   Pulmonary/Chest: Effort normal and breath sounds without rales or wheezing.  Neurological: Pt is alert. At baseline orientation, motor grossly intact Skin: Skin is warm. No rashes, other new lesions, no LE edema Psychiatric: Pt behavior is normal without agitation  No other exam findings Lab Results  Component Value Date   WBC 3.4 (L) 05/15/2018   HGB 15.6 05/15/2018   HCT 47.0 05/15/2018   PLT 221 05/15/2018   GLUCOSE 90 05/15/2018   CHOL 205 (H) 09/19/2017   TRIG 79.0 09/19/2017   HDL 52.20 09/19/2017   LDLDIRECT 131.6 07/14/2011   LDLCALC 137 (H) 09/19/2017   ALT 21 05/15/2018   AST 18 05/15/2018   NA 137 05/15/2018   K 4.4 05/15/2018   CL 105 05/15/2018   CREATININE 0.87 05/15/2018   BUN 16 05/15/2018   CO2 27 05/15/2018   TSH 0.75 09/29/2016   PSA 1.53 07/05/2017   INR 0.97 09/08/2014   HGBA1C 5.2 10/13/2014       Assessment & Plan:

## 2018-08-13 NOTE — Assessment & Plan Note (Signed)
stable overall by history and exam, recent data reviewed with pt, and pt to continue medical treatment as before,  to f/u any worsening symptoms or concerns  

## 2018-08-21 ENCOUNTER — Other Ambulatory Visit: Payer: Self-pay | Admitting: Internal Medicine

## 2018-08-21 DIAGNOSIS — M87051 Idiopathic aseptic necrosis of right femur: Secondary | ICD-10-CM

## 2018-08-21 DIAGNOSIS — M48061 Spinal stenosis, lumbar region without neurogenic claudication: Secondary | ICD-10-CM

## 2018-08-21 DIAGNOSIS — M87052 Idiopathic aseptic necrosis of left femur: Secondary | ICD-10-CM

## 2018-08-22 ENCOUNTER — Other Ambulatory Visit: Payer: Self-pay | Admitting: Internal Medicine

## 2018-08-23 ENCOUNTER — Other Ambulatory Visit: Payer: Self-pay | Admitting: Internal Medicine

## 2018-08-23 DIAGNOSIS — M87052 Idiopathic aseptic necrosis of left femur: Secondary | ICD-10-CM

## 2018-08-23 DIAGNOSIS — M87051 Idiopathic aseptic necrosis of right femur: Secondary | ICD-10-CM

## 2018-08-23 DIAGNOSIS — M48061 Spinal stenosis, lumbar region without neurogenic claudication: Secondary | ICD-10-CM

## 2018-08-23 MED ORDER — HYDROCODONE-ACETAMINOPHEN 10-325 MG PO TABS: 1.0000 | ORAL_TABLET | Freq: Four times a day (QID) | ORAL | 0 refills | Status: DC | PRN

## 2018-09-19 ENCOUNTER — Other Ambulatory Visit: Payer: Self-pay | Admitting: Internal Medicine

## 2018-09-19 DIAGNOSIS — M48061 Spinal stenosis, lumbar region without neurogenic claudication: Secondary | ICD-10-CM

## 2018-09-19 DIAGNOSIS — M87052 Idiopathic aseptic necrosis of left femur: Secondary | ICD-10-CM

## 2018-09-19 DIAGNOSIS — M87051 Idiopathic aseptic necrosis of right femur: Secondary | ICD-10-CM

## 2018-09-19 NOTE — Telephone Encounter (Signed)
Requested medication (s) are due for refill today: yes  Requested medication (s) are on the active medication list: yes   Last refill: 08/23/2018 #90  0 refills  Future visit scheduled No  Notes to clinic: not delegated  Requested Prescriptions  Pending Prescriptions Disp Refills   HYDROcodone-acetaminophen (NORCO) 10-325 MG tablet 90 tablet 0    Sig: Take 1 tablet by mouth every 6 (six) hours as needed for moderate pain.     Not Delegated - Analgesics:  Opioid Agonist Combinations Failed - 09/19/2018 10:26 AM      Failed - This refill cannot be delegated      Failed - Urine Drug Screen completed in last 360 days.      Passed - Valid encounter within last 6 months    Recent Outpatient Visits          1 month ago Essential hypertension   [place], James W, MD   3 months ago Essential hypertension   [place], Thomas L, MD   8 months ago Essential hypertension   [place], Thomas L, MD   1 year ago Need for Tdap vaccination   [place], Thomas L, MD   1 year ago Spinal stenosis of lumbar region without neurogenic claudication   [facility] -Mayer Camel, MD

## 2018-09-19 NOTE — Telephone Encounter (Signed)
Check [place] registry last filled 08/23/2018...Jonathan [name]

## 2018-09-21 NOTE — Telephone Encounter (Signed)
Pt called back about this medication. Please advise.

## 2018-09-22 MED ORDER — HYDROCODONE-ACETAMINOPHEN 10-325 MG PO TABS: 1.0000 | ORAL_TABLET | Freq: Four times a day (QID) | ORAL | 0 refills | Status: DC | PRN

## 2018-09-27 ENCOUNTER — Other Ambulatory Visit: Payer: Self-pay | Admitting: Internal Medicine

## 2018-10-23 ENCOUNTER — Telehealth: Payer: Self-pay | Admitting: Internal Medicine

## 2018-10-23 ENCOUNTER — Other Ambulatory Visit: Payer: Self-pay | Admitting: Internal Medicine

## 2018-10-23 DIAGNOSIS — M87051 Idiopathic aseptic necrosis of right femur: Secondary | ICD-10-CM

## 2018-10-23 DIAGNOSIS — M48061 Spinal stenosis, lumbar region without neurogenic claudication: Secondary | ICD-10-CM

## 2018-10-23 DIAGNOSIS — M87052 Idiopathic aseptic necrosis of left femur: Secondary | ICD-10-CM

## 2018-10-23 MED ORDER — HYDROCODONE-ACETAMINOPHEN 10-325 MG PO TABS: 1.0000 | ORAL_TABLET | Freq: Four times a day (QID) | ORAL | 0 refills | Status: DC | PRN

## 2018-10-23 NOTE — Telephone Encounter (Signed)
Copied from [place] [phone]. Topic: General - Other >> Oct 23, 2018  7:20 AM Lennox Solders wrote: Reason for CRM: pt is calling and has contactrd the pharm. Pt is calling and needs a refill on hydrocodone. [facility] [address]

## 2018-10-23 NOTE — Telephone Encounter (Signed)
Per database hydrocodone, last filled on 09/22/2018.

## 2018-11-18 ENCOUNTER — Other Ambulatory Visit: Payer: Self-pay | Admitting: Internal Medicine

## 2018-11-18 DIAGNOSIS — N138 Other obstructive and reflux uropathy: Secondary | ICD-10-CM

## 2018-11-19 ENCOUNTER — Telehealth: Payer: Self-pay | Admitting: Internal Medicine

## 2018-11-19 ENCOUNTER — Other Ambulatory Visit: Payer: Self-pay | Admitting: Internal Medicine

## 2018-11-19 NOTE — Telephone Encounter (Signed)
Relation to pt: self  Call back number: [phone] [facility]acy[id number] [place], [place]. [phone] (Phone) [phone] (Fax)     Reason for call:  Patient requesting HYDROcodone-acetaminophen ([place]) 10-325 MG tablet , patient informed please allow 48 to 72 hour turn around, please advise

## 2018-11-21 ENCOUNTER — Other Ambulatory Visit: Payer: Self-pay | Admitting: Internal Medicine

## 2018-11-21 DIAGNOSIS — M87051 Idiopathic aseptic necrosis of right femur: Secondary | ICD-10-CM

## 2018-11-21 DIAGNOSIS — M48061 Spinal stenosis, lumbar region without neurogenic claudication: Secondary | ICD-10-CM

## 2018-11-21 MED ORDER — HYDROCODONE-ACETAMINOPHEN 10-325 MG PO TABS: 1.0000 | ORAL_TABLET | Freq: Four times a day (QID) | ORAL | 0 refills | Status: DC | PRN

## 2018-12-17 ENCOUNTER — Telehealth: Payer: Self-pay | Admitting: Internal Medicine

## 2018-12-17 NOTE — Telephone Encounter (Signed)
Medication Refill - Medication: HYDROcodone-acetaminophen (NORCO) 10-325 MG tablet   Has the patient contacted their pharmacy? No. (Agent: If no, request that the patient contact the pharmacy for the refill.) (Agent: If yes, when and what did the pharmacy advise?)  Preferred Pharmacy (with phone number or street name):  [id number] [address], [place] [address]  Phone: [phone] Fax: [phone]  Not a 24 hour pharmacy; exact hours not known.    Agent: Please be advised that RX refills may take up to 3 business days. We ask that you follow-up with your pharmacy.

## 2018-12-21 NOTE — Telephone Encounter (Signed)
Patient called to get an update on his medication that he requested a refill on a few days ago. Asking for a call back today when Rx is sent to the pharmacy Ph# [phone]

## 2018-12-24 ENCOUNTER — Other Ambulatory Visit: Payer: Self-pay | Admitting: Internal Medicine

## 2018-12-24 DIAGNOSIS — M87052 Idiopathic aseptic necrosis of left femur: Secondary | ICD-10-CM

## 2018-12-24 DIAGNOSIS — M87051 Idiopathic aseptic necrosis of right femur: Secondary | ICD-10-CM

## 2018-12-24 DIAGNOSIS — M48061 Spinal stenosis, lumbar region without neurogenic claudication: Secondary | ICD-10-CM

## 2018-12-24 MED ORDER — HYDROCODONE-ACETAMINOPHEN 10-325 MG PO TABS: 1.0000 | ORAL_TABLET | Freq: Four times a day (QID) | ORAL | 0 refills | Status: DC | PRN

## 2018-12-24 NOTE — Telephone Encounter (Signed)
Patient called to get information on his pain medication requested last Monday. Please advise

## 2018-12-24 NOTE — Telephone Encounter (Signed)
Pt scheduled 7/28

## 2018-12-24 NOTE — Telephone Encounter (Signed)
Patient has called back in regard. Please give patient a call back.

## 2018-12-24 NOTE — Telephone Encounter (Signed)
Pt has an appt for 12/25/2018.  Okay to send rx for hydrocodone prior to appt?

## 2018-12-24 NOTE — Telephone Encounter (Signed)
Can you call pt and have him schedule an appt? His last visit was in January.

## 2018-12-25 ENCOUNTER — Encounter: Payer: Self-pay | Admitting: Internal Medicine

## 2018-12-25 ENCOUNTER — Other Ambulatory Visit: Payer: Self-pay

## 2018-12-25 ENCOUNTER — Other Ambulatory Visit (INDEPENDENT_AMBULATORY_CARE_PROVIDER_SITE_OTHER): Payer: Medicare Other

## 2018-12-25 ENCOUNTER — Ambulatory Visit (INDEPENDENT_AMBULATORY_CARE_PROVIDER_SITE_OTHER): Payer: Medicare Other | Admitting: Internal Medicine

## 2018-12-25 VITALS — BP 152/98 | HR 55 | Temp 97.9°F | Resp 16 | Ht 73.5 in | Wt 199.8 lb

## 2018-12-25 DIAGNOSIS — N401 Enlarged prostate with lower urinary tract symptoms: Secondary | ICD-10-CM

## 2018-12-25 DIAGNOSIS — I519 Heart disease, unspecified: Secondary | ICD-10-CM

## 2018-12-25 DIAGNOSIS — N138 Other obstructive and reflux uropathy: Secondary | ICD-10-CM | POA: Diagnosis not present

## 2018-12-25 DIAGNOSIS — I10 Essential (primary) hypertension: Secondary | ICD-10-CM | POA: Diagnosis not present

## 2018-12-25 DIAGNOSIS — Z79891 Long term (current) use of opiate analgesic: Secondary | ICD-10-CM

## 2018-12-25 DIAGNOSIS — E785 Hyperlipidemia, unspecified: Secondary | ICD-10-CM

## 2018-12-25 DIAGNOSIS — Z0000 Encounter for general adult medical examination without abnormal findings: Secondary | ICD-10-CM

## 2018-12-25 DIAGNOSIS — Z0001 Encounter for general adult medical examination with abnormal findings: Secondary | ICD-10-CM

## 2018-12-25 DIAGNOSIS — I5189 Other ill-defined heart diseases: Secondary | ICD-10-CM

## 2018-12-25 LAB — BASIC METABOLIC PANEL
BUN: 13 mg/dL (ref 6–23)
CO2: 31 mEq/L (ref 19–32)
Calcium: 9.6 mg/dL (ref 8.4–10.5)
Chloride: 102 mEq/L (ref 96–112)
Creatinine, Ser: 0.93 mg/dL (ref 0.40–1.50)
GFR: 98.56 mL/min (ref >60.00)
Glucose, Bld: 98 mg/dL (ref 70–99)
Potassium: 4.6 mEq/L (ref 3.5–5.1)
Sodium: 138 mEq/L (ref 135–145)

## 2018-12-25 LAB — LIPID PANEL
Cholesterol: 189 mg/dL (ref 0–200)
HDL: 47.5 mg/dL (ref >39.00)
LDL Cholesterol: 106 mg/dL — ABNORMAL HIGH (ref 0–99)
NonHDL: 141.68
Total CHOL/HDL Ratio: 4
Triglycerides: 177 mg/dL — ABNORMAL HIGH (ref 0.0–149.0)
VLDL: 35.4 mg/dL (ref 0.0–40.0)

## 2018-12-25 LAB — URINALYSIS, ROUTINE W REFLEX MICROSCOPIC
Bilirubin Urine: NEGATIVE
Hgb urine dipstick: NEGATIVE
Ketones, ur: NEGATIVE
Leukocytes,Ua: NEGATIVE
Nitrite: NEGATIVE
RBC / HPF: NONE SEEN (ref 0–?)
Specific Gravity, Urine: 1.025 (ref 1.000–1.030)
Total Protein, Urine: NEGATIVE
Urine Glucose: NEGATIVE
Urobilinogen, UA: 0.2 (ref 0.0–1.0)
WBC, UA: NONE SEEN (ref 0–?)
pH: 6.5 (ref 5.0–8.0)

## 2018-12-25 LAB — CBC WITH DIFFERENTIAL/PLATELET
Basophils Absolute: 0 10*3/uL (ref 0.0–0.1)
Basophils Relative: 1.4 % (ref 0.0–3.0)
Eosinophils Absolute: 0.1 10*3/uL (ref 0.0–0.7)
Eosinophils Relative: 1.7 % (ref 0.0–5.0)
HCT: 45.4 % (ref 39.0–52.0)
Hemoglobin: 15.2 g/dL (ref 13.0–17.0)
Lymphocytes Relative: 52.9 % — ABNORMAL HIGH (ref 12.0–46.0)
Lymphs Abs: 1.8 10*3/uL (ref 0.7–4.0)
MCHC: 33.5 g/dL (ref 30.0–36.0)
MCV: 85.4 fl (ref 78.0–100.0)
Monocytes Absolute: 0.4 10*3/uL (ref 0.1–1.0)
Monocytes Relative: 12.1 % — ABNORMAL HIGH (ref 3.0–12.0)
Neutro Abs: 1.1 10*3/uL — ABNORMAL LOW (ref 1.4–7.7)
Neutrophils Relative %: 31.9 % — ABNORMAL LOW (ref 43.0–77.0)
Platelets: 220 10*3/uL (ref 150.0–400.0)
RBC: 5.31 Mil/uL (ref 4.22–5.81)
RDW: 14.4 % (ref 11.5–15.5)
WBC: 3.4 10*3/uL — ABNORMAL LOW (ref 4.0–10.5)

## 2018-12-25 LAB — HEPATIC FUNCTION PANEL
ALT: 15 U/L (ref 0–53)
AST: 15 U/L (ref 0–37)
Albumin: 4.6 g/dL (ref 3.5–5.2)
Alkaline Phosphatase: 61 U/L (ref 39–117)
Bilirubin, Direct: 0.1 mg/dL (ref 0.0–0.3)
Total Bilirubin: 0.5 mg/dL (ref 0.2–1.2)
Total Protein: 6.8 g/dL (ref 6.0–8.3)

## 2018-12-25 LAB — PSA: PSA: 4.42 ng/mL — ABNORMAL HIGH (ref 0.10–4.00)

## 2018-12-25 LAB — TSH: TSH: 1.09 u[IU]/mL (ref 0.35–4.50)

## 2018-12-25 MED ORDER — INDAPAMIDE 1.25 MG PO TABS: 1.2500 mg | ORAL_TABLET | Freq: Every day | ORAL | 0 refills | Status: DC

## 2018-12-25 MED ORDER — AMLODIPINE BESYLATE 5 MG PO TABS: 5.0000 mg | ORAL_TABLET | Freq: Every day | ORAL | 0 refills | Status: DC

## 2018-12-25 MED ORDER — PITAVASTATIN CALCIUM 2 MG PO TABS: 1.0000 | ORAL_TABLET | Freq: Every day | ORAL | 1 refills | Status: DC

## 2018-12-25 NOTE — Progress Notes (Signed)
Subjective:  Patient ID: Jonathan [name], male    DOB: 04-05-1956  Age: 63 y.o. MRN: [id number]  CC: Annual Exam and Hypertension   HPI Jonathan [name] presents for a CPX.  He saw his urologist about 3 weeks ago and says a genitourinary and rectal exam was completed.  He has a mildly elevated PSA which they are going to repeat in 4 months.  When I last saw him 6 months ago I asked him to start taking Edarbyclor for blood pressure control.  He tells me he is not taking it.  He is not sure why.  He walks about 2 miles a day and denies any recent episodes of CP, DOE, palpitations, edema, or fatigue.  He tells me he is not taking any decongestants or anti-inflammatories.  He continues to complain of musculoskeletal pain.  He tells me he cannot have any more surgeries.  He wants to continue taking hydrocodone and acetaminophen.  Outpatient Medications Prior to Visit  Medication Sig Dispense Refill  . Betamethasone Valerate 0.12 % foam APPLY TOPICALLY TO AFFECTED AREA(S) TWICE DAILY 100 g 2  . DEXILANT 60 MG capsule TAKE 1 CAPSULE BY MOUTH EVERY DAY 90 capsule 1  . dutasteride (AVODART) 0.5 MG capsule TAKE 1 CAPSULE (0.5 MG TOTAL) BY MOUTH DAILY. 90 capsule 1  . esomeprazole (NEXIUM) 40 MG capsule Take 40 mg by mouth daily at 12 noon.    . fexofenadine (ALLEGRA) 180 MG tablet Take 60 mg by mouth daily.    Marland Kitchen HYDROcodone-acetaminophen (NORCO) 10-325 MG tablet Take 1 tablet by mouth every 6 (six) hours as needed for moderate pain. 90 tablet 0  . NARCAN 4 MG/0.1ML LIQD nasal spray kit Place 1 spray into the nose once.   2  . tamsulosin (FLOMAX) 0.4 MG CAPS capsule TAKE 1 CAPSULE BY MOUTH EVERY DAY 90 capsule 2  . Azilsartan-Chlorthalidone (EDARBYCLOR) 40-12.5 MG TABS Take 1 tablet by mouth daily. 90 tablet 1  . ibuprofen (ADVIL,MOTRIN) 800 MG tablet Take 1 tablet (800 mg total) by mouth every 8 (eight) hours as needed. for pain 90 tablet 3   No facility-administered medications prior  to visit.     ROS Review of Systems  Constitutional: Negative.  Negative for appetite change, diaphoresis, fatigue and unexpected weight change.  HENT: Negative.  Negative for trouble swallowing.   Eyes: Negative for visual disturbance.  Respiratory: Negative for cough, chest tightness, shortness of breath and wheezing.   Cardiovascular: Negative for chest pain, palpitations and leg swelling.  Gastrointestinal: Negative for abdominal pain, constipation, diarrhea, nausea and vomiting.  Endocrine: Negative.   Genitourinary: Negative.  Negative for difficulty urinating.  Musculoskeletal: Positive for arthralgias and back pain. Negative for myalgias and neck pain.  Skin: Negative.  Negative for color change.  Neurological: Negative for dizziness, weakness, light-headedness and headaches.  Hematological: Negative for adenopathy. Does not bruise/bleed easily.  Psychiatric/Behavioral: Negative.  Negative for dysphoric mood and sleep disturbance. The patient is not nervous/anxious.     Objective:  BP (!) 152/98 (BP Location: Left Arm, Patient Position: Sitting, Cuff Size: Normal) Comment: BP (R) 152/98 (L) 158/102  Pulse (!) 55   Temp 97.9 F (36.6 C) (Oral)   Resp 16   Ht 6' 1.5" (1.867 m)   Wt 199 lb 12 oz (90.6 kg)   SpO2 98%   BMI 26.00 kg/m   BP Readings from Last 3 Encounters:  12/25/18 (!) 152/98  08/13/18 (!) 150/90  06/06/18 (!) 152/94  Wt Readings from Last 3 Encounters:  12/25/18 199 lb 12 oz (90.6 kg)  08/13/18 198 lb (89.8 kg)  06/06/18 201 lb (91.2 kg)    Physical Exam Constitutional:      Appearance: He is not ill-appearing or diaphoretic.  HENT:     Nose: Nose normal.     Mouth/Throat:     Pharynx: Oropharynx is clear.  Eyes:     General: No scleral icterus.    Conjunctiva/sclera: Conjunctivae normal.  Neck:     Musculoskeletal: Normal range of motion. No neck rigidity.  Cardiovascular:     Rate and Rhythm: Regular rhythm. Bradycardia present.      Heart sounds: No murmur. No gallop.      Comments: EKG ----  Marked sinus  Bradycardia  Voltage criteria for LVH  (R(V6) exceeds 2.26 mV)  -Voltage criteria w/o ST/T abnormality may be normal.   -Anteroseptal infarct -age undetermined  -Intraventricular conduction delay -may be secondary to infarct.   ABNORMAL - no significant change compared to the prior EKG Pulmonary:     Effort: Pulmonary effort is normal.     Breath sounds: No stridor. No wheezing, rhonchi or rales.  Abdominal:     General: Abdomen is flat. Bowel sounds are normal. There is no distension.     Palpations: There is no hepatomegaly, splenomegaly or mass.     Tenderness: There is no abdominal tenderness.  Genitourinary:    Comments: GU and rectal exams were deferred at his request since he just had this done 3 weeks ago by his urologist. Musculoskeletal: Normal range of motion.        General: No swelling.     Right lower leg: No edema.     Left lower leg: No edema.  Lymphadenopathy:     Cervical: No cervical adenopathy.  Skin:    General: Skin is warm and dry.     Coloration: Skin is not pale.  Neurological:     General: No focal deficit present.     Mental Status: He is alert and oriented to person, place, and time. Mental status is at baseline.  Psychiatric:        Mood and Affect: Mood normal.        Behavior: Behavior normal.        Thought Content: Thought content normal.        Judgment: Judgment normal.     Lab Results  Component Value Date   WBC 3.4 (L) 12/25/2018   HGB 15.2 12/25/2018   HCT 45.4 12/25/2018   PLT 220.0 12/25/2018   GLUCOSE 98 12/25/2018   CHOL 189 12/25/2018   TRIG 177.0 (H) 12/25/2018   HDL 47.50 12/25/2018   LDLDIRECT 131.6 07/14/2011   LDLCALC 106 (H) 12/25/2018   ALT 15 12/25/2018   AST 15 12/25/2018   NA 138 12/25/2018   K 4.6 12/25/2018   CL 102 12/25/2018   CREATININE 0.93 12/25/2018   BUN 13 12/25/2018   CO2 31 12/25/2018   TSH 1.09 12/25/2018   PSA 4.42 (H)  12/25/2018   INR 0.97 09/08/2014   HGBA1C 5.2 10/13/2014    Ct Head Wo Contrast  Result Date: 05/15/2018 CLINICAL DATA:  63 year old male weak and tired for 2 days. Initial encounter. EXAM: CT HEAD WITHOUT CONTRAST TECHNIQUE: Contiguous axial images were obtained from the base of the skull through the vertex without intravenous contrast. COMPARISON:  12/28/2006 head CT. FINDINGS: Brain: No intracranial hemorrhage or CT evidence of large acute infarct. Chronic  microvascular changes. Anterior right frontal calvarial 1.2 cm area of bony overgrowth has changed minimally from the prior examination when this measured up to 1 cm. Minimal impression upon anteromedial right frontal lobe without surrounding vasogenic edema. Question exostosis versus en plaque meningioma. Vascular: No acute hyperdense vessel. Skull: No acute abnormality. Sinuses/Orbits: Remote right zygomatic arch, right orbital floor, right lateral orbital wall and right maxillary fracture with plate and screws right lateral supraorbital region. Visualized paranasal sinuses are clear. Other: Mastoid air cells and middle ear cavities are clear. IMPRESSION: 1. No intracranial hemorrhage or CT evidence of large acute infarct. 2. Chronic microvascular changes. 3. Anterior right frontal calvarial 1.2 cm area of bony overgrowth has changed minimally from the prior examination when this measured up to 1 cm. Minimal impression upon anteromedial right frontal lobe without surrounding vasogenic edema. Question exostosis versus en plaque meningioma. 4. Remote right zygomatic arch, right orbital floor, right lateral orbital wall and right maxillary fractures. Plate and screws right lateral supraorbital region. Electronically Signed   By: Genia Del M.D.   On: 05/15/2018 09:03    Assessment & Plan:   Kason was seen today for annual exam and hypertension.  Diagnoses and all orders for this visit:  Essential hypertension- His blood pressure is not  adequately well controlled and there is LVH on his EKG.  I have asked him to start taking amlodipine and a thiazide diuretic to treat this. -     CBC with Differential/Platelet; Future -     Basic metabolic panel; Future -     TSH; Future -     Urinalysis, Routine w reflex microscopic; Future -     EKG 12-Lead -     indapamide (LOZOL) 1.25 MG tablet; Take 1 tablet (1.25 mg total) by mouth daily. -     amLODipine (NORVASC) 5 MG tablet; Take 1 tablet (5 mg total) by mouth daily.  BPH with obstruction/lower urinary tract symptoms- He has no symptoms that need to be treated.  This is being followed by urology. -     PSA; Future  Routine general medical examination at a health care facility- Exam completed, labs reviewed, he refused a pneumonia vaccine, colon cancer screening is up-to-date, patient education material was given.  Encounter for long-term opiate analgesic use- I will monitor him for compliance and will screen for substance abuse. -     Cancel: Pain Mgmt, Profile 8 w/Conf, U; Future -     Pain Mgmt, Profile 8 w/Conf, U; Future  Dyslipidemia, goal LDL below 100- He has an elevated ASCVD risk score so I have asked him to start taking a statin for CV risk reduction. -     Lipid panel; Future -     TSH; Future -     Hepatic function panel; Future -     Pitavastatin Calcium 2 MG TABS; Take 1 tablet (2 mg total) by mouth daily.  Grade II diastolic dysfunction- I will work to get better control of his blood pressure.  He had an echo done about 7 years ago that revealed diastolic dysfunction.  I have asked him to have a follow-up with cardiology to see if the echo needs to be repeated. -     Ambulatory referral to Cardiology -     indapamide (LOZOL) 1.25 MG tablet; Take 1 tablet (1.25 mg total) by mouth daily. -     amLODipine (NORVASC) 5 MG tablet; Take 1 tablet (5 mg total) by mouth daily.   I  have discontinued Chanler L. Crigger [name]'s ibuprofen and Azilsartan-Chlorthalidone. I am  also having him start on indapamide, amLODipine, and Pitavastatin Calcium. Additionally, I am having him maintain his Betamethasone Valerate, tamsulosin, Narcan, fexofenadine, esomeprazole, Dexilant, dutasteride, and HYDROcodone-acetaminophen.  Meds ordered this encounter  Medications  . indapamide (LOZOL) 1.25 MG tablet    Sig: Take 1 tablet (1.25 mg total) by mouth daily.    Dispense:  90 tablet    Refill:  0  . amLODipine (NORVASC) 5 MG tablet    Sig: Take 1 tablet (5 mg total) by mouth daily.    Dispense:  90 tablet    Refill:  0  . Pitavastatin Calcium 2 MG TABS    Sig: Take 1 tablet (2 mg total) by mouth daily.    Dispense:  90 tablet    Refill:  1     Follow-up: Return in about 2 months (around 02/25/2019).  Scarlette Calico, MD

## 2018-12-25 NOTE — Patient Instructions (Signed)

## 2018-12-27 ENCOUNTER — Encounter: Payer: Self-pay | Admitting: Internal Medicine

## 2018-12-27 LAB — PAIN MGMT, PROFILE 8 W/CONF, U
6 Acetylmorphine: NEGATIVE ng/mL
Alcohol Metabolites: NEGATIVE ng/mL (ref <500)
Amphetamines: NEGATIVE ng/mL
Benzodiazepines: NEGATIVE ng/mL
Buprenorphine, Urine: NEGATIVE ng/mL
Cocaine Metabolite: NEGATIVE ng/mL
Codeine: NEGATIVE ng/mL
Creatinine: 166.6 mg/dL
Hydrocodone: 1928 ng/mL
Hydromorphone: 604 ng/mL
MDMA: NEGATIVE ng/mL
Marijuana Metabolite: 321 ng/mL
Marijuana Metabolite: POSITIVE ng/mL
Morphine: NEGATIVE ng/mL
Norhydrocodone: 3085 ng/mL
Opiates: POSITIVE ng/mL
Oxidant: NEGATIVE ug/mL
Oxycodone: NEGATIVE ng/mL
pH: 6.3 (ref 4.5–9.0)

## 2019-01-03 DIAGNOSIS — M7751 Other enthesopathy of right foot: Secondary | ICD-10-CM | POA: Diagnosis not present

## 2019-01-03 DIAGNOSIS — M2011 Hallux valgus (acquired), right foot: Secondary | ICD-10-CM | POA: Diagnosis not present

## 2019-01-03 DIAGNOSIS — M722 Plantar fascial fibromatosis: Secondary | ICD-10-CM | POA: Diagnosis not present

## 2019-01-17 DIAGNOSIS — M722 Plantar fascial fibromatosis: Secondary | ICD-10-CM | POA: Diagnosis not present

## 2019-01-17 DIAGNOSIS — M71571 Other bursitis, not elsewhere classified, right ankle and foot: Secondary | ICD-10-CM | POA: Diagnosis not present

## 2019-01-21 ENCOUNTER — Telehealth: Payer: Self-pay | Admitting: Internal Medicine

## 2019-01-21 NOTE — Telephone Encounter (Signed)
Medication Refill - Medication: HYDROcodone-acetaminophen (NORCO) 10-325 MG tablet    Has the patient contacted their pharmacy? No. (Agent: If no, request that the patient contact the pharmacy for the refill.) (Agent: If yes, when and what did the pharmacy advise?)  Preferred Pharmacy (with phone number or street name):  [id number] [address], [place]. [phone] (Phone) [phone] (Fax)     Agent: Please be advised that RX refills may take up to 3 business days. We ask that you follow-up with your pharmacy.

## 2019-01-22 NOTE — Telephone Encounter (Signed)
Last filled on 12/24/2018.   Pt is giving the 24-48 hr notice for refill requests.

## 2019-01-23 NOTE — Telephone Encounter (Signed)
Patient called in checking on status of refill. Please advise.

## 2019-01-24 ENCOUNTER — Other Ambulatory Visit: Payer: Self-pay | Admitting: Internal Medicine

## 2019-01-24 DIAGNOSIS — M87051 Idiopathic aseptic necrosis of right femur: Secondary | ICD-10-CM

## 2019-01-24 DIAGNOSIS — M48061 Spinal stenosis, lumbar region without neurogenic claudication: Secondary | ICD-10-CM

## 2019-01-24 MED ORDER — HYDROCODONE-ACETAMINOPHEN 10-325 MG PO TABS: 1.0000 | ORAL_TABLET | Freq: Four times a day (QID) | ORAL | 0 refills | Status: DC | PRN

## 2019-02-07 DIAGNOSIS — M7989 Other specified soft tissue disorders: Secondary | ICD-10-CM | POA: Diagnosis not present

## 2019-02-07 DIAGNOSIS — M7751 Other enthesopathy of right foot: Secondary | ICD-10-CM | POA: Diagnosis not present

## 2019-02-20 ENCOUNTER — Other Ambulatory Visit: Payer: Self-pay | Admitting: Internal Medicine

## 2019-02-20 ENCOUNTER — Telehealth: Payer: Self-pay | Admitting: Cardiology

## 2019-02-20 DIAGNOSIS — M48061 Spinal stenosis, lumbar region without neurogenic claudication: Secondary | ICD-10-CM

## 2019-02-20 DIAGNOSIS — M87051 Idiopathic aseptic necrosis of right femur: Secondary | ICD-10-CM

## 2019-02-20 NOTE — Telephone Encounter (Signed)
Medication refill: HYDROcodone-acetaminophen (NORCO) 10-325 MG tablet [id number]   [id number] [address], [place]. [phone] (Phone) [phone] (Fax)   Pt aware of turn around time.

## 2019-02-20 NOTE — Telephone Encounter (Signed)
New Message    Patient would like wife to come to appointment with him please call to discuss.

## 2019-02-20 NOTE — Telephone Encounter (Signed)
Requested medication (s) are due for refill today: yes  Requested medication (s) are on the active medication list: yes  Last refill:  01/24/2019  Future visit scheduled: no  Notes to clinic: refill cannot be delegated    Requested Prescriptions  Pending Prescriptions Disp Refills   HYDROcodone-acetaminophen (NORCO) 10-325 MG tablet 90 tablet 0    Sig: Take 1 tablet by mouth every 6 (six) hours as needed for moderate pain.     Not Delegated - Analgesics:  Opioid Agonist Combinations Failed - 02/20/2019  7:36 AM      Failed - This refill cannot be delegated      Passed - Urine Drug Screen completed in last 360 days.      Passed - Valid encounter within last 6 months    Recent Outpatient Visits          1 month ago Essential hypertension   [place], Thomas L, MD   6 months ago Essential hypertension   [place], James W, MD   8 months ago Essential hypertension   [place], Thomas L, MD   1 year ago Essential hypertension   [place], Thomas L, MD   1 year ago Need for Tdap vaccination   [place], MD      Future Appointments            In 5 days [place], Doreene Burke, PA-C [place], [facility]

## 2019-02-21 ENCOUNTER — Ambulatory Visit: Payer: Medicare Other | Admitting: Cardiology

## 2019-02-22 NOTE — Telephone Encounter (Signed)
Ok per [place] and patient made aware.

## 2019-02-23 MED ORDER — HYDROCODONE-ACETAMINOPHEN 10-325 MG PO TABS: 1.0000 | ORAL_TABLET | Freq: Four times a day (QID) | ORAL | 0 refills | Status: DC | PRN

## 2019-02-25 ENCOUNTER — Other Ambulatory Visit: Payer: Self-pay

## 2019-02-25 ENCOUNTER — Encounter: Payer: Self-pay | Admitting: Cardiology

## 2019-02-25 ENCOUNTER — Ambulatory Visit (INDEPENDENT_AMBULATORY_CARE_PROVIDER_SITE_OTHER): Payer: Medicare Other | Admitting: General Practice

## 2019-02-25 VITALS — BP 130/84 | Temp 96.8°F | Ht 73.5 in | Wt 194.0 lb

## 2019-02-25 DIAGNOSIS — I519 Heart disease, unspecified: Secondary | ICD-10-CM

## 2019-02-25 DIAGNOSIS — E785 Hyperlipidemia, unspecified: Secondary | ICD-10-CM

## 2019-02-25 DIAGNOSIS — I10 Essential (primary) hypertension: Secondary | ICD-10-CM

## 2019-02-25 DIAGNOSIS — I5189 Other ill-defined heart diseases: Secondary | ICD-10-CM

## 2019-02-25 MED ORDER — PITAVASTATIN CALCIUM 2 MG PO TABS: 1.0000 | ORAL_TABLET | Freq: Every day | ORAL | 1 refills | Status: DC

## 2019-02-25 NOTE — Progress Notes (Signed)
Cardiology Clinic Note   Patient Name: Jonathan [name] Date of Encounter: 02/25/2019  Primary Care Provider:  Janith Lima, MD Primary Cardiologist:  No primary care provider on file.  Patient Profile    Jonathan [name] 63 year old male presents today for follow-up of his hypertension and diastolic heart failure.  Past Medical History    Past Medical History:  Diagnosis Date  . Anxiety   . Arthritis   . Avascular necrosis of bone (HCC)    left hip   . Back pain, lumbosacral 2000  . Blind right eye   . Colon polyps 08/29/2011   Hyperplastic colon polyps  . Mitral valve prolapse    Past Surgical History:  Procedure Laterality Date  . EYE SURGERY  2007   Fractured bones repaired/blind in right eye  . HERNIA REPAIR     Right inguinal  . SPINE SURGERY    . TOTAL HIP ARTHROPLASTY Right 09/18/2014   Procedure: RIGHT TOTAL HIP ARTHROPLASTY ANTERIOR APPROACH;  Surgeon: Jonathan Can, MD;  Location: [facility];  Service: Orthopedics;  Laterality: Right;  . TOTAL HIP ARTHROPLASTY Left 04/06/2017   Procedure: LEFT TOTAL HIP ARTHROPLASTY ANTERIOR APPROACH;  Surgeon: Jonathan Can, MD;  Location: [facility];  Service: Orthopedics;  Laterality: Left;  Needs RNFA    Allergies  Allergies  Allergen Reactions  . Cortisone     REACTION: rash, "act different"  . Lipitor [Atorvastatin Calcium] Other (See Comments)    "side pain"    History of Present Illness    Jonathan [name] was last seen by Dr. Percival [name] on 07/29/2011.  During that time he was being evaluated due to a abnormal EKG with peaked T wave and poor anterior R wave progression suggestive of an old anterior infarct.  He underwent a treadmill stress test which was abnormal and subsequently underwent a nuclear stress test that showed good exercise capacity, normal blood pressure, no significant ST segment change suggestive of ischemia.  Low risk nuclear study.  His PMH also includes essential hypertension, GERD, tobacco  abuse, hyperglycemia, dyslipidemia and grade 2 diastolic dysfunction.  He presents to the clinic today and states he feels well.  He works daily as an Cabin crew and states every other day he walks 3 miles or more.  He also states he routinely does calisthenic type activities including push-ups sit ups and jumping jacks, several days a week, without difficulty.  He presents today for follow-up evaluation based on recommendation from his primary care provider.  He denies chest pain, shortness of breath, lower extremity edema, fatigue, palpitations, melena, hematuria, hemoptysis, diaphoresis, weakness, presyncope, syncope, orthopnea, and PND.   Home Medications    Prior to Admission medications   Medication Sig Start Date End Date Taking? Authorizing Provider  amLODipine (NORVASC) 5 MG tablet Take 1 tablet (5 mg total) by mouth daily. 12/25/18   Jonathan Lima, MD  Betamethasone Valerate 0.12 % foam APPLY TOPICALLY TO AFFECTED AREA(S) TWICE DAILY 02/22/17   Jonathan Lima, MD  DEXILANT 60 MG capsule TAKE 1 CAPSULE BY MOUTH EVERY DAY 09/27/18   Jonathan Lima, MD  dutasteride (AVODART) 0.5 MG capsule TAKE 1 CAPSULE (0.5 MG TOTAL) BY MOUTH DAILY. 11/19/18   Jonathan Lima, MD  esomeprazole (NEXIUM) 40 MG capsule Take 40 mg by mouth daily at 12 noon.    [provider]  fexofenadine (ALLEGRA) 180 MG tablet Take 60 mg by mouth daily.    [provider]  HYDROcodone-acetaminophen [facility])  10-325 MG tablet Take 1 tablet by mouth every 6 (six) hours as needed for moderate pain. 02/23/19   Jonathan Lima, MD  indapamide (LOZOL) 1.25 MG tablet Take 1 tablet (1.25 mg total) by mouth daily. 12/25/18   Jonathan Lima, MD  NARCAN 4 MG/0.1ML LIQD nasal spray kit Place 1 spray into the nose once.  11/18/17   [provider]  Pitavastatin Calcium 2 MG TABS Take 1 tablet (2 mg total) by mouth daily. 12/25/18   Jonathan Lima, MD  tamsulosin (FLOMAX) 0.4 MG CAPS capsule TAKE 1 CAPSULE BY  MOUTH EVERY DAY 12/14/17   Jonathan Lima, MD    Family History    Family History  Problem Relation Age of Onset  . Stroke Mother   . Arthritis Mother   . Stroke Father   . Hypertension Father   . Cancer Neg Hx   . COPD Neg Hx   . Heart disease Neg Hx   . Kidney disease Neg Hx    He indicated that his mother is deceased. He indicated that his father is deceased. He indicated that the status of his neg hx is unknown.  Social History    Social History   Socioeconomic History  . Marital status: Divorced    Spouse name: Not on file  . Number of children: 1  . Years of education: Not on file  . Highest education level: Not on file  Occupational History    Employer: DISABLED  Social Needs  . Financial resource strain: Not on file  . Food insecurity    Worry: Not on file    Inability: Not on file  . Transportation needs    Medical: Not on file    Non-medical: Not on file  Tobacco Use  . Smoking status: Current Some Day Smoker    Packs/day: 0.10    Years: 30.00    Pack years: 3.00    Types: Cigarettes, Cigars  . Smokeless tobacco: Never Used  . Tobacco comment: hsa been quit sincie 08/29/2014   Substance and Sexual Activity  . Alcohol use: No  . Drug use: Yes    Types: Marijuana    Comment:  2-3 days ago   . Sexual activity: Not on file  Lifestyle  . Physical activity    Days per week: Not on file    Minutes per session: Not on file  . Stress: Not on file  Relationships  . Social Herbalist on phone: Not on file    Gets together: Not on file    Attends religious service: Not on file    Active member of club or organization: Not on file    Attends meetings of clubs or organizations: Not on file    Relationship status: Not on file  . Intimate partner violence    Fear of current or ex partner: Not on file    Emotionally abused: Not on file    Physically abused: Not on file    Forced sexual activity: Not on file  Other Topics Concern  . Not on file   Social History Narrative   Lives with wife.     Review of Systems    General:  No chills, fever, night sweats or weight changes.  Cardiovascular:  No chest pain, dyspnea on exertion, edema, orthopnea, palpitations, paroxysmal nocturnal dyspnea. Dermatological: No rash, lesions/masses Respiratory: No cough, dyspnea Urologic: No hematuria, dysuria Abdominal:   No nausea, vomiting, diarrhea, bright red blood  per rectum, melena, or hematemesis Neurologic:  No visual changes, wkns, changes in mental status. All other systems reviewed and are otherwise negative except as noted above.  Physical Exam    VS:  BP 130/84   Temp (!) 96.8 F (36 C)   Ht 6' 1.5" (1.867 m)   Wt 194 lb (88 kg)   SpO2 99%   BMI 25.25 kg/m  , BMI Body mass index is 25.25 kg/m. GEN: Well nourished, well developed, in no acute distress. HEENT: normal. Neck: Supple, no JVD, carotid bruits, or masses. Cardiac: RRR, no murmurs, rubs, or gallops. No clubbing, cyanosis, edema.  Radials/DP/PT 2+ and equal bilaterally.  Respiratory:  Respirations regular and unlabored, clear to auscultation bilaterally. GI: Soft, nontender, nondistended, BS + x 4. MS: no deformity or atrophy. Skin: warm and dry, no rash. Neuro:  Strength and sensation are intact. Psych: Normal affect.  Accessory Clinical Findings    ECG personally reviewed by me today-sinus bradycardia 54 bpm- No acute changes  EKG 12/25/2018 Sinus bradycardia 47 bpm  EKG 05/16/2018 Sinus bradycardia 53 bpm  Echocardiogram 08/04/2011  Study Conclusions   - Left ventricle: The cavity size was normal. Wall thickness  was normal. Systolic function was normal. The estimated  ejection fraction was in the range of 55% to 60%. Wall  motion was normal; there were no regional wall motion  abnormalities. Features are consistent with a pseudonormal  left ventricular filling pattern, with concomitant  abnormal relaxation and increased filling pressure  (grade  2 diastolic dysfunction).  - Aortic valve: There was no stenosis.  - Mitral valve: There was mitral valve prolapse. Trivial  regurgitation.  - Left atrium: The atrium was mildly dilated.  - Right ventricle: The cavity size was normal. Systolic  function was normal.  - Tricuspid valve: Peak RV-RA gradient: 25m Hg (S).  - Pulmonary arteries: PA peak pressure: 296mHg (S).  - Inferior vena cava: The vessel was normal in size; the  respirophasic diameter changes were in the normal range (=  50%); findings are consistent with normal central venous  pressure.  Impressions:   - Normal LV size and systolic function, EF 5594-76%Moderate  diastolic dysfunction. Mitral valve prolapse with trivial  mitral regurgitation. RV normal size and systolic  function.   Assessment & Plan   1.  Diastolic heart failure-echocardiogram 07/2011 LVEF 55 to 6054%grade 2 diastolic dysfunction, and mitral valve prolapse with trivial mitral regurgitation.  No shortness of breath today and continues to advance his physical activity/exercise regimen. Continue heart healthy low-sodium diet Maintain physical activity Due to the length of time between patient's visits, patient case reviewed with DOD.  2.  Essential hypertension-BP today 130/84 Continue heart healthy low-sodium diet Maintain physical activity  3.  Dyslipidemia-12/25/2018: Cholesterol 189; HDL 47.50; LDL Cholesterol 106; Triglycerides 177.0; VLDL 35.4 . Monitored by PCP Continue pravastatin 2 mg daily  followed by PCP  Disposition: Follow-up with Dr. HoPercival Spanishs needed.  JeDeberah PeltonNP-C 02/25/2019, 1:01 PM

## 2019-02-25 NOTE — Patient Instructions (Addendum)
Medication Instructions:  START Pitavastatin 2mg  Take 1 tablet once a day  If you need a refill on your cardiac medications before your next appointment, please call your pharmacy.   Lab work: None  If you have labs (blood work) drawn today and your tests are completely normal, you will receive your results only by: Marland Kitchen MyChart Message (if you have MyChart) OR . A paper copy in the mail If you have any lab test that is abnormal or we need to change your treatment, we will call you to review the results.  Testing/Procedures: None   Follow-Up: At [facility], you and your health needs are our priority.  As part of our continuing mission to provide you with exceptional heart care, we have created designated Provider Care Teams.  These Care Teams include your primary Cardiologist (physician) and Advanced Practice Providers (APPs -  Physician Assistants and Nurse Practitioners) who all work together to provide you with the care you need, when you need it.  . Your physician recommends that you schedule a follow-up appointment in: FOLLOW UP ON A AS NEEDED BASIS  Any Other Special Instructions Will Be Listed Below (If Applicable).

## 2019-03-19 ENCOUNTER — Other Ambulatory Visit: Payer: Self-pay | Admitting: Internal Medicine

## 2019-03-19 DIAGNOSIS — I10 Essential (primary) hypertension: Secondary | ICD-10-CM

## 2019-03-19 DIAGNOSIS — I5189 Other ill-defined heart diseases: Secondary | ICD-10-CM

## 2019-03-19 DIAGNOSIS — I519 Heart disease, unspecified: Secondary | ICD-10-CM

## 2019-03-21 ENCOUNTER — Other Ambulatory Visit: Payer: Self-pay | Admitting: Internal Medicine

## 2019-03-21 DIAGNOSIS — M48061 Spinal stenosis, lumbar region without neurogenic claudication: Secondary | ICD-10-CM

## 2019-03-21 DIAGNOSIS — M87052 Idiopathic aseptic necrosis of left femur: Secondary | ICD-10-CM

## 2019-03-21 DIAGNOSIS — M87051 Idiopathic aseptic necrosis of right femur: Secondary | ICD-10-CM

## 2019-03-21 MED ORDER — HYDROCODONE-ACETAMINOPHEN 10-325 MG PO TABS: 1.0000 | ORAL_TABLET | Freq: Four times a day (QID) | ORAL | 0 refills | Status: DC | PRN

## 2019-03-21 NOTE — Telephone Encounter (Signed)
Copied from [place] [id number]. Topic: Quick Communication - Rx Refill/Question >> Mar 21, 2019  9:16 AM Jonathan [name] wrote: Medication: HYDROcodone-acetaminophen (NORCO) 10-325 MG tablet   Has the patient contacted their pharmacy? Yes.   (Agent: If no, request that the patient contact the pharmacy for the refill.) (Agent: If yes, when and what did the pharmacy advise?)  Preferred Pharmacy (with phone number or street name): [id number] [address], [place]. [phone] (Phone) [phone] (Fax)    Agent: Please be advised that RX refills may take up to 3 business days. We ask that you follow-up with your pharmacy.

## 2019-03-21 NOTE — Telephone Encounter (Signed)
Requested medication (s) are due for refill today: yes  Requested medication (s) are on the active medication list: yes  Last refill:  02/23/2019  Future visit scheduled: no  Notes to clinic:  Refill cannot be delegated    Requested Prescriptions  Pending Prescriptions Disp Refills   HYDROcodone-acetaminophen (NORCO) 10-325 MG tablet 90 tablet 0    Sig: Take 1 tablet by mouth every 6 (six) hours as needed for moderate pain.     Not Delegated - Analgesics:  Opioid Agonist Combinations Failed - 03/21/2019  9:22 AM      Failed - This refill cannot be delegated      Passed - Urine Drug Screen completed in last 360 days.      Passed - Valid encounter within last 6 months    Recent Outpatient Visits          2 months ago Essential hypertension   [place], Thomas L, MD   7 months ago Essential hypertension   [place], James W, MD   9 months ago Essential hypertension   [facility] -Mayer Camel, MD   1 year ago Essential hypertension   [place], Thomas L, MD   1 year ago Need for Tdap vaccination   [facility] -Mayer Camel, MD

## 2019-03-23 ENCOUNTER — Other Ambulatory Visit: Payer: Self-pay | Admitting: Internal Medicine

## 2019-03-23 DIAGNOSIS — I5189 Other ill-defined heart diseases: Secondary | ICD-10-CM

## 2019-03-23 DIAGNOSIS — I10 Essential (primary) hypertension: Secondary | ICD-10-CM

## 2019-03-23 DIAGNOSIS — I519 Heart disease, unspecified: Secondary | ICD-10-CM

## 2019-03-24 ENCOUNTER — Other Ambulatory Visit: Payer: Self-pay | Admitting: Internal Medicine

## 2019-04-17 ENCOUNTER — Telehealth: Payer: Self-pay | Admitting: Internal Medicine

## 2019-04-17 NOTE — Telephone Encounter (Unsigned)
Copied from [place] [id number] [phone]. Topic: Quick Communication - Rx Refill/Question >> Apr 17, 2019  4:39 PM Yvette Rack wrote: Medication: HYDROcodone-acetaminophen (NORCO) 10-325 MG tablet  Has the patient contacted their pharmacy? no  Preferred Pharmacy (with phone number or street name): [id number] [address], [place]. [phone] (Phone) [phone] (Fax)  Agent: Please be advised that RX refills may take up to 3 business days. We ask that you follow-up with your pharmacy.

## 2019-04-18 ENCOUNTER — Ambulatory Visit: Payer: Medicare Other | Admitting: Internal Medicine

## 2019-04-24 ENCOUNTER — Encounter: Payer: Self-pay | Admitting: Internal Medicine

## 2019-04-24 ENCOUNTER — Telehealth: Payer: Self-pay | Admitting: Internal Medicine

## 2019-04-24 ENCOUNTER — Other Ambulatory Visit: Payer: Self-pay

## 2019-04-24 ENCOUNTER — Ambulatory Visit (INDEPENDENT_AMBULATORY_CARE_PROVIDER_SITE_OTHER): Payer: Medicare Other | Admitting: Internal Medicine

## 2019-04-24 ENCOUNTER — Other Ambulatory Visit (INDEPENDENT_AMBULATORY_CARE_PROVIDER_SITE_OTHER): Payer: Medicare Other

## 2019-04-24 VITALS — BP 154/100 | HR 60 | Temp 97.8°F | Resp 16 | Ht 73.5 in | Wt 198.0 lb

## 2019-04-24 DIAGNOSIS — M48061 Spinal stenosis, lumbar region without neurogenic claudication: Secondary | ICD-10-CM | POA: Diagnosis not present

## 2019-04-24 DIAGNOSIS — R972 Elevated prostate specific antigen [PSA]: Secondary | ICD-10-CM | POA: Diagnosis not present

## 2019-04-24 DIAGNOSIS — J0100 Acute maxillary sinusitis, unspecified: Secondary | ICD-10-CM | POA: Diagnosis not present

## 2019-04-24 DIAGNOSIS — M87052 Idiopathic aseptic necrosis of left femur: Secondary | ICD-10-CM

## 2019-04-24 DIAGNOSIS — I10 Essential (primary) hypertension: Secondary | ICD-10-CM | POA: Diagnosis not present

## 2019-04-24 DIAGNOSIS — M87051 Idiopathic aseptic necrosis of right femur: Secondary | ICD-10-CM

## 2019-04-24 DIAGNOSIS — J301 Allergic rhinitis due to pollen: Secondary | ICD-10-CM | POA: Diagnosis not present

## 2019-04-24 LAB — BASIC METABOLIC PANEL
BUN: 10 mg/dL (ref 6–23)
CO2: 26 mEq/L (ref 19–32)
Calcium: 9.5 mg/dL (ref 8.4–10.5)
Chloride: 103 mEq/L (ref 96–112)
Creatinine, Ser: 0.87 mg/dL (ref 0.40–1.50)
GFR: 106.34 mL/min (ref >60.00)
Glucose, Bld: 100 mg/dL — ABNORMAL HIGH (ref 70–99)
Potassium: 4.3 mEq/L (ref 3.5–5.1)
Sodium: 137 mEq/L (ref 135–145)

## 2019-04-24 LAB — PSA: PSA: 2.3

## 2019-04-24 MED ORDER — AMOXICILLIN-POT CLAVULANATE 875-125 MG PO TABS: 1.0000 | ORAL_TABLET | Freq: Two times a day (BID) | ORAL | 0 refills | Status: AC

## 2019-04-24 MED ORDER — HYDROCODONE-ACETAMINOPHEN 10-325 MG PO TABS: 1.0000 | ORAL_TABLET | Freq: Four times a day (QID) | ORAL | 0 refills | Status: DC | PRN

## 2019-04-24 MED ORDER — AZELASTINE-FLUTICASONE 137-50 MCG/ACT NA SUSP: 2.0000 | Freq: Two times a day (BID) | NASAL | 1 refills | Status: DC

## 2019-04-24 MED ORDER — FEXOFENADINE HCL 180 MG PO TABS: 180.0000 mg | ORAL_TABLET | Freq: Every day | ORAL | 1 refills | Status: DC

## 2019-04-24 NOTE — Telephone Encounter (Signed)
Requested medication (s) are due for refill today: yes  Requested medication (s) are on the active medication list: yes  Last refill:  03/21/2019  Future visit scheduled: no  Notes to clinic:  Refill cannot be delegated    Requested Prescriptions  Pending Prescriptions Disp Refills   HYDROcodone-acetaminophen (NORCO) 10-325 MG tablet 90 tablet 0    Sig: Take 1 tablet by mouth every 6 (six) hours as needed for moderate pain.     Not Delegated - Analgesics:  Opioid Agonist Combinations Failed - 04/24/2019 10:17 AM      Failed - This refill cannot be delegated      Passed - Urine Drug Screen completed in last 360 days.      Passed - Valid encounter within last 6 months    Recent Outpatient Visits          Today Seasonal allergic rhinitis due to pollen   [place], Thomas L, MD   4 months ago Essential hypertension   [place], Thomas L, MD   8 months ago Essential hypertension   [place], James W, MD   10 months ago Essential hypertension   [facility] -Mayer Camel, MD   1 year ago Essential hypertension   [facility] -Mayer Camel, MD

## 2019-04-24 NOTE — Telephone Encounter (Signed)
Medication Refill - Medication:  HYDROcodone-acetaminophen (NORCO) 10-325 MG tablet   Has the patient contacted their pharmacy? Yes advised to call office.  Preferred Pharmacy (with phone number or street name):  [id number] [address], [place]. [phone] (Phone) [phone] (Fax     Agent: Please be advised that RX refills may take up to 3 business days. We ask that you follow-up with your pharmacy.

## 2019-04-24 NOTE — Progress Notes (Signed)
Subjective:  Patient ID: Jonathan [name], male    DOB: 07-Nov-1955  Age: 63 y.o. MRN: [id number]  CC: Hypertension, Allergic Rhinitis , and Sinusitis  This visit occurred during the SARS-CoV-2 public health emergency.  Safety protocols were in place, including screening questions prior to the visit, additional usage of staff PPE, and extensive cleaning of exam room while observing appropriate contact time as indicated for disinfecting solutions.    HPI Jonathan [name] presents for f/up - He complains of a 2-week history of facial pain, runny nose with thick yellow-green phlegm, and postnasal drip.  He is not taking the prescribed antihypertensives.  He tells me he does not believe he has hypertension and does not need to take the meds.  Outpatient Medications Prior to Visit  Medication Sig Dispense Refill   amLODipine (NORVASC) 5 MG tablet TAKE 1 TABLET BY MOUTH EVERY DAY 90 tablet 1   Betamethasone Valerate 0.12 % foam APPLY TOPICALLY TO AFFECTED AREA(S) TWICE DAILY 100 g 2   DEXILANT 60 MG capsule TAKE 1 CAPSULE BY MOUTH EVERY DAY 90 capsule 1   dutasteride (AVODART) 0.5 MG capsule TAKE 1 CAPSULE (0.5 MG TOTAL) BY MOUTH DAILY. 90 capsule 1   esomeprazole (NEXIUM) 40 MG capsule Take 40 mg by mouth daily at 12 noon.     indapamide (LOZOL) 1.25 MG tablet TAKE 1 TABLET EVERY DAY 90 tablet 1   NARCAN 4 MG/0.1ML LIQD nasal spray kit Place 1 spray into the nose once.   2   Pitavastatin Calcium 2 MG TABS Take 1 tablet (2 mg total) by mouth daily. 90 tablet 1   tamsulosin (FLOMAX) 0.4 MG CAPS capsule TAKE 1 CAPSULE BY MOUTH EVERY DAY 90 capsule 2   fexofenadine (ALLEGRA) 180 MG tablet Take 60 mg by mouth daily.     HYDROcodone-acetaminophen (NORCO) 10-325 MG tablet Take 1 tablet by mouth every 6 (six) hours as needed for moderate pain. 90 tablet 0   No facility-administered medications prior to visit.     ROS Review of Systems  Constitutional: Negative.  Negative for  chills, fatigue and fever.  HENT: Positive for postnasal drip, rhinorrhea, sinus pressure and sinus pain. Negative for facial swelling, nosebleeds and sneezing.   Eyes: Negative.   Respiratory: Negative for cough, chest tightness and wheezing.   Cardiovascular: Negative for chest pain, palpitations and leg swelling.  Gastrointestinal: Negative for abdominal pain, constipation, diarrhea, nausea and vomiting.  Endocrine: Negative.   Genitourinary: Negative.  Negative for difficulty urinating, dysuria and hematuria.  Musculoskeletal: Positive for arthralgias and back pain. Negative for myalgias.  Skin: Negative.   Neurological: Negative for dizziness, weakness, light-headedness and headaches.  Hematological: Negative for adenopathy. Does not bruise/bleed easily.  Psychiatric/Behavioral: Negative.     Objective:  BP (!) 154/100 (BP Location: Left Arm, Patient Position: Sitting, Cuff Size: Large)    Pulse 60    Temp 97.8 F (36.6 C) (Oral)    Resp 16    Ht 6' 1.5" (1.867 m)    Wt 198 lb (89.8 kg)    SpO2 95%    BMI 25.77 kg/m   BP Readings from Last 3 Encounters:  04/24/19 (!) 154/100  02/25/19 130/84  12/25/18 (!) 152/98    Wt Readings from Last 3 Encounters:  04/24/19 198 lb (89.8 kg)  02/25/19 194 lb (88 kg)  12/25/18 199 lb 12 oz (90.6 kg)    Physical Exam Vitals signs reviewed.  Constitutional:      Appearance:  Normal appearance.  HENT:     Nose: Mucosal edema and rhinorrhea present. Rhinorrhea is purulent.     Right Nostril: No epistaxis.     Left Nostril: No epistaxis.     Right Sinus: Maxillary sinus tenderness present. No frontal sinus tenderness.     Left Sinus: No maxillary sinus tenderness or frontal sinus tenderness.  Eyes:     General: No scleral icterus.    Conjunctiva/sclera: Conjunctivae normal.  Neck:     Musculoskeletal: Neck supple.  Cardiovascular:     Rate and Rhythm: Normal rate and regular rhythm.     Heart sounds: No murmur.  Pulmonary:      Effort: Pulmonary effort is normal.     Breath sounds: No stridor. No wheezing, rhonchi or rales.  Abdominal:     General: Abdomen is flat.     Palpations: There is no mass.     Tenderness: There is no abdominal tenderness.  Musculoskeletal: Normal range of motion.  Lymphadenopathy:     Cervical: No cervical adenopathy.  Skin:    General: Skin is warm and dry.  Neurological:     General: No focal deficit present.     Mental Status: He is alert.  Psychiatric:        Mood and Affect: Mood normal.        Behavior: Behavior normal.     Lab Results  Component Value Date   WBC 3.4 (L) 12/25/2018   HGB 15.2 12/25/2018   HCT 45.4 12/25/2018   PLT 220.0 12/25/2018   GLUCOSE 100 (H) 04/24/2019   CHOL 189 12/25/2018   TRIG 177.0 (H) 12/25/2018   HDL 47.50 12/25/2018   LDLDIRECT 131.6 07/14/2011   LDLCALC 106 (H) 12/25/2018   ALT 15 12/25/2018   AST 15 12/25/2018   NA 137 04/24/2019   K 4.3 04/24/2019   CL 103 04/24/2019   CREATININE 0.87 04/24/2019   BUN 10 04/24/2019   CO2 26 04/24/2019   TSH 1.09 12/25/2018   PSA 2.3 04/24/2019   INR 0.97 09/08/2014   HGBA1C 5.2 10/13/2014    Ct Head Wo Contrast  Result Date: 05/15/2018 CLINICAL DATA:  63 year old male weak and tired for 2 days. Initial encounter. EXAM: CT HEAD WITHOUT CONTRAST TECHNIQUE: Contiguous axial images were obtained from the base of the skull through the vertex without intravenous contrast. COMPARISON:  12/28/2006 head CT. FINDINGS: Brain: No intracranial hemorrhage or CT evidence of large acute infarct. Chronic microvascular changes. Anterior right frontal calvarial 1.2 cm area of bony overgrowth has changed minimally from the prior examination when this measured up to 1 cm. Minimal impression upon anteromedial right frontal lobe without surrounding vasogenic edema. Question exostosis versus en plaque meningioma. Vascular: No acute hyperdense vessel. Skull: No acute abnormality. Sinuses/Orbits: Remote right  zygomatic arch, right orbital floor, right lateral orbital wall and right maxillary fracture with plate and screws right lateral supraorbital region. Visualized paranasal sinuses are clear. Other: Mastoid air cells and middle ear cavities are clear. IMPRESSION: 1. No intracranial hemorrhage or CT evidence of large acute infarct. 2. Chronic microvascular changes. 3. Anterior right frontal calvarial 1.2 cm area of bony overgrowth has changed minimally from the prior examination when this measured up to 1 cm. Minimal impression upon anteromedial right frontal lobe without surrounding vasogenic edema. Question exostosis versus en plaque meningioma. 4. Remote right zygomatic arch, right orbital floor, right lateral orbital wall and right maxillary fractures. Plate and screws right lateral supraorbital region. Electronically Signed   By:  Genia Del M.D.   On: 05/15/2018 09:03    Assessment & Plan:   Leona was seen today for hypertension, allergic rhinitis  and sinusitis.  Diagnoses and all orders for this visit:  Seasonal allergic rhinitis due to pollen -     Azelastine-Fluticasone (DYMISTA) 137-50 MCG/ACT SUSP; Place 2 Act into the nose 2 (two) times daily. -     fexofenadine (ALLEGRA) 180 MG tablet; Take 1 tablet (180 mg total) by mouth daily.  Acute non-recurrent maxillary sinusitis -     amoxicillin-clavulanate (AUGMENTIN) 875-125 MG tablet; Take 1 tablet by mouth 2 (two) times daily for 10 days.  PSA elevation- His PSA is down to 2.3.  This is a reassuring sign that he does not have prostate cancer. -     PSA, total and free; Future  Essential hypertension- His blood pressure is not adequately well controlled due to noncompliance.  I have informed him he does indeed have hypertension I recommend that he take the indapamide and amlodipine as prescribed.  He refused a flu vaccine today. -     Basic metabolic panel; Future  Spinal stenosis of lumbar region without neurogenic claudication -      HYDROcodone-acetaminophen (NORCO) 10-325 MG tablet; Take 1 tablet by mouth every 6 (six) hours as needed for moderate pain.  Avascular necrosis of bones of both hips (HCC) -     HYDROcodone-acetaminophen (NORCO) 10-325 MG tablet; Take 1 tablet by mouth every 6 (six) hours as needed for moderate pain.   I have changed Cassidy L. Long [name]'s fexofenadine. I am also having him start on Azelastine-Fluticasone and amoxicillin-clavulanate. Additionally, I am having him maintain his Betamethasone Valerate, tamsulosin, Narcan, esomeprazole, dutasteride, Pitavastatin Calcium, indapamide, amLODipine, Dexilant, and HYDROcodone-acetaminophen.  Meds ordered this encounter  Medications   Azelastine-Fluticasone (DYMISTA) 137-50 MCG/ACT SUSP    Sig: Place 2 Act into the nose 2 (two) times daily.    Dispense:  69 g    Refill:  1   fexofenadine (ALLEGRA) 180 MG tablet    Sig: Take 1 tablet (180 mg total) by mouth daily.    Dispense:  90 tablet    Refill:  1   amoxicillin-clavulanate (AUGMENTIN) 875-125 MG tablet    Sig: Take 1 tablet by mouth 2 (two) times daily for 10 days.    Dispense:  20 tablet    Refill:  0   HYDROcodone-acetaminophen (NORCO) 10-325 MG tablet    Sig: Take 1 tablet by mouth every 6 (six) hours as needed for moderate pain.    Dispense:  90 tablet    Refill:  0     Follow-up: Return in about 6 weeks (around 06/05/2019).  Scarlette Calico, MD

## 2019-04-24 NOTE — Patient Instructions (Signed)

## 2019-04-26 LAB — PSA, TOTAL AND FREE
PSA, % Free: 22 % (calc) — ABNORMAL LOW (ref >25)
PSA, Free: 0.5 ng/mL
PSA, Total: 2.3 ng/mL (ref <=4.0)

## 2019-05-14 ENCOUNTER — Other Ambulatory Visit: Payer: Self-pay | Admitting: Internal Medicine

## 2019-05-14 DIAGNOSIS — N138 Other obstructive and reflux uropathy: Secondary | ICD-10-CM

## 2019-05-20 ENCOUNTER — Telehealth: Payer: Self-pay | Admitting: Internal Medicine

## 2019-05-20 NOTE — Telephone Encounter (Signed)
HYDROcodone-acetaminophen (NORCO) 10-325 MG tablet  [id number] [place] [place] [facility] [place]. Phone:  [phone]  Fax:  [phone]

## 2019-05-23 ENCOUNTER — Other Ambulatory Visit: Payer: Self-pay | Admitting: Internal Medicine

## 2019-05-23 DIAGNOSIS — M48061 Spinal stenosis, lumbar region without neurogenic claudication: Secondary | ICD-10-CM

## 2019-05-23 DIAGNOSIS — M87051 Idiopathic aseptic necrosis of right femur: Secondary | ICD-10-CM

## 2019-05-23 MED ORDER — HYDROCODONE-ACETAMINOPHEN 10-325 MG PO TABS: 1.0000 | ORAL_TABLET | Freq: Four times a day (QID) | ORAL | 0 refills | Status: DC | PRN

## 2019-06-18 ENCOUNTER — Other Ambulatory Visit: Payer: Self-pay | Admitting: Internal Medicine

## 2019-06-18 DIAGNOSIS — M87052 Idiopathic aseptic necrosis of left femur: Secondary | ICD-10-CM

## 2019-06-18 DIAGNOSIS — M87051 Idiopathic aseptic necrosis of right femur: Secondary | ICD-10-CM

## 2019-06-18 DIAGNOSIS — M48061 Spinal stenosis, lumbar region without neurogenic claudication: Secondary | ICD-10-CM

## 2019-06-18 NOTE — Telephone Encounter (Signed)
Requested medication (s) are due for refill today: yes  Requested medication (s) are on the active medication list: yes  Last refill:  05/23/2019  Future visit scheduled: no  Notes to clinic: not delegated   Requested Prescriptions  Pending Prescriptions Disp Refills   HYDROcodone-acetaminophen (NORCO) 10-325 MG tablet 90 tablet 0    Sig: Take 1 tablet by mouth every 6 (six) hours as needed for moderate pain.      Not Delegated - Analgesics:  Opioid Agonist Combinations Failed - 06/18/2019  9:17 AM      Failed - This refill cannot be delegated      Passed - Urine Drug Screen completed in last 360 days.      Passed - Valid encounter within last 6 months    Recent Outpatient Visits           1 month ago Seasonal allergic rhinitis due to pollen   [facility] -Mayer Camel, MD   5 months ago Essential hypertension   [place], Thomas L, MD   10 months ago Essential hypertension   [facility] -Georges Mouse, MD   1 year ago Essential hypertension   [facility] -Mayer Camel, MD   1 year ago Essential hypertension   [place], Thomas L, MD

## 2019-06-18 NOTE — Telephone Encounter (Signed)
Requested medication (s) are due for refill today: yes  Requested medication (s) are on the active medication list: yes  Last refill: 05/23/2019  Future visit scheduled:no  Notes to clinic:  not delegated    Requested Prescriptions  Pending Prescriptions Disp Refills   HYDROcodone-acetaminophen (NORCO) 10-325 MG tablet 90 tablet 0    Sig: Take 1 tablet by mouth every 6 (six) hours as needed for moderate pain.      Not Delegated - Analgesics:  Opioid Agonist Combinations Failed - 06/18/2019  9:17 AM      Failed - This refill cannot be delegated      Passed - Urine Drug Screen completed in last 360 days.      Passed - Valid encounter within last 6 months    Recent Outpatient Visits           1 month ago Seasonal allergic rhinitis due to pollen   [facility] -Mayer Camel, MD   5 months ago Essential hypertension   [place], Thomas L, MD   10 months ago Essential hypertension   [facility] -Georges Mouse, MD   1 year ago Essential hypertension   [facility] -Mayer Camel, MD   1 year ago Essential hypertension   [place] [facility], Thomas L, MD

## 2019-06-18 NOTE — Telephone Encounter (Signed)
Medication Refill - Medication: hydrocodone   Has the patient contacted their pharmacy? Yes.   (Agent: If no, request that the patient contact the pharmacy for the refill.) (Agent: If yes, when and what did the pharmacy advise?)  Preferred Pharmacy (with phone number or street name):  [id number] [address], [place] [address]  Phone: [phone] Fax: [phone]  Not a 24 hour pharmacy; exact hours not known.     Agent: Please be advised that RX refills may take up to 3 business days. We ask that you follow-up with your pharmacy.

## 2019-06-21 MED ORDER — HYDROCODONE-ACETAMINOPHEN 10-325 MG PO TABS: 1.0000 | ORAL_TABLET | Freq: Four times a day (QID) | ORAL | 0 refills | Status: DC | PRN

## 2019-07-14 ENCOUNTER — Ambulatory Visit: Payer: Medicare Other | Attending: Internal Medicine

## 2019-07-14 DIAGNOSIS — Z23 Encounter for immunization: Secondary | ICD-10-CM

## 2019-07-14 NOTE — Progress Notes (Signed)
   Covid-19 Vaccination Clinic  Name:  Jonathan [name]    MRN: [id number] DOB: 1956-05-06  07/14/2019  [name] was observed post Covid-19 immunization for 15 minutes without incidence. He was provided with Vaccine Information Sheet and instruction to access the V-Safe system.   [name] was instructed to call 911 with any severe reactions post vaccine: Marland Kitchen Difficulty breathing  . Swelling of your face and throat  . A fast heartbeat  . A bad rash all over your body  . Dizziness and weakness    Immunizations Administered    Name Date Dose VIS Date Route   Pfizer COVID-19 Vaccine 07/14/2019  8:44 AM 0.3 mL 05/10/2019 Intramuscular   Manufacturer: [place]   Lot: [id number]   [place]: [id number]

## 2019-07-16 ENCOUNTER — Telehealth: Payer: Self-pay

## 2019-07-16 NOTE — Telephone Encounter (Signed)
New message    Medication Requested:  HYDROcodone-acetaminophen ([place]) 10-325 MG tablet  Is medication on med list: Yes  (if no, inform pt they may need an appointment)  Is medication a controled: Yes  (yes = last OV with PCP)   Last visit with PCP: 04/24/2019   [facility] ([facility] [place] [address]

## 2019-07-18 ENCOUNTER — Other Ambulatory Visit: Payer: Self-pay | Admitting: Internal Medicine

## 2019-07-18 DIAGNOSIS — M87051 Idiopathic aseptic necrosis of right femur: Secondary | ICD-10-CM

## 2019-07-18 DIAGNOSIS — M48061 Spinal stenosis, lumbar region without neurogenic claudication: Secondary | ICD-10-CM

## 2019-07-19 ENCOUNTER — Other Ambulatory Visit: Payer: Self-pay | Admitting: Internal Medicine

## 2019-07-19 DIAGNOSIS — Z79891 Long term (current) use of opiate analgesic: Secondary | ICD-10-CM

## 2019-07-19 DIAGNOSIS — M48061 Spinal stenosis, lumbar region without neurogenic claudication: Secondary | ICD-10-CM

## 2019-07-19 DIAGNOSIS — M87051 Idiopathic aseptic necrosis of right femur: Secondary | ICD-10-CM

## 2019-07-19 MED ORDER — HYDROCODONE-ACETAMINOPHEN 10-325 MG PO TABS: 1.0000 | ORAL_TABLET | Freq: Four times a day (QID) | ORAL | 0 refills | Status: DC | PRN

## 2019-07-19 MED ORDER — NARCAN 4 MG/0.1ML NA LIQD: 1.0000 | Freq: Once | NASAL | 2 refills | Status: AC

## 2019-08-06 ENCOUNTER — Ambulatory Visit: Payer: Medicare Other | Attending: Internal Medicine

## 2019-08-06 DIAGNOSIS — Z23 Encounter for immunization: Secondary | ICD-10-CM

## 2019-08-06 NOTE — Progress Notes (Signed)
   Covid-19 Vaccination Clinic  Name:  Jonathan [name]    MRN: [id number] DOB: [birth year]  08/06/2019  [name] was observed post Covid-19 immunization for 15 minutes without incident. He was provided with Vaccine Information Sheet and instruction to access the V-Safe system.   [name] was instructed to call 911 with any severe reactions post vaccine: Marland Kitchen Difficulty breathing  . Swelling of face and throat  . A fast heartbeat  . A bad rash all over body  . Dizziness and weakness   Immunizations Administered    Name Date Dose VIS Date Route   Pfizer COVID-19 Vaccine 08/06/2019  8:15 AM 0.3 mL 05/10/2019 Intramuscular   Manufacturer: [place]   Lot: [id number]   [place]: [id number]

## 2019-08-14 ENCOUNTER — Ambulatory Visit: Payer: Medicare Other | Admitting: Physician Assistant

## 2019-08-15 ENCOUNTER — Other Ambulatory Visit (INDEPENDENT_AMBULATORY_CARE_PROVIDER_SITE_OTHER): Payer: Medicare Other

## 2019-08-15 ENCOUNTER — Encounter: Payer: Self-pay | Admitting: Nurse Practitioner

## 2019-08-15 ENCOUNTER — Ambulatory Visit (INDEPENDENT_AMBULATORY_CARE_PROVIDER_SITE_OTHER): Payer: Medicare Other | Admitting: Nurse Practitioner

## 2019-08-15 VITALS — BP 130/64 | HR 68 | Temp 98.0°F | Ht 72.5 in | Wt 203.0 lb

## 2019-08-15 DIAGNOSIS — K219 Gastro-esophageal reflux disease without esophagitis: Secondary | ICD-10-CM | POA: Diagnosis not present

## 2019-08-15 DIAGNOSIS — K5900 Constipation, unspecified: Secondary | ICD-10-CM

## 2019-08-15 DIAGNOSIS — Z1211 Encounter for screening for malignant neoplasm of colon: Secondary | ICD-10-CM | POA: Diagnosis not present

## 2019-08-15 LAB — CBC WITH DIFFERENTIAL/PLATELET
Basophils Absolute: 0 10*3/uL (ref 0.0–0.1)
Basophils Relative: 0.7 % (ref 0.0–3.0)
Eosinophils Absolute: 0.1 10*3/uL (ref 0.0–0.7)
Eosinophils Relative: 1.3 % (ref 0.0–5.0)
HCT: 45.6 % (ref 39.0–52.0)
Hemoglobin: 15.5 g/dL (ref 13.0–17.0)
Lymphocytes Relative: 38.1 % (ref 12.0–46.0)
Lymphs Abs: 2.1 10*3/uL (ref 0.7–4.0)
MCHC: 34.1 g/dL (ref 30.0–36.0)
MCV: 83.4 fl (ref 78.0–100.0)
Monocytes Absolute: 0.5 10*3/uL (ref 0.1–1.0)
Monocytes Relative: 9.1 % (ref 3.0–12.0)
Neutro Abs: 2.8 10*3/uL (ref 1.4–7.7)
Neutrophils Relative %: 50.8 % (ref 43.0–77.0)
Platelets: 252 10*3/uL (ref 150.0–400.0)
RBC: 5.46 Mil/uL (ref 4.22–5.81)
RDW: 14 % (ref 11.5–15.5)
WBC: 5.6 10*3/uL (ref 4.0–10.5)

## 2019-08-15 LAB — COMPREHENSIVE METABOLIC PANEL
ALT: 43 U/L (ref 0–53)
AST: 23 U/L (ref 0–37)
Albumin: 4.7 g/dL (ref 3.5–5.2)
Alkaline Phosphatase: 77 U/L (ref 39–117)
BUN: 16 mg/dL (ref 6–23)
CO2: 27 mEq/L (ref 19–32)
Calcium: 9.7 mg/dL (ref 8.4–10.5)
Chloride: 101 mEq/L (ref 96–112)
Creatinine, Ser: 1.04 mg/dL (ref 0.40–1.50)
GFR: 86.46 mL/min (ref >60.00)
Glucose, Bld: 116 mg/dL — ABNORMAL HIGH (ref 70–99)
Potassium: 4.3 mEq/L (ref 3.5–5.1)
Sodium: 135 mEq/L (ref 135–145)
Total Bilirubin: 0.4 mg/dL (ref 0.2–1.2)
Total Protein: 7.7 g/dL (ref 6.0–8.3)

## 2019-08-15 MED ORDER — NA SULFATE-K SULFATE-MG SULF 17.5-3.13-1.6 GM/177ML PO SOLN: 1.0000 | Freq: Once | ORAL | 0 refills | Status: AC

## 2019-08-15 NOTE — Patient Instructions (Addendum)
If you are age 66 or older, your body mass index should be between 23-30. Your Body mass index is 27.15 kg/m. If this is out of the aforementioned range listed, please consider follow up with your Primary Care Provider.  If you are age 109 or younger, your body mass index should be between 19-25. Your Body mass index is 27.15 kg/m. If this is out of the aformentioned range listed, please consider follow up with your Primary Care Provider.   Your provider has requested that you go to the basement level for lab work before leaving today. Press "B" on the elevator. The lab is located at the first door on the left as you exit the elevator.  We have sent the following medications to your pharmacy for you to pick up at your convenience:  Suprep ( colonoscopy prep)  Please use 1 capful of Miralax in 8 ounces of water as needed at bedtime for constipation.   Due to recent changes in healthcare laws, you may see the results of your imaging and laboratory studies on MyChart before your provider has had a chance to review them.  We understand that in some cases there may be results that are confusing or concerning to you. Not all laboratory results come back in the same time frame and the provider may be waiting for multiple results in order to interpret others.  Please give [place] 48 hours in order for your provider to thoroughly review all the results before contacting the office for clarification of your results.

## 2019-08-15 NOTE — Progress Notes (Signed)
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  He presents today accompanied by his ex-wife.  He wishes to schedule an EGD and colonoscopy.  His ex-wife was recently diagnosed with H. pylori and treated.  He has a history of GERD for which she takes Dexilant 60 mg once daily for least the past 2 to 3 years.  He has intermittent heartburn.  He denies having any stomach pain.  Due to possible exposure to H. Pylori, he wishes to proceed with an EGD at the time of his colonoscopy.  He underwent a colonoscopy 08/29/2011 by Dr. Sharlett Iles which identified a hyperplastic sessile polyp 0.5cm in the ascending colon which was removed and diverticular changes to the left colon. He underwent a Cologuard a few years ago which he reported was negative.  No family history of colorectal cancer.  He is passing a brown to yellowish soft formed bowel movement daily.  He denies any rectal bleeding or melena.  He intermittently takes aspirin 81 mg 2 tabs for 3 consecutive days then will take any aspirin for several weeks.  He has intentionally lost 35 pounds over the past 4 to 5 months by diet and exercise.  He reports eating 1 meal daily.    Past Medical History:  Diagnosis Date  . Anxiety   . Arthritis   . Avascular necrosis of bone (HCC)    left hip   . Back pain, lumbosacral 2000  . Blind right eye   . Colon polyps 08/29/2011   Hyperplastic colon polyps  . Mitral valve prolapse    Past Surgical History:  Procedure Laterality Date  . EYE SURGERY  2007   Fractured bones repaired/blind in right eye  . HERNIA REPAIR     Right inguinal  . SPINE SURGERY    . TOTAL HIP ARTHROPLASTY Right 09/18/2014     Procedure: RIGHT TOTAL HIP ARTHROPLASTY ANTERIOR APPROACH;  Surgeon: Rod Can, MD;  Location: [facility];  Service: Orthopedics;  Laterality: Right;  . TOTAL HIP ARTHROPLASTY Left 04/06/2017   Procedure: LEFT TOTAL HIP ARTHROPLASTY ANTERIOR APPROACH;  Surgeon: Rod Can, MD;  Location: [facility];  Service: Orthopedics;  Laterality: Left;  Needs RNFA    Family history: No family history of esophageal, gastric or colon cancer.  Father with hypertension and stroke.  Mother with stroke.  Social history: He is divorced.  Retired.  1 pint of liquor will last 2 to 3 months.  He smokes 1 pack of cigarettes in 1 month on and off for the past 2 to 3 years.  He smokes marijuana 1-2 times monthly since the age of 75.    Allergies  Allergen Reactions  . Cortisone     REACTION: rash, "act different"  . Lipitor [Atorvastatin Calcium] Other (See Comments)    "side pain"      Outpatient Encounter Medications as of 08/15/2019  Medication Sig  . amLODipine (NORVASC) 5 MG tablet TAKE 1 TABLET BY MOUTH EVERY DAY  . Azelastine-Fluticasone (DYMISTA) 137-50 MCG/ACT SUSP Place 2 Act into the nose 2 (two) times daily.  . Betamethasone Valerate 0.12 % foam APPLY TOPICALLY TO AFFECTED AREA(S) TWICE DAILY  . DEXILANT 60 MG capsule TAKE 1  CAPSULE BY MOUTH EVERY DAY  . dutasteride (AVODART) 0.5 MG capsule TAKE 1 CAPSULE (0.5 MG TOTAL) BY MOUTH DAILY.  Marland Kitchen esomeprazole (NEXIUM) 40 MG capsule Take 40 mg by mouth daily at 12 noon.  . fexofenadine (ALLEGRA) 180 MG tablet Take 1 tablet (180 mg total) by mouth daily.  Marland Kitchen HYDROcodone-acetaminophen (NORCO) 10-325 MG tablet Take 1 tablet by mouth every 6 (six) hours as needed for moderate pain.  . indapamide (LOZOL) 1.25 MG tablet TAKE 1 TABLET EVERY DAY  . Pitavastatin Calcium 2 MG TABS Take 1 tablet (2 mg total) by mouth daily.  . tamsulosin (FLOMAX) 0.4 MG CAPS capsule TAKE 1 CAPSULE BY MOUTH EVERY DAY   No facility-administered encounter medications on file as of  08/15/2019.     REVIEW OF SYSTEMS: All other systems reviewed and negative except where noted in the History of Present Illness.   PHYSICAL EXAM: BP 130/64   Pulse 68   Temp 98 F (36.7 C)   Ht 6' 0.5" (1.842 m)   Wt 203 lb (92.1 kg)   BMI 27.15 kg/m  General: Well developed  64 year old male in no acute distress. Head: Normocephalic and atraumatic. Eyes: Sclerae non-icteric, conjunctive pink. Blind right eye with deformity.  Ears: Normal auditory acuity. Mouth: Dentition intact. No ulcers or lesions.  Neck: Supple, no lymphadenopathy or thyromegaly.  Lungs: Clear bilaterally to auscultation without wheezes, crackles or rhonchi. Heart: Regular rate and rhythm. No murmur, rub or gallop appreciated.  Abdomen: Soft, protuberant but not tense. No masses. No hepatosplenomegaly. Normoactive bowel sounds x 4 quadrants. Mid abdominal scar noted.  Rectal: Deferred.  Musculoskeletal: Symmetrical with no gross deformities. Skin: Warm and dry. No rash or lesions on visible extremities. Extremities: No edema. Neurological: Alert oriented x 4, no focal deficits.  Psychological:  Alert and cooperative. Normal mood and affect.  ASSESSMENT AND PLAN:  33.  64 year old male with a history of GERD with possible recent exposure to H. pylori presents to schedule an EGD -EGD benefits and risks discussed including risk with sedation, risk of bleeding, perforation and infection  -Continue Dexilant 60 mg p.o. daily  2.  History of hyperplastic colon polyp -Colonoscopy benefits and risks discussed including risk with sedation, risk of bleeding, perforation and infection   3.  Protuberant abdomen on exam, patient does not assess any change to his abdomen.  He has intentionally lost 35 pounds in the past 4 to 5 months by diet and exercise.  He does not  feel constipated. -MiraLAX to increase stool output -Consider abdominal imaging if EGD and colonoscopy negative -CBC and CMP          CC:   Janith Lima, MD

## 2019-08-19 ENCOUNTER — Ambulatory Visit (INDEPENDENT_AMBULATORY_CARE_PROVIDER_SITE_OTHER): Payer: Medicare Other | Admitting: Internal Medicine

## 2019-08-19 ENCOUNTER — Encounter: Payer: Self-pay | Admitting: Internal Medicine

## 2019-08-19 ENCOUNTER — Other Ambulatory Visit: Payer: Self-pay

## 2019-08-19 VITALS — BP 126/82 | HR 68 | Temp 97.8°F | Resp 16 | Ht 72.5 in | Wt 200.4 lb

## 2019-08-19 DIAGNOSIS — I10 Essential (primary) hypertension: Secondary | ICD-10-CM

## 2019-08-19 DIAGNOSIS — M87051 Idiopathic aseptic necrosis of right femur: Secondary | ICD-10-CM

## 2019-08-19 DIAGNOSIS — M87052 Idiopathic aseptic necrosis of left femur: Secondary | ICD-10-CM | POA: Diagnosis not present

## 2019-08-19 DIAGNOSIS — M48061 Spinal stenosis, lumbar region without neurogenic claudication: Secondary | ICD-10-CM | POA: Diagnosis not present

## 2019-08-19 MED ORDER — HYDROCODONE-ACETAMINOPHEN 10-325 MG PO TABS: 1.0000 | ORAL_TABLET | Freq: Four times a day (QID) | ORAL | 0 refills | Status: DC | PRN

## 2019-08-19 NOTE — Progress Notes (Signed)
Subjective:  Patient ID: Jonathan [name], male    DOB: 1955/07/24  Age: 64 y.o. MRN: [id number]  CC: Osteoarthritis and Hypertension  This visit occurred during the SARS-CoV-2 public health emergency.  Safety protocols were in place, including screening questions prior to the visit, additional usage of staff PPE, and extensive cleaning of exam room while observing appropriate contact time as indicated for disinfecting solutions.    HPI Jonathan [name] presents for f/up - He continues to c/o MSK pain and requests a refill of Norco.   Outpatient Medications Prior to Visit  Medication Sig Dispense Refill  . amLODipine (NORVASC) 5 MG tablet TAKE 1 TABLET BY MOUTH EVERY DAY 90 tablet 1  . Azelastine-Fluticasone (DYMISTA) 137-50 MCG/ACT SUSP Place 2 Act into the nose 2 (two) times daily. 69 g 1  . Betamethasone Valerate 0.12 % foam APPLY TOPICALLY TO AFFECTED AREA(S) TWICE DAILY 100 g 2  . DEXILANT 60 MG capsule TAKE 1 CAPSULE BY MOUTH EVERY DAY 90 capsule 1  . dutasteride (AVODART) 0.5 MG capsule TAKE 1 CAPSULE (0.5 MG TOTAL) BY MOUTH DAILY. 90 capsule 1  . fexofenadine (ALLEGRA) 180 MG tablet Take 1 tablet (180 mg total) by mouth daily. 90 tablet 1  . indapamide (LOZOL) 1.25 MG tablet TAKE 1 TABLET EVERY DAY 90 tablet 1  . Pitavastatin Calcium 2 MG TABS Take 1 tablet (2 mg total) by mouth daily. 90 tablet 1  . HYDROcodone-acetaminophen (NORCO) 10-325 MG tablet Take 1 tablet by mouth every 6 (six) hours as needed for moderate pain. 90 tablet 0   No facility-administered medications prior to visit.    ROS Review of Systems  Gastrointestinal: Negative for abdominal pain, constipation, diarrhea, nausea and vomiting.  Genitourinary: Negative.  Negative for difficulty urinating.  Musculoskeletal: Positive for arthralgias and back pain. Negative for myalgias and neck pain.  Skin: Negative.   Neurological: Negative.  Negative for dizziness, weakness and light-headedness.    Hematological: Negative for adenopathy. Does not bruise/bleed easily.  Psychiatric/Behavioral: Negative.     Objective:  BP 126/82 (BP Location: Left Arm, Patient Position: Sitting, Cuff Size: Large)   Pulse 68   Temp 97.8 F (36.6 C) (Oral)   Resp 16   Ht 6' 0.5" (1.842 m)   Wt 200 lb 6 oz (90.9 kg)   SpO2 98%   BMI 26.80 kg/m   BP Readings from Last 3 Encounters:  08/19/19 126/82  08/15/19 130/64  04/24/19 (!) 154/100    Wt Readings from Last 3 Encounters:  08/19/19 200 lb 6 oz (90.9 kg)  08/15/19 203 lb (92.1 kg)  04/24/19 198 lb (89.8 kg)    Physical Exam Vitals reviewed.  Constitutional:      Appearance: Normal appearance.  HENT:     Nose: Nose normal.     Mouth/Throat:     Mouth: Mucous membranes are moist.     Pharynx: No oropharyngeal exudate.  Eyes:     General: No scleral icterus.    Conjunctiva/sclera: Conjunctivae normal.  Cardiovascular:     Rate and Rhythm: Normal rate and regular rhythm.     Heart sounds: No murmur.  Pulmonary:     Effort: Pulmonary effort is normal.     Breath sounds: No stridor. No wheezing, rhonchi or rales.  Abdominal:     General: Abdomen is flat. Bowel sounds are normal. There is no distension.     Palpations: Abdomen is soft. There is no hepatomegaly, splenomegaly or mass.  Tenderness: There is no abdominal tenderness.  Musculoskeletal:        General: Normal range of motion.     Cervical back: Neck supple.     Right lower leg: No edema.     Left lower leg: No edema.  Lymphadenopathy:     Cervical: No cervical adenopathy.  Skin:    General: Skin is warm and dry.     Coloration: Skin is not pale.  Neurological:     General: No focal deficit present.     Mental Status: He is alert.  Psychiatric:        Mood and Affect: Mood normal.        Behavior: Behavior normal.     Lab Results  Component Value Date   WBC 5.6 08/15/2019   HGB 15.5 08/15/2019   HCT 45.6 08/15/2019   PLT 252.0 08/15/2019   GLUCOSE  116 (H) 08/15/2019   CHOL 189 12/25/2018   TRIG 177.0 (H) 12/25/2018   HDL 47.50 12/25/2018   LDLDIRECT 131.6 07/14/2011   LDLCALC 106 (H) 12/25/2018   ALT 43 08/15/2019   AST 23 08/15/2019   NA 135 08/15/2019   K 4.3 08/15/2019   CL 101 08/15/2019   CREATININE 1.04 08/15/2019   BUN 16 08/15/2019   CO2 27 08/15/2019   TSH 1.09 12/25/2018   PSA 2.3 04/24/2019   INR 0.97 09/08/2014   HGBA1C 5.2 10/13/2014    CT Head Wo Contrast  Result Date: 05/15/2018 CLINICAL DATA:  64 year old male weak and tired for 2 days. Initial encounter. EXAM: CT HEAD WITHOUT CONTRAST TECHNIQUE: Contiguous axial images were obtained from the base of the skull through the vertex without intravenous contrast. COMPARISON:  12/28/2006 head CT. FINDINGS: Brain: No intracranial hemorrhage or CT evidence of large acute infarct. Chronic microvascular changes. Anterior right frontal calvarial 1.2 cm area of bony overgrowth has changed minimally from the prior examination when this measured up to 1 cm. Minimal impression upon anteromedial right frontal lobe without surrounding vasogenic edema. Question exostosis versus en plaque meningioma. Vascular: No acute hyperdense vessel. Skull: No acute abnormality. Sinuses/Orbits: Remote right zygomatic arch, right orbital floor, right lateral orbital wall and right maxillary fracture with plate and screws right lateral supraorbital region. Visualized paranasal sinuses are clear. Other: Mastoid air cells and middle ear cavities are clear. IMPRESSION: 1. No intracranial hemorrhage or CT evidence of large acute infarct. 2. Chronic microvascular changes. 3. Anterior right frontal calvarial 1.2 cm area of bony overgrowth has changed minimally from the prior examination when this measured up to 1 cm. Minimal impression upon anteromedial right frontal lobe without surrounding vasogenic edema. Question exostosis versus en plaque meningioma. 4. Remote right zygomatic arch, right orbital floor,  right lateral orbital wall and right maxillary fractures. Plate and screws right lateral supraorbital region. Electronically Signed   By: Genia Del M.D.   On: 05/15/2018 09:03    Assessment & Plan:   Jonathan [name] was seen today for osteoarthritis and hypertension.  Diagnoses and all orders for this visit:  Essential hypertension- His BP is adequately well controlled.  Spinal stenosis of lumbar region without neurogenic claudication -     HYDROcodone-acetaminophen (NORCO) 10-325 MG tablet; Take 1 tablet by mouth every 6 (six) hours as needed for moderate pain.  Avascular necrosis of bones of both hips (HCC) -     HYDROcodone-acetaminophen (NORCO) 10-325 MG tablet; Take 1 tablet by mouth every 6 (six) hours as needed for moderate pain.   I am having  Johnpatrick L. Klees [name] maintain his Betamethasone Valerate, Pitavastatin Calcium, indapamide, amLODipine, Dexilant, Azelastine-Fluticasone, fexofenadine, dutasteride, and HYDROcodone-acetaminophen.  Meds ordered this encounter  Medications  . HYDROcodone-acetaminophen (NORCO) 10-325 MG tablet    Sig: Take 1 tablet by mouth every 6 (six) hours as needed for moderate pain.    Dispense:  90 tablet    Refill:  0     Follow-up: Return in about 4 months (around 12/19/2019).  Scarlette Calico, MD

## 2019-08-19 NOTE — Patient Instructions (Signed)

## 2019-08-23 NOTE — Progress Notes (Signed)
Reviewed and agree with documentation and assessment and plan. K. Veena Dravon Nott , MD   

## 2019-09-02 ENCOUNTER — Other Ambulatory Visit: Payer: Self-pay

## 2019-09-02 ENCOUNTER — Ambulatory Visit (AMBULATORY_SURGERY_CENTER): Payer: Medicare Other | Admitting: Gastroenterology

## 2019-09-02 ENCOUNTER — Encounter: Payer: Self-pay | Admitting: Gastroenterology

## 2019-09-02 VITALS — BP 87/43 | HR 53 | Temp 96.8°F | Resp 17 | Ht 72.0 in | Wt 203.0 lb

## 2019-09-02 DIAGNOSIS — K219 Gastro-esophageal reflux disease without esophagitis: Secondary | ICD-10-CM | POA: Diagnosis not present

## 2019-09-02 DIAGNOSIS — D123 Benign neoplasm of transverse colon: Secondary | ICD-10-CM

## 2019-09-02 DIAGNOSIS — K222 Esophageal obstruction: Secondary | ICD-10-CM | POA: Diagnosis not present

## 2019-09-02 DIAGNOSIS — K449 Diaphragmatic hernia without obstruction or gangrene: Secondary | ICD-10-CM

## 2019-09-02 DIAGNOSIS — Z1211 Encounter for screening for malignant neoplasm of colon: Secondary | ICD-10-CM

## 2019-09-02 DIAGNOSIS — Z86010 Personal history of colonic polyps: Secondary | ICD-10-CM | POA: Diagnosis not present

## 2019-09-02 DIAGNOSIS — D128 Benign neoplasm of rectum: Secondary | ICD-10-CM | POA: Diagnosis not present

## 2019-09-02 LAB — HM COLONOSCOPY

## 2019-09-02 MED ORDER — SODIUM CHLORIDE 0.9 % IV SOLN: 500.0000 mL | Freq: Once | INTRAVENOUS | Status: DC

## 2019-09-02 NOTE — Patient Instructions (Signed)
YOU HAD AN ENDOSCOPIC PROCEDURE TODAY AT THE [facility]:   Refer to the procedure report that was given to you for any specific questions about what was found during the examination.  If the procedure report does not answer your questions, please call your gastroenterologist to clarify.  If you requested that your care partner not be given the details of your procedure findings, then the procedure report has been included in a sealed envelope for you to review at your convenience later.  YOU SHOULD EXPECT: Some feelings of bloating in the abdomen. Passage of more gas than usual.  Walking can help get rid of the air that was put into your GI tract during the procedure and reduce the bloating. If you had a lower endoscopy (such as a colonoscopy or flexible sigmoidoscopy) you may notice spotting of blood in your stool or on the toilet paper. If you underwent a bowel prep for your procedure, you may not have a normal bowel movement for a few days.  Please Note:  You might notice some irritation and congestion in your nose or some drainage.  This is from the oxygen used during your procedure.  There is no need for concern and it should clear up in a day or so.  SYMPTOMS TO REPORT IMMEDIATELY:   Following lower endoscopy (colonoscopy or flexible sigmoidoscopy):  Excessive amounts of blood in the stool  Significant tenderness or worsening of abdominal pains  Swelling of the abdomen that is new, acute  Fever of 100F or higher   Following upper endoscopy (EGD)  Vomiting of blood or coffee ground material  New chest pain or pain under the shoulder blades  Painful or persistently difficult swallowing  New shortness of breath  Fever of 100F or higher  Black, tarry-looking stools  For urgent or emergent issues, a gastroenterologist can be reached at any hour by calling [phone]. Do not use MyChart messaging for urgent concerns.    DIET:  We do recommend a small meal at first, but  then you may proceed to your regular diet.  Drink plenty of fluids but you should avoid alcoholic beverages for 24 hours.  ACTIVITY:  You should plan to take it easy for the rest of today and you should NOT DRIVE or use heavy machinery until tomorrow (because of the sedation medicines used during the test).    FOLLOW UP: Our staff will call the number listed on your records 48-72 hours following your procedure to check on you and address any questions or concerns that you may have regarding the information given to you following your procedure. If we do not reach you, we will leave a message.  We will attempt to reach you two times.  During this call, we will ask if you have developed any symptoms of COVID 19. If you develop any symptoms (ie: fever, flu-like symptoms, shortness of breath, cough etc.) before then, please call [phone].  If you test positive for Covid 19 in the 2 weeks post procedure, please call and report this information to us.    If any biopsies were taken you will be contacted by phone or by letter within the next 1-3 weeks.  Please call us at [phone] if you have not heard about the biopsies in 3 weeks.    SIGNATURES/CONFIDENTIALITY: You and/or your care partner have signed paperwork which will be entered into your electronic medical record.  These signatures attest to the fact that that the information above on   your After Visit Summary has been reviewed and is understood.  Full responsibility of the confidentiality of this discharge information lies with you and/or your care-partner. 

## 2019-09-02 NOTE — Progress Notes (Signed)
Called to room to assist during endoscopic procedure.  Patient ID and intended procedure confirmed with present staff. Received instructions for my participation in the procedure from the performing physician.  

## 2019-09-02 NOTE — Progress Notes (Signed)
A and O x3. Report to RN. Tolerated MAC anesthesia well.Teeth unchanged after procedure.

## 2019-09-02 NOTE — Op Note (Signed)
[place] Patient Name: Jonathan [name] Procedure Date: 09/02/2019 2:41 PM MRN: [id number] Endoscopist: Mauri Pole , MD Age: 64 Referring MD:  Date of Birth: [birth year] Gender: Male Account #: [id number] Procedure:                Colonoscopy Indications:              High risk colon cancer surveillance: Personal                            history of colonic polyps. Ascending colon (right                            colon) hyperplastic polyp likely SSP in 2013 Medicines:                Monitored Anesthesia Care Procedure:                Pre-Anesthesia Assessment:                           - Prior to the procedure, a History and Physical                            was performed, and patient medications and                            allergies were reviewed. The patient's tolerance of                            previous anesthesia was also reviewed. The risks                            and benefits of the procedure and the sedation                            options and risks were discussed with the patient.                            All questions were answered, and informed consent                            was obtained. Prior Anticoagulants: The patient has                            taken no previous anticoagulant or antiplatelet                            agents. ASA Grade Assessment: II - A patient with                            mild systemic disease. After reviewing the risks                            and benefits, the patient was deemed in  satisfactory condition to undergo the procedure.                           After obtaining informed consent, the colonoscope                            was passed under direct vision. Throughout the                            procedure, the patient's blood pressure, pulse, and                            oxygen saturations were monitored continuously. The                            Colonoscope was  introduced through the anus and                            advanced to the the cecum, identified by                            appendiceal orifice and ileocecal valve. The                            colonoscopy was performed without difficulty. The                            patient tolerated the procedure well. The quality                            of the bowel preparation was excellent. The                            ileocecal valve, appendiceal orifice, and rectum                            were photographed. Scope In: 3:01:17 PM Scope Out: 3:22:11 PM Scope Withdrawal Time: 0 hours 15 minutes 50 seconds  Total Procedure Duration: 0 hours 20 minutes 54 seconds  Findings:                 The perianal and digital rectal examinations were                            normal.                           Four sessile polyps were found in the rectum and                            transverse colon. The polyps were 4 to 7 mm in                            size. These polyps were removed with a cold snare.  Resection and retrieval were complete.                           Multiple small and large-mouthed diverticula were                            found in the sigmoid colon and descending colon.                            Peri-diverticular erythema was seen. Biopsies were                            taken with a cold forceps for histology.                           Non-bleeding internal hemorrhoids were found during                            retroflexion. The hemorrhoids were small. Complications:            No immediate complications. Estimated Blood Loss:     Estimated blood loss was minimal. Impression:               - Four 4 to 7 mm polyps in the rectum and in the                            transverse colon, removed with a cold snare.                            Resected and retrieved.                           - Moderate diverticulosis in the sigmoid colon and                             in the descending colon. Peri-diverticular erythema                            was seen. Biopsied.                           - Non-bleeding internal hemorrhoids. Recommendation:           - Patient has a contact number available for                            emergencies. The signs and symptoms of potential                            delayed complications were discussed with the                            patient. Return to normal activities tomorrow.                            Written discharge instructions were  provided to the                            patient.                           - Resume previous diet.                           - Continue present medications.                           - Await pathology results.                           - Repeat colonoscopy in 3 - 5 years for                            surveillance based on pathology results. Mauri Pole, MD 09/02/2019 3:31:11 PM This report has been signed electronically.

## 2019-09-02 NOTE — Op Note (Signed)
[place] Patient Name: Jonathan [name] Procedure Date: 09/02/2019 2:41 PM MRN: [id number] Endoscopist: Mauri Pole , MD Age: 64 Referring MD:  Date of Birth: 05-19-1956 Gender: Male Account #: [id number] Procedure:                Upper GI endoscopy Indications:              Epigastric abdominal pain, Esophageal reflux                            symptoms that persist despite appropriate therapy Medicines:                Monitored Anesthesia Care Procedure:                Pre-Anesthesia Assessment:                           - Prior to the procedure, a History and Physical                            was performed, and patient medications and                            allergies were reviewed. The patient's tolerance of                            previous anesthesia was also reviewed. The risks                            and benefits of the procedure and the sedation                            options and risks were discussed with the patient.                            All questions were answered, and informed consent                            was obtained. Prior Anticoagulants: The patient has                            taken no previous anticoagulant or antiplatelet                            agents. ASA Grade Assessment: II - A patient with                            mild systemic disease. After reviewing the risks                            and benefits, the patient was deemed in                            satisfactory condition to undergo the procedure.  After obtaining informed consent, the endoscope was                            passed under direct vision. Throughout the                            procedure, the patient's blood pressure, pulse, and                            oxygen saturations were monitored continuously. The                            Endoscope was introduced through the mouth, and                            advanced  to the second part of duodenum. The upper                            GI endoscopy was accomplished without difficulty.                            The patient tolerated the procedure well. Scope In: Scope Out: Findings:                 The Z-line was regular and was found 38 cm from the                            incisors.                           A non-obstructing Schatzki ring was found at the                            gastroesophageal junction.                           The gastroesophageal flap valve was visualized                            endoscopically and classified as Hill Grade IV (no                            fold, wide open lumen, hiatal hernia present).                           A small hiatal hernia was present.                           Patchy mildly erythematous mucosa without bleeding                            was found in the entire examined stomach. Biopsies                            were taken with a cold forceps  for Helicobacter                            pylori testing.                           The examined duodenum was normal. Complications:            No immediate complications. Estimated Blood Loss:     Estimated blood loss was minimal. Impression:               - Z-line regular, 38 cm from the incisors.                           - Non-obstructing Schatzki ring.                           - Gastroesophageal flap valve classified as Hill                            Grade IV (no fold, wide open lumen, hiatal hernia                            present).                           - Small hiatal hernia.                           - Erythematous mucosa in the stomach. Biopsied.                           - Normal examined duodenum. Recommendation:           - Patient has a contact number available for                            emergencies. The signs and symptoms of potential                            delayed complications were discussed with the                             patient. Return to normal activities tomorrow.                            Written discharge instructions were provided to the                            patient.                           - Resume previous diet.                           - Continue present medications.                           -  Await pathology results.                           - See the other procedure note for documentation of                            additional recommendations. Mauri Pole, MD 09/02/2019 3:27:02 PM This report has been signed electronically.

## 2019-09-03 DIAGNOSIS — K621 Rectal polyp: Secondary | ICD-10-CM | POA: Diagnosis not present

## 2019-09-03 DIAGNOSIS — D123 Benign neoplasm of transverse colon: Secondary | ICD-10-CM | POA: Diagnosis not present

## 2019-09-03 NOTE — Addendum Note (Signed)
Addended by: Ernestine Conrad D on: 09/03/2019 08:43 AM   Modules accepted: Orders

## 2019-09-04 ENCOUNTER — Telehealth: Payer: Self-pay

## 2019-09-04 NOTE — Telephone Encounter (Signed)
  Follow up Call-  Call back number 09/02/2019  Post procedure Call Back phone  # [phone]  Permission to leave phone message Yes  Some recent data might be hidden     Patient questions:  Do you have a fever, pain , or abdominal swelling? No. Pain Score  0 *  Have you tolerated food without any problems? Yes.    Have you been able to return to your normal activities? Yes.    Do you have any questions about your discharge instructions: Diet   No. Medications  No. Follow up visit  No.  Do you have questions or concerns about your Care? No.  Actions: * If pain score is 4 or above: No action needed, pain <4. 1. Have you developed a fever since your procedure? no  2.   Have you had an respiratory symptoms (SOB or cough) since your procedure? no  3.   Have you tested positive for COVID 19 since your procedure no  4.   Have you had any family members/close contacts diagnosed with the COVID 19 since your procedure?  no   If yes to any of these questions please route to Joylene John, RN and Erenest Rasher, RN

## 2019-09-04 NOTE — Telephone Encounter (Signed)
First attempt follow up call to pt, lm on vm 

## 2019-09-05 ENCOUNTER — Encounter: Payer: Self-pay | Admitting: General Practice

## 2019-09-05 ENCOUNTER — Ambulatory Visit (INDEPENDENT_AMBULATORY_CARE_PROVIDER_SITE_OTHER): Payer: Medicare Other | Admitting: General Practice

## 2019-09-05 ENCOUNTER — Other Ambulatory Visit: Payer: Self-pay

## 2019-09-05 VITALS — BP 128/84 | HR 68 | Ht 74.5 in | Wt 205.6 lb

## 2019-09-05 DIAGNOSIS — I10 Essential (primary) hypertension: Secondary | ICD-10-CM

## 2019-09-05 DIAGNOSIS — I5189 Other ill-defined heart diseases: Secondary | ICD-10-CM

## 2019-09-05 DIAGNOSIS — I519 Heart disease, unspecified: Secondary | ICD-10-CM | POA: Diagnosis not present

## 2019-09-05 DIAGNOSIS — E785 Hyperlipidemia, unspecified: Secondary | ICD-10-CM

## 2019-09-05 NOTE — Progress Notes (Signed)
Cardiology Clinic Note   Patient Name: Jonathan [name] [name] Date of Encounter: 09/05/2019  Primary Care Provider:  Janith Lima, MD Primary Cardiologist:  Minus Breeding, MD  Patient Profile    Jonathan [name] 64 year old male presents today for follow-up of his hypertension and diastolic heart failure.  Past Medical History    Past Medical History:  Diagnosis Date  . Anxiety   . Arthritis   . Avascular necrosis of bone (HCC)    left hip   . Back pain, lumbosacral 2000  . Blind right eye   . Colon polyps 08/29/2011   Hyperplastic colon polyps  . Mitral valve prolapse    Past Surgical History:  Procedure Laterality Date  . EYE SURGERY  2007   Fractured bones repaired/blind in right eye  . HERNIA REPAIR     Right inguinal  . SPINE SURGERY    . TOTAL HIP ARTHROPLASTY Right 09/18/2014   Procedure: RIGHT TOTAL HIP ARTHROPLASTY ANTERIOR APPROACH;  Surgeon: Rod Can, MD;  Location: [facility];  Service: Orthopedics;  Laterality: Right;  . TOTAL HIP ARTHROPLASTY Left 04/06/2017   Procedure: LEFT TOTAL HIP ARTHROPLASTY ANTERIOR APPROACH;  Surgeon: Rod Can, MD;  Location: [facility];  Service: Orthopedics;  Laterality: Left;  Needs RNFA    Allergies  Allergies  Allergen Reactions  . Cortisone     REACTION: rash, "act different"  . Lipitor [Atorvastatin Calcium] Other (See Comments)    "side pain"    History of Present Illness    [name] was last seen by Dr. Percival Spanish on 07/29/2011.  During that time he was being evaluated due to a abnormal EKG with peaked T wave and poor anterior R wave progression suggestive of an old anterior infarct.  He underwent a treadmill stress test which was abnormal and subsequently underwent a nuclear stress test that showed good exercise capacity, normal blood pressure, no significant ST segment change suggestive of ischemia.  Low risk nuclear study.  His PMH also includes essential hypertension, GERD, tobacco abuse,  hyperglycemia, dyslipidemia and grade 2 diastolic dysfunction.  He presented to the clinic 02/25/19 and stated he felt well.  He worked daily as an Cabin crew and stated every other day he walked 3 miles or more.  He also stated he routinely does calisthenic type activities including push-ups sit ups and jumping jacks, several days a week, without difficulty.  He presented  for follow-up evaluation based on recommendation from his primary care provider.  He underwent colonoscopy on 09/02/2019 without difficulties.  He presents the clinic today for follow-up and states he feels well.  He continues to be very physically active.  He is eating a heart healthy diet.  He underwent colonoscopy procedure and EKG procedure on 09/02/2019.  He states that 4 polyps were removed during his colonoscopy and he is awaiting results.  His blood pressures been well controlled and his EKG today shows normal sinus rhythm.  I will have him follow-up in 1 year.  He denies chest pain, shortness of breath, lower extremity edema, fatigue, palpitations, melena, hematuria, hemoptysis, diaphoresis, weakness, presyncope, syncope, orthopnea, and PND.  Home Medications    Prior to Admission medications   Medication Sig Start Date End Date Taking? Authorizing Provider  amLODipine (NORVASC) 5 MG tablet TAKE 1 TABLET BY MOUTH EVERY DAY 03/23/19   Janith Lima, MD  Azelastine-Fluticasone [facility]) 137-50 MCG/ACT SUSP Place 2 Act into the nose 2 (two) times daily. 04/24/19   Janith Lima,  MD  Betamethasone Valerate 0.12 % foam APPLY TOPICALLY TO AFFECTED AREA(S) TWICE DAILY 02/22/17   Janith Lima, MD  DEXILANT 60 MG capsule TAKE 1 CAPSULE BY MOUTH EVERY DAY 03/24/19   Janith Lima, MD  dutasteride (AVODART) 0.5 MG capsule TAKE 1 CAPSULE (0.5 MG TOTAL) BY MOUTH DAILY. 05/14/19   Janith Lima, MD  fexofenadine (ALLEGRA) 180 MG tablet Take 1 tablet (180 mg total) by mouth daily. 04/24/19   Janith Lima, MD    HYDROcodone-acetaminophen (NORCO) 10-325 MG tablet Take 1 tablet by mouth every 6 (six) hours as needed for moderate pain. 08/19/19   Janith Lima, MD  indapamide (LOZOL) 1.25 MG tablet TAKE 1 TABLET EVERY DAY 03/19/19   Janith Lima, MD  Pitavastatin Calcium 2 MG TABS Take 1 tablet (2 mg total) by mouth daily. 02/25/19   Deberah Pelton, NP    Family History    Family History  Problem Relation Age of Onset  . Stroke Mother   . Arthritis Mother   . Stroke Father   . Hypertension Father   . Cancer Neg Hx   . COPD Neg Hx   . Heart disease Neg Hx   . Kidney disease Neg Hx    He indicated that his mother is deceased. He indicated that his father is deceased. He indicated that the status of his neg hx is unknown.  Social History    Social History   Socioeconomic History  . Marital status: Divorced    Spouse name: Not on file  . Number of children: 1  . Years of education: Not on file  . Highest education level: Not on file  Occupational History    Employer: DISABLED  Tobacco Use  . Smoking status: Current Some Day Smoker    Packs/day: 0.10    Years: 30.00    Pack years: 3.00    Types: Cigarettes, Cigars  . Smokeless tobacco: Never Used  . Tobacco comment: hsa been quit sincie 08/29/2014   Substance and Sexual Activity  . Alcohol use: No  . Drug use: Yes    Types: Marijuana    Comment: 1-2 a week  . Sexual activity: Not on file  Other Topics Concern  . Not on file  Social History Narrative   Lives with wife.   Social Determinants of Health   Financial Resource Strain:   . Difficulty of Paying Living Expenses:   Food Insecurity:   . Worried About Charity fundraiser in the Last Year:   . Arboriculturist in the Last Year:   Transportation Needs:   . Film/video editor (Medical):   Marland Kitchen Lack of Transportation (Non-Medical):   Physical Activity:   . Days of Exercise per Week:   . Minutes of Exercise per Session:   Stress:   . Feeling of Stress :   Social  Connections:   . Frequency of Communication with Friends and Family:   . Frequency of Social Gatherings with Friends and Family:   . Attends Religious Services:   . Active Member of Clubs or Organizations:   . Attends Archivist Meetings:   Marland Kitchen Marital Status:   Intimate Partner Violence:   . Fear of Current or Ex-Partner:   . Emotionally Abused:   Marland Kitchen Physically Abused:   . Sexually Abused:      Review of Systems    General:  No chills, fever, night sweats or weight changes.  Cardiovascular:  No  chest pain, dyspnea on exertion, edema, orthopnea, palpitations, paroxysmal nocturnal dyspnea. Dermatological: No rash, lesions/masses Respiratory: No cough, dyspnea Urologic: No hematuria, dysuria Abdominal:   No nausea, vomiting, diarrhea, bright red blood per rectum, melena, or hematemesis Neurologic:  No visual changes, wkns, changes in mental status. All other systems reviewed and are otherwise negative except as noted above.  Physical Exam    VS:  BP 128/84   Pulse 68   Ht 6' 2.5" (1.892 m)   Wt 205 lb 9.6 oz (93.3 kg)   SpO2 97%   BMI 26.04 kg/m  , BMI Body mass index is 26.04 kg/m. GEN: Well nourished, well developed, in no acute distress. HEENT: normal. Neck: Supple, no JVD, carotid bruits, or masses. Cardiac: RRR, no murmurs, rubs, or gallops. No clubbing, cyanosis, edema.  Radials/DP/PT 2+ and equal bilaterally.  Respiratory:  Respirations regular and unlabored, clear to auscultation bilaterally. GI: Soft, nontender, nondistended, BS + x 4. MS: no deformity or atrophy. Skin: warm and dry, no rash. Neuro:  Strength and sensation are intact. Psych: Normal affect.  Accessory Clinical Findings    ECG personally reviewed by me today-normal sinus rhythm 68 bpm  sinus bradycardia 54 bpm- No acute changes  EKG 12/25/2018 Sinus bradycardia 47 bpm  EKG 05/16/2018 Sinus bradycardia 53 bpm  Echocardiogram 08/04/2011  Study Conclusions   - Left ventricle:  The cavity size was normal. Wall thickness  was normal. Systolic function was normal. The estimated  ejection fraction was in the range of 55% to 60%. Wall  motion was normal; there were no regional wall motion  abnormalities. Features are consistent with a pseudonormal  left ventricular filling pattern, with concomitant  abnormal relaxation and increased filling pressure (grade  2 diastolic dysfunction).  - Aortic valve: There was no stenosis.  - Mitral valve: There was mitral valve prolapse. Trivial  regurgitation.  - Left atrium: The atrium was mildly dilated.  - Right ventricle: The cavity size was normal. Systolic  function was normal.  - Tricuspid valve: Peak RV-RA gradient: 72mm Hg (S).  - Pulmonary arteries: PA peak pressure: 50mm Hg (S).  - Inferior vena cava: The vessel was normal in size; the  respirophasic diameter changes were in the normal range (=  50%); findings are consistent with normal central venous  pressure.  Impressions:   - Normal LV size and systolic function, EF 0000000. Moderate  diastolic dysfunction. Mitral valve prolapse with trivial  mitral regurgitation. RV normal size and systolic  function.   Assessment & Plan   1.  Diastolic heart failure-echocardiogram 07/2011 LVEF 55 to 123456 ,grade 2 diastolic dysfunction, and mitral valve prolapse with trivial mitral regurgitation.  No shortness of breath today and continues to advance his physical activity/exercise regimen. Continue heart healthy low-sodium diet Maintain physical activity   2.  Essential hypertension-BP today  128/84.  Well-controlled at home Continue heart healthy low-sodium diet Maintain physical activity  3.  Dyslipidemia-12/25/2018: Cholesterol 189; HDL 47.50; LDL Cholesterol 106; Triglycerides 177.0; VLDL 35.4 . Monitored by PCP Continue pravastatin 2 mg daily  Followed by PCP  Disposition: Follow-up with Dr. Percival Spanish in 1 year.   Jossie Ng. [facility] [place] [address] Office [phone] Fax [phone]

## 2019-09-05 NOTE — Patient Instructions (Addendum)
Medication Instructions:  The current medical regimen is effective;  continue present plan and medications as directed. Please refer to the Current Medication list given to you today. *If you need a refill on your cardiac medications before your next appointment, please call your pharmacy*  Special Instructions  PLEASE READ AND FOLLOW HEART HEALTHY DIET-ATTACHED  Follow-Up: Your next appointment:  12 month(s) Please call our office 2 months in advance to schedule this appointment In Person with You may see Minus Breeding, MD Coletta Memos, FNP-C or one of the following Advanced Practice Providers on your designated Care Team:  Rosaria Ferries, PA-C Jory Sims, DNP, ANP Cadence Kathlen Mody, NP  At [facility], you and your health needs are our priority.  As part of our continuing mission to provide you with exceptional heart care, we have created designated Provider Care Teams.  These Care Teams include your primary Cardiologist (physician) and Advanced Practice Providers (APPs -  Physician Assistants and Nurse Practitioners) who all work together to provide you with the care you need, when you need it.  We recommend signing up for the patient portal called "MyChart".  Sign up information is provided on this After Visit Summary.  MyChart is used to connect with patients for Virtual Visits (Telemedicine).  Patients are able to view lab/test results, encounter notes, upcoming appointments, etc.  Non-urgent messages can be sent to your provider as well.   To learn more about what you can do with MyChart, go to [email or web address].      Fat and Cholesterol Restricted Eating Plan Getting too much fat and cholesterol in your diet may cause health problems. Choosing the right foods helps keep your fat and cholesterol at normal levels. This can keep you from getting certain diseases.  What are tips for following this plan? Meal planning  At meals, divide your plate into four equal parts: ? Fill  one-half of your plate with vegetables and green salads. ? Fill one-fourth of your plate with whole grains. ? Fill one-fourth of your plate with low-fat (lean) protein foods.  Eat fish that is high in omega-3 fats at least two times a week. This includes mackerel, tuna, sardines, and salmon.  Eat foods that are high in fiber, such as whole grains, beans, apples, broccoli, carrots, peas, and barley. General tips   Work with your doctor to lose weight if you need to.  Avoid: ? Foods with added sugar. ? Fried foods. ? Foods with partially hydrogenated oils.  Limit alcohol intake to no more than 1 drink a day for nonpregnant women and 2 drinks a day for men. One drink equals 12 oz of beer, 5 oz of wine, or 1 oz of hard liquor. Reading food labels  Check food labels for: ? Trans fats. ? Partially hydrogenated oils. ? Saturated fat (g) in each serving. ? Cholesterol (mg) in each serving. ? Fiber (g) in each serving.  Choose foods with healthy fats, such as: ? Monounsaturated fats. ? Polyunsaturated fats. ? Omega-3 fats.  Choose grain products that have whole grains. Look for the word "whole" as the first word in the ingredient list. Cooking  Cook foods using low-fat methods. These include baking, boiling, grilling, and broiling.  Eat more home-cooked foods. Eat at restaurants and buffets less often.  Avoid cooking using saturated fats, such as butter, cream, palm oil, palm kernel oil, and coconut oil. Recommended foods  Fruits  All fresh, canned (in natural juice), or frozen fruits. Vegetables  Fresh or frozen vegetables (raw,  steamed, roasted, or grilled). Green salads. Grains  Whole grains, such as whole wheat or whole grain breads, crackers, cereals, and pasta. Unsweetened oatmeal, bulgur, barley, quinoa, or brown rice. Corn or whole wheat flour tortillas. Meats and other protein foods  Ground beef (85% or leaner), grass-fed beef, or beef trimmed of fat. Skinless  chicken or [place]. Ground chicken or [place]. Pork trimmed of fat. All fish and seafood. Egg whites. Dried beans, peas, or lentils. Unsalted nuts or seeds. Unsalted canned beans. Nut butters without added sugar or oil. Dairy  Low-fat or nonfat dairy products, such as skim or 1% milk, 2% or reduced-fat cheeses, low-fat and fat-free ricotta or cottage cheese, or plain low-fat and nonfat yogurt. Fats and oils  Tub margarine without trans fats. Light or reduced-fat mayonnaise and salad dressings. Avocado. Olive, canola, sesame, or safflower oils. The items listed above may not be a complete list of foods and beverages you can eat. Contact a dietitian for more information. Foods to avoid Fruits  Canned fruit in heavy syrup. Fruit in cream or butter sauce. Fried fruit. Vegetables  Vegetables cooked in cheese, cream, or butter sauce. Fried vegetables. Grains  White bread. White pasta. White rice. Cornbread. Bagels, pastries, and croissants. Crackers and snack foods that contain trans fat and hydrogenated oils. Meats and other protein foods  Fatty cuts of meat. Ribs, chicken wings, bacon, sausage, bologna, salami, chitterlings, fatback, hot dogs, bratwurst, and packaged lunch meats. Liver and organ meats. Whole eggs and egg yolks. Chicken and [place] with skin. Fried meat. Dairy  Whole or 2% milk, cream, half-and-half, and cream cheese. Whole milk cheeses. Whole-fat or sweetened yogurt. Full-fat cheeses. Nondairy creamers and whipped toppings. Processed cheese, cheese spreads, and cheese curds. Beverages  Alcohol. Sugar-sweetened drinks such as sodas, lemonade, and fruit drinks. Fats and oils  Butter, stick margarine, lard, shortening, ghee, or bacon fat. Coconut, palm kernel, and palm oils. Sweets and desserts  Corn syrup, sugars, honey, and molasses. Candy. Jam and jelly. Syrup. Sweetened cereals. Cookies, pies, cakes, donuts, muffins, and ice cream. The items listed above may not be a  complete list of foods and beverages you should avoid. Contact a dietitian for more information. Summary  Choosing the right foods helps keep your fat and cholesterol at normal levels. This can keep you from getting certain diseases.  At meals, fill one-half of your plate with vegetables and green salads.  Eat high-fiber foods, like whole grains, beans, apples, carrots, peas, and barley.  Limit added sugar, saturated fats, alcohol, and fried foods. This information is not intended to replace advice given to you by your health care provider. Make sure you discuss any questions you have with your health care provider.

## 2019-09-09 ENCOUNTER — Telehealth: Payer: Self-pay | Admitting: Gastroenterology

## 2019-09-09 ENCOUNTER — Other Ambulatory Visit: Payer: Self-pay | Admitting: Internal Medicine

## 2019-09-09 DIAGNOSIS — Z96642 Presence of left artificial hip joint: Secondary | ICD-10-CM | POA: Diagnosis not present

## 2019-09-09 DIAGNOSIS — Z471 Aftercare following joint replacement surgery: Secondary | ICD-10-CM | POA: Diagnosis not present

## 2019-09-09 DIAGNOSIS — Z96643 Presence of artificial hip joint, bilateral: Secondary | ICD-10-CM | POA: Diagnosis not present

## 2019-09-09 DIAGNOSIS — Z96641 Presence of right artificial hip joint: Secondary | ICD-10-CM | POA: Diagnosis not present

## 2019-09-09 NOTE — Telephone Encounter (Signed)
Pt inquired about results of colonoscopy.  

## 2019-09-10 NOTE — Telephone Encounter (Signed)
Pt called regarding this msg.

## 2019-09-10 NOTE — Telephone Encounter (Signed)
Advised the patient the results have not been reviewed by the provider.

## 2019-09-13 ENCOUNTER — Encounter: Payer: Self-pay | Admitting: Gastroenterology

## 2019-09-16 ENCOUNTER — Telehealth: Payer: Self-pay

## 2019-09-16 NOTE — Telephone Encounter (Signed)
1.Medication Requested:HYDROcodone-acetaminophen (NORCO) 10-325 MG tablet  2. Pharmacy (Name, [place], City):[id number] [place] [place]  3. On Med List: Yes   4. Last Visit with PCP: 3.22.2021   5. Next visit date with PCP: no appt is made at this time     Agent: Please be advised that RX refills may take up to 3 business days. We ask that you follow-up with your pharmacy.

## 2019-09-16 NOTE — Telephone Encounter (Signed)
Check [place] registry last filled 08/19/2019.Marland KitchenJohny Chess

## 2019-09-17 ENCOUNTER — Other Ambulatory Visit: Payer: Self-pay | Admitting: Internal Medicine

## 2019-09-17 DIAGNOSIS — M48061 Spinal stenosis, lumbar region without neurogenic claudication: Secondary | ICD-10-CM

## 2019-09-17 DIAGNOSIS — M87051 Idiopathic aseptic necrosis of right femur: Secondary | ICD-10-CM

## 2019-09-17 MED ORDER — HYDROCODONE-ACETAMINOPHEN 10-325 MG PO TABS: 1.0000 | ORAL_TABLET | Freq: Four times a day (QID) | ORAL | 0 refills | Status: DC | PRN

## 2019-09-18 ENCOUNTER — Other Ambulatory Visit: Payer: Self-pay | Admitting: Internal Medicine

## 2019-10-15 ENCOUNTER — Telehealth: Payer: Self-pay | Admitting: Internal Medicine

## 2019-10-15 NOTE — Telephone Encounter (Signed)
Hydrocodone last filled on 09/17/2019. Last visit with PCP was 08/19/2019. No upcoming appointment scheduled.

## 2019-10-15 NOTE — Telephone Encounter (Signed)
    1.Medication Requested: HYDROcodone-acetaminophen (NORCO) 10-325 MG tablet EDARBYCLOR 40-12.5 MG TABS  2. Pharmacy (Name, Street, [place]): [id number] [place], [place] [address]  3. On Med List: YES  4. Last Visit with PCP: 08/19/2019  5. Next visit date with PCP:   Agent: Please be advised that RX refills may take up to 3 business days. We ask that you follow-up with your pharmacy.

## 2019-10-18 ENCOUNTER — Other Ambulatory Visit: Payer: Self-pay | Admitting: Internal Medicine

## 2019-10-18 ENCOUNTER — Telehealth: Payer: Self-pay | Admitting: Internal Medicine

## 2019-10-18 DIAGNOSIS — E785 Hyperlipidemia, unspecified: Secondary | ICD-10-CM

## 2019-10-18 DIAGNOSIS — M48061 Spinal stenosis, lumbar region without neurogenic claudication: Secondary | ICD-10-CM

## 2019-10-18 DIAGNOSIS — M87051 Idiopathic aseptic necrosis of right femur: Secondary | ICD-10-CM

## 2019-10-18 MED ORDER — HYDROCODONE-ACETAMINOPHEN 10-325 MG PO TABS: 1.0000 | ORAL_TABLET | Freq: Four times a day (QID) | ORAL | 0 refills | Status: DC | PRN

## 2019-10-18 NOTE — Progress Notes (Signed)
  Chronic Care Management   Note  10/18/2019 Name: Jonathan [name] MRN: [id number] DOB: Jul 21, 1955  Jonathan [name] is a 64 y.o. year old male who is a primary care patient of Janith Lima, MD. I reached out to Jonathan [name] by phone today in response to a referral sent by Mr. Jonathan [name]'s PCP, Janith Lima, MD.   [name] was given information about Chronic Care Management services today including:  1. CCM service includes personalized support from designated clinical staff supervised by his physician, including individualized plan of care and coordination with other care providers 2. 24/7 contact phone numbers for assistance for urgent and routine care needs. 3. Service will only be billed when office clinical staff spend 20 minutes or more in a month to coordinate care. 4. Only one practitioner may furnish and bill the service in a calendar month. 5. The patient may stop CCM services at any time (effective at the end of the month) by phone call to the office staff.   Patient agreed to services and verbal consent obtained.   This note is not being shared with the patient for the following reason: To respect privacy (The patient or proxy has requested that the information not be shared).  Follow up plan:   Earney [place] [facility] Scheduler

## 2019-10-22 ENCOUNTER — Telehealth: Payer: Self-pay | Admitting: Internal Medicine

## 2019-10-22 NOTE — Telephone Encounter (Signed)
New message:   Pt c/o medication issue:  1. Name of Medication: EDARBYCLOR 40-12.5 MG TABS   4. What is your medication issue? Pt states his having a lot of dizziness and would like to go back to the 5mg  medication. Pt would like to go back to the amLODipine (NORVASC) 5 MG tablet   [id number] [place] [place] - [address]

## 2019-10-22 NOTE — Telephone Encounter (Signed)
Spoke to pt and he stated that the Edarbyclor 40-12.5 mg is causing dizziness.   Pt was not able to tell me the medications that he was taking when I was trying to confirm. Pt is calling me back.

## 2019-10-22 NOTE — Telephone Encounter (Signed)
Per PCP - please have pt schedule a BP follow up with PCP and to bring all of his medications.

## 2019-10-24 ENCOUNTER — Ambulatory Visit (INDEPENDENT_AMBULATORY_CARE_PROVIDER_SITE_OTHER): Payer: Medicare Other | Admitting: Internal Medicine

## 2019-10-24 ENCOUNTER — Encounter: Payer: Self-pay | Admitting: Internal Medicine

## 2019-10-24 VITALS — BP 122/82 | HR 49 | Temp 98.1°F | Resp 16 | Ht 74.5 in | Wt 197.5 lb

## 2019-10-24 DIAGNOSIS — R001 Bradycardia, unspecified: Secondary | ICD-10-CM

## 2019-10-24 DIAGNOSIS — I10 Essential (primary) hypertension: Secondary | ICD-10-CM | POA: Diagnosis not present

## 2019-10-24 DIAGNOSIS — I5189 Other ill-defined heart diseases: Secondary | ICD-10-CM

## 2019-10-24 DIAGNOSIS — I519 Heart disease, unspecified: Secondary | ICD-10-CM | POA: Diagnosis not present

## 2019-10-24 MED ORDER — INDAPAMIDE 1.25 MG PO TABS: 1.2500 mg | ORAL_TABLET | Freq: Every day | ORAL | 0 refills | Status: DC

## 2019-10-24 NOTE — Patient Instructions (Signed)
Bradycardia, Adult Bradycardia is a slower-than-normal heartbeat. A normal resting heart rate for an adult ranges from 60 to 100 beats per minute. With bradycardia, the resting heart rate is less than 60 beats per minute. Bradycardia can prevent enough oxygen from reaching certain areas of your body when you are active. It can be serious if it keeps enough oxygen from reaching your brain and other parts of your body. Bradycardia is not a problem for everyone. For some healthy adults, a slow resting heart rate is normal. What are the causes? This condition may be caused by:  A problem with the heart, including: ? A problem with the heart's electrical system, such as a heart block. With a heart block, electrical signals between the chambers of the heart are partially or completely blocked, so they are not able to work as they should. ? A problem with the heart's natural pacemaker (sinus node). ? Heart disease. ? A heart attack. ? Heart damage. ? Lyme disease. ? A heart infection. ? A heart condition that is present at birth (congenital heart defect).  Certain medicines that treat heart conditions.  Certain conditions, such as hypothyroidism and obstructive sleep apnea.  Problems with the balance of chemicals and other substances, like potassium, in the blood.  Trauma.  Radiation therapy. What increases the risk? You are more likely to develop this condition if you:  Are age 65 or older.  Have high blood pressure (hypertension), high cholesterol (hyperlipidemia), or diabetes.  Drink heavily, use tobacco or nicotine products, or use drugs. What are the signs or symptoms? Symptoms of this condition include:  Light-headedness.  Feeling faint or fainting.  Fatigue and weakness.  Trouble with activity or exercise.  Shortness of breath.  Chest pain (angina).  Drowsiness.  Confusion.  Dizziness. How is this diagnosed? This condition may be diagnosed based on:  Your  symptoms.  Your medical history.  A physical exam. During the exam, your health care provider will listen to your heartbeat and check your pulse. To confirm the diagnosis, your health care provider may order tests, such as:  Blood tests.  An electrocardiogram (ECG). This test records the heart's electrical activity. The test can show how fast your heart is beating and whether the heartbeat is steady.  A test in which you wear a portable device (event recorder or Holter monitor) to record your heart's electrical activity while you go about your day.  Anexercise test. How is this treated? Treatment for this condition depends on the cause of the condition and how severe your symptoms are. Treatment may involve:  Treatment of the underlying condition.  Changing your medicines or how much medicine you take.  Having a small, battery-operated device called a pacemaker implanted under the skin. When bradycardia occurs, this device can be used to increase your heart rate and help your heart beat in a regular rhythm. Follow these instructions at home: Lifestyle   Manage any health conditions that contribute to bradycardia as told by your health care provider.  Follow a heart-healthy diet. A nutrition specialist (dietitian) can help educate you about healthy food options and changes.  Follow an exercise program that is approved by your health care provider.  Maintain a healthy weight.  Try to reduce or manage your stress, such as with yoga or meditation. If you need help reducing stress, ask your health care provider.  Do not use any products that contain nicotine or tobacco, such as cigarettes, e-cigarettes, and chewing tobacco. If you need help   quitting, ask your health care provider.  Do not use illegal drugs.  Limit alcohol intake to no more than 1 drink a day for nonpregnant women and 2 drinks a day for men. Be aware of how much alcohol is in your drink. In the [place] one drink  equals one 12 oz bottle of beer (355 mL), one 5 oz glass of wine (148 mL), or one 1 oz glass of hard liquor (44 mL). General instructions  Take over-the-counter and prescription medicines only as told by your health care provider.  Keep all follow-up visits as told by your health care provider. This is important. How is this prevented? In some cases, bradycardia may be prevented by:  Treating underlying medical problems.  Stopping behaviors or medicines that can trigger the condition. Contact a health care provider if you:  Feel light-headed or dizzy.  Almost faint.  Feel weak or are easily fatigued during physical activity.  Experience confusion or have memory problems. Get help right away if:  You faint.  You have: ? An irregular heartbeat (palpitations). ? Chest pain. ? Trouble breathing. Summary  Bradycardia is a slower-than-normal heartbeat. With bradycardia, the resting heart rate is less than 60 beats per minute.  Treatment for this condition depends on the cause.  Manage any health conditions that contribute to bradycardia as told by your health care provider.  Do not use any products that contain nicotine or tobacco, such as cigarettes, e-cigarettes, and chewing tobacco, and limit alcohol intake.  Keep all follow-up visits as told by your health care provider. This is important. This information is not intended to replace advice given to you by your health care provider. Make sure you discuss any questions you have with your health care provider. Document Revised: 11/27/2017 Document Reviewed: 10/25/2017 Elsevier Patient Education  2020 Elsevier Inc.  

## 2019-10-24 NOTE — Progress Notes (Signed)
Subjective:  Patient ID: Jonathan [name], male    DOB: [birth year]  Age: 64 y.o. MRN: [id number]  CC: Hypertension  This visit occurred during the SARS-CoV-2 public health emergency.  Safety protocols were in place, including screening questions prior to the visit, additional usage of staff PPE, and extensive cleaning of exam room while observing appropriate contact time as indicated for disinfecting solutions.    HPI Jonathan [name] presents for f/up - He complains of an episode of dizziness about 3 days ago.  He said he had been out in the hot sun weed eating for about 8 hours and felt like he got dehydrated.  He was never presyncopal and did not experience syncope.  He says that a nurse friend of his checked his vitals and she thought that they were okay.  He denies palpitations, diaphoresis, chest pain, dyspnea on exertion, edema, or fatigue.  He is confused about his antihypertensives but it sounds like he is taking Lexicographer.  Outpatient Medications Prior to Visit  Medication Sig Dispense Refill  . Azelastine-Fluticasone (DYMISTA) 137-50 MCG/ACT SUSP Place 2 Act into the nose 2 (two) times daily. 69 g 1  . DEXILANT 60 MG capsule TAKE 1 CAPSULE BY MOUTH EVERY DAY 90 capsule 1  . fexofenadine (ALLEGRA) 180 MG tablet Take 1 tablet (180 mg total) by mouth daily. 90 tablet 1  . HYDROcodone-acetaminophen (NORCO) 10-325 MG tablet Take 1 tablet by mouth every 6 (six) hours as needed for moderate pain. 90 tablet 0  . Pitavastatin Calcium 2 MG TABS Take 1 tablet (2 mg total) by mouth daily. 90 tablet 1  . amLODipine (NORVASC) 5 MG tablet TAKE 1 TABLET BY MOUTH EVERY DAY 90 tablet 1  . Betamethasone Valerate 0.12 % foam APPLY TOPICALLY TO AFFECTED AREA(S) TWICE DAILY 100 g 2  . dutasteride (AVODART) 0.5 MG capsule TAKE 1 CAPSULE (0.5 MG TOTAL) BY MOUTH DAILY. 90 capsule 1  . EDARBYCLOR 40-12.5 MG TABS TAKE 1 TABLET BY MOUTH DAILY 30 tablet 5  . indapamide (LOZOL) 1.25 MG tablet TAKE 1  TABLET EVERY DAY 90 tablet 1   No facility-administered medications prior to visit.    ROS Review of Systems  Constitutional: Negative for appetite change, diaphoresis, fatigue and unexpected weight change.  HENT: Negative.   Eyes: Negative for visual disturbance.  Respiratory: Negative for cough, chest tightness, shortness of breath and wheezing.   Cardiovascular: Negative for chest pain, palpitations and leg swelling.  Gastrointestinal: Negative for abdominal pain, diarrhea, nausea and vomiting.  Endocrine: Negative.  Negative for polydipsia, polyphagia and polyuria.  Genitourinary: Negative.  Negative for difficulty urinating.  Musculoskeletal: Positive for arthralgias and back pain. Negative for myalgias.  Skin: Negative.  Negative for color change.  Neurological: Positive for dizziness. Negative for weakness and light-headedness.  Hematological: Negative for adenopathy. Does not bruise/bleed easily.  Psychiatric/Behavioral: Negative.     Objective:  BP 122/82 (BP Location: Left Arm, Patient Position: Sitting, Cuff Size: Large)   Pulse (!) 49   Temp 98.1 F (36.7 C) (Oral)   Resp 16   Ht 6' 2.5" (1.892 m)   Wt 197 lb 8 oz (89.6 kg)   SpO2 99%   BMI 25.02 kg/m   BP Readings from Last 3 Encounters:  10/24/19 122/82  09/05/19 128/84  09/02/19 (!) 87/43    Wt Readings from Last 3 Encounters:  10/24/19 197 lb 8 oz (89.6 kg)  09/05/19 205 lb 9.6 oz (93.3 kg)  09/02/19 203 lb (  92.1 kg)    Physical Exam Vitals reviewed.  Constitutional:      Appearance: Normal appearance.  HENT:     Nose: Nose normal.     Mouth/Throat:     Mouth: Mucous membranes are moist.  Eyes:     General: No scleral icterus.    Conjunctiva/sclera: Conjunctivae normal.  Cardiovascular:     Rate and Rhythm: Regular rhythm. Bradycardia present.     Pulses: Normal pulses.     Heart sounds: No murmur.     Comments: EKG - Sinus bradycardia, 47 bpm Minimal LVH Otherwise normal EKG Pulmonary:      Effort: Pulmonary effort is normal.     Breath sounds: No stridor. No wheezing, rhonchi or rales.  Abdominal:     General: Abdomen is flat. Bowel sounds are normal.     Palpations: There is no hepatomegaly or splenomegaly.     Tenderness: There is no abdominal tenderness.  Musculoskeletal:     Cervical back: Neck supple.     Right lower leg: No edema.     Left lower leg: No edema.  Skin:    General: Skin is warm and dry.     Coloration: Skin is not pale.  Neurological:     General: No focal deficit present.     Mental Status: He is alert and oriented to person, place, and time. Mental status is at baseline.  Psychiatric:        Attention and Perception: He is attentive.        Speech: Speech is tangential. Speech is not delayed.        Behavior: Behavior normal.        Thought Content: Thought content normal.        Cognition and Memory: Cognition normal.        Judgment: Judgment normal.     Lab Results  Component Value Date   WBC 5.6 08/15/2019   HGB 15.5 08/15/2019   HCT 45.6 08/15/2019   PLT 252.0 08/15/2019   GLUCOSE 116 (H) 08/15/2019   CHOL 189 12/25/2018   TRIG 177.0 (H) 12/25/2018   HDL 47.50 12/25/2018   LDLDIRECT 131.6 07/14/2011   LDLCALC 106 (H) 12/25/2018   ALT 43 08/15/2019   AST 23 08/15/2019   NA 135 08/15/2019   K 4.3 08/15/2019   CL 101 08/15/2019   CREATININE 1.04 08/15/2019   BUN 16 08/15/2019   CO2 27 08/15/2019   TSH 1.09 12/25/2018   PSA 2.3 04/24/2019   INR 0.97 09/08/2014   HGBA1C 5.2 10/13/2014    CT Head Wo Contrast  Result Date: 05/15/2018 CLINICAL DATA:  64 year old male weak and tired for 2 days. Initial encounter. EXAM: CT HEAD WITHOUT CONTRAST TECHNIQUE: Contiguous axial images were obtained from the base of the skull through the vertex without intravenous contrast. COMPARISON:  12/28/2006 head CT. FINDINGS: Brain: No intracranial hemorrhage or CT evidence of large acute infarct. Chronic microvascular changes. Anterior right  frontal calvarial 1.2 cm area of bony overgrowth has changed minimally from the prior examination when this measured up to 1 cm. Minimal impression upon anteromedial right frontal lobe without surrounding vasogenic edema. Question exostosis versus en plaque meningioma. Vascular: No acute hyperdense vessel. Skull: No acute abnormality. Sinuses/Orbits: Remote right zygomatic arch, right orbital floor, right lateral orbital wall and right maxillary fracture with plate and screws right lateral supraorbital region. Visualized paranasal sinuses are clear. Other: Mastoid air cells and middle ear cavities are clear. IMPRESSION: 1. No intracranial hemorrhage  or CT evidence of large acute infarct. 2. Chronic microvascular changes. 3. Anterior right frontal calvarial 1.2 cm area of bony overgrowth has changed minimally from the prior examination when this measured up to 1 cm. Minimal impression upon anteromedial right frontal lobe without surrounding vasogenic edema. Question exostosis versus en plaque meningioma. 4. Remote right zygomatic arch, right orbital floor, right lateral orbital wall and right maxillary fractures. Plate and screws right lateral supraorbital region. Electronically Signed   By: Genia Del M.D.   On: 05/15/2018 09:03    Assessment & Plan:   Jonathan [name] was seen today for hypertension.  Diagnoses and all orders for this visit:  Bradycardia- His heart rate is down to 47 on today's EKG.  I am concerned that bradycardia may be contributing to his symptoms so I have asked him to undergo a cardiac event monitor. -     EKG 12-Lead -     Cancel: CARDIAC EVENT MONITOR; Future -     CARDIAC EVENT MONITOR; Future  Essential hypertension- His systolic blood pressure is 122.  I recommended he consolidate his antihypertensives into a single agent with indapamide.  I will monitor his electrolytes and renal function. -     indapamide (LOZOL) 1.25 MG tablet; Take 1 tablet (1.25 mg total) by mouth  daily.  Grade II diastolic dysfunction- His blood pressure is well controlled and his volume status is normal. -     indapamide (LOZOL) 1.25 MG tablet; Take 1 tablet (1.25 mg total) by mouth daily.   I have discontinued Jonathan [name]'s Betamethasone Valerate, amLODipine, dutasteride, and Edarbyclor. I have also changed his indapamide. Additionally, I am having him maintain his Pitavastatin Calcium, Azelastine-Fluticasone, fexofenadine, Dexilant, and HYDROcodone-acetaminophen.  Meds ordered this encounter  Medications  . indapamide (LOZOL) 1.25 MG tablet    Sig: Take 1 tablet (1.25 mg total) by mouth daily.    Dispense:  90 tablet    Refill:  0     Follow-up: Return in about 4 weeks (around 11/21/2019).  Jonathan Calico, MD

## 2019-11-03 DIAGNOSIS — Z7189 Other specified counseling: Secondary | ICD-10-CM | POA: Insufficient documentation

## 2019-11-03 DIAGNOSIS — I5032 Chronic diastolic (congestive) heart failure: Secondary | ICD-10-CM | POA: Insufficient documentation

## 2019-11-03 NOTE — Progress Notes (Signed)
Cardiology Office Note   Date:  11/04/2019   ID:  Jonathan [name], DOB 18-May-1956, MRN [id number]  PCP:  Janith Lima, MD  Cardiologist:   Minus Breeding, MD Referring:  Janith Lima, MD  Chief Complaint  Patient presents with  . Bradycardia      History of Present Illness: Jonathan [name] is a 64 y.o. male who I have not seen since 2013. He was seen by Odie Sera NP earlier this year for evaluation of chronic diastolic HF.    He also has bradycardia.  He has had some chronic diastolic dysfunction.  He was given Lozol recently for blood pressure but he has not been taking it because his blood pressures been well controlled.  He says he feels great.  He denies any cardiovascular symptoms.  He is very physically active. The patient denies any new symptoms such as chest discomfort, neck or arm discomfort. There has been no new shortness of breath, PND or orthopnea. There have been no reported palpitations, presyncope or syncope.     Past Medical History:  Diagnosis Date  . Anxiety   . Arthritis   . Avascular necrosis of bone (HCC)    left hip   . Back pain, lumbosacral 2000  . Blind right eye   . Colon polyps 08/29/2011   Hyperplastic colon polyps  . Mitral valve prolapse     Past Surgical History:  Procedure Laterality Date  . EYE SURGERY  2007   Fractured bones repaired/blind in right eye  . HERNIA REPAIR     Right inguinal  . SPINE SURGERY    . TOTAL HIP ARTHROPLASTY Right 09/18/2014   Procedure: RIGHT TOTAL HIP ARTHROPLASTY ANTERIOR APPROACH;  Surgeon: Rod Can, MD;  Location: [facility];  Service: Orthopedics;  Laterality: Right;  . TOTAL HIP ARTHROPLASTY Left 04/06/2017   Procedure: LEFT TOTAL HIP ARTHROPLASTY ANTERIOR APPROACH;  Surgeon: Rod Can, MD;  Location: [facility];  Service: Orthopedics;  Laterality: Left;  Needs RNFA     No current outpatient medications on file.   No current facility-administered medications for this visit.     Allergies:   Cortisone and Lipitor [atorvastatin calcium]    ROS:  Please see the history of present illness.   Otherwise, review of systems are positive for none.   All other systems are reviewed and negative.    PHYSICAL EXAM: VS:  BP 130/80   Pulse (!) 46   Temp (!) 97.3 F (36.3 C)   Ht 6\' 2"  (1.88 m)   Wt 200 lb (90.7 kg)   SpO2 96%   BMI 25.68 kg/m  , BMI Body mass index is 25.68 kg/m. GENERAL:  Well appearing NECK:  No jugular venous distention, waveform within normal limits, carotid upstroke brisk and symmetric, no bruits, no thyromegaly LUNGS:  Clear to auscultation bilaterally CHEST:  Unremarkable HEART:  PMI not displaced or sustained,S1 and S2 within normal limits, no S3, no S4, no clicks, no rubs, 2/6 late systolic murmurs, no diastolic ABD:  Flat, positive bowel sounds normal in frequency in pitch, no bruits, no rebound, no guarding, no midline pulsatile mass, no hepatomegaly, no splenomegaly EXT:  2 plus pulses throughout, no edema, no edema  EKG:  EKG is ordered today. The ekg ordered today demonstrates Sinus bradycardia, rate 46, axis within normal limits, intervals within normal limits, no acute ST-T wave changes.   Recent Labs: 12/25/2018: TSH 1.09 08/15/2019: ALT 43; BUN 16; Creatinine, Ser 1.04;  Hemoglobin 15.5; Platelets 252.0; Potassium 4.3; Sodium 135    Lipid Panel    Component Value Date/Time   CHOL 189 12/25/2018 1109   TRIG 177.0 (H) 12/25/2018 1109   HDL 47.50 12/25/2018 1109   CHOLHDL 4 12/25/2018 1109   VLDL 35.4 12/25/2018 1109   LDLCALC 106 (H) 12/25/2018 1109   LDLDIRECT 131.6 07/14/2011 0940      Wt Readings from Last 3 Encounters:  11/04/19 200 lb (90.7 kg)  10/24/19 197 lb 8 oz (89.6 kg)  09/05/19 205 lb 9.6 oz (93.3 kg)      Other studies Reviewed: Additional studies/ records that were reviewed today include: None. Review of the above records demonstrates:  Please see elsewhere in the note.     ASSESSMENT AND  PLAN:  Diastolic heart failure :    He is euvolemic.  We talked about salt fluid restriction.  No change in therapy.  Bradycardia: He is not having any symptoms related to this.  No change in therapy.  Essential hypertension: His blood pressure is well controlled.  He does not want to take any medications and I do not think he absolutely has to at this point.   Murmur: I suspect he has mitral valve prolapse with some regurgitation.  He will have an echocardiogram.  This will also assess his diastolic function.    Current medicines are reviewed at length with the patient today.  The patient does not have concerns regarding medicines.  The following changes have been made:  no change  Labs/ tests ordered today include:   Orders Placed This Encounter  Procedures  . EKG 12-Lead  . ECHOCARDIOGRAM COMPLETE     Disposition:   FU with me in 1 year   Signed, Minus Breeding, MD  11/04/2019 11:19 AM    [place]

## 2019-11-04 ENCOUNTER — Ambulatory Visit (INDEPENDENT_AMBULATORY_CARE_PROVIDER_SITE_OTHER): Payer: Medicare Other | Admitting: Cardiology

## 2019-11-04 ENCOUNTER — Other Ambulatory Visit: Payer: Self-pay

## 2019-11-04 ENCOUNTER — Encounter: Payer: Self-pay | Admitting: Cardiology

## 2019-11-04 VITALS — BP 130/80 | HR 46 | Temp 97.3°F | Ht 74.0 in | Wt 200.0 lb

## 2019-11-04 DIAGNOSIS — M48061 Spinal stenosis, lumbar region without neurogenic claudication: Secondary | ICD-10-CM

## 2019-11-04 DIAGNOSIS — M87051 Idiopathic aseptic necrosis of right femur: Secondary | ICD-10-CM

## 2019-11-04 DIAGNOSIS — I5032 Chronic diastolic (congestive) heart failure: Secondary | ICD-10-CM

## 2019-11-04 DIAGNOSIS — E785 Hyperlipidemia, unspecified: Secondary | ICD-10-CM

## 2019-11-04 DIAGNOSIS — M87052 Idiopathic aseptic necrosis of left femur: Secondary | ICD-10-CM

## 2019-11-04 DIAGNOSIS — I341 Nonrheumatic mitral (valve) prolapse: Secondary | ICD-10-CM | POA: Diagnosis not present

## 2019-11-04 DIAGNOSIS — I10 Essential (primary) hypertension: Secondary | ICD-10-CM | POA: Diagnosis not present

## 2019-11-04 NOTE — Patient Instructions (Signed)
Medication Instructions:  DISCONTINUE LOZOL *If you need a refill on your cardiac medications before your next appointment, please call your pharmacy*  Lab Work: NONE ORDERED THIS VISIT  Testing/Procedures: Your physician has requested that you have an echocardiogram. Echocardiography is a painless test that uses sound waves to create images of your heart. It provides your doctor with information about the size and shape of your heart and how well your heart's chambers and valves are working. This procedure takes approximately one hour. There are no restrictions for this procedure. [place]  Follow-Up: At [facility], you and your health needs are our priority.  As part of our continuing mission to provide you with exceptional heart care, we have created designated Provider Care Teams.  These Care Teams include your primary Cardiologist (physician) and Advanced Practice Providers (APPs -  Physician Assistants and Nurse Practitioners) who all work together to provide you with the care you need, when you need it.  Your next appointment:   12 month(s)  You will receive a reminder letter in the mail two months in advance. If you don't receive a letter, please call our office to schedule the follow-up appointment.  The format for your next appointment:   In Person  Provider:   Minus Breeding, MD

## 2019-11-06 ENCOUNTER — Telehealth: Payer: Self-pay | Admitting: Gastroenterology

## 2019-11-06 NOTE — Telephone Encounter (Signed)
Pt's wife called and reported that pt is complaining about GERD.  She stated that his cardiologist advised to hold GERD medication.  Please advise.

## 2019-11-06 NOTE — Telephone Encounter (Signed)
Wife states pt was told to stop the dexilant by cardiology, this is not in the note from cardiology. Pt and wife not clear on what/why they were told to stop, think it may have been overlooked per the wife. Pt had GERD last night and took the [place]. Discussed with her she should call the cardiology office back to clarify. Pt plans to keep his OV as scheduled in July with our office.

## 2019-11-10 ENCOUNTER — Other Ambulatory Visit: Payer: Self-pay | Admitting: Internal Medicine

## 2019-11-10 DIAGNOSIS — N138 Other obstructive and reflux uropathy: Secondary | ICD-10-CM

## 2019-11-12 ENCOUNTER — Telehealth: Payer: Self-pay

## 2019-11-12 NOTE — Telephone Encounter (Signed)
1.Medication Requested: Hydrocodone  10-325 mg   2. Pharmacy (Name, [place], City):[id number] [place], [place]  3. On Med List: no   4. Last Visit with PCP: 5.27.21  5. Next visit date with PCP: n/a   Agent: Please be advised that RX refills may take up to 3 business days. We ask that you follow-up with your pharmacy.

## 2019-11-12 NOTE — Telephone Encounter (Signed)
Per database hydrocodone was refilled on 10/18/2019.   Last visit with PCP was 10/24/2019.

## 2019-11-17 ENCOUNTER — Other Ambulatory Visit: Payer: Self-pay | Admitting: Internal Medicine

## 2019-11-17 DIAGNOSIS — M87052 Idiopathic aseptic necrosis of left femur: Secondary | ICD-10-CM

## 2019-11-17 DIAGNOSIS — M87051 Idiopathic aseptic necrosis of right femur: Secondary | ICD-10-CM

## 2019-11-17 DIAGNOSIS — M48061 Spinal stenosis, lumbar region without neurogenic claudication: Secondary | ICD-10-CM

## 2019-11-17 MED ORDER — HYDROCODONE-ACETAMINOPHEN 10-325 MG PO TABS: 1.0000 | ORAL_TABLET | Freq: Four times a day (QID) | ORAL | 0 refills | Status: DC | PRN

## 2019-11-22 ENCOUNTER — Ambulatory Visit (HOSPITAL_COMMUNITY): Payer: Medicare Other | Attending: Cardiology

## 2019-11-22 ENCOUNTER — Other Ambulatory Visit: Payer: Self-pay

## 2019-11-22 DIAGNOSIS — I341 Nonrheumatic mitral (valve) prolapse: Secondary | ICD-10-CM | POA: Diagnosis not present

## 2019-11-22 DIAGNOSIS — I5032 Chronic diastolic (congestive) heart failure: Secondary | ICD-10-CM | POA: Diagnosis not present

## 2019-11-27 ENCOUNTER — Telehealth: Payer: Self-pay | Admitting: Cardiology

## 2019-11-27 DIAGNOSIS — R3912 Poor urinary stream: Secondary | ICD-10-CM | POA: Diagnosis not present

## 2019-11-27 NOTE — Telephone Encounter (Signed)
Pt calling to go over ECHO results. Nurse advised that report has not yet been reviewed by MD but a nurse will contact him once report is complete. Pt verbalized understanding.

## 2019-11-27 NOTE — Telephone Encounter (Signed)
Pt would like someone to call and go over his Echo results with him.

## 2019-11-29 ENCOUNTER — Ambulatory Visit: Payer: Medicare Other | Admitting: Nurse Practitioner

## 2019-12-14 ENCOUNTER — Other Ambulatory Visit: Payer: Self-pay | Admitting: General Practice

## 2019-12-16 ENCOUNTER — Telehealth: Payer: Self-pay | Admitting: Internal Medicine

## 2019-12-16 NOTE — Telephone Encounter (Signed)
   1.Medication Requested:HYDROcodone-acetaminophen (NORCO) 10-325 MG tablet  2. Pharmacy (Name, Street, City):[id number] [place], [place].  3. On Med List: yes  4. Last Visit with PCP: 10/24/19  5. Next visit date with PCP:   Agent: Please be advised that RX refills may take up to 3 business days. We ask that you follow-up with your pharmacy.

## 2019-12-18 ENCOUNTER — Other Ambulatory Visit: Payer: Self-pay | Admitting: Internal Medicine

## 2019-12-18 DIAGNOSIS — M87051 Idiopathic aseptic necrosis of right femur: Secondary | ICD-10-CM

## 2019-12-18 DIAGNOSIS — M48061 Spinal stenosis, lumbar region without neurogenic claudication: Secondary | ICD-10-CM

## 2019-12-18 MED ORDER — HYDROCODONE-ACETAMINOPHEN 10-325 MG PO TABS: 1.0000 | ORAL_TABLET | Freq: Four times a day (QID) | ORAL | 0 refills | Status: DC | PRN

## 2019-12-25 ENCOUNTER — Other Ambulatory Visit: Payer: Self-pay

## 2019-12-25 ENCOUNTER — Ambulatory Visit: Payer: Medicare Other | Admitting: Pharmacist

## 2019-12-25 DIAGNOSIS — I10 Essential (primary) hypertension: Secondary | ICD-10-CM

## 2019-12-25 DIAGNOSIS — E785 Hyperlipidemia, unspecified: Secondary | ICD-10-CM

## 2019-12-25 DIAGNOSIS — K2100 Gastro-esophageal reflux disease with esophagitis, without bleeding: Secondary | ICD-10-CM

## 2019-12-25 DIAGNOSIS — I5189 Other ill-defined heart diseases: Secondary | ICD-10-CM

## 2019-12-25 NOTE — Patient Instructions (Addendum)
Visit Information  Phone number for Pharmacist: [phone]  Thank you for meeting with me to discuss your medications! I look forward to working with you to achieve your health care goals. Below is a summary of what we talked about during the visit:  Goals Addressed            This Visit's Progress   . Pharmacy Care Plan       CARE PLAN ENTRY (see longitudinal plan of care for additional care plan information)  Current Barriers:  . Chronic Disease Management support, education, and care coordination needs related to Hypertension, Hyperlipidemia, Heart Failure, and GERD   Hypertension / Heart failure BP Readings from Last 3 Encounters:  11/04/19 130/80  10/24/19 122/82  09/05/19 128/84 .  Pharmacist Clinical Goal(s): o Over the next 180 days, patient will work with PharmD and providers to maintain BP goal <130/80 . Current regimen:  o No medications . Interventions: o Discussed BP goals and benefits of diet/exercise for prevention of heart attack / stroke . Patient self care activities - Over the next 180 days, patient will: o Check BP as needed, document, and provide at future appointments o Ensure daily salt intake < 2300 mg/day  Hyperlipidemia Lab Results  Component Value Date/Time   LDLCALC 106 (H) 12/25/2018 11:09 AM   LDLDIRECT 131.6 07/14/2011 09:40 AM .  Pharmacist Clinical Goal(s): o Over the next 180 days, patient will work with PharmD and providers to achieve LDL goal < 100 . Current regimen:  o No medications . Interventions: o Discussed cholesterol goals and benefits of diet/exercise for prevention of heart attack / stroke . Patient self care activities - Over the next 180 days, patient will: o Limit cholesterol in diet (see attached)  GERD . Pharmacist Clinical Goal(s) o Over the next 180 days, patient will work with PharmD and providers to optimize therapy . Current regimen:  o Dexilant 60 mg daily . Interventions: o Discussed daily use of  Dexilant and optimal timing before a meal . Patient self care activities - Over the next 180 days, patient will: o Continue medication as prescribed o Avoid foods that trigger reflux/heartburn  Medication management . Pharmacist Clinical Goal(s): o Over the next 180 days, patient will work with PharmD and providers to maintain optimal medication adherence . [facility] . Interventions o Comprehensive medication review performed. o Continue current medication management strategy . Patient self care activities - Over the next 180 days, patient will: o Focus on medication adherence by fill date o Take medications as prescribed o Report any questions or concerns to PharmD and/or provider(s)  Initial goal documentation       Jonathan [name] was given information about Chronic Care Management services today including:  1. CCM service includes personalized support from designated clinical staff supervised by his physician, including individualized plan of care and coordination with other care providers 2. 24/7 contact phone numbers for assistance for urgent and routine care needs. 3. Standard insurance, coinsurance, copays and deductibles apply for chronic care management only during months in which we provide at least 20 minutes of these services. Most insurances cover these services at 100%, however patients may be responsible for any copay, coinsurance and/or deductible if applicable. This service may help you avoid the need for more expensive face-to-face services. 4. Only one practitioner may furnish and bill the service in a calendar month. 5. The patient may stop CCM services at any time (effective at the end of the month) by phone  call to the office staff.  Patient agreed to services and verbal consent obtained.   The patient verbalized understanding of instructions provided today and agreed to receive a mailed copy of patient instruction and/or educational materials. Telephone  follow up appointment with pharmacy team member scheduled for: 6 months  Charlene Brooke, PharmD Clinical Pharmacist [facility] [phone]  Cholesterol Content in Foods Cholesterol is a waxy, fat-like substance that helps to carry fat in the blood. The body needs cholesterol in small amounts, but too much cholesterol can cause damage to the arteries and heart. Most people should eat less than 200 milligrams (mg) of cholesterol a day. Foods with cholesterol  Cholesterol is found in animal-based foods, such as meat, seafood, and dairy. Generally, low-fat dairy and lean meats have less cholesterol than full-fat dairy and fatty meats. The milligrams of cholesterol per serving (mg per serving) of common cholesterol-containing foods are listed below. Meat and other proteins  Egg -- one large whole egg has 186 mg.  Veal shank -- 4 oz has 141 mg.  Lean ground [place] (93% lean) -- 4 oz has 118 mg.  Fat-trimmed lamb loin -- 4 oz has 106 mg.  Lean ground beef (90% lean) -- 4 oz has 100 mg.  Lobster -- 3.5 oz has 90 mg.  Pork loin chops -- 4 oz has 86 mg.  Canned salmon -- 3.5 oz has 83 mg.  Fat-trimmed beef top loin -- 4 oz has 78 mg.  Frankfurter -- 1 frank (3.5 oz) has 77 mg.  Crab -- 3.5 oz has 71 mg.  Roasted chicken without skin, white meat -- 4 oz has 66 mg.  Light bologna -- 2 oz has 45 mg.  Deli-cut [place] -- 2 oz has 31 mg.  Canned tuna -- 3.5 oz has 31 mg.  Berniece Salines -- 1 oz has 29 mg.  Oysters and mussels (raw) -- 3.5 oz has 25 mg.  Mackerel -- 1 oz has 22 mg.  Trout -- 1 oz has 20 mg.  Pork sausage -- 1 link (1 oz) has 17 mg.  Salmon -- 1 oz has 16 mg.  Tilapia -- 1 oz has 14 mg. Dairy  Soft-serve ice cream --  cup (4 oz) has 103 mg.  Whole-milk yogurt -- 1 cup (8 oz) has 29 mg.  Cheddar cheese -- 1 oz has 28 mg.  American cheese -- 1 oz has 28 mg.  Whole milk -- 1 cup (8 oz) has 23 mg.  2% milk -- 1 cup (8 oz) has 18  mg.  Cream cheese -- 1 tablespoon (Tbsp) has 15 mg.  Cottage cheese --  cup (4 oz) has 14 mg.  Low-fat (1%) milk -- 1 cup (8 oz) has 10 mg.  Sour cream -- 1 Tbsp has 8.5 mg.  Low-fat yogurt -- 1 cup (8 oz) has 8 mg.  Nonfat Greek yogurt -- 1 cup (8 oz) has 7 mg.  Half-and-half cream -- 1 Tbsp has 5 mg. Fats and oils  Cod liver oil -- 1 tablespoon (Tbsp) has 82 mg.  Butter -- 1 Tbsp has 15 mg.  Lard -- 1 Tbsp has 14 mg.  Bacon grease -- 1 Tbsp has 14 mg.  Mayonnaise -- 1 Tbsp has 5-10 mg.  Margarine -- 1 Tbsp has 3-10 mg. Exact amounts of cholesterol in these foods may vary depending on specific ingredients and brands. Foods without cholesterol Most plant-based foods do not have cholesterol unless you combine them with  a food that has cholesterol. Foods without cholesterol include:  Grains and cereals.  Vegetables.  Fruits.  Vegetable oils, such as olive, canola, and sunflower oil.  Legumes, such as peas, beans, and lentils.  Nuts and seeds.  Egg whites. Summary  The body needs cholesterol in small amounts, but too much cholesterol can cause damage to the arteries and heart.  Most people should eat less than 200 milligrams (mg) of cholesterol a day. This information is not intended to replace advice given to you by your health care provider. Make sure you discuss any questions you have with your health care provider. Document Revised: 04/28/2017 Document Reviewed: 01/10/2017 Elsevier Patient Education  [place].

## 2019-12-25 NOTE — Chronic Care Management (AMB) (Signed)
Chronic Care Management Pharmacy  Name: Jonathan [name]  MRN: [id number] DOB: 10-03-1955   Chief Complaint/ HPI  Jonathan [name],  64 y.o. , male presents for their Initial CCM visit with the clinical pharmacist via telephone due to COVID-19 Pandemic.  PCP : Jonathan Lima, MD Patient Care Team: Jonathan Lima, MD as PCP - General Minus Breeding, MD as PCP - Cardiology (Cardiology) Charlton Haws, [facility] as Pharmacist (Pharmacist)  Their chronic conditions include: Hypertension, Hyperlipidemia, Heart Failure, GERD, Tobacco use, BPH and Allergic Rhinitis   Office Visits: 10/24/19 [name] OV: HR 47 on EKG, rec'd cardiac monitor. Started indapamide (to replace Edarbyclor)  Consult Visit: 11/04/19 Dr Percival Spanish (cardiology): f/u diastolic HF and bradycardia. Instructed to d/c indapamide due to controlled BP without it. Also d/c'd pitavastatin d/t pt not taking.  09/15/19 NP Cleaver (cardiology): continue same meds.  Allergies  Allergen Reactions  . Cortisone     REACTION: rash, "act different"  . Lipitor [Atorvastatin Calcium] Other (See Comments)    "side pain"    Medications: Outpatient Encounter Medications as of 12/25/2019  Medication Sig  . dutasteride (AVODART) 0.5 MG capsule TAKE 1 CAPSULE (0.5 MG TOTAL) BY MOUTH DAILY.  Mar[place] Kitchen HYDROcodone-acetaminophen (NORCO) 10-325 MG tablet Take 1 tablet by mouth every 6 (six) hours as needed for moderate pain.  . naloxone (NARCAN) nasal spray 4 mg/0.1 mL Place 1 spray into the nose once.  . [DISCONTINUED] DEXILANT 60 MG capsule TAKE 1 CAPSULE BY MOUTH EVERY DAY  . [DISCONTINUED] dutasteride (AVODART) 0.5 MG capsule TAKE 1 CAPSULE (0.5 MG TOTAL) BY MOUTH DAILY.   No facility-administered encounter medications on file as of 12/25/2019.     Current Diagnosis/Assessment:  SDOH Interventions     Most Recent Value  SDOH Interventions  Financial Strain Interventions Intervention Not Indicated      Goals Addressed              This Visit's Progress   . Pharmacy Care Plan       CARE PLAN ENTRY (see longitudinal plan of care for additional care plan information)  Current Barriers:  . Chronic Disease Management support, education, and care coordination needs related to Hypertension, Hyperlipidemia, Heart Failure, and GERD   Hypertension / Heart failure BP Readings from Last 3 Encounters:  11/04/19 130/80  10/24/19 122/82  09/05/19 128/84 .  Pharmacist Clinical Goal(s): o Over the next 180 days, patient will work with PharmD and providers to maintain BP goal <130/80 . Current regimen:  o No medications . Interventions: o Discussed BP goals and benefits of diet/exercise for prevention of heart attack / stroke . Patient self care activities - Over the next 180 days, patient will: o Check BP as needed, document, and provide at future appointments o Ensure daily salt intake < 2300 mg/day  Hyperlipidemia Lab Results  Component Value Date/Time   LDLCALC 106 (H) 12/25/2018 11:09 AM   LDLDIRECT 131.6 07/14/2011 09:40 AM .  Pharmacist Clinical Goal(s): o Over the next 180 days, patient will work with PharmD and providers to achieve LDL goal < 100 . Current regimen:  o No medications . Interventions: o Discussed cholesterol goals and benefits of diet/exercise for prevention of heart attack / stroke . Patient self care activities - Over the next 180 days, patient will: o Limit cholesterol in diet (see attached)  GERD . Pharmacist Clinical Goal(s) o Over the next 180 days, patient will work with PharmD and providers to optimize therapy .  Current regimen:  o Dexilant 60 mg daily . Interventions: o Discussed daily use of Dexilant and optimal timing before a meal . Patient self care activities - Over the next 180 days, patient will: o Continue medication as prescribed o Avoid foods that trigger reflux/heartburn  Medication management . Pharmacist Clinical Goal(s): o Over the next 180 days,  patient will work with PharmD and providers to maintain optimal medication adherence . [facility] . Interventions o Comprehensive medication review performed. o Continue current medication management strategy . Patient self care activities - Over the next 180 days, patient will: o Focus on medication adherence by fill date o Take medications as prescribed o Report any questions or concerns to PharmD and/or provider(s)  Initial goal documentation       Heart Failure   Type: Diastolic  Last ejection fraction: 60-65% (11/22/19)  BP goal is:  <130/80 BP Readings from Last 3 Encounters:  11/04/19 130/80  10/24/19 122/82  09/05/19 128/84   Kidney Function Lab Results  Component Value Date/Time   CREATININE 1.04 08/15/2019 04:08 PM   CREATININE 0.87 04/24/2019 09:28 AM   GFR 86.46 08/15/2019 04:08 PM   GFRNONAA >60 05/15/2018 08:22 AM   GFRAA >60 05/15/2018 08:22 AM   K 4.3 08/15/2019 04:08 PM   K 4.3 04/24/2019 09:28 AM   Patient has failed these meds in past: amlodipine, Edarbyclor, indapamide, Bystolic Patient is currently controlled on the following medications:  . No medications  We discussed diet and exercise extensively; BP goals; importance of limiting salt in diet  Plan  Continue control with diet and exercise     Hyperlipidemia   LDL goal < 100  Lipid Panel     Component Value Date/Time   CHOL 189 12/25/2018 1109   TRIG 177.0 (H) 12/25/2018 1109   HDL 47.50 12/25/2018 1109   LDLCALC 106 (H) 12/25/2018 1109   LDLDIRECT 131.6 07/14/2011 0940    The 10-year ASCVD risk score Mikey Bussing DC Jr., et al., 2013) is: 17.5%   Values used to calculate the score:     Age: 73 years     Sex: Male     Is Non-Hispanic African American: Yes     Diabetic: No     Tobacco smoker: Yes     Systolic Blood Pressure: 409 mmHg     Is BP treated: No     HDL Cholesterol: 47.5 mg/dL     Total Cholesterol: 189 mg/dL   Patient has failed these meds in past:  pitavastatin, atorvastatin ("side pain") Patient is currently uncontrolled on the following medications:  . No medication  We discussed:  diet and exercise extensively; cholesterol goals; diet changes to improve cholesterol  Plan  Continue control with diet and exercise  BPH   PSA  Date Value Ref Range Status  04/24/2019 2.3  Final  12/25/2018 4.42 (H) 0.10 - 4.00 ng/mL Final    Comment:    Test performed using Access Hybritech PSA Assay, a parmagnetic partical, chemiluminecent immunoassay.  07/05/2017 1.53  Final    Patient has failed these meds in past: n/a Patient is currently controlled on the following medications:  . Dutasteride 0.5 mg daily  We discussed:  Pt reports compliance with medication and denies issues  Plan  Continue current medications  GERD   Patient has failed these meds in past: n/a Patient is currently controlled on the following medications:  . Dexilant 60 mg  We discussed: Pt reports reflux is controlled as long as he  takes PPI, he does have worse symptoms if he stops taking it.  Plan  Continue current medications  Tobacco Abuse   Tobacco Status:  Social History   Tobacco Use  Smoking Status Current Some Day Smoker  . Packs/day: 0.10  . Years: 30.00  . Pack years: 3.00  . Types: Cigarettes, Cigars  Smokeless Tobacco Never Used   Previous quit attempts included: n/a  We discussed:  Pt does not smoke every day; discussed benefits of 100% cessation, consequences of continued tobacco use. Pt knows he can quit entirely and will continue to work toward that  Plan  Continue current medications  Chronic back pain   Spinal stenosis of lumbar region  Patient has failed these meds in past: n/a Patient is currently controlled on the following medications:  . Hydrocodone-APAP 10-325 mg every 6h PRN  We discussed:  Pt reports pain is controlled with medication, he has been prescribed Narcan for emergency.  Plan  Continue current  medications  Medication Management   [facility] Uses pill box? No - prefers bottles Pt endorses 100% compliance  We discussed[facility], pt does not endorse issues with pharmacy services  Plan  Continue current medication management strategy    Follow up: 6 month phone visit  Charlene Brooke, PharmD, BCACP Clinical Pharmacist [facility] at [facility] [phone]

## 2019-12-27 NOTE — Addendum Note (Signed)
Addended by: Karle Barr on: 12/27/2019 02:14 PM   Modules accepted: Orders

## 2020-01-14 ENCOUNTER — Telehealth: Payer: Self-pay | Admitting: Internal Medicine

## 2020-01-14 NOTE — Telephone Encounter (Signed)
New Message:   1.Medication Requested: HYDROcodone-acetaminophen (NORCO) 10-325 MG tablet 2. Pharmacy (Name, Street, [place]): [id number] [place], [place] [address] 3. On Med List: Yes  4. Last Visit with PCP: 10/24/19  5. Next visit date with PCP: None   Agent: Please be advised that RX refills may take up to 3 business days. We ask that you follow-up with your pharmacy.

## 2020-01-18 ENCOUNTER — Other Ambulatory Visit: Payer: Self-pay | Admitting: Internal Medicine

## 2020-01-18 DIAGNOSIS — M87052 Idiopathic aseptic necrosis of left femur: Secondary | ICD-10-CM

## 2020-01-18 DIAGNOSIS — M48061 Spinal stenosis, lumbar region without neurogenic claudication: Secondary | ICD-10-CM

## 2020-01-18 DIAGNOSIS — M87051 Idiopathic aseptic necrosis of right femur: Secondary | ICD-10-CM

## 2020-01-18 MED ORDER — HYDROCODONE-ACETAMINOPHEN 10-325 MG PO TABS: 1.0000 | ORAL_TABLET | Freq: Four times a day (QID) | ORAL | 0 refills | Status: DC | PRN

## 2020-02-17 ENCOUNTER — Telehealth: Payer: Self-pay | Admitting: Internal Medicine

## 2020-02-17 DIAGNOSIS — M87051 Idiopathic aseptic necrosis of right femur: Secondary | ICD-10-CM

## 2020-02-17 DIAGNOSIS — M48061 Spinal stenosis, lumbar region without neurogenic claudication: Secondary | ICD-10-CM

## 2020-02-17 MED ORDER — HYDROCODONE-ACETAMINOPHEN 10-325 MG PO TABS: 1.0000 | ORAL_TABLET | Freq: Four times a day (QID) | ORAL | 0 refills | Status: DC | PRN

## 2020-02-17 NOTE — Telephone Encounter (Signed)
   1.Medication Requested: HYDROcodone-acetaminophen (NORCO) 10-325 MG tablet  2. Pharmacy (Name, [place], City):[id number] [place], [place] [address]  3. On Med List: yes  4. Last Visit with PCP: 10/24/19  5. Next visit date with PCP: n/a   Agent: Please be advised that RX refills may take up to 3 business days. We ask that you follow-up with your pharmacy.

## 2020-02-17 NOTE — Addendum Note (Signed)
Addended by: Binnie Rail on: 02/17/2020 12:35 PM   Modules accepted: Orders

## 2020-03-14 ENCOUNTER — Other Ambulatory Visit: Payer: Self-pay | Admitting: Internal Medicine

## 2020-03-16 ENCOUNTER — Telehealth: Payer: Self-pay | Admitting: Internal Medicine

## 2020-03-16 NOTE — Telephone Encounter (Signed)
  1.Medication Requested: HYDROcodone-acetaminophen (NORCO) 10-325 MG tablet  2. Pharmacy (Name, [place], City):[id number] [place], [place] [address]  3. On Med List: yes  4. Last Visit with PCP: 10/24/19  5. Next visit date with PCP: n/a

## 2020-03-18 ENCOUNTER — Other Ambulatory Visit: Payer: Self-pay | Admitting: Internal Medicine

## 2020-03-18 DIAGNOSIS — M48061 Spinal stenosis, lumbar region without neurogenic claudication: Secondary | ICD-10-CM

## 2020-03-18 DIAGNOSIS — M87052 Idiopathic aseptic necrosis of left femur: Secondary | ICD-10-CM

## 2020-03-18 DIAGNOSIS — M87051 Idiopathic aseptic necrosis of right femur: Secondary | ICD-10-CM

## 2020-03-18 MED ORDER — HYDROCODONE-ACETAMINOPHEN 10-325 MG PO TABS: 1.0000 | ORAL_TABLET | Freq: Four times a day (QID) | ORAL | 0 refills | Status: DC | PRN

## 2020-04-02 ENCOUNTER — Encounter: Payer: Self-pay | Admitting: Nurse Practitioner

## 2020-04-02 ENCOUNTER — Ambulatory Visit (INDEPENDENT_AMBULATORY_CARE_PROVIDER_SITE_OTHER): Payer: Medicare Other | Admitting: Nurse Practitioner

## 2020-04-02 VITALS — BP 102/74 | HR 68 | Ht 72.0 in | Wt 200.0 lb

## 2020-04-02 DIAGNOSIS — K219 Gastro-esophageal reflux disease without esophagitis: Secondary | ICD-10-CM

## 2020-04-02 DIAGNOSIS — Z86010 Personal history of colonic polyps: Secondary | ICD-10-CM | POA: Diagnosis not present

## 2020-04-02 NOTE — Patient Instructions (Signed)
Continue Dexilant daily.  Please follow up in one year or as needed.  Your next colonoscopy is due 08/2026

## 2020-04-02 NOTE — Progress Notes (Signed)
04/02/2020 Dimitri Ped III [id number] 1956-04-29   Chief Complaint: GERD follow up  History of Present Illness: Jonathan [name] is a 64 year old male with a past medical history of anxiety, arthritis, mitral valve prolapse, diastolic CHF, colon polyps and GERD.  He presents today for GERD follow-up.  He takes Dexilant 60 mg once daily which controls his GERD symptoms.  He stopped taking Dexilant for few days in June which resulted in heartburn.  He restarted the Dexilant and his heartburn symptoms abated.  He denies having any dysphagia.  No upper or lower abdominal pain.  He is passing a normal formed brown bowel movement daily.  No rectal bleeding or melena.  His appetite is good and his weight is stable.  He underwent an EGD and colonoscopy by Dr. Silverio Decamp 09/02/2019 which showed a small hiatal hernia, a nonobstructing Schatzki's ring, gastric biopsies were benign without evidence of H. pylori.  The colonoscopy identified for small polyp tubular adenomatous/hyperplastic polyps removed from the rectum and transverse colon.  He was advised to repeat a colonoscopy in 7 years.  He has chronic leg and back pain.  No other complaints today.    EGD 09/02/2019: - Z-line regular, 38 cm from the incisors. - Non-obstructing Schatzki ring. - Gastroesophageal flap valve classified as Hill Grade IV (no fold, wide open lumen, hiatal hernia present). - Small hiatal hernia. - Erythematous mucosa in the stomach. Biopsied. - Normal examined duodenum.  Colonoscopy 09/02/2019: - Four 4 to 7 mm polyps in the rectum and in the transverse colon, removed with a cold snare. Resected and retrieved. - Moderate diverticulosis in the sigmoid colon and in the descending colon. Peri-diverticular erythema was seen. Biopsied. - Non-bleeding internal hemorrhoids. -Recall colonoscopy 7 years   1. Surgical [P], gastric antrum and body - BENIGN GASTRIC MUCOSA. [place] IS NEGATIVE FOR HELICOBACTER  PYLORI. - NO INTESTINAL METAPLASIA, DYSPLASIA, OR MALIGNANCY. 2. Surgical [P], colon, transverse, rectal, polyp (4) - TUBULAR ADENOMA. - HYPERPLASTIC POLYP. - NO HIGH GRADE DYSPLASIA OR MALIGNANCY. 3. Surgical [P], colon, transverse - BENIGN COLONIC MUCOSA WITH FOCAL EXTRAVASATED RED BLOOD CELLS. - NO ACTIVE INFLAMMATION OR EVIDENCE OF MICROSCOPIC COLITIS. - NO DYSPLASIA OR MALIGNANCY.  Echo 11/22/2019: 1. Compared to report from 2013, MR is more prominent. 2. Left ventricular ejection fraction, by estimation, is 60 to 65%. The left ventricle has normal function. The left ventricle has no regional wall motion abnormalities. Left ventricular diastolic parameters were normal. 3. Right ventricular systolic function is normal. The right ventricular size is normal. There is mildly elevated pulmonary artery systolic pressure. 4. Left atrial size was mildly dilated. 5. Right atrial size was mildly dilated. 6. . Mild aortic valve sclerosis is present, with no evidence of aortic valve stenosis.  Current Outpatient Medications on File Prior to Visit  Medication Sig Dispense Refill  . dexlansoprazole (DEXILANT) 60 MG capsule Take 60 mg by mouth daily.    Marland Kitchen dutasteride (AVODART) 0.5 MG capsule TAKE 1 CAPSULE (0.5 MG TOTAL) BY MOUTH DAILY. 90 capsule 1  . HYDROcodone-acetaminophen (NORCO) 10-325 MG tablet Take 1 tablet by mouth every 6 (six) hours as needed for moderate pain. 90 tablet 0  . naloxone (NARCAN) nasal spray 4 mg/0.1 mL Place 1 spray into the nose once.    . [DISCONTINUED] dutasteride (AVODART) 0.5 MG capsule TAKE 1 CAPSULE (0.5 MG TOTAL) BY MOUTH DAILY. 90 capsule 1  . [DISCONTINUED] DEXILANT 60 MG capsule TAKE 1 CAPSULE BY MOUTH EVERY DAY 90  capsule 1   No current facility-administered medications on file prior to visit.   Allergies  Allergen Reactions  . Cortisone     REACTION: rash, "act different"  . Lipitor [Atorvastatin Calcium] Other (See Comments)    "side pain"      Current Medications, Allergies, Past Medical History, Past Surgical History, Family History and Social History were reviewed in [employer] record.   Review of Systems:   Constitutional: Negative for fever, sweats, chills or weight loss.  Respiratory: Negative for shortness of breath.   Cardiovascular: Negative for chest pain, palpitations and leg swelling.  Gastrointestinal: See HPI.  Musculoskeletal: + back and leg pain.   Neurological: Negative for dizziness, headaches or paresthesias.    Physical Exam: Ht 6' (1.829 m)   Wt 200 lb (90.7 kg)   BMI 27.12 kg/m   BP 102/74   Pulse 68   Ht 6' (1.829 m)   Wt 200 lb (90.7 kg)   BMI 27.12 kg/m   General: Well developed 64 year old male in no acute distress. Head: Normocephalic and atraumatic. Eyes: No scleral icterus. Conjunctiva pink . Ears: Normal auditory acuity. Mouth: Dentition intact. No ulcers or lesions.  Lungs: Clear throughout to auscultation. Heart: Regular rate and rhythm, no murmur. Abdomen: Soft, nontender and nondistended. No masses or hepatomegaly. Normal bowel sounds x 4 quadrants. Large upper abdominal traumata scar intact.  Rectal: Deferred.  Musculoskeletal: Symmetrical with no gross deformities. Extremities: No edema. Neurological: Alert oriented x 4. No focal deficits.  Psychological: Alert and cooperative. Normal mood and affect  Assessment and Recommendations: 46.  64 year old male with a history of GERD and a nonobstructing Schatzki's ring -Continue Dexilant 60 mg daily -Follow-up in our office in 1 year and as needed -Patient to call our office if he develops GERD or dysphagia symptoms  2.  History of colon polyps -Next colonoscopy due 08/2026

## 2020-04-14 ENCOUNTER — Telehealth: Payer: Self-pay | Admitting: Internal Medicine

## 2020-04-14 NOTE — Telephone Encounter (Signed)
   1.Medication Requested: HYDROcodone-acetaminophen (NORCO) 10-325 MG tablet [id number] [place], [place] - [place]. 2. Pharmacy (Name, Street, [place]):  3. On Med List: yes  4. Last Visit with PCP: 10/24/19  5. Next visit date with PCP: n/a   Agent: Please be advised that RX refills may take up to 3 business days. We ask that you follow-up with your pharmacy.

## 2020-04-15 ENCOUNTER — Other Ambulatory Visit: Payer: Self-pay | Admitting: Internal Medicine

## 2020-04-17 ENCOUNTER — Other Ambulatory Visit: Payer: Self-pay | Admitting: Internal Medicine

## 2020-04-17 DIAGNOSIS — M87051 Idiopathic aseptic necrosis of right femur: Secondary | ICD-10-CM

## 2020-04-17 DIAGNOSIS — M48061 Spinal stenosis, lumbar region without neurogenic claudication: Secondary | ICD-10-CM

## 2020-04-17 MED ORDER — HYDROCODONE-ACETAMINOPHEN 10-325 MG PO TABS: 1.0000 | ORAL_TABLET | Freq: Four times a day (QID) | ORAL | 0 refills | Status: DC | PRN

## 2020-04-28 NOTE — Progress Notes (Signed)
Reviewed and agree with documentation and assessment and plan. K. Veena Marquez Ceesay , MD   

## 2020-05-02 ENCOUNTER — Other Ambulatory Visit: Payer: Self-pay | Admitting: Internal Medicine

## 2020-05-02 DIAGNOSIS — J301 Allergic rhinitis due to pollen: Secondary | ICD-10-CM

## 2020-05-11 ENCOUNTER — Ambulatory Visit (INDEPENDENT_AMBULATORY_CARE_PROVIDER_SITE_OTHER): Payer: Medicare Other | Admitting: Otolaryngology

## 2020-05-14 ENCOUNTER — Other Ambulatory Visit: Payer: Self-pay | Admitting: Internal Medicine

## 2020-05-14 ENCOUNTER — Telehealth: Payer: Self-pay | Admitting: Internal Medicine

## 2020-05-14 DIAGNOSIS — M48061 Spinal stenosis, lumbar region without neurogenic claudication: Secondary | ICD-10-CM

## 2020-05-14 DIAGNOSIS — M87051 Idiopathic aseptic necrosis of right femur: Secondary | ICD-10-CM

## 2020-05-14 MED ORDER — HYDROCODONE-ACETAMINOPHEN 10-325 MG PO TABS: 1.0000 | ORAL_TABLET | Freq: Four times a day (QID) | ORAL | 0 refills | Status: DC | PRN

## 2020-05-14 NOTE — Telephone Encounter (Signed)
    Patient requesting refill for HYDROcodone-acetaminophen (NORCO) 10-325 MG tablet Pharmacy:[id number] [place], [place] - [place]. Last ov 10/24/19 Next appt 05/19/20

## 2020-05-19 ENCOUNTER — Encounter: Payer: Self-pay | Admitting: Internal Medicine

## 2020-05-19 ENCOUNTER — Ambulatory Visit (INDEPENDENT_AMBULATORY_CARE_PROVIDER_SITE_OTHER): Payer: Medicare Other | Admitting: Internal Medicine

## 2020-05-19 ENCOUNTER — Other Ambulatory Visit: Payer: Self-pay

## 2020-05-19 VITALS — BP 128/80 | HR 60 | Temp 98.2°F | Resp 16 | Ht 72.0 in | Wt 207.0 lb

## 2020-05-19 DIAGNOSIS — Z0000 Encounter for general adult medical examination without abnormal findings: Secondary | ICD-10-CM | POA: Diagnosis not present

## 2020-05-19 DIAGNOSIS — I10 Essential (primary) hypertension: Secondary | ICD-10-CM

## 2020-05-19 DIAGNOSIS — N401 Enlarged prostate with lower urinary tract symptoms: Secondary | ICD-10-CM | POA: Diagnosis not present

## 2020-05-19 DIAGNOSIS — R739 Hyperglycemia, unspecified: Secondary | ICD-10-CM

## 2020-05-19 DIAGNOSIS — Z0001 Encounter for general adult medical examination with abnormal findings: Secondary | ICD-10-CM

## 2020-05-19 DIAGNOSIS — E785 Hyperlipidemia, unspecified: Secondary | ICD-10-CM

## 2020-05-19 DIAGNOSIS — F5104 Psychophysiologic insomnia: Secondary | ICD-10-CM | POA: Insufficient documentation

## 2020-05-19 DIAGNOSIS — R001 Bradycardia, unspecified: Secondary | ICD-10-CM | POA: Diagnosis not present

## 2020-05-19 DIAGNOSIS — Z79891 Long term (current) use of opiate analgesic: Secondary | ICD-10-CM | POA: Diagnosis not present

## 2020-05-19 DIAGNOSIS — N138 Other obstructive and reflux uropathy: Secondary | ICD-10-CM

## 2020-05-19 LAB — BASIC METABOLIC PANEL
BUN: 9 mg/dL (ref 6–23)
CO2: 32 mEq/L (ref 19–32)
Calcium: 9.6 mg/dL (ref 8.4–10.5)
Chloride: 99 mEq/L (ref 96–112)
Creatinine, Ser: 1.03 mg/dL (ref 0.40–1.50)
GFR: 75.57 mL/min (ref >60.00)
Glucose, Bld: 102 mg/dL — ABNORMAL HIGH (ref 70–99)
Potassium: 3.9 mEq/L (ref 3.5–5.1)
Sodium: 136 mEq/L (ref 135–145)

## 2020-05-19 LAB — URINALYSIS, ROUTINE W REFLEX MICROSCOPIC
Bilirubin Urine: NEGATIVE
Hgb urine dipstick: NEGATIVE
Ketones, ur: NEGATIVE
Leukocytes,Ua: NEGATIVE
Nitrite: NEGATIVE
Specific Gravity, Urine: >=1.03 — AB (ref 1.000–1.030)
Total Protein, Urine: NEGATIVE
Urine Glucose: NEGATIVE
Urobilinogen, UA: 0.2 (ref 0.0–1.0)
pH: 6 (ref 5.0–8.0)

## 2020-05-19 LAB — HEPATIC FUNCTION PANEL
ALT: 19 U/L (ref 0–53)
AST: 14 U/L (ref 0–37)
Albumin: 4.8 g/dL (ref 3.5–5.2)
Alkaline Phosphatase: 65 U/L (ref 39–117)
Bilirubin, Direct: 0.1 mg/dL (ref 0.0–0.3)
Total Bilirubin: 0.6 mg/dL (ref 0.2–1.2)
Total Protein: 7.4 g/dL (ref 6.0–8.3)

## 2020-05-19 LAB — CBC WITH DIFFERENTIAL/PLATELET
Basophils Absolute: 0 10*3/uL (ref 0.0–0.1)
Basophils Relative: 0.9 % (ref 0.0–3.0)
Eosinophils Absolute: 0 10*3/uL (ref 0.0–0.7)
Eosinophils Relative: 1.2 % (ref 0.0–5.0)
HCT: 46 % (ref 39.0–52.0)
Hemoglobin: 15.7 g/dL (ref 13.0–17.0)
Lymphocytes Relative: 48.8 % — ABNORMAL HIGH (ref 12.0–46.0)
Lymphs Abs: 1.8 10*3/uL (ref 0.7–4.0)
MCHC: 34.2 g/dL (ref 30.0–36.0)
MCV: 82.9 fl (ref 78.0–100.0)
Monocytes Absolute: 0.5 10*3/uL (ref 0.1–1.0)
Monocytes Relative: 12.6 % — ABNORMAL HIGH (ref 3.0–12.0)
Neutro Abs: 1.3 10*3/uL — ABNORMAL LOW (ref 1.4–7.7)
Neutrophils Relative %: 36.5 % — ABNORMAL LOW (ref 43.0–77.0)
Platelets: 241 10*3/uL (ref 150.0–400.0)
RBC: 5.54 Mil/uL (ref 4.22–5.81)
RDW: 14.3 % (ref 11.5–15.5)
WBC: 3.6 10*3/uL — ABNORMAL LOW (ref 4.0–10.5)

## 2020-05-19 LAB — LIPID PANEL
Cholesterol: 232 mg/dL — ABNORMAL HIGH (ref 0–200)
HDL: 52.5 mg/dL (ref >39.00)
LDL Cholesterol: 154 mg/dL — ABNORMAL HIGH (ref 0–99)
NonHDL: 179.86
Total CHOL/HDL Ratio: 4
Triglycerides: 129 mg/dL (ref 0.0–149.0)
VLDL: 25.8 mg/dL (ref 0.0–40.0)

## 2020-05-19 LAB — TSH: TSH: 2.49 u[IU]/mL (ref 0.35–4.50)

## 2020-05-19 LAB — PSA: PSA: 2.21 ng/mL (ref 0.10–4.00)

## 2020-05-19 LAB — HEMOGLOBIN A1C: Hgb A1c MFr Bld: 6 % (ref 4.6–6.5)

## 2020-05-19 MED ORDER — ESZOPICLONE 2 MG PO TABS: 2.0000 mg | ORAL_TABLET | Freq: Every evening | ORAL | 1 refills | Status: DC | PRN

## 2020-05-19 MED ORDER — DUTASTERIDE 0.5 MG PO CAPS: 0.5000 mg | ORAL_CAPSULE | Freq: Every day | ORAL | 1 refills | Status: DC

## 2020-05-19 NOTE — Patient Instructions (Signed)
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
## 2020-05-21 MED ORDER — LIVALO 2 MG PO TABS: 1.0000 | ORAL_TABLET | Freq: Every day | ORAL | 1 refills | Status: DC

## 2020-05-22 ENCOUNTER — Other Ambulatory Visit: Payer: Self-pay | Admitting: Internal Medicine

## 2020-05-22 DIAGNOSIS — I10 Essential (primary) hypertension: Secondary | ICD-10-CM

## 2020-05-22 DIAGNOSIS — I5189 Other ill-defined heart diseases: Secondary | ICD-10-CM

## 2020-06-04 ENCOUNTER — Other Ambulatory Visit: Payer: Self-pay | Admitting: Internal Medicine

## 2020-06-04 LAB — URINE DRUGS OF ABUSE SCREEN W ALC, ROUTINE (REF LAB)
Amphetamines, Urine: NEGATIVE ng/mL
Barbiturate Quant, Ur: NEGATIVE ng/mL
Benzodiazepine Quant, Ur: NEGATIVE ng/mL
Cocaine (Metab.): NEGATIVE ng/mL
Ethanol, Urine: NEGATIVE %
Methadone Screen, Urine: NEGATIVE ng/mL
PCP Quant, Ur: NEGATIVE ng/mL
Propoxyphene: NEGATIVE ng/mL

## 2020-06-04 LAB — PANEL 799049
CARBOXY THC GC/MS CONF: 154 ng/mL
Cannabinoid GC/MS, Ur: POSITIVE — AB

## 2020-06-04 LAB — OPIATES CONFIRMATION, URINE: OPIATES: NEGATIVE

## 2020-06-12 ENCOUNTER — Telehealth: Payer: Self-pay | Admitting: Internal Medicine

## 2020-06-12 NOTE — Telephone Encounter (Signed)
   Patient requesting refill for dexlansoprazole (DEXILANT) 60 MG capsule and HYDROcodone-acetaminophen (NORCO) 10-325 MG tablet  Pharmacy [id number] [place], [place].

## 2020-06-15 NOTE — Progress Notes (Deleted)
Cardiology Clinic Note   Patient Name: Jonathan [name] Date of Encounter: 06/15/2020  Primary Care Provider:  Janith Lima, MD Primary Cardiologist:  Minus Breeding, MD  Patient Profile    Jonathan [name] 65 year old male presents today for follow-up of his hypertension and diastolic heart failure.  Past Medical History    Past Medical History:  Diagnosis Date  . Anxiety   . Arthritis   . Avascular necrosis of bone (HCC)    left hip   . Back pain, lumbosacral 2000  . Blind right eye   . Colon polyps 08/29/2011   Hyperplastic colon polyps  . Mitral valve prolapse    Past Surgical History:  Procedure Laterality Date  . EYE SURGERY  2007   Fractured bones repaired/blind in right eye  . HERNIA REPAIR     Right inguinal  . SPINE SURGERY    . TOTAL HIP ARTHROPLASTY Right 09/18/2014   Procedure: RIGHT TOTAL HIP ARTHROPLASTY ANTERIOR APPROACH;  Surgeon: Rod Can, MD;  Location: [facility];  Service: Orthopedics;  Laterality: Right;  . TOTAL HIP ARTHROPLASTY Left 04/06/2017   Procedure: LEFT TOTAL HIP ARTHROPLASTY ANTERIOR APPROACH;  Surgeon: Rod Can, MD;  Location: [facility];  Service: Orthopedics;  Laterality: Left;  Needs RNFA    Allergies  Allergies  Allergen Reactions  . Cortisone     REACTION: rash, "act different"  . Lipitor [Atorvastatin Calcium] Other (See Comments)    "side pain"    History of Present Illness    Mr. Valent was last seen by Dr. Percival Spanish on 07/29/2011. During that time he was being evaluated due to a abnormal EKG with peaked T wave and poor anterior R wave progression suggestive of an old anterior infarct. He underwent a treadmill stress test which was abnormal and subsequently underwent a nuclear stress test that showed good exercise capacity,normal blood pressure, no significant ST segment change suggestive of ischemia. Low risk nuclear study.  His PMH also includes essential hypertension, GERD, tobacco abuse,  hyperglycemia, dyslipidemia and grade 2 diastolic dysfunction.  He presented to the clinic 02/25/19 and statedhe felt well. He worked daily as an Cabin crew and stated every other day he walked 3 miles or more. He also stated he routinely does calisthenic type activities including push-ups sit ups and jumping jacks, several days a week, without difficulty. He presented  for follow-up evaluation based on recommendation from his primary care provider.  He underwent colonoscopy on 09/02/2019 without difficulties.  He presented to the clinic 09/05/19 for follow-up and stated he felt well.  He continued to be very physically active.  He was eating a heart healthy diet.  He underwent colonoscopy procedure and EKG procedure on 09/02/2019.  He stated that 4 polyps were removed during his colonoscopy and he was awaiting results.  His blood pressure was well controlled and his EKG today showed normal sinus rhythm.  I planned follow-up in 1 year.  He was seen in follow-up by Dr. Percival Spanish on 11/04/2019.  During that time he continued to feel well.  He was noted to have a cardiac murmur and mitral valve prolapse was suspected.  Echocardiogram 11/22/2019 showed EF 60-65%, mild mitral regurgitation.  No changes were made in his medical management.  Hedenies chest pain, shortness of breath, lower extremity edema, fatigue, palpitations, melena, hematuria, hemoptysis, diaphoresis, weakness, presyncope, syncope, orthopnea, and PND.  Home Medications    Prior to Admission medications   Medication Sig Start Date End Date Taking? Authorizing  Provider  Azelastine-Fluticasone 137-50 MCG/ACT SUSP USE 2 SPRAYS IN EACH NOSTRIL TWICE A DAY 05/03/20   Janith Lima, MD  dexlansoprazole (DEXILANT) 60 MG capsule Take 60 mg by mouth daily.    [provider]  dutasteride (AVODART) 0.5 MG capsule Take 1 capsule (0.5 mg total) by mouth daily. 05/19/20   Janith Lima, MD  eszopiclone (LUNESTA) 2 MG TABS tablet Take 1  tablet (2 mg total) by mouth at bedtime as needed for sleep. Take immediately before bedtime 05/19/20   Janith Lima, MD  HYDROcodone-acetaminophen [facility]) 10-325 MG tablet Take 1 tablet by mouth every 6 (six) hours as needed for moderate pain. 05/14/20   Janith Lima, MD  naloxone [facility]) nasal spray 4 mg/0.1 mL Place 1 spray into the nose once.    [provider]  Pitavastatin Calcium (LIVALO) 2 MG TABS Take 1 tablet (2 mg total) by mouth daily. 05/21/20   Janith Lima, MD    Family History    Family History  Problem Relation Age of Onset  . Stroke Mother   . Arthritis Mother   . Stroke Father   . Hypertension Father   . Cancer Neg Hx   . COPD Neg Hx   . Heart disease Neg Hx   . Kidney disease Neg Hx    He indicated that his mother is deceased. He indicated that his father is deceased. He indicated that the status of his neg hx is unknown.  Social History    Social History   Socioeconomic History  . Marital status: Divorced    Spouse name: Not on file  . Number of children: 1  . Years of education: Not on file  . Highest education level: Not on file  Occupational History    Employer: DISABLED  Tobacco Use  . Smoking status: Current Some Day Smoker    Packs/day: 0.10    Years: 30.00    Pack years: 3.00    Types: Cigarettes, Cigars  . Smokeless tobacco: Never Used  Vaping Use  . Vaping Use: Never used  Substance and Sexual Activity  . Alcohol use: No  . Drug use: Yes    Types: Marijuana    Comment: 1-2 a week  . Sexual activity: Not on file  Other Topics Concern  . Not on file  Social History Narrative   Lives with wife.   Social Determinants of Health   Financial Resource Strain: Low Risk   . Difficulty of Paying Living Expenses: Not hard at all  Food Insecurity: Not on file  Transportation Needs: Not on file  Physical Activity: Not on file  Stress: Not on file  Social Connections: Not on file  Intimate Partner Violence: Not on file      Review of Systems    General:  No chills, fever, night sweats or weight changes.  Cardiovascular:  No chest pain, dyspnea on exertion, edema, orthopnea, palpitations, paroxysmal nocturnal dyspnea. Dermatological: No rash, lesions/masses Respiratory: No cough, dyspnea Urologic: No hematuria, dysuria Abdominal:   No nausea, vomiting, diarrhea, bright red blood per rectum, melena, or hematemesis Neurologic:  No visual changes, wkns, changes in mental status. All other systems reviewed and are otherwise negative except as noted above.  Physical Exam    VS:  There were no vitals taken for this visit. , BMI There is no height or weight on file to calculate BMI. GEN: Well nourished, well developed, in no acute distress. HEENT: normal. Neck: Supple, no JVD,  carotid bruits, or masses. Cardiac: RRR, no murmurs, rubs, or gallops. No clubbing, cyanosis, edema.  Radials/DP/PT 2+ and equal bilaterally.  Respiratory:  Respirations regular and unlabored, clear to auscultation bilaterally. GI: Soft, nontender, nondistended, BS + x 4. MS: no deformity or atrophy. Skin: warm and dry, no rash. Neuro:  Strength and sensation are intact. Psych: Normal affect.  Accessory Clinical Findings    Recent Labs: 05/19/2020: ALT 19; BUN 9; Creatinine, Ser 1.03; Hemoglobin 15.7; Platelets 241.0; Potassium 3.9; Sodium 136; TSH 2.49   Recent Lipid Panel    Component Value Date/Time   CHOL 232 (H) 05/19/2020 0959   TRIG 129.0 05/19/2020 0959   HDL 52.50 05/19/2020 0959   CHOLHDL 4 05/19/2020 0959   VLDL 25.8 05/19/2020 0959   LDLCALC 154 (H) 05/19/2020 0959   LDLDIRECT 131.6 07/14/2011 0940    ECG personally reviewed by me today- *** - No acute changes  EKG 09/05/2019 normal sinus rhythm 68 bpm  sinus bradycardia 54 bpm- No acute changes  EKG 12/25/2018 Sinus bradycardia47 bpm  EKG 05/16/2018 Sinus bradycardia53 bpm  Echocardiogram 08/04/2011  Study Conclusions   - Left ventricle: The  cavity size was normal. Wall thickness  was normal. Systolic function was normal. The estimated  ejection fraction was in the range of 55% to 60%. Wall  motion was normal; there were no regional wall motion  abnormalities. Features are consistent with a pseudonormal  left ventricular filling pattern, with concomitant  abnormal relaxation and increased filling pressure (grade  2 diastolic dysfunction).  - Aortic valve: There was no stenosis.  - Mitral valve: There was mitral valve prolapse. Trivial  regurgitation.  - Left atrium: The atrium was mildly dilated.  - Right ventricle: The cavity size was normal. Systolic  function was normal.  - Tricuspid valve: Peak RV-RA gradient: 43mm Hg (S).  - Pulmonary arteries: PA peak pressure: 49mm Hg (S).  - Inferior vena cava: The vessel was normal in size; the  respirophasic diameter changes were in the normal range (=  50%); findings are consistent with normal central venous  pressure.  Impressions:   - Normal LV size and systolic function, EF 0000000. Moderate  diastolic dysfunction. Mitral valve prolapse with trivial  mitral regurgitation. RV normal size and systolic  function.  Echocardiogram 11/22/2019 IMPRESSIONS    1. Compared to report from 2013, MR is more prominent.  2. Left ventricular ejection fraction, by estimation, is 60 to 65%. The  left ventricle has normal function. The left ventricle has no regional  wall motion abnormalities. Left ventricular diastolic parameters were  normal.  3. Right ventricular systolic function is normal. The right ventricular  size is normal. There is mildly elevated pulmonary artery systolic  pressure.  4. Left atrial size was mildly dilated.  5. Right atrial size was mildly dilated.  6. . Mild aortic valve sclerosis is present, with no evidence of aortic  valve stenosis.   Assessment & Plan   1.  Diastolic heart failure- no increased DOE or activity  tolerance. Echocardiogram 11/22/2019 showed EF 60-65%, mild mitral regurgitation. Continue heart healthy low-sodium diet Maintain physical activity   2. Essential hypertension-BP today ***128/84.  Well-controlled at home Continue heart healthy low-sodium diet Maintain physical activity  3. Dyslipidemia-12/25/2018: Cholesterol 189; HDL 47.50; LDL Cholesterol 106; Triglycerides 177.0; VLDL 35.4. Monitored by PCP Continue pravastatin 2 mg daily  Followed by PCP  Cardiac murmur- no increased DOE or activity intolerance.  2/6 late systolic murmur.  Heard along left sternal border.  Echocardiogram 11/22/2019 showed mild mitral regurgitation. Continue heart healthy low-sodium diet and physical activity  Disposition: Follow-up with Dr. Percival Spanish in 1 year.   Jossie Ng. Mohamad Bruso NP-C    06/15/2020, 7:06 AM [facility] [place] [address] Office [phone] Fax [phone]  Notice: This dictation was prepared with Dragon dictation along with smaller phrase technology. Any transcriptional errors that result from this process are unintentional and may not be corrected upon review.  I spent***minutes examining this patient, reviewing medications, and using patient centered shared decision making involving her cardiac care.  Prior to her visit I spent greater than 20 minutes reviewing her past medical history,  medications, and prior cardiac tests.

## 2020-06-16 ENCOUNTER — Other Ambulatory Visit: Payer: Self-pay | Admitting: Internal Medicine

## 2020-06-16 DIAGNOSIS — M48061 Spinal stenosis, lumbar region without neurogenic claudication: Secondary | ICD-10-CM

## 2020-06-16 DIAGNOSIS — K2100 Gastro-esophageal reflux disease with esophagitis, without bleeding: Secondary | ICD-10-CM

## 2020-06-16 DIAGNOSIS — M87051 Idiopathic aseptic necrosis of right femur: Secondary | ICD-10-CM

## 2020-06-16 MED ORDER — DEXLANSOPRAZOLE 60 MG PO CPDR: 60.0000 mg | DELAYED_RELEASE_CAPSULE | Freq: Every day | ORAL | 1 refills | Status: DC

## 2020-06-16 MED ORDER — HYDROCODONE-ACETAMINOPHEN 10-325 MG PO TABS: 1.0000 | ORAL_TABLET | Freq: Four times a day (QID) | ORAL | 0 refills | Status: DC | PRN

## 2020-06-16 NOTE — Telephone Encounter (Signed)
Patient calling wanting an update on his hydrocodone refill, still hasn't received it. Please advise

## 2020-06-17 NOTE — Telephone Encounter (Signed)
Meds refilled yesterday by PCP

## 2020-06-18 ENCOUNTER — Ambulatory Visit: Payer: Medicare Other | Admitting: General Practice

## 2020-06-19 ENCOUNTER — Other Ambulatory Visit: Payer: Self-pay | Admitting: Internal Medicine

## 2020-06-19 DIAGNOSIS — I10 Essential (primary) hypertension: Secondary | ICD-10-CM

## 2020-06-19 DIAGNOSIS — I5189 Other ill-defined heart diseases: Secondary | ICD-10-CM

## 2020-06-25 ENCOUNTER — Other Ambulatory Visit: Payer: Self-pay

## 2020-06-25 ENCOUNTER — Ambulatory Visit: Payer: Medicare Other | Admitting: Pharmacist

## 2020-06-25 DIAGNOSIS — I10 Essential (primary) hypertension: Secondary | ICD-10-CM

## 2020-06-25 DIAGNOSIS — F5104 Psychophysiologic insomnia: Secondary | ICD-10-CM

## 2020-06-25 DIAGNOSIS — E785 Hyperlipidemia, unspecified: Secondary | ICD-10-CM

## 2020-06-25 DIAGNOSIS — K2100 Gastro-esophageal reflux disease with esophagitis, without bleeding: Secondary | ICD-10-CM

## 2020-06-25 DIAGNOSIS — I340 Nonrheumatic mitral (valve) insufficiency: Secondary | ICD-10-CM | POA: Insufficient documentation
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
## 2020-06-25 NOTE — Patient Instructions (Signed)
Visit Information  Phone number for Pharmacist: [phone]  Goals Addressed   None    Patient Care Plan: CCM Pharmacy Care Plan    Problem Identified: HTN, HLD, GERD, Insomnia   Priority: High    Goal: Patient-Specific Goal   Start Date: 06/25/2020  Expected End Date: 06/25/2021  This Visit's Progress: On track  Priority: High  Note:   Current Barriers:  . Unable to independently monitor therapeutic efficacy . Unable to achieve control of insomnia   Pharmacist Clinical Goal(s):  Marland Kitchen Over the next 180 days, patient will achieve adherence to monitoring guidelines and medication adherence to achieve therapeutic efficacy . achieve control of insomnia as evidenced by patient report through collaboration with PharmD and provider.   Interventions: . 1:1 collaboration with Janith Lima, MD regarding development and update of comprehensive plan of care as evidenced by provider attestation and co-signature . Inter-disciplinary care team collaboration (see longitudinal plan of care) . Comprehensive medication review performed; medication list updated in electronic medical record  Hypertension (BP goal <130/80) -controlled -Current treatment: . No medication -Medications previously tried: indapamide, Lexicographer -Denies hypotensive/hypertensive symptoms -Educated on BP goals and benefits of medications for prevention of heart attack, stroke and kidney damage; Exercise goal of 150 minutes per week; -Counseled to monitor BP at home weekly, document, and provide log at future appointments -Counseled on diet and exercise extensively  Hyperlipidemia: (LDL goal < 100) -uncontrolled -Current treatment: . Livalo 2 mg daily -Medications previously tried: atorvastatin -Educated on Cholesterol goals;  Benefits of statin for ASCVD risk reduction; -Recommended to continue current medication  GERD (Goal: manage symptoms) -controlled -Current treatment  . Dexilant 60 mg  daily -Medications previously tried: n/a  -Patient is satisfied with current regimen and denies issues -Recommended to continue current medication  Insomnia (Goal: improve sleep) -uncontrolled -Current treatment  . Eszoplicone 2 mg HS prn  -never started -Medications previously tried: alprazolam -pt is unwilling to take any medication other than alprazolam to help with sleep. Discussed interaction with opioid and safety concerns extensively. Pt did not agree to try Lunesta or melatonin  -Recommended to try Lunesta or melatonin - pt declined  Patient Goals/Self-Care Activities . Over the next 180 days, patient will:  - take medications as prescribed  Follow Up Plan: Telephone follow up appointment with care management team member scheduled for: 6 months     The patient verbalized understanding of instructions, educational materials, and care plan provided today and declined offer to receive copy of patient instructions, educational materials, and care plan.  Telephone follow up appointment with pharmacy team member scheduled for: 6 months  Charlene Brooke, PharmD, [facility] Clinical Pharmacist [facility] [phone]

## 2020-06-25 NOTE — Progress Notes (Signed)
Cardiology Office Note   Date:  06/26/2020   ID:  Jonathan [name], Jonathan [name] March 12, 1956, MRN [id number]  PCP:  Janith Lima, MD  Cardiologist:   Minus Breeding, MD Referring:  Janith Lima, MD  Chief Complaint  Patient presents with  . Heart Murmur      History of Present Illness: Jonathan [name] is a 65 y.o. male who I have not seen since 2013. He was seen by Odie Sera NP last year for evaluation of chronic diastolic HF.    He also has bradycardia.  He has had some chronic diastolic dysfunction.  He was given Lozol for blood pressure but he has not been taking it because his blood pressures been well controlled.    He says that he feels well. He exercises routinely.  The patient denies any new symptoms such as chest discomfort, neck or arm discomfort. There has been no new shortness of breath, PND or orthopnea. There have been no reported palpitations, presyncope or syncope.     Past Medical History:  Diagnosis Date  . Anxiety   . Arthritis   . Avascular necrosis of bone (HCC)    left hip   . Back pain, lumbosacral 2000  . Blind right eye   . Colon polyps 08/29/2011   Hyperplastic colon polyps  . Mitral valve prolapse     Past Surgical History:  Procedure Laterality Date  . EYE SURGERY  2007   Fractured bones repaired/blind in right eye  . HERNIA REPAIR     Right inguinal  . SPINE SURGERY    . TOTAL HIP ARTHROPLASTY Right 09/18/2014   Procedure: RIGHT TOTAL HIP ARTHROPLASTY ANTERIOR APPROACH;  Surgeon: Rod Can, MD;  Location: [facility];  Service: Orthopedics;  Laterality: Right;  . TOTAL HIP ARTHROPLASTY Left 04/06/2017   Procedure: LEFT TOTAL HIP ARTHROPLASTY ANTERIOR APPROACH;  Surgeon: Rod Can, MD;  Location: [facility];  Service: Orthopedics;  Laterality: Left;  Needs RNFA     Current Outpatient Medications  Medication Sig Dispense Refill  . Azelastine-Fluticasone 137-50 MCG/ACT SUSP USE 2 SPRAYS IN EACH NOSTRIL TWICE A DAY 69 g 1   . dexlansoprazole (DEXILANT) 60 MG capsule Take 1 capsule (60 mg total) by mouth daily. 90 capsule 1  . dutasteride (AVODART) 0.5 MG capsule Take 1 capsule (0.5 mg total) by mouth daily. 90 capsule 1  . eszopiclone (LUNESTA) 2 MG TABS tablet Take 1 tablet (2 mg total) by mouth at bedtime as needed for sleep. Take immediately before bedtime 90 tablet 1  . HYDROcodone-acetaminophen (NORCO) 10-325 MG tablet Take 1 tablet by mouth every 6 (six) hours as needed for moderate pain. 90 tablet 0  . naloxone (NARCAN) nasal spray 4 mg/0.1 mL Place 1 spray into the nose once.    . Pitavastatin Calcium (LIVALO) 2 MG TABS Take 1 tablet (2 mg total) by mouth daily. 90 tablet 1   No current facility-administered medications for this visit.    Allergies:   Cortisone and Lipitor [atorvastatin calcium]    ROS:  Please see the history of present illness.   Otherwise, review of systems are positive for none.   All other systems are reviewed and negative.    PHYSICAL EXAM: VS:  BP 122/84   Pulse 72   Ht 6' 2.5" (1.892 m)   Wt 208 lb (94.3 kg)   BMI 26.35 kg/m  , BMI Body mass index is 26.35 kg/m. GENERAL:  Well appearing NECK:  No  jugular venous distention, waveform within normal limits, carotid upstroke brisk and symmetric, no bruits, no thyromegaly LUNGS:  Clear to auscultation bilaterally CHEST:  Unremarkable HEART:  PMI not displaced or sustained,S1 and S2 within normal limits, no S3, no S4, no clicks, no rubs, 3 out of 6 apical systolic murmur radiating slightly to the axilla, somewhat late systolic, no diastolic murmurs ABD:  Flat, positive bowel sounds normal in frequency in pitch, no bruits, no rebound, no guarding, no midline pulsatile mass, no hepatomegaly, no splenomegaly EXT:  2 plus pulses throughout, no edema, no cyanosis no clubbing  EKG:  EKG is ordered today. The ekg ordered today demonstrates Sinus bradycardia, rate 72, axis within normal limits, intervals within normal limits, none     Recent Labs: 05/19/2020: ALT 19; BUN 9; Creatinine, Ser 1.03; Hemoglobin 15.7; Platelets 241.0; Potassium 3.9; Sodium 136; TSH 2.49    Lipid Panel    Component Value Date/Time   CHOL 232 (H) 05/19/2020 0959   TRIG 129.0 05/19/2020 0959   HDL 52.50 05/19/2020 0959   CHOLHDL 4 05/19/2020 0959   VLDL 25.8 05/19/2020 0959   LDLCALC 154 (H) 05/19/2020 0959   LDLDIRECT 131.6 07/14/2011 0940      Wt Readings from Last 3 Encounters:  06/26/20 208 lb (94.3 kg)  05/19/20 207 lb (93.9 kg)  04/02/20 200 lb (90.7 kg)      Other studies Reviewed: Additional studies/ records that were reviewed today include: Echo 2021. Review of the above records demonstrates:  Please see elsewhere in the note.     ASSESSMENT AND PLAN:  Diastolic heart failure :     He is euvolemic. He actually had no diastolic dysfunction on most recent echo. No change in therapy.  Bradycardia:    He tolerates this rhythm and has no symptoms. No change in therapy.  Essential hypertension:   Blood pressures well controlled. No change in therapy.  MR:    He has some mild prolapse with MR that is mild to moderate.  I will follow this clinically. I will get another echo in June.  Dyslipidemia: He was just started on statins with an LDL of 154. He is to get it rechecked in 2 months.  Tobacco use:  I talked to him about stopping smoking.     Current medicines are reviewed at length with the patient today.  The patient does not have concerns regarding medicines.  The following changes have been made:  no change  Labs/ tests ordered today include:   Orders Placed This Encounter  Procedures  . EKG 12-Lead  . ECHOCARDIOGRAM COMPLETE     Disposition:   FU with me in 1 year   Signed, Minus Breeding, MD  06/26/2020 8:29 AM    [facility]

## 2020-06-26 ENCOUNTER — Ambulatory Visit (INDEPENDENT_AMBULATORY_CARE_PROVIDER_SITE_OTHER): Payer: Medicare Other | Admitting: Cardiology

## 2020-06-26 ENCOUNTER — Encounter: Payer: Self-pay | Admitting: Cardiology

## 2020-06-26 VITALS — BP 122/84 | HR 72 | Ht 74.5 in | Wt 208.0 lb

## 2020-06-26 DIAGNOSIS — R001 Bradycardia, unspecified: Secondary | ICD-10-CM

## 2020-06-26 DIAGNOSIS — I10 Essential (primary) hypertension: Secondary | ICD-10-CM | POA: Diagnosis not present

## 2020-06-26 DIAGNOSIS — I5032 Chronic diastolic (congestive) heart failure: Secondary | ICD-10-CM

## 2020-06-26 DIAGNOSIS — I340 Nonrheumatic mitral (valve) insufficiency: Secondary | ICD-10-CM | POA: Diagnosis not present

## 2020-06-26 NOTE — Patient Instructions (Signed)
Medication Instructions:  No changes *If you need a refill on your cardiac medications before your next appointment, please call your pharmacy*  Lab Work: None ordered this visit  Testing/Procedures: Your physician has requested that you have an echocardiogram in June. Echocardiography is a painless test that uses sound waves to create images of your heart. It provides your doctor with information about the size and shape of your heart and how well your heart's chambers and valves are working. This procedure takes approximately one hour. There are no restrictions for this procedure. [address]  Follow-Up: At [facility], you and your health needs are our priority.  As part of our continuing mission to provide you with exceptional heart care, we have created designated Provider Care Teams.  These Care Teams include your primary Cardiologist (physician) and Advanced Practice Providers (APPs -  Physician Assistants and Nurse Practitioners) who all work together to provide you with the care you need, when you need it.   Your next appointment:   1 year(s) You will receive a reminder letter in the mail two months in advance. If you don't receive a letter, please call our office to schedule the follow-up appointment.  The format for your next appointment:   In Person  Provider:   Minus Breeding, MD  Other Instructions [phone]

## 2020-07-15 ENCOUNTER — Telehealth: Payer: Self-pay | Admitting: Internal Medicine

## 2020-07-15 DIAGNOSIS — M87052 Idiopathic aseptic necrosis of left femur: Secondary | ICD-10-CM

## 2020-07-15 DIAGNOSIS — M87051 Idiopathic aseptic necrosis of right femur: Secondary | ICD-10-CM

## 2020-07-15 DIAGNOSIS — M48061 Spinal stenosis, lumbar region without neurogenic claudication: Secondary | ICD-10-CM

## 2020-07-15 NOTE — Telephone Encounter (Signed)
HYDROcodone-acetaminophen (NORCO) 10-325 MG tablet [id number] - [place], [place] [address] Phone:  [phone]  Fax:  [phone]     Last seen- 12.21.22 Next apt- n/a

## 2020-07-16 MED ORDER — HYDROCODONE-ACETAMINOPHEN 10-325 MG PO TABS
1.0000 | ORAL_TABLET | Freq: Four times a day (QID) | ORAL | 0 refills | Status: DC | PRN
Start: 2020-07-16 — End: 2020-08-07

## 2020-07-16 NOTE — Telephone Encounter (Signed)
Prescription renewed.  State registry checked.  Needs office visit with Dr. [name] every 3 months.  Thanks

## 2020-07-16 NOTE — Addendum Note (Signed)
Addended by: Cassandria Anger on: 07/16/2020 07:35 AM   Modules accepted: Orders

## 2020-07-22 NOTE — Telephone Encounter (Signed)
Signing off ?

## 2020-08-05 ENCOUNTER — Telehealth: Payer: Self-pay | Admitting: Internal Medicine

## 2020-08-05 NOTE — Telephone Encounter (Signed)
1.Medication Requested: HYDROcodone-acetaminophen (NORCO) 10-325 MG tablet    2. Pharmacy (Name, Street, City): [id number] [place], [place] - [address]  3. On Med List: yes   4. Last Visit with PCP: 05-19-20  5. Next visit date with PCP: n/a    Agent: Please be advised that RX refills may take up to 3 business days. We ask that you follow-up with your pharmacy.  

## 2020-08-07 ENCOUNTER — Other Ambulatory Visit: Payer: Self-pay | Admitting: Internal Medicine

## 2020-08-07 DIAGNOSIS — M87051 Idiopathic aseptic necrosis of right femur: Secondary | ICD-10-CM

## 2020-08-07 DIAGNOSIS — M48061 Spinal stenosis, lumbar region without neurogenic claudication: Secondary | ICD-10-CM

## 2020-08-07 DIAGNOSIS — Z79891 Long term (current) use of opiate analgesic: Secondary | ICD-10-CM

## 2020-08-07 MED ORDER — NALOXONE HCL 4 MG/0.1ML NA LIQD: 1.0000 | Freq: Once | NASAL | 0 refills | Status: AC

## 2020-08-07 MED ORDER — HYDROCODONE-ACETAMINOPHEN 10-325 MG PO TABS
1.0000 | ORAL_TABLET | Freq: Four times a day (QID) | ORAL | 0 refills | Status: DC | PRN
Start: 2020-08-07 — End: 2020-09-07

## 2020-09-03 ENCOUNTER — Telehealth: Payer: Self-pay | Admitting: Pharmacist

## 2020-09-03 NOTE — Progress Notes (Signed)
A general adherence call was made to Mr. Kotarski today but the patient's phone number is no longer in service. The patient has no othet numbers available.   [place] Pharmacist Assistant [phone]  Time spent:8

## 2020-09-07 ENCOUNTER — Other Ambulatory Visit: Payer: Self-pay | Admitting: Internal Medicine

## 2020-09-07 ENCOUNTER — Telehealth: Payer: Self-pay | Admitting: Internal Medicine

## 2020-09-07 DIAGNOSIS — M87052 Idiopathic aseptic necrosis of left femur: Secondary | ICD-10-CM

## 2020-09-07 DIAGNOSIS — M48061 Spinal stenosis, lumbar region without neurogenic claudication: Secondary | ICD-10-CM

## 2020-09-07 DIAGNOSIS — M87051 Idiopathic aseptic necrosis of right femur: Secondary | ICD-10-CM

## 2020-09-07 MED ORDER — HYDROCODONE-ACETAMINOPHEN 10-325 MG PO TABS
1.0000 | ORAL_TABLET | Freq: Four times a day (QID) | ORAL | 0 refills | Status: DC | PRN
Start: 2020-09-07 — End: 2020-10-28

## 2020-09-07 NOTE — Telephone Encounter (Signed)
1.Medication Requested: HYDROcodone-acetaminophen (NORCO) 10-325 MG tablet    2. Pharmacy (Name, Street, [place]): [id number] - [place], [address]  3. On Med List: yes   4. Last Visit with PCP: 05-19-20  5. Next visit date with PCP: n/a    Agent: Please be advised that RX refills may take up to 3 business days. We ask that you follow-up with your pharmacy.

## 2020-10-02 ENCOUNTER — Telehealth: Payer: Self-pay | Admitting: Internal Medicine

## 2020-10-02 NOTE — Telephone Encounter (Signed)
1.Medication Requested: HYDROcodone-acetaminophen (NORCO) 10-325 MG tablet    2. Pharmacy (Name, Street, City): [id number] - [place], [place] - [address]  3. On Med List: yes   4. Last Visit with PCP: 05-19-20  5. Next visit date with PCP: n/a    Agent: Please be advised that RX refills may take up to 3 business days. We ask that you follow-up with your pharmacy.  

## 2020-10-02 NOTE — Telephone Encounter (Signed)
Attempted to call pt. He does not have a VM for me to leave a message.   Rx request is denied. Pt must be seen every 3 mo for a controlled substance. His last ov was 05/19/20. Refill can not be granted until he is seen in office by PCP.

## 2020-10-12 NOTE — Telephone Encounter (Signed)
Curt Bears calling, wondering if patient can get a short supply until his appointment on 06.01.22

## 2020-10-12 NOTE — Telephone Encounter (Signed)
I have informed Curt Bears of the below message. She expressed understanding.

## 2020-10-28 ENCOUNTER — Encounter: Payer: Self-pay | Admitting: Internal Medicine

## 2020-10-28 ENCOUNTER — Other Ambulatory Visit: Payer: Self-pay

## 2020-10-28 ENCOUNTER — Ambulatory Visit (INDEPENDENT_AMBULATORY_CARE_PROVIDER_SITE_OTHER): Payer: Medicare Other | Admitting: Internal Medicine

## 2020-10-28 ENCOUNTER — Other Ambulatory Visit (HOSPITAL_COMMUNITY): Payer: Medicare Other

## 2020-10-28 VITALS — BP 122/84 | HR 50 | Temp 97.6°F | Resp 16 | Ht 74.5 in | Wt 202.0 lb

## 2020-10-28 DIAGNOSIS — I10 Essential (primary) hypertension: Secondary | ICD-10-CM | POA: Diagnosis not present

## 2020-10-28 DIAGNOSIS — F17200 Nicotine dependence, unspecified, uncomplicated: Secondary | ICD-10-CM | POA: Diagnosis not present

## 2020-10-28 DIAGNOSIS — M87052 Idiopathic aseptic necrosis of left femur: Secondary | ICD-10-CM | POA: Diagnosis not present

## 2020-10-28 DIAGNOSIS — M48061 Spinal stenosis, lumbar region without neurogenic claudication: Secondary | ICD-10-CM

## 2020-10-28 DIAGNOSIS — R001 Bradycardia, unspecified: Secondary | ICD-10-CM | POA: Diagnosis not present

## 2020-10-28 DIAGNOSIS — I5032 Chronic diastolic (congestive) heart failure: Secondary | ICD-10-CM

## 2020-10-28 DIAGNOSIS — M87051 Idiopathic aseptic necrosis of right femur: Secondary | ICD-10-CM

## 2020-10-28 MED ORDER — HYDROCODONE-ACETAMINOPHEN 10-325 MG PO TABS: 1.0000 | ORAL_TABLET | Freq: Four times a day (QID) | ORAL | 0 refills | Status: DC | PRN

## 2020-10-28 NOTE — Progress Notes (Signed)
Subjective:  Patient ID: Jonathan [name], male    DOB: 1956/03/14  Age: 65 y.o. MRN: [id number]  CC: Back Pain and Osteoarthritis  This visit occurred during the SARS-CoV-2 public health emergency.  Safety protocols were in place, including screening questions prior to the visit, additional usage of staff PPE, and extensive cleaning of exam room while observing appropriate contact time as indicated for disinfecting solutions.    HPI Alexie L Bureau [name] presents for f/up -   He complains of chronic low back and hip pain.  He recently ran out of hydrocodone and said he is having trouble getting through the day, doing his ADLs, and sleeping.  He denies any recent trauma or injury.  Outpatient Medications Prior to Visit  Medication Sig Dispense Refill  . Azelastine-Fluticasone 137-50 MCG/ACT SUSP USE 2 SPRAYS IN EACH NOSTRIL TWICE A DAY 69 g 1  . dexlansoprazole (DEXILANT) 60 MG capsule Take 1 capsule (60 mg total) by mouth daily. 90 capsule 1  . dutasteride (AVODART) 0.5 MG capsule Take 1 capsule (0.5 mg total) by mouth daily. 90 capsule 1  . eszopiclone (LUNESTA) 2 MG TABS tablet Take 1 tablet (2 mg total) by mouth at bedtime as needed for sleep. Take immediately before bedtime 90 tablet 1  . Pitavastatin Calcium (LIVALO) 2 MG TABS Take 1 tablet (2 mg total) by mouth daily. 90 tablet 1  . HYDROcodone-acetaminophen (NORCO) 10-325 MG tablet Take 1 tablet by mouth every 6 (six) hours as needed for moderate pain. 90 tablet 0   No facility-administered medications prior to visit.    ROS Review of Systems  Constitutional: Negative for chills, diaphoresis, fatigue and fever.  HENT: Negative.   Eyes: Negative.   Respiratory: Negative for cough, chest tightness, shortness of breath and wheezing.   Cardiovascular: Negative for chest pain, palpitations and leg swelling.  Gastrointestinal: Negative for abdominal pain, constipation, diarrhea, nausea and vomiting.  Endocrine: Negative.    Genitourinary: Negative.  Negative for difficulty urinating.  Musculoskeletal: Positive for arthralgias and back pain. Negative for joint swelling and myalgias.  Skin: Negative.   Neurological: Negative.  Negative for dizziness, syncope, weakness, light-headedness and headaches.  Hematological: Negative for adenopathy. Does not bruise/bleed easily.  Psychiatric/Behavioral: Negative.     Objective:  BP 122/84 (BP Location: Left Arm, Patient Position: Sitting, Cuff Size: Large)   Pulse (!) 50   Temp 97.6 F (36.4 C) (Oral)   Resp 16   Ht 6' 2.5" (1.892 m)   Wt 202 lb (91.6 kg)   SpO2 97%   BMI 25.59 kg/m   BP Readings from Last 3 Encounters:  10/28/20 122/84  06/26/20 122/84  05/19/20 128/80    Wt Readings from Last 3 Encounters:  10/28/20 202 lb (91.6 kg)  06/26/20 208 lb (94.3 kg)  05/19/20 207 lb (93.9 kg)    Physical Exam Vitals reviewed.  Constitutional:      Appearance: Normal appearance.  HENT:     Nose: Nose normal.     Mouth/Throat:     Mouth: Mucous membranes are moist.  Eyes:     General: No scleral icterus.    Conjunctiva/sclera: Conjunctivae normal.  Cardiovascular:     Rate and Rhythm: Regular rhythm. Bradycardia present.     Heart sounds: Murmur heard.   Systolic murmur is present with a grade of 1/6. Gallop present. S4 sounds present.   Pulmonary:     Effort: Pulmonary effort is normal.     Breath sounds: No stridor.  No wheezing, rhonchi or rales.  Abdominal:     General: Abdomen is flat. Bowel sounds are normal. There is no distension.     Palpations: Abdomen is soft. There is no hepatomegaly, splenomegaly or mass.     Tenderness: There is no abdominal tenderness.  Musculoskeletal:        General: Normal range of motion.     Cervical back: Neck supple.     Right lower leg: No edema.     Left lower leg: No edema.  Lymphadenopathy:     Cervical: No cervical adenopathy.  Skin:    General: Skin is warm and dry.     Coloration: Skin is not  pale.  Neurological:     General: No focal deficit present.     Mental Status: He is alert and oriented to person, place, and time. Mental status is at baseline.  Psychiatric:        Mood and Affect: Mood normal.        Behavior: Behavior normal.     Lab Results  Component Value Date   WBC 3.6 (L) 05/19/2020   HGB 15.7 05/19/2020   HCT 46.0 05/19/2020   PLT 241.0 05/19/2020   GLUCOSE 102 (H) 05/19/2020   CHOL 232 (H) 05/19/2020   TRIG 129.0 05/19/2020   HDL 52.50 05/19/2020   LDLDIRECT 131.6 07/14/2011   LDLCALC 154 (H) 05/19/2020   ALT 19 05/19/2020   AST 14 05/19/2020   NA 136 05/19/2020   K 3.9 05/19/2020   CL 99 05/19/2020   CREATININE 1.03 05/19/2020   BUN 9 05/19/2020   CO2 32 05/19/2020   TSH 2.49 05/19/2020   PSA 2.21 05/19/2020   INR 0.97 09/08/2014   HGBA1C 6.0 05/19/2020    CT Head Wo Contrast  Result Date: 05/15/2018 CLINICAL DATA:  65 year old male weak and tired for 2 days. Initial encounter. EXAM: CT HEAD WITHOUT CONTRAST TECHNIQUE: Contiguous axial images were obtained from the base of the skull through the vertex without intravenous contrast. COMPARISON:  12/28/2006 head CT. FINDINGS: Brain: No intracranial hemorrhage or CT evidence of large acute infarct. Chronic microvascular changes. Anterior right frontal calvarial 1.2 cm area of bony overgrowth has changed minimally from the prior examination when this measured up to 1 cm. Minimal impression upon anteromedial right frontal lobe without surrounding vasogenic edema. Question exostosis versus en plaque meningioma. Vascular: No acute hyperdense vessel. Skull: No acute abnormality. Sinuses/Orbits: Remote right zygomatic arch, right orbital floor, right lateral orbital wall and right maxillary fracture with plate and screws right lateral supraorbital region. Visualized paranasal sinuses are clear. Other: Mastoid air cells and middle ear cavities are clear. IMPRESSION: 1. No intracranial hemorrhage or CT evidence  of large acute infarct. 2. Chronic microvascular changes. 3. Anterior right frontal calvarial 1.2 cm area of bony overgrowth has changed minimally from the prior examination when this measured up to 1 cm. Minimal impression upon anteromedial right frontal lobe without surrounding vasogenic edema. Question exostosis versus en plaque meningioma. 4. Remote right zygomatic arch, right orbital floor, right lateral orbital wall and right maxillary fractures. Plate and screws right lateral supraorbital region. Electronically Signed   By: Genia Del M.D.   On: 05/15/2018 09:03    Assessment & Plan:   Mohsen was seen today for back pain and osteoarthritis.  Diagnoses and all orders for this visit:  [place] -     Ambulatory Referral for Lung Cancer Scre  Spinal stenosis of lumbar region without neurogenic claudication -  HYDROcodone-acetaminophen (NORCO) 10-325 MG tablet; Take 1 tablet by mouth every 6 (six) hours as needed for moderate pain.  Avascular necrosis of bones of both hips (HCC) -     HYDROcodone-acetaminophen (NORCO) 10-325 MG tablet; Take 1 tablet by mouth every 6 (six) hours as needed for moderate pain.  Essential hypertension- His blood pressure is well controlled.  Chronic diastolic heart failure ([place])- He has a normal volume status  Bradycardia- He is asymptomatic with respect to this.   I am having Kewon L. Perrier [name] maintain his Azelastine-Fluticasone, dutasteride, eszopiclone, Livalo, dexlansoprazole, and HYDROcodone-acetaminophen.  Meds ordered this encounter  Medications  . HYDROcodone-acetaminophen (NORCO) 10-325 MG tablet    Sig: Take 1 tablet by mouth every 6 (six) hours as needed for moderate pain.    Dispense:  90 tablet    Refill:  0     Follow-up: Return in about 4 months (around 02/27/2021).  Scarlette Calico, MD

## 2020-10-28 NOTE — Patient Instructions (Signed)
Osteoarthritis  Osteoarthritis is a type of arthritis. It refers to joint pain or joint disease. Osteoarthritis affects tissue that covers the ends of bones in joints (cartilage). Cartilage acts as a cushion between the bones and helps them move smoothly. Osteoarthritis occurs when cartilage in the joints gets worn down. Osteoarthritis is sometimes called "wear and tear" arthritis. Osteoarthritis is the most common form of arthritis. It often occurs in older people. It is a condition that gets worse over time. The joints most often affected by this condition are in the fingers, toes, hips, knees, and spine, including the neck and lower back. What are the causes? This condition is caused by the wearing down of cartilage that covers the ends of bones. What increases the risk? The following factors may make you more likely to develop this condition:  Being age 50 or older.  Obesity.  Overuse of joints.  Past injury of a joint.  Past surgery on a joint.  Family history of osteoarthritis. What are the signs or symptoms? The main symptoms of this condition are pain, swelling, and stiffness in the joint. Other symptoms may include:  An enlarged joint.  More pain and further damage caused by small pieces of bone or cartilage that break off and float inside of the joint.  Small deposits of bone (osteophytes) that grow on the edges of the joint.  A grating or scraping feeling inside the joint when you move it.  Popping or creaking sounds when you move.  Difficulty walking or exercising.  An inability to grip items, twist your hand(s), or control the movements of your hands and fingers. How is this diagnosed? This condition may be diagnosed based on:  Your medical history.  A physical exam.  Your symptoms.  X-rays of the affected joint(s).  Blood tests to rule out other types of arthritis. How is this treated? There is no cure for this condition, but treatment can help control  pain and improve joint function. Treatment may include a combination of therapies, such as:  Pain relief techniques, such as: ? Applying heat and cold to the joint. ? Massage. ? A form of talk therapy called cognitive behavioral therapy (CBT). This therapy helps you set goals and follow up on the changes that you make.  Medicines for pain and inflammation. The medicines can be taken by mouth or applied to the skin. They include: ? NSAIDs, such as ibuprofen. ? Prescription medicines. ? Strong anti-inflammatory medicines (corticosteroids). ? Certain nutritional supplements.  A prescribed exercise program. You may work with a physical therapist.  Assistive devices, such as a brace, wrap, splint, specialized glove, or cane.  A weight control plan.  Surgery, such as: ? An osteotomy. This is done to reposition the bones and relieve pain or to remove loose pieces of bone and cartilage. ? Joint replacement surgery. You may need this surgery if you have advanced osteoarthritis. Follow these instructions at home: Activity  Rest your affected joints as told by your health care provider.  Exercise as told by your health care provider. He or she may recommend specific types of exercise, such as: ? Strengthening exercises. These are done to strengthen the muscles that support joints affected by arthritis. ? Aerobic activities. These are exercises, such as brisk walking or water aerobics, that increase your heart rate. ? Range-of-motion activities. These help your joints move more easily. ? Balance and agility exercises. Managing pain, stiffness, and swelling  If directed, apply heat to the affected area as often   as told by your health care provider. Use the heat source that your health care provider recommends, such as a moist heat pack or a heating pad. ? If you have a removable assistive device, remove it as told by your health care provider. ? Place a towel between your skin and the heat  source. If your health care provider tells you to keep the assistive device on while you apply heat, place a towel between the assistive device and the heat source. ? Leave the heat on for 20-30 minutes. ? Remove the heat if your skin turns bright red. This is especially important if you are unable to feel pain, heat, or cold. You may have a greater risk of getting burned.  If directed, put ice on the affected area. To do this: ? If you have a removable assistive device, remove it as told by your health care provider. ? Put ice in a plastic bag. ? Place a towel between your skin and the bag. If your health care provider tells you to keep the assistive device on during icing, place a towel between the assistive device and the bag. ? Leave the ice on for 20 minutes, 2-3 times a day. ? Move your fingers or toes often to reduce stiffness and swelling. ? Raise (elevate) the injured area above the level of your heart while you are sitting or lying down.      General instructions  Take over-the-counter and prescription medicines only as told by your health care provider.  Maintain a healthy weight. Follow instructions from your health care provider for weight control.  Do not use any products that contain nicotine or tobacco, such as cigarettes, e-cigarettes, and chewing tobacco. If you need help quitting, ask your health care provider.  Use assistive devices as told by your health care provider.  Keep all follow-up visits as told by your health care provider. This is important. Where to find more information  National Institute of Arthritis and Musculoskeletal and Skin Diseases: www.niams.nih.gov  National Institute on Aging: www.nia.nih.gov  American College of Rheumatology: www.rheumatology.org Contact a health care provider if:  You have redness, swelling, or a feeling of warmth in a joint that gets worse.  You have a fever along with joint or muscle aches.  You develop a  rash.  You have trouble doing your normal activities. Get help right away if:  You have pain that gets worse and is not relieved by pain medicine. Summary  Osteoarthritis is a type of arthritis that affects tissue covering the ends of bones in joints (cartilage).  This condition is caused by the wearing down of cartilage that covers the ends of bones.  The main symptom of this condition is pain, swelling, and stiffness in the joint.  There is no cure for this condition, but treatment can help control pain and improve joint function. This information is not intended to replace advice given to you by your health care provider. Make sure you discuss any questions you have with your health care provider. Document Revised: 05/13/2019 Document Reviewed: 05/13/2019 Elsevier Patient Education  2021 Elsevier Inc.  

## 2020-10-29 DIAGNOSIS — L03032 Cellulitis of left toe: Secondary | ICD-10-CM | POA: Diagnosis not present

## 2020-10-29 DIAGNOSIS — L03031 Cellulitis of right toe: Secondary | ICD-10-CM | POA: Diagnosis not present

## 2020-10-29 DIAGNOSIS — L600 Ingrowing nail: Secondary | ICD-10-CM | POA: Diagnosis not present

## 2020-10-29 DIAGNOSIS — B351 Tinea unguium: Secondary | ICD-10-CM | POA: Diagnosis not present

## 2020-11-03 ENCOUNTER — Ambulatory Visit: Payer: Medicare Other | Admitting: Cardiology

## 2020-11-06 ENCOUNTER — Other Ambulatory Visit: Payer: Self-pay

## 2020-11-06 ENCOUNTER — Ambulatory Visit (HOSPITAL_COMMUNITY): Payer: Medicare Other | Attending: Cardiology

## 2020-11-06 DIAGNOSIS — I340 Nonrheumatic mitral (valve) insufficiency: Secondary | ICD-10-CM | POA: Diagnosis not present

## 2020-11-06 LAB — ECHOCARDIOGRAM COMPLETE
Area-P 1/2: 3.39 cm2
S' Lateral: 2.4 cm

## 2020-11-12 ENCOUNTER — Ambulatory Visit: Payer: Medicare Other | Admitting: Internal Medicine

## 2020-11-12 ENCOUNTER — Telehealth: Payer: Self-pay | Admitting: Pharmacist

## 2020-11-12 NOTE — H&P (View-Only) (Signed)
Cardiology Office Note   Date:  11/13/2020   ID:  Dimitri Ped [name], DOB [birth year], MRN [id number]  PCP:  Janith Lima, MD  Cardiologist:   Minus Breeding, MD Referring:  Janith Lima, MD   Chief Complaint  Patient presents with   Mitral Regurgitation       History of Present Illness: Jonathan [name] is a 65 y.o. male who I have not seen since 2013. He was seen by Odie Sera NP last year for evaluation of chronic diastolic HF.    He also has bradycardia.  He has had some chronic diastolic dysfunction.  He was given Lozol for blood pressure but he has not been taking it because his blood pressures been well controlled.    I sent him for an echo with a myxomatous mitral valve prolapse of both leaflets.  The MR was at least moderate.  Since I last saw him he has done well.  He pushed mowed an acre of lawn yesterday. The patient denies any new symptoms such as chest discomfort, neck or arm discomfort. There has been no new shortness of breath, PND or orthopnea. There have been no reported palpitations, presyncope or syncope.    Past Medical History:  Diagnosis Date   Anxiety    Arthritis    Avascular necrosis of bone ([place])    left hip    Back pain, lumbosacral 2000   Blind right eye    Colon polyps 08/29/2011   Hyperplastic colon polyps   Mitral valve prolapse     Past Surgical History:  Procedure Laterality Date   EYE SURGERY  2007   Fractured bones repaired/blind in right eye   HERNIA REPAIR     Right inguinal   SPINE SURGERY     TOTAL HIP ARTHROPLASTY Right 09/18/2014   Procedure: RIGHT TOTAL HIP ARTHROPLASTY ANTERIOR APPROACH;  Surgeon: Rod Can, MD;  Location: [facility];  Service: Orthopedics;  Laterality: Right;   TOTAL HIP ARTHROPLASTY Left 04/06/2017   Procedure: LEFT TOTAL HIP ARTHROPLASTY ANTERIOR APPROACH;  Surgeon: Rod Can, MD;  Location: [facility];  Service: Orthopedics;  Laterality: Left;  Needs RNFA     Current Outpatient  Medications  Medication Sig Dispense Refill   Azelastine-Fluticasone 137-50 MCG/ACT SUSP USE 2 SPRAYS IN EACH NOSTRIL TWICE A DAY 69 g 1   dexlansoprazole (DEXILANT) 60 MG capsule Take 1 capsule (60 mg total) by mouth daily. 90 capsule 1   dutasteride (AVODART) 0.5 MG capsule Take 1 capsule (0.5 mg total) by mouth daily. 90 capsule 1   eszopiclone (LUNESTA) 2 MG TABS tablet Take 1 tablet (2 mg total) by mouth at bedtime as needed for sleep. Take immediately before bedtime 90 tablet 1   HYDROcodone-acetaminophen (NORCO) 10-325 MG tablet Take 1 tablet by mouth every 6 (six) hours as needed for moderate pain. 90 tablet 0   Pitavastatin Calcium (LIVALO) 2 MG TABS Take 1 tablet (2 mg total) by mouth daily. 90 tablet 1   No current facility-administered medications for this visit.    Allergies:   Cortisone and Lipitor [atorvastatin calcium]    ROS:  Please see the history of present illness.   Otherwise, review of systems are positive for none.   All other systems are reviewed and negative.    PHYSICAL EXAM: VS:  BP 122/80 (BP Location: Left Arm, Patient Position: Sitting, Cuff Size: Normal)   Pulse (!) 45   Resp 18   Ht 6\' 1"  (1.854  m)   Wt 198 lb (89.8 kg)   SpO2 98%   BMI 26.12 kg/m  , BMI Body mass index is 26.12 kg/m. GENERAL:  Well appearing NECK:  No jugular venous distention, waveform within normal limits, carotid upstroke brisk and symmetric, no bruits, no thyromegaly LUNGS:  Clear to auscultation bilaterally CHEST:  Unremarkable HEART:  PMI not displaced or sustained,S1 and S2 within normal limits, no S3, no S4, no clicks, no rubs, 3 out of 6 late systolic murmur heard at the apex and radiating slightly to the axilla, no diastolic murmurs ABD:  Flat, positive bowel sounds normal in frequency in pitch, no bruits, no rebound, no guarding, no midline pulsatile mass, no hepatomegaly, no splenomegaly EXT:  2 plus pulses throughout, no edema, no cyanosis no clubbing   EKG:  EKG is   ordered today. The ekg ordered today demonstrates Sinus bradycardia, rate 45, axis within normal limits, intervals within normal limits, none    Recent Labs: 05/19/2020: ALT 19; BUN 9; Creatinine, Ser 1.03; Hemoglobin 15.7; Platelets 241.0; Potassium 3.9; Sodium 136; TSH 2.49    Lipid Panel    Component Value Date/Time   CHOL 232 (H) 05/19/2020 0959   TRIG 129.0 05/19/2020 0959   HDL 52.50 05/19/2020 0959   CHOLHDL 4 05/19/2020 0959   VLDL 25.8 05/19/2020 0959   LDLCALC 154 (H) 05/19/2020 0959   LDLDIRECT 131.6 07/14/2011 0940      Wt Readings from Last 3 Encounters:  11/13/20 198 lb (89.8 kg)  10/28/20 202 lb (91.6 kg)  06/26/20 208 lb (94.3 kg)      Other studies Reviewed: Additional studies/ records that were reviewed today include:  Echo images reviewed.   Review of the above records demonstrates:  Please see elsewhere in the note.     ASSESSMENT AND PLAN:  Diastolic heart failure :   He seems to be euvolemic.  No change in therapy.  Bradycardia:   He has been a long-distance runner.  No change in therapy.  Essential hypertension: His blood pressure is well controlled.  He will continue on the meds as listed.  MR:   the MR was moderate at the least.  He will have a TEE I suspect we will be able to follow this.  Dyslipidemia:   I will repeat lipid profile today as he is fasting and he has been on statin now for several months.  Tobacco use: He is going to quit smoking completely.    Current medicines are reviewed at length with the patient today.  The patient does not have concerns regarding medicines.  The following changes have been made:  no change  Labs/ tests ordered today include:   Orders Placed This Encounter  Procedures   Lipid panel   EKG 12-Lead   ECHO TEE      Disposition:   FU with me in 1 year   Signed, Minus Breeding, MD  11/13/2020 8:47 AM    [place]

## 2020-11-12 NOTE — Progress Notes (Signed)
Cardiology Office Note   Date:  11/13/2020   ID:  Jonathan [name], DOB 1956/03/24, MRN [id number]  PCP:  Janith Lima, MD  Cardiologist:   Minus Breeding, MD Referring:  Janith Lima, MD   Chief Complaint  Patient presents with   Mitral Regurgitation       History of Present Illness: Jonathan [name] is a 65 y.o. male who I have not seen since 2013. He was seen by Odie Sera NP last year for evaluation of chronic diastolic HF.    He also has bradycardia.  He has had some chronic diastolic dysfunction.  He was given Lozol for blood pressure but he has not been taking it because his blood pressures been well controlled.    I sent him for an echo with a myxomatous mitral valve prolapse of both leaflets.  The MR was at least moderate.  Since I last saw him he has done well.  He pushed mowed an acre of lawn yesterday. The patient denies any new symptoms such as chest discomfort, neck or arm discomfort. There has been no new shortness of breath, PND or orthopnea. There have been no reported palpitations, presyncope or syncope.    Past Medical History:  Diagnosis Date   Anxiety    Arthritis    Avascular necrosis of bone ([place])    left hip    Back pain, lumbosacral 2000   Blind right eye    Colon polyps 08/29/2011   Hyperplastic colon polyps   Mitral valve prolapse     Past Surgical History:  Procedure Laterality Date   EYE SURGERY  2007   Fractured bones repaired/blind in right eye   HERNIA REPAIR     Right inguinal   SPINE SURGERY     TOTAL HIP ARTHROPLASTY Right 09/18/2014   Procedure: RIGHT TOTAL HIP ARTHROPLASTY ANTERIOR APPROACH;  Surgeon: Rod Can, MD;  Location: [facility];  Service: Orthopedics;  Laterality: Right;   TOTAL HIP ARTHROPLASTY Left 04/06/2017   Procedure: LEFT TOTAL HIP ARTHROPLASTY ANTERIOR APPROACH;  Surgeon: Rod Can, MD;  Location: [facility];  Service: Orthopedics;  Laterality: Left;  Needs RNFA     Current Outpatient  Medications  Medication Sig Dispense Refill   Azelastine-Fluticasone 137-50 MCG/ACT SUSP USE 2 SPRAYS IN EACH NOSTRIL TWICE A DAY 69 g 1   dexlansoprazole (DEXILANT) 60 MG capsule Take 1 capsule (60 mg total) by mouth daily. 90 capsule 1   dutasteride (AVODART) 0.5 MG capsule Take 1 capsule (0.5 mg total) by mouth daily. 90 capsule 1   eszopiclone (LUNESTA) 2 MG TABS tablet Take 1 tablet (2 mg total) by mouth at bedtime as needed for sleep. Take immediately before bedtime 90 tablet 1   HYDROcodone-acetaminophen (NORCO) 10-325 MG tablet Take 1 tablet by mouth every 6 (six) hours as needed for moderate pain. 90 tablet 0   Pitavastatin Calcium (LIVALO) 2 MG TABS Take 1 tablet (2 mg total) by mouth daily. 90 tablet 1   No current facility-administered medications for this visit.    Allergies:   Cortisone and Lipitor [atorvastatin calcium]    ROS:  Please see the history of present illness.   Otherwise, review of systems are positive for none.   All other systems are reviewed and negative.    PHYSICAL EXAM: VS:  BP 122/80 (BP Location: Left Arm, Patient Position: Sitting, Cuff Size: Normal)   Pulse (!) 45   Resp 18   Ht 6\' 1"  (1.854  m)   Wt 198 lb (89.8 kg)   SpO2 98%   BMI 26.12 kg/m  , BMI Body mass index is 26.12 kg/m. GENERAL:  Well appearing NECK:  No jugular venous distention, waveform within normal limits, carotid upstroke brisk and symmetric, no bruits, no thyromegaly LUNGS:  Clear to auscultation bilaterally CHEST:  Unremarkable HEART:  PMI not displaced or sustained,S1 and S2 within normal limits, no S3, no S4, no clicks, no rubs, 3 out of 6 late systolic murmur heard at the apex and radiating slightly to the axilla, no diastolic murmurs ABD:  Flat, positive bowel sounds normal in frequency in pitch, no bruits, no rebound, no guarding, no midline pulsatile mass, no hepatomegaly, no splenomegaly EXT:  2 plus pulses throughout, no edema, no cyanosis no clubbing   EKG:  EKG is   ordered today. The ekg ordered today demonstrates Sinus bradycardia, rate 45, axis within normal limits, intervals within normal limits, none    Recent Labs: 05/19/2020: ALT 19; BUN 9; Creatinine, Ser 1.03; Hemoglobin 15.7; Platelets 241.0; Potassium 3.9; Sodium 136; TSH 2.49    Lipid Panel    Component Value Date/Time   CHOL 232 (H) 05/19/2020 0959   TRIG 129.0 05/19/2020 0959   HDL 52.50 05/19/2020 0959   CHOLHDL 4 05/19/2020 0959   VLDL 25.8 05/19/2020 0959   LDLCALC 154 (H) 05/19/2020 0959   LDLDIRECT 131.6 07/14/2011 0940      Wt Readings from Last 3 Encounters:  11/13/20 198 lb (89.8 kg)  10/28/20 202 lb (91.6 kg)  06/26/20 208 lb (94.3 kg)      Other studies Reviewed: Additional studies/ records that were reviewed today include:  Echo images reviewed.   Review of the above records demonstrates:  Please see elsewhere in the note.     ASSESSMENT AND PLAN:  Diastolic heart failure :   He seems to be euvolemic.  No change in therapy.  Bradycardia:   He has been a long-distance runner.  No change in therapy.  Essential hypertension: His blood pressure is well controlled.  He will continue on the meds as listed.  MR:   the MR was moderate at the least.  He will have a TEE I suspect we will be able to follow this.  Dyslipidemia:   I will repeat lipid profile today as he is fasting and he has been on statin now for several months.  Tobacco use: He is going to quit smoking completely.    Current medicines are reviewed at length with the patient today.  The patient does not have concerns regarding medicines.  The following changes have been made:  no change  Labs/ tests ordered today include:   Orders Placed This Encounter  Procedures   Lipid panel   EKG 12-Lead   ECHO TEE      Disposition:   FU with me in 1 year   Signed, Minus Breeding, MD  11/13/2020 8:47 AM    [place]

## 2020-11-12 NOTE — Progress Notes (Signed)
    Chronic Care Management Pharmacy Assistant   Name: Jonathan [name] MRN: [id number] DOB: 1955-08-21  Reason for Encounter: Disease State -General Adherence  Recent office visits:  10/28/20 [name] (PCP) - Tobacco abuse. Referral to Lung Cancer Screening. F/u 4 mos.  Recent consult visits:  06/26/20 [name] (Cardiology) - CHF, heart Murmur. No med changes.  Hospital visits:  None in previous 6 months  Medications: Outpatient Encounter Medications as of 11/12/2020  Medication Sig   Azelastine-Fluticasone 137-50 MCG/ACT SUSP USE 2 SPRAYS IN EACH NOSTRIL TWICE A DAY   dexlansoprazole (DEXILANT) 60 MG capsule Take 1 capsule (60 mg total) by mouth daily.   dutasteride (AVODART) 0.5 MG capsule Take 1 capsule (0.5 mg total) by mouth daily.   eszopiclone (LUNESTA) 2 MG TABS tablet Take 1 tablet (2 mg total) by mouth at bedtime as needed for sleep. Take immediately before bedtime   HYDROcodone-acetaminophen (NORCO) 10-325 MG tablet Take 1 tablet by mouth every 6 (six) hours as needed for moderate pain.   Pitavastatin Calcium (LIVALO) 2 MG TABS Take 1 tablet (2 mg total) by mouth daily.   No facility-administered encounter medications on file as of 11/12/2020.    Have you had any problems recently with your health? Patient states no problems with health.  Have you had any problems with your pharmacy? Patient states no problems with pharmacy.   What issues or side effects are you having with your medications? Patient states no issues or side effects.   What would you like me to pass along to [facility] for them to help you with?  Patient states no concerns at this time.   What can we do to take care of you better? Patient states just keep him in our prayers.   Star Rating Drugs: Pitavastatin Calcium - last fill 08/17/20 [place], RMA Clinical Pharmacists Assistant [phone]  Time Spent: 69

## 2020-11-13 ENCOUNTER — Encounter: Payer: Self-pay | Admitting: Cardiology

## 2020-11-13 ENCOUNTER — Other Ambulatory Visit: Payer: Self-pay | Admitting: *Deleted

## 2020-11-13 ENCOUNTER — Telehealth: Payer: Self-pay | Admitting: Internal Medicine

## 2020-11-13 ENCOUNTER — Ambulatory Visit (INDEPENDENT_AMBULATORY_CARE_PROVIDER_SITE_OTHER): Payer: Medicare Other | Admitting: Cardiology

## 2020-11-13 ENCOUNTER — Other Ambulatory Visit: Payer: Self-pay

## 2020-11-13 VITALS — BP 122/80 | HR 45 | Resp 18 | Ht 73.0 in | Wt 198.0 lb

## 2020-11-13 DIAGNOSIS — E785 Hyperlipidemia, unspecified: Secondary | ICD-10-CM

## 2020-11-13 DIAGNOSIS — I5032 Chronic diastolic (congestive) heart failure: Secondary | ICD-10-CM | POA: Diagnosis not present

## 2020-11-13 DIAGNOSIS — I10 Essential (primary) hypertension: Secondary | ICD-10-CM | POA: Diagnosis not present

## 2020-11-13 DIAGNOSIS — I340 Nonrheumatic mitral (valve) insufficiency: Secondary | ICD-10-CM

## 2020-11-13 LAB — LIPID PANEL
Chol/HDL Ratio: 3.5 ratio (ref 0.0–5.0)
Cholesterol, Total: 191 mg/dL (ref 100–199)
HDL: 55 mg/dL (ref >39)
LDL Chol Calc (NIH): 118 mg/dL — ABNORMAL HIGH (ref 0–99)
Triglycerides: 97 mg/dL (ref 0–149)
VLDL Cholesterol Cal: 18 mg/dL (ref 5–40)

## 2020-11-13 NOTE — Telephone Encounter (Signed)
   Patient requesting refill for HYDROcodone-acetaminophen (NORCO) 10-325 MG tablet   Pharmacy [id number] - [place], [place].

## 2020-11-13 NOTE — Addendum Note (Signed)
Addended by: Raiford Simmonds on: 11/13/2020 09:20 AM   Modules accepted: Orders

## 2020-11-13 NOTE — Patient Instructions (Addendum)
Medication Instructions:  Your physician recommends that you continue on your current medications as directed. Please refer to the Current Medication list given to you today.  *If you need a refill on your cardiac medications before your next appointment, please call your pharmacy*   Lab Work: Lipid profile to be drawn today  If you have labs (blood work) drawn today and your tests are completely normal, you will receive your results only by: [place] (if you have MyChart) OR A paper copy in the mail If you have any lab test that is abnormal or we need to change your treatment, we will call you to review the results.   Testing/Procedures: Your physician has requested that you have a TEE. During a TEE, sound waves are used to create images of your heart. It provides your doctor with information about the size and shape of your heart and how well your heart's chambers and valves are working. In this test, a transducer is attached to the end of a flexible tube that's guided down your throat and into your esophagus (the tube leading from you mouth to your stomach) to get a more detailed image of your heart. You are not awake for the procedure. Please see the instruction sheet given to you today. For further information please visit [email or web address].      Follow-Up: At [facility], you and your health needs are our priority.  As part of our continuing mission to provide you with exceptional heart care, we have created designated Provider Care Teams.  These Care Teams include your primary Cardiologist (physician) and Advanced Practice Providers (APPs -  Physician Assistants and Nurse Practitioners) who all work together to provide you with the care you need, when you need it.  We recommend signing up for the patient portal called "MyChart".  Sign up information is provided on this After Visit Summary.  MyChart is used to connect with patients for Virtual Visits (Telemedicine).   Patients are able to view lab/test results, encounter notes, upcoming appointments, etc.  Non-urgent messages can be sent to your provider as well.   To learn more about what you can do with MyChart, go to [email or web address].    Your next appointment:   12 month(s)  The format for your next appointment:   In Person  Provider:   Minus Breeding, MD

## 2020-11-14 ENCOUNTER — Other Ambulatory Visit: Payer: Self-pay | Admitting: Internal Medicine

## 2020-11-16 ENCOUNTER — Encounter: Payer: Self-pay | Admitting: *Deleted

## 2020-11-16 NOTE — Telephone Encounter (Addendum)
-----   Message from Minus Breeding, MD sent at 11/15/2020  3:43 PM EDT ----- LDL was mildly elevated but HDL was good.  I would suggest increase the Livalo to 4 mg daily.  Repeat lipid profile in 3 months.    Left message for pt to call

## 2020-11-17 ENCOUNTER — Other Ambulatory Visit: Payer: Self-pay | Admitting: Internal Medicine

## 2020-11-18 ENCOUNTER — Other Ambulatory Visit: Payer: Self-pay | Admitting: Internal Medicine

## 2020-11-18 DIAGNOSIS — I5032 Chronic diastolic (congestive) heart failure: Secondary | ICD-10-CM | POA: Diagnosis not present

## 2020-11-18 DIAGNOSIS — I340 Nonrheumatic mitral (valve) insufficiency: Secondary | ICD-10-CM | POA: Diagnosis not present

## 2020-11-18 LAB — CBC
Hematocrit: 46.5 % (ref 37.5–51.0)
Hemoglobin: 16.1 g/dL (ref 13.0–17.7)
MCH: 28.6 pg (ref 26.6–33.0)
MCHC: 34.6 g/dL (ref 31.5–35.7)
MCV: 83 fL (ref 79–97)
Platelets: 252 10*3/uL (ref 150–450)
RBC: 5.63 x10E6/uL (ref 4.14–5.80)
RDW: 14.3 % (ref 11.6–15.4)
WBC: 3.5 10*3/uL (ref 3.4–10.8)

## 2020-11-18 LAB — BASIC METABOLIC PANEL
BUN/Creatinine Ratio: 12 (ref 10–24)
BUN: 12 mg/dL (ref 8–27)
CO2: 24 mmol/L (ref 20–29)
Calcium: 9.8 mg/dL (ref 8.6–10.2)
Chloride: 97 mmol/L (ref 96–106)
Creatinine, Ser: 1.04 mg/dL (ref 0.76–1.27)
Glucose: 97 mg/dL (ref 65–99)
Potassium: 4.8 mmol/L (ref 3.5–5.2)
Sodium: 137 mmol/L (ref 134–144)
eGFR: 79 mL/min/{1.73_m2} (ref >59)

## 2020-11-18 NOTE — Telephone Encounter (Signed)
Patient calling for status of refill  

## 2020-11-18 NOTE — Telephone Encounter (Signed)
Called pt, LVM stating too early for refill. Refill was sent on 6/1.

## 2020-11-24 DIAGNOSIS — H5213 Myopia, bilateral: Secondary | ICD-10-CM | POA: Diagnosis not present

## 2020-11-24 NOTE — Telephone Encounter (Signed)
   Patient requesting refill for HYDROcodone-acetaminophen (NORCO) 10-325 MG tablet     Pharmacy [id number] - [place], [place].

## 2020-11-25 ENCOUNTER — Ambulatory Visit (HOSPITAL_BASED_OUTPATIENT_CLINIC_OR_DEPARTMENT_OTHER): Payer: Medicare Other

## 2020-11-25 ENCOUNTER — Other Ambulatory Visit: Payer: Self-pay

## 2020-11-25 ENCOUNTER — Ambulatory Visit (HOSPITAL_COMMUNITY): Payer: Medicare Other | Admitting: Anesthesiology

## 2020-11-25 ENCOUNTER — Encounter (HOSPITAL_COMMUNITY): Payer: Self-pay | Admitting: Cardiovascular Disease

## 2020-11-25 ENCOUNTER — Encounter (HOSPITAL_COMMUNITY)
Admission: RE | Disposition: A | Discharge status: Home / Self Care | Payer: Self-pay | Source: Ambulatory Visit | Attending: Cardiovascular Disease

## 2020-11-25 ENCOUNTER — Other Ambulatory Visit: Payer: Self-pay | Admitting: Internal Medicine

## 2020-11-25 ENCOUNTER — Ambulatory Visit (HOSPITAL_COMMUNITY)
Admission: RE | Admit: 2020-11-25 | Discharge: 2020-11-25 | Disposition: A | Discharge status: Home / Self Care | Payer: Medicare Other | Source: Ambulatory Visit | Attending: Cardiovascular Disease | Admitting: Cardiovascular Disease

## 2020-11-25 DIAGNOSIS — I081 Rheumatic disorders of both mitral and tricuspid valves: Secondary | ICD-10-CM | POA: Diagnosis not present

## 2020-11-25 DIAGNOSIS — Z87891 Personal history of nicotine dependence: Secondary | ICD-10-CM | POA: Diagnosis not present

## 2020-11-25 DIAGNOSIS — M48061 Spinal stenosis, lumbar region without neurogenic claudication: Secondary | ICD-10-CM

## 2020-11-25 DIAGNOSIS — I110 Hypertensive heart disease with heart failure: Secondary | ICD-10-CM | POA: Insufficient documentation

## 2020-11-25 DIAGNOSIS — M87052 Idiopathic aseptic necrosis of left femur: Secondary | ICD-10-CM

## 2020-11-25 DIAGNOSIS — R001 Bradycardia, unspecified: Secondary | ICD-10-CM | POA: Diagnosis not present

## 2020-11-25 DIAGNOSIS — J309 Allergic rhinitis, unspecified: Secondary | ICD-10-CM | POA: Diagnosis not present

## 2020-11-25 DIAGNOSIS — I340 Nonrheumatic mitral (valve) insufficiency: Secondary | ICD-10-CM | POA: Diagnosis not present

## 2020-11-25 DIAGNOSIS — Z79899 Other long term (current) drug therapy: Secondary | ICD-10-CM | POA: Insufficient documentation

## 2020-11-25 DIAGNOSIS — I341 Nonrheumatic mitral (valve) prolapse: Secondary | ICD-10-CM | POA: Diagnosis not present

## 2020-11-25 DIAGNOSIS — I5032 Chronic diastolic (congestive) heart failure: Secondary | ICD-10-CM | POA: Insufficient documentation

## 2020-11-25 DIAGNOSIS — E785 Hyperlipidemia, unspecified: Secondary | ICD-10-CM | POA: Diagnosis not present

## 2020-11-25 HISTORY — PX: TEE WITHOUT CARDIOVERSION: SHX5443

## 2020-11-25 HISTORY — PX: BUBBLE STUDY: SHX6837

## 2020-11-25 LAB — ECHO TEE
MV M vel: 4.91 m/s
MV Peak grad: 96.2 mmHg
Radius: 1.3 cm

## 2020-11-25 SURGERY — ECHOCARDIOGRAM, TRANSESOPHAGEAL
Anesthesia: Monitor Anesthesia Care

## 2020-11-25 MED ORDER — HYDROCODONE-ACETAMINOPHEN 10-325 MG PO TABS: 1.0000 | ORAL_TABLET | Freq: Four times a day (QID) | ORAL | 0 refills | Status: DC | PRN

## 2020-11-25 MED ORDER — BUTAMBEN-TETRACAINE-BENZOCAINE 2-2-14 % EX AERO
INHALATION_SPRAY | CUTANEOUS | Status: DC | PRN
  Administered 2020-11-25: 2 via TOPICAL

## 2020-11-25 MED ORDER — PROPOFOL 10 MG/ML IV BOLUS
INTRAVENOUS | Status: DC | PRN
  Administered 2020-11-25: 30 mg via INTRAVENOUS

## 2020-11-25 MED ORDER — SODIUM CHLORIDE 0.9 % IV SOLN: INTRAVENOUS | Status: DC

## 2020-11-25 MED ORDER — SODIUM CHLORIDE 0.9 % IV SOLN: INTRAVENOUS | Status: DC | PRN

## 2020-11-25 MED ORDER — PROPOFOL 500 MG/50ML IV EMUL
INTRAVENOUS | Status: DC | PRN
  Administered 2020-11-25: 125 ug/kg/min via INTRAVENOUS

## 2020-11-25 NOTE — Discharge Instructions (Signed)
Electrical Cardioversion  Electrical cardioversion is the delivery of a jolt of electricity to restore a normal rhythm to the heart. A rhythm that is too fast or is not regular keeps the heart from pumping well. In this procedure, sticky patches or metal paddles are placed on the chest to deliver electricity to the heart from a device.  What can I expect after the procedure?  Your blood pressure, heart rate, breathing rate, and blood oxygen level will be monitored until you leave the hospital or clinic.  Your heart rhythm will be watched to make sure it does not change.  You may have some redness on the skin where the shocks were given.If this occurs, can use hydrocortisone cream or Aloe vera.  Follow these instructions at home:  Do not drive for 24 hours if you were given a sedative during your procedure.  Take over-the-counter and prescription medicines only as told by your health care provider.  Ask your health care provider how to check your pulse. Check it often.  Rest for 48 hours after the procedure or as told by your health care provider.  Avoid or limit your caffeine use as told by your health care provider.  Keep all follow-up visits as told by your health care provider. This is important.  Contact a health care provider if:  You feel like your heart is beating too quickly or your pulse is not regular.  You have a serious muscle cramp that does not go away.  Get help right away if:  You have discomfort in your chest.  You are dizzy or you feel faint.  You have trouble breathing or you are short of breath.  Your speech is slurred.  You have trouble moving an arm or leg on one side of your body.  Your fingers or toes turn cold or blue.  Summary  Electrical cardioversion is the delivery of a jolt of electricity to restore a normal rhythm to the heart.  This procedure may be done right away in an emergency or may be a scheduled procedure if the condition is not  an emergency.  Generally, this is a safe procedure.  After the procedure, check your pulse often as told by your health care provider.  This information is not intended to replace advice given to you by your health care provider. Make sure you discuss any questions you have with your health care provider. Document Revised: 12/17/2018 Document Reviewed: 12/17/2018 Elsevier Patient Education  2021 Elsevier Inc.  

## 2020-11-25 NOTE — Anesthesia Procedure Notes (Signed)
Procedure Name: MAC Date/Time: 11/25/2020 10:43 AM Performed by: Kathryne Hitch, CRNA Pre-anesthesia Checklist: Patient identified, Emergency Drugs available, Suction available and Patient being monitored Patient Re-evaluated:Patient Re-evaluated prior to induction Oxygen Delivery Method: Nasal cannula Preoxygenation: Pre-oxygenation with 100% oxygen Induction Type: IV induction Placement Confirmation: positive ETCO2 Dental Injury: Teeth and Oropharynx as per pre-operative assessment

## 2020-11-25 NOTE — Progress Notes (Signed)
Echocardiogram Echocardiogram Transesophageal has been performed.  Oneal Deputy Jibran Crookshanks RDCS 11/25/2020, 12:08 PM

## 2020-11-25 NOTE — Transfer of Care (Signed)
Immediate Anesthesia Transfer of Care Note  Patient: Jonathan [name]  Procedure(s) Performed: TRANSESOPHAGEAL ECHOCARDIOGRAM (TEE) BUBBLE STUDY  Patient Location: Endoscopy Unit  Anesthesia Type:MAC  Level of Consciousness: drowsy and patient cooperative  Airway & Oxygen Therapy: Patient Spontanous Breathing and Patient connected to nasal cannula oxygen  Post-op Assessment: Report given to RN and Post -op Vital signs reviewed and stable  Post vital signs: Reviewed and stable  Last Vitals:  Vitals Value Taken Time  BP 103/58 11/25/20 1124  Temp 36.7 C 11/25/20 1124  Pulse 50 11/25/20 1127  Resp 17 11/25/20 1127  SpO2 95 % 11/25/20 1127  Vitals shown include unvalidated device data.  Last Pain:  Vitals:   11/25/20 1124  TempSrc: Axillary  PainSc: Asleep         Complications: No notable events documented.

## 2020-11-25 NOTE — Interval H&P Note (Signed)
History and Physical Interval Note:  11/25/2020 10:26 AM  Jonathan [name]  has presented today for surgery, with the diagnosis of Edith Endave.  The various methods of treatment have been discussed with the patient and family. After consideration of risks, benefits and other options for treatment, the patient has consented to  Procedure(s): TRANSESOPHAGEAL ECHOCARDIOGRAM (TEE) (N/A) as a surgical intervention.  The patient's history has been reviewed, patient examined, no change in status, stable for surgery.  I have reviewed the patient's chart and labs.  Questions were answered to the patient's satisfaction.     TEE for MR. NPO.   [name], MD, [place]  [address], [place], [place] [address] [phone]  10:26 AM

## 2020-11-25 NOTE — CV Procedure (Signed)
    TRANSESOPHAGEAL ECHOCARDIOGRAM   NAME:  Jonathan [name]    MRN: [id number] DOB:  [birth year]    ADMIT DATE: 11/25/2020  INDICATIONS: Mitral regurgitation  PROCEDURE:   Informed consent was obtained prior to the procedure. The risks, benefits and alternatives for the procedure were discussed and the patient comprehended these risks.  Risks include, but are not limited to, cough, sore throat, vomiting, nausea, somnolence, esophageal and stomach trauma or perforation, bleeding, low blood pressure, aspiration, pneumonia, infection, trauma to the teeth and death.    Procedural time out performed. The oropharynx was anesthetized with topical 1% benzocaine.    Anesthesia was administered by Dr. Rex Kras.  The patient was administered 447 mg of propofol and 0 mg of lidocaine to achieve and maintain moderate conscious sedation.  The patient's heart rate, blood pressure, and oxygen saturation are monitored continuously during the procedure. The period of conscious sedation is 35 minutes, of which I was present face-to-face 100% of this time.   The transesophageal probe was inserted in the esophagus and stomach without difficulty and multiple views were obtained.   COMPLICATIONS:    There were no immediate complications.  KEY FINDINGS:  Severe prolapse of the P1-P2 segment. Likely P2 cleft. Systolic dominant flow in all pulmonary veins. Moderate MR LVEF 60-65%.  Full report to follow. Further management per primary team.   [name], MD, [place]  [address], [place], [place] [address] [phone]  11:18 AM

## 2020-11-25 NOTE — Anesthesia Preprocedure Evaluation (Addendum)
Anesthesia Evaluation  Patient identified by MRN, date of birth, ID band Patient awake    Reviewed: Allergy & Precautions, NPO status , Patient's Chart, lab work & pertinent test results  Airway Mallampati: II  TM Distance: >3 FB Neck ROM: Full    Dental  (+) Teeth Intact   Pulmonary neg pulmonary ROS, Current Smoker,    Pulmonary exam normal        Cardiovascular hypertension, + Valvular Problems/Murmurs MVP  Rhythm:Regular Rate:Normal + Systolic murmurs- Systolic Click    Neuro/Psych Anxiety negative neurological ROS     GI/Hepatic Neg liver ROS, GERD  Medicated,  Endo/Other  negative endocrine ROS  Renal/GU negative Renal ROS  negative genitourinary   Musculoskeletal  (+) Arthritis , Osteoarthritis,    Abdominal (+)  Abdomen: soft. Bowel sounds: normal.  Peds  Hematology negative hematology ROS (+)   Anesthesia Other Findings   Reproductive/Obstetrics                            Anesthesia Physical Anesthesia Plan  ASA: 2  Anesthesia Plan: MAC   Post-op Pain Management:    Induction: Intravenous  PONV Risk Score and Plan: 1 and Propofol infusion and Treatment may vary due to age or medical condition  Airway Management Planned: Simple Face Mask, Natural Airway and Nasal Cannula  Additional Equipment: None  Intra-op Plan:   Post-operative Plan:   Informed Consent: I have reviewed the patients History and Physical, chart, labs and discussed the procedure including the risks, benefits and alternatives for the proposed anesthesia with the patient or authorized representative who has indicated his/her understanding and acceptance.     Dental advisory given  Plan Discussed with: CRNA  Anesthesia Plan Comments: (Lab Results      Component                Value               Date                      WBC                      3.5                 11/18/2020                HGB                       16.1                11/18/2020                HCT                      46.5                11/18/2020                MCV                      83                  11/18/2020                PLT  252                 11/18/2020           Lab Results      Component                Value               Date                      NA                       137                 11/18/2020                K                        4.8                 11/18/2020                CO2                      24                  11/18/2020                GLUCOSE                  97                  11/18/2020                BUN                      12                  11/18/2020                CREATININE               1.04                11/18/2020                CALCIUM                  9.8                 11/18/2020                GFRNONAA                 >60                 05/15/2018                GFRAA                    >60                 05/15/2018          )        Anesthesia Quick Evaluation

## 2020-11-25 NOTE — Anesthesia Postprocedure Evaluation (Signed)
Anesthesia Post Note  Patient: Jonathan [name]  Procedure(s) Performed: TRANSESOPHAGEAL ECHOCARDIOGRAM (TEE) BUBBLE STUDY     Patient location during evaluation: Endoscopy Anesthesia Type: MAC Level of consciousness: awake and alert Pain management: pain level controlled Vital Signs Assessment: post-procedure vital signs reviewed and stable Respiratory status: spontaneous breathing, nonlabored ventilation, respiratory function stable and patient connected to nasal cannula oxygen Cardiovascular status: stable and blood pressure returned to baseline Postop Assessment: no apparent nausea or vomiting Anesthetic complications: no   No notable events documented.  Last Vitals:  Vitals:   11/25/20 1134 11/25/20 1144  BP: 99/67 115/72  Pulse: (!) 54 (!) 45  Resp: 19 18  Temp:    SpO2: 99% 98%    Last Pain:  Vitals:   11/25/20 1144  TempSrc:   PainSc: 0-No pain                 Belenda Cruise P Dorann Davidson

## 2020-11-26 ENCOUNTER — Encounter (HOSPITAL_COMMUNITY): Payer: Self-pay | Admitting: Cardiovascular Disease

## 2020-11-26 DIAGNOSIS — B351 Tinea unguium: Secondary | ICD-10-CM | POA: Diagnosis not present

## 2020-11-26 DIAGNOSIS — L03031 Cellulitis of right toe: Secondary | ICD-10-CM | POA: Diagnosis not present

## 2020-11-26 DIAGNOSIS — L03032 Cellulitis of left toe: Secondary | ICD-10-CM | POA: Diagnosis not present

## 2020-11-26 DIAGNOSIS — L600 Ingrowing nail: Secondary | ICD-10-CM | POA: Diagnosis not present

## 2020-11-26 NOTE — Telephone Encounter (Signed)
   [facility] , please send to [facility], [place]  Please send HYDROcodone-acetaminophen (NORCO) 10-325 MG tablet

## 2020-11-27 ENCOUNTER — Other Ambulatory Visit: Payer: Self-pay | Admitting: Internal Medicine

## 2020-11-27 DIAGNOSIS — M87052 Idiopathic aseptic necrosis of left femur: Secondary | ICD-10-CM

## 2020-11-27 DIAGNOSIS — M48061 Spinal stenosis, lumbar region without neurogenic claudication: Secondary | ICD-10-CM

## 2020-11-27 MED ORDER — HYDROCODONE-ACETAMINOPHEN 10-325 MG PO TABS: 1.0000 | ORAL_TABLET | Freq: Four times a day (QID) | ORAL | 0 refills | Status: DC | PRN

## 2020-11-30 NOTE — Progress Notes (Signed)
11/30/2020 Jonathan [name] [id number] 21-Aug-1955   Chief Complaint: Reflux follow-up  History of Present Illness: Jonathan [name] is a 65 year old male with a past medical history of anxiety, arthritis, mitral valve prolapse, diastolic CHF, colon polyps and GERD.  He presents to our office today for GERD follow-up and to renew his [place] prescription.  He endorses taking Dexilant 60 mg once daily.  He develops heartburn if he eats spicy foods or if he drinks carbonated beverages such as Pepsi.  No dysphagia or upper abdominal pain.  No lower abdominal pain.  He is passing normal formed brown bowel movement daily.  He takes aspirin 81 mg 2 tabs every other day.  Rare alcohol use. He underwent an EGD and colonoscopy by Dr. Silverio Decamp 09/02/2019 which showed a small hiatal hernia, a nonobstructing Schatzki's ring, gastric biopsies were benign without evidence of H. pylori.  The colonoscopy identified 4 small polyp tubular adenomatous/hyperplastic polyps removed from the rectum and transverse colon.  He was advised to repeat a colonoscopy in 7 years.  No other complaints at this time.  CBC Latest Ref Rng & Units 11/18/2020 05/19/2020 08/15/2019  WBC 3.4 - 10.8 x10E3/uL 3.5 3.6(L) 5.6  Hemoglobin 13.0 - 17.7 g/dL 16.1 15.7 15.5  Hematocrit 37.5 - 51.0 % 46.5 46.0 45.6  Platelets 150 - 450 x10E3/uL 252 241.0 252.0    CMP Latest Ref Rng & Units 11/18/2020 05/19/2020 08/15/2019  Glucose 65 - 99 mg/dL 97 102(H) 116(H)  BUN 8 - 27 mg/dL 12 9 16   Creatinine 0.76 - 1.27 mg/dL 1.04 1.03 1.04  Sodium 134 - 144 mmol/L 137 136 135  Potassium 3.5 - 5.2 mmol/L 4.8 3.9 4.3  Chloride 96 - 106 mmol/L 97 99 101  CO2 20 - 29 mmol/L 24 32 27  Calcium 8.6 - 10.2 mg/dL 9.8 9.6 9.7  Total Protein 6.0 - 8.3 g/dL - 7.4 7.7  Total Bilirubin 0.2 - 1.2 mg/dL - 0.6 0.4  Alkaline Phos 39 - 117 U/L - 65 77  AST 0 - 37 U/L - 14 23  ALT 0 - 53 U/L - 19 43    EGD 09/02/2019: - Z-line regular, 38 cm from the  incisors. - Non-obstructing Schatzki ring. - Gastroesophageal flap valve classified as Hill Grade IV (no fold, wide open lumen, hiatal hernia present). - Small hiatal hernia. - Erythematous mucosa in the stomach. Biopsied. - Normal examined duodenum.   Colonoscopy 09/02/2019: - Four 4 to 7 mm polyps in the rectum and in the transverse colon, removed with a cold snare. Resected and retrieved. - Moderate diverticulosis in the sigmoid colon and in the descending colon. Peri-diverticular erythema was seen. Biopsied. - Non-bleeding internal hemorrhoids. -Recall colonoscopy 7 years  Biopsy Report:  1. Surgical [P], gastric antrum and body - BENIGN GASTRIC MUCOSA. Jonathan [name] IS NEGATIVE FOR HELICOBACTER PYLORI. - NO INTESTINAL METAPLASIA, DYSPLASIA, OR MALIGNANCY. 2. Surgical [P], colon, transverse, rectal, polyp (4) - TUBULAR ADENOMA. - HYPERPLASTIC POLYP. - NO HIGH GRADE DYSPLASIA OR MALIGNANCY. 3. Surgical [P], colon, transverse - BENIGN COLONIC MUCOSA WITH FOCAL EXTRAVASATED RED BLOOD CELLS. - NO ACTIVE INFLAMMATION OR EVIDENCE OF MICROSCOPIC COLITIS. - NO DYSPLASIA OR MALIGNANCY.  Trans esophageal ECHO 11/25/2020: 1. There is severe prolapse of the P1-P2 segment. There is no flail segment. There is an eccentric anteriorly directed jet of mitral regurgitation. 2D PISA radius 1.3 cm, but 2D ERO 0.29 cm2 and R vol 39 cc after angle correction for the  eccentric jet (67 degrees). 3D VCA is also consistent with moderate mitral regurgitation 0.31 cm2. There is a second central jet in the P2 region which could represent a cleft or related to the severe prolapse of the P2 segment. The pulmonary vein flow is systolic dominant in all 4 pulmonary veins. The LA size is normal. RVSP is normal. Overall, the findings are consistent with moderate mitral regurgitation. Would recommned to follow this clinically and with serial echocardiograms. The mitral valve is myxomatous. Moderate mitral  valve regurgitation. 2. Left ventricular ejection fraction by 3D volume is 66 %. The left ventricle has normal function. 3. Right ventricular systolic function is normal. The right ventricular size is normal. 4. No left atrial/left atrial appendage thrombus was detected. The LAA emptying velocity was 61 cm/s. 5. The aortic valve is tricuspid. Aortic valve regurgitation is not visualized. No aortic stenosis is present. 6. Agitated saline contrast bubble study was negative, with no evidence of any interatrial shunt.   Current Outpatient Medications on File Prior to Visit  Medication Sig Dispense Refill   Ascorbic Acid (VITAMIN C PO) Take 1 tablet by mouth daily.     aspirin EC 81 MG tablet Take 81 mg by mouth 2 (two) times a week. Swallow whole.     Azelastine-Fluticasone 137-50 MCG/ACT SUSP USE 2 SPRAYS IN EACH NOSTRIL TWICE A DAY 69 g 1   BLACK PEPPER-TURMERIC PO Take 1 tablet by mouth daily.     Cod Liver Oil CAPS Take 1 capsule by mouth daily.     DRYSOL 20 % external solution Apply 1 application topically at bedtime.     dutasteride (AVODART) 0.5 MG capsule Take 1 capsule (0.5 mg total) by mouth daily. 90 capsule 1   HYDROcodone-acetaminophen (NORCO) 10-325 MG tablet Take 1 tablet by mouth every 6 (six) hours as needed for moderate pain. 90 tablet 0   indapamide (LOZOL) 1.25 MG tablet Take 1.25 mg by mouth daily.     Omega-3 Fatty Acids (FISH OIL PO) Take 1 capsule by mouth 2 (two) times a week.     Pitavastatin Calcium (LIVALO) 2 MG TABS Take 1 tablet (2 mg total) by mouth daily. 90 tablet 1   terbinafine (LAMISIL) 250 MG tablet Take 250 mg by mouth daily.     VITAMIN E PO Take 1 capsule by mouth daily.     naloxone (NARCAN) nasal spray 4 mg/0.1 mL Place 1 spray into the nose as needed (opioid overdose). (Patient not taking: Reported on 12/01/2020)     No current facility-administered medications on file prior to visit.   Allergies  Allergen Reactions   Cortisone Rash    "act  different"   Lipitor [Atorvastatin Calcium] Other (See Comments)    "side pain"     Current Medications, Allergies, Past Medical History, Past Surgical History, Family History and Social History were reviewed in [employer] record.   Review of Systems:   Constitutional: Negative for fever, sweats, chills or weight loss.  Respiratory: Negative for shortness of breath.   Cardiovascular: Negative for chest pain, palpitations and leg swelling.  Gastrointestinal: See HPI.  Musculoskeletal: Negative for back pain or muscle aches.  Neurological: Negative for dizziness, headaches or paresthesias.   Physical Exam: BP 120/90 (BP Location: Left Arm, Patient Position: Sitting, Cuff Size: Normal)   Pulse (!) 56   Ht 5\' 11"  (1.803 m) Comment: height measured without shoes  Wt 202 lb (91.6 kg)   BMI 28.17 kg/m   General: 65  year old male in no acute distress. Head: Normocephalic and atraumatic. Eyes: Right pupil less reactive, blind right eye. No scleral icterus. Conjunctiva pink . Ears: Normal auditory acuity. Mouth: Dentition intact. No ulcers or lesions.  Lungs: Clear throughout to auscultation. Heart: Bradycardic, MR murmur. Abdomen: Soft, nontender and nondistended. No masses or hepatomegaly. Normal bowel sounds x 4 quadrants. RUQ abdominal scar intact.  Rectal: Deferred.  Musculoskeletal: Symmetrical with no gross deformities. Extremities: No edema. Neurological: Alert oriented x 4. No focal deficits.  Psychological: Alert and cooperative. Normal mood and affect  Assessment and Recommendations:  69.  65 year old male with a history of GERD and a nonobstructing Schatzki's ring.  GERD is well controlled except when he eats spicy foods or drinks carbonated beverages. -Continue Dexilant 60 mg daily -Famotidine 20 mg 1 p.o. daily as needed for breakthrough heartburn/GERD symptoms -Follow-up in our office in 1 year and as needed -GERD diet discussed, avoid spicy  foods, carbonated beverages, known reflux triggers  -Follow-up in the office in 1 year and as needed  2.  History of colon polyps. Colonoscopy 08/2019 identified 4 small polyp tubular adenomatous/hyperplastic polyps removed from the rectum and transverse colon.  -Next colonoscopy due 08/2026

## 2020-12-01 ENCOUNTER — Ambulatory Visit (INDEPENDENT_AMBULATORY_CARE_PROVIDER_SITE_OTHER): Payer: Medicare Other | Admitting: Nurse Practitioner

## 2020-12-01 ENCOUNTER — Encounter: Payer: Self-pay | Admitting: Nurse Practitioner

## 2020-12-01 DIAGNOSIS — K2100 Gastro-esophageal reflux disease with esophagitis, without bleeding: Secondary | ICD-10-CM | POA: Diagnosis not present

## 2020-12-01 MED ORDER — DEXLANSOPRAZOLE 60 MG PO CPDR: 60.0000 mg | DELAYED_RELEASE_CAPSULE | Freq: Every day | ORAL | 3 refills | Status: DC

## 2020-12-01 NOTE — Patient Instructions (Signed)
If you are age 67 or older, your body mass index should be between 23-30. Your Body mass index is 28.17 kg/m. If this is out of the aforementioned range listed, please consider follow up with your Primary Care Provider.  The [facility] providers would like to encourage you to use [facility] to communicate with providers for non-urgent requests or questions.  Due to long hold times on the telephone, sending your provider a message by [facility] may be faster and more efficient way to get a response. Please allow 48 business hours for a response.  Please remember that this is for non-urgent requests/questions.  MEDICATION: We have sent the following medication to your pharmacy for you to pick up at your convenience: Dexilant 60 MG once a day.  OVER THE COUNTER MEDICATION Please purchase the following medications over the counter and take as directed:  Pepcid (famotidine) 20 MG once a day as needed for heartburn.  Follow up in one year or as needed.  It was great seeing you today! Thank you for entrusting me with your care and choosing [facility].  Noralyn Pick, CRNP

## 2020-12-21 ENCOUNTER — Telehealth: Payer: Self-pay

## 2020-12-21 NOTE — Telephone Encounter (Signed)
pt is asking for his HYDROcodone-acetaminophen (NORCO) 10-325 MG tablet to refilled.  **Pt [facility] [address] [facility] did not have this medication last refill request.

## 2020-12-22 DIAGNOSIS — H5203 Hypermetropia, bilateral: Secondary | ICD-10-CM | POA: Diagnosis not present

## 2020-12-23 ENCOUNTER — Ambulatory Visit (INDEPENDENT_AMBULATORY_CARE_PROVIDER_SITE_OTHER): Payer: Medicare Other | Admitting: Pharmacist

## 2020-12-23 ENCOUNTER — Other Ambulatory Visit: Payer: Self-pay | Admitting: Internal Medicine

## 2020-12-23 ENCOUNTER — Other Ambulatory Visit: Payer: Self-pay

## 2020-12-23 DIAGNOSIS — I5032 Chronic diastolic (congestive) heart failure: Secondary | ICD-10-CM | POA: Diagnosis not present

## 2020-12-23 DIAGNOSIS — E785 Hyperlipidemia, unspecified: Secondary | ICD-10-CM

## 2020-12-23 DIAGNOSIS — I10 Essential (primary) hypertension: Secondary | ICD-10-CM

## 2020-12-23 DIAGNOSIS — M48061 Spinal stenosis, lumbar region without neurogenic claudication: Secondary | ICD-10-CM

## 2020-12-23 DIAGNOSIS — I519 Heart disease, unspecified: Secondary | ICD-10-CM

## 2020-12-23 MED ORDER — PITAVASTATIN CALCIUM 4 MG PO TABS: 4.0000 mg | ORAL_TABLET | Freq: Every day | ORAL | 1 refills | Status: DC

## 2020-12-23 NOTE — Patient Instructions (Signed)
Visit Information  Phone number for Pharmacist: [phone]   Goals Addressed             This Visit's Progress    Manage My Medicine       Timeframe:  Long-Range Goal Priority:  High Start Date:      12/23/20                       Expected End Date:    12/23/21                   Follow Up Date Jan 2023   - call for medicine refill 2 or 3 days before it runs out - call if I am sick and can't take my medicine - keep a list of all the medicines I take; vitamins and herbals too  - Take up to 1500 mg of Tylenol per day in addition to hydrocodone if needed - Start Livalo 4 mg daily   Why is this important?   These steps will help you keep on track with your medicines.   Notes:         Care Plan : [place]  Updates made by Charlton Haws, [place] since 12/23/2020 12:00 AM     Problem: HTN, HLD, GERD, Insomnia   Priority: High     Goal: Disease mangement   Start Date: 06/25/2020  Expected End Date: 06/25/2021  This Visit's Progress: On track  Recent Progress: On track  Priority: High  Note:   Current Barriers:  Unable to independently monitor therapeutic efficacy Suboptimal therapeutic regimen for hyperlipidemia  Pharmacist Clinical Goal(s):  Patient will achieve adherence to monitoring guidelines and medication adherence to achieve therapeutic efficacy achieve control of HLD as evidenced by patient report through collaboration with PharmD and provider.   Interventions: 1:1 collaboration with Janith Lima, MD regarding development and update of comprehensive plan of care as evidenced by provider attestation and co-signature Inter-disciplinary care team collaboration (see longitudinal plan of care) Comprehensive medication review performed; medication list updated in electronic medical record  Hypertension (BP goal <130/80) -Controlled - BP is at goal in recent office visits; pt reports he has been taking indapamide daily -Current  treatment: Indapamide 1.25 mg daily -Medications previously tried: Lexicographer -Denies hypotensive/hypertensive symptoms -Educated on BP goals and benefits of medications for prevention of heart attack, stroke and kidney damage; -Counseled to monitor BP at home weekly, document, and provide log at future appointments -Recommend to continue current medication  Hyperlipidemia: (LDL goal < 100) -Not ideally controlled - LDL improved form 154 to 118 on Livalo (24% reduction); cardiologist advised increasing Livalo to 4 mg last month but pt never returned call -Current treatment: Livalo 2 mg daily -Medications previously tried: atorvastatin -Educated on Cholesterol goals; Benefits of statin for ASCVD risk reduction; -Counseled on recommendation to increase Livalo; pt is amenable -Plan: Increase Livalo to 4 mg. Repeat lipid panel in 3-6 months  GERD (Goal: manage symptoms) -controlled -Current treatment  Dexilant 60 mg daily Famotidine 20 mg PRN -Medications previously tried: n/a  -Patient is satisfied with current regimen and denies issues -Recommended to continue current medication  Back pain (Goal: manage symptoms) -Not ideally controlled - pt reports back/hip pain limits his mobility; he is not using any OTC medication because he was told to avoid ibuprofen -Current treatment  Hydrocodone-APAP 10-325 mg up to TID (#90/month) -Counseled on Tylenol - his Norco has Tylenol in it, he can safely take  up to 1500 mg of extra Tylenol per day if needed for breakthrough pain   Patient Goals/Self-Care Activities Patient will:  - take medications as prescribed - Take up to 1500 mg of Tylenol per day in addition to hydrocodone if needed - Start Livalo 4 mg daily      Patient verbalizes understanding of instructions provided today and agrees to view in [place].  Telephone follow up appointment with pharmacy team member scheduled for: 6 months  Charlene Brooke, PharmD, [place],  CPP Clinical Pharmacist [facility] [phone]

## 2020-12-23 NOTE — Progress Notes (Signed)
Chronic Care Management Pharmacy Note  12/23/2020 Name:  Jonathan [name] MRN:  [id number] DOB:  [birth year]  Summary: -Pt reports he has been taking indapamide and BP is at goal -Pt is amenable to increasing Livalo to 4 mg (recommendation from Dr Percival Spanish last month)  Recommendations/Changes made from today's visit: -Increase Livalo to 4 mg daily. Repeat lipid panel in 3-6 months -Advised he can use up to 1500 mg of Tylenol per day (taking APAP in Norco into account)   Subjective: Jonathan [name] is an 65 y.o. year old male who is a primary patient of Janith Lima, MD.  The CCM team was consulted for assistance with disease management and care coordination needs.    Engaged with patient by telephone for follow up visit in response to provider referral for pharmacy case management and/or care coordination services.   Consent to Services:  The patient was given information about Chronic Care Management services, agreed to services, and gave verbal consent prior to initiation of services.  Please see initial visit note for detailed documentation.   Patient Care Team: Janith Lima, MD as PCP - Cheral Bay, MD as PCP - Cardiology (Cardiology) Charlton Haws, [facility] as Pharmacist (Pharmacist)  Recent office visits: 10/28/20 [name] OV: chronic f/u; refilled hydrocodone; referred for lung cancer screening (tobacco use)  05/19/20 [name] OV: chronic f/u. Pt is living in a hotel and needs something for sleep - rx'd Lunesta. Rx'd Livalo for ASCVD risk.  10/24/19 [name] OV: HR 47 on EKG, rec'd cardiac monitor. Started indapamide (to replace Edarbyclor)  Recent consult visits: 12/01/20 NP Kennedy-Smith (GI): f/u GERD. No med changes. Dc'd lunesta (not taking)  11/13/20 Dr Percival Spanish (cardiology0: f/u MV regurg, diastolic HF, HLD. LDL 118, advised to increase Livalo to 4 mg. No other changes.  11/04/19 Dr Percival Spanish (cardiology): f/u diastolic HF and bradycardia.  Instructed to d/c indapamide due to controlled BP without it. Also d/c'd pitavastatin d/t pt not taking.   09/15/19 NP Cleaver (cardiology): continue same meds.  Recent Hospital visits: 11/25/20 admission for TEE  Objective:  Lab Results  Component Value Date   CREATININE 1.04 11/18/2020   BUN 12 11/18/2020   GFR 75.57 05/19/2020   GFRNONAA >60 05/15/2018   GFRAA >60 05/15/2018   NA 137 11/18/2020   K 4.8 11/18/2020   CALCIUM 9.8 11/18/2020   CO2 24 11/18/2020    Lab Results  Component Value Date/Time   HGBA1C 6.0 05/19/2020 09:59 AM   HGBA1C 5.2 10/13/2014 03:43 PM   GFR 75.57 05/19/2020 09:59 AM   GFR 86.46 08/15/2019 04:08 PM    Last diabetic Eye exam: No results found for: HMDIABEYEEXA  Last diabetic Foot exam: No results found for: HMDIABFOOTEX   Lab Results  Component Value Date   CHOL 191 11/13/2020   HDL 55 11/13/2020   LDLCALC 118 (H) 11/13/2020   LDLDIRECT 131.6 07/14/2011   TRIG 97 11/13/2020   CHOLHDL 3.5 11/13/2020    Hepatic Function Latest Ref Rng & Units 05/19/2020 08/15/2019 12/25/2018  Total Protein 6.0 - 8.3 g/dL 7.4 7.7 6.8  Albumin 3.5 - 5.2 g/dL 4.8 4.7 4.6  AST 0 - 37 U/L _0 ALT 0 - 53 U/L 19 43 15  Alk Phosphatase 39 - 117 U/L 65 77 61  Total Bilirubin 0.2 - 1.2 mg/dL 0.6 0.4 0.5  Bilirubin, Direct 0.0 - 0.3 mg/dL 0.1 - 0.1    Lab Results  Component  Value Date/Time   TSH 2.49 05/19/2020 09:59 AM   TSH 1.09 12/25/2018 11:09 AM    CBC Latest Ref Rng & Units 11/18/2020 05/19/2020 08/15/2019  WBC 3.4 - 10.8 x10E3/uL 3.5 3.6(L) 5.6  Hemoglobin 13.0 - 17.7 g/dL 16.1 15.7 15.5  Hematocrit 37.5 - 51.0 % 46.5 46.0 45.6  Platelets 150 - 450 x10E3/uL 252 241.0 252.0    No results found for: VD25OH  Clinical ASCVD: No  The 10-year ASCVD risk score Mikey Bussing DC Jr., et al., 2013) is: 24.2%   Values used to calculate the score:     Age: 65 years     Sex: Male     Is Non-Hispanic African American: Yes     Diabetic: No     Tobacco smoker:  Yes     Systolic Blood Pressure: 194 mmHg     Is BP treated: Yes     HDL Cholesterol: 55 mg/dL     Total Cholesterol: 191 mg/dL    BP Readings from Last 3 Encounters:  12/01/20 120/90  11/25/20 115/72  11/13/20 122/80   Pulse Readings from Last 3 Encounters:  12/01/20 (!) 56  11/25/20 (!) 45  11/13/20 (!) 45   Wt Readings from Last 3 Encounters:  12/01/20 202 lb (91.6 kg)  11/25/20 200 lb (90.7 kg)  11/13/20 198 lb (89.8 kg)   BMI Readings from Last 3 Encounters:  12/01/20 28.17 kg/m  11/25/20 26.39 kg/m  11/13/20 26.12 kg/m    Assessment/Interventions: Review of patient past medical history, allergies, medications, health status, including review of consultants reports, laboratory and other test data, was performed as part of comprehensive evaluation and provision of chronic care management services.   SDOH:  (Social Determinants of Health) assessments and interventions performed:   SDOH Screenings   Alcohol Screen: Not on file  Depression (PHQ2-9): Low Risk    PHQ-2 Score: 0  Financial Resource Strain: Low Risk    Difficulty of Paying Living Expenses: Not hard at all  Food Insecurity: Not on file  Housing: [place] Risk Score: 3  Physical Activity: Not on file  Social Connections: Not on file  Stress: Not on file  Tobacco Use: High Risk   Smoking Tobacco Use: Some Days   Smokeless Tobacco Use: Never  Transportation Needs: Not on file    [place]  Allergies  Allergen Reactions   Cortisone Rash    "act different"   Lipitor [Atorvastatin Calcium] Other (See Comments)    "side pain"    Medications Reviewed Today     Reviewed by Charlton Haws, [place] (Pharmacist) on 12/23/20 at [place] List Status: <None>   Medication Order Taking? Sig Documenting Provider Last Dose Status Informant  Ascorbic Acid (VITAMIN C PO) [id number] Yes Take 1 tablet by mouth daily. [provider] Taking Active Self  aspirin EC 81 MG tablet  [id number] Yes Take 81 mg by mouth 2 (two) times a week. Swallow whole. [provider] Taking Active Self  Azelastine-Fluticasone 137-50 MCG/ACT SUSP [id number] Yes USE 2 SPRAYS IN [facility] NOSTRIL TWICE A DAY Janith Lima, MD Taking Active Self  BLACK PEPPER-TURMERIC PO [id number] Yes Take 1 tablet by mouth daily. [provider] Taking Active Self  Cod Liver Oil CAPS [id number] Yes Take 1 capsule by mouth daily. [provider] Taking Active Self  dexlansoprazole (DEXILANT) 60 MG capsule [id number] Yes Take 1 capsule (60 mg total) by mouth daily. Noralyn Pick, NP Taking Active  DRYSOL 20 % external solution [id number] Yes Apply 1 application topically at bedtime. [provider] Taking Active Self  dutasteride (AVODART) 0.5 MG capsule [id number] Yes Take 1 capsule (0.5 mg total) by mouth daily. Janith Lima, MD Taking Active Self  HYDROcodone-acetaminophen [facility] 10-325 MG tablet [id number] Yes Take 1 tablet by mouth every 6 (six) hours as needed for moderate pain. Janith Lima, MD Taking Active   indapamide (LOZOL) 1.25 MG tablet [id number] Yes TAKE 1 TABLET (1.25 MG TOTAL) BY MOUTH DAILY. Janith Lima, MD Taking Active   naloxone [facility]) nasal spray 4 mg/0.1 mL [id number] Yes Place 1 spray into the nose as needed (opioid overdose). [provider] Taking Active            Med Note (DAVIS, SOPHIA A   Tue Dec 01, 2020  9:51 AM) On hand  Omega-3 Fatty Acids (FISH OIL PO) [id number] Yes Take 1 capsule by mouth 2 (two) times a week. [provider] Taking Active Self  Pitavastatin Calcium 4 MG TABS [id number] Yes Take 1 tablet (4 mg total) by mouth daily. Janith Lima, MD Taking Active   terbinafine (LAMISIL) 250 MG tablet [id number] Yes Take 250 mg by mouth daily. [provider] Taking Active   VITAMIN E PO [id number] Yes Take 1 capsule by mouth daily. [provider] Taking Active Self            Patient  Active Problem List   Diagnosis Date Noted   Nonrheumatic mitral valve regurgitation 06/25/2020   Encounter for general adult medical examination with abnormal findings 05/19/2020   Psychophysiological insomnia 05/19/2020   Chronic diastolic heart failure ([place]) 11/03/2019   Bradycardia 10/24/2019   Grade II diastolic dysfunction 87/86/7672   Encounter for long-term opiate analgesic use 02/21/2017   Dyslipidemia, goal LDL below 100 10/03/2016   Gastroesophageal reflux disease with esophagitis 07/23/2015   Positive test for herpes simplex virus (HSV) antibody 04/22/2015   Hyperglycemia 10/13/2014   History of colonic polyps 10/13/2014   Allergic rhinitis 06/14/2013   Essential hypertension 07/29/2011   Nonspecific abnormal electrocardiogram (ECG) (EKG) 07/14/2011   BPH with obstruction/lower urinary tract symptoms 05/05/2010   TOBACCO ABUSE 02/14/2008   Spinal stenosis of lumbar region 02/14/2008    Immunization History  Administered Date(s) Administered   Hepatitis A, Adult 04/21/2015, 11/16/2015   Influenza,inj,Quad PF,6+ Mos 06/14/2016   PFIZER(Purple Top)SARS-COV-2 Vaccination 07/14/2019, 08/06/2019, 03/07/2020   Td 05/31/2007   Tdap 09/19/2017, 02/05/2018    Conditions to be addressed/monitored: Hypertension, Hyperlipidemia, Heart Failure, and GERD   Care Plan : [place]  Updates made by Charlton Haws, [place] since 12/23/2020 12:00 AM     Problem: Hypertension, Hyperlipidemia, Heart Failure, and GERD   Priority: High     Goal: Disease mangement   Start Date: 06/25/2020  Expected End Date: 06/25/2021  This Visit's Progress: On track  Recent Progress: On track  Priority: High  Note:   Current Barriers:  Unable to independently monitor therapeutic efficacy Suboptimal therapeutic regimen for hyperlipidemia  Pharmacist Clinical Goal(s):  Patient will achieve adherence to monitoring guidelines and medication adherence to achieve therapeutic  efficacy achieve control of HLD as evidenced by patient report through collaboration with PharmD and provider.   Interventions: 1:1 collaboration with Janith Lima, MD regarding development and update of comprehensive plan of care as evidenced by provider attestation and co-signature Inter-disciplinary care team collaboration (see longitudinal plan of care) Comprehensive medication review performed; medication list updated  in electronic medical record  Hypertension (BP goal <130/80) -Controlled - BP is at goal in recent office visits; pt reports he has been taking indapamide daily -Current treatment: Indapamide 1.25 mg daily -Medications previously tried: Lexicographer -Denies hypotensive/hypertensive symptoms -Educated on BP goals and benefits of medications for prevention of heart attack, stroke and kidney damage; -Counseled to monitor BP at home weekly, document, and provide log at future appointments -Recommend to continue current medication  Hyperlipidemia: (LDL goal < 100) -Not ideally controlled - LDL improved form 154 to 118 on Livalo (24% reduction); cardiologist advised increasing Livalo to 4 mg last month but pt never returned call -Current treatment: Livalo 2 mg daily -Medications previously tried: atorvastatin -Educated on Cholesterol goals; Benefits of statin for ASCVD risk reduction; -Counseled on recommendation to increase Livalo; pt is amenable -Plan: Increase Livalo to 4 mg. Repeat lipid panel in 3-6 months  GERD (Goal: manage symptoms) -controlled -Current treatment  Dexilant 60 mg daily Famotidine 20 mg PRN -Medications previously tried: n/a  -Patient is satisfied with current regimen and denies issues -Recommended to continue current medication  Back pain (Goal: manage symptoms) -Not ideally controlled - pt reports back/hip pain limits his mobility; he is not using any OTC medication because he was told to avoid ibuprofen -Current treatment   Hydrocodone-APAP 10-325 mg up to TID (#90/month) -Counseled on Tylenol - his Norco has Tylenol in it, he can safely take up to 1500 mg of extra Tylenol per day if needed for breakthrough pain   Patient Goals/Self-Care Activities Patient will:  - take medications as prescribed - Take up to 1500 mg of Tylenol per day in addition to hydrocodone if needed - Start Livalo 4 mg daily      Medication Assistance: None required.  Patient affirms current coverage meets needs.  Compliance/Adherence/Medication fill history: Care Gaps: Shingrix Prevnar Covid booster (due 07/08/20)  Star-Rating Drugs: Livalo - LF 08/17/20  [facility]  [id number] [place] [address]Phone: [phone]: [phone] [place] [address] [place] [address]Phone: [phone]: [phone] Uses pill box? No - prefers bottles Pt endorses 100% compliance  We discussed: Current pharmacy is preferred with insurance plan and patient is satisfied with pharmacy services  Plan: Continue current medication management strategy    Follow Up:  Patient agrees to Care Plan and Follow-up.  Plan: Telephone follow up appointment with care management team member scheduled for:  6 months  LCharlene Brooke PharmD, [place] CPP Clinical Pharmacist [phone]

## 2020-12-24 DIAGNOSIS — L600 Ingrowing nail: Secondary | ICD-10-CM | POA: Diagnosis not present

## 2020-12-24 DIAGNOSIS — M79675 Pain in left toe(s): Secondary | ICD-10-CM | POA: Diagnosis not present

## 2020-12-24 DIAGNOSIS — M79674 Pain in right toe(s): Secondary | ICD-10-CM | POA: Diagnosis not present

## 2020-12-24 DIAGNOSIS — B351 Tinea unguium: Secondary | ICD-10-CM | POA: Diagnosis not present

## 2020-12-27 ENCOUNTER — Other Ambulatory Visit: Payer: Self-pay | Admitting: Internal Medicine

## 2020-12-27 DIAGNOSIS — M48061 Spinal stenosis, lumbar region without neurogenic claudication: Secondary | ICD-10-CM

## 2020-12-27 DIAGNOSIS — M87051 Idiopathic aseptic necrosis of right femur: Secondary | ICD-10-CM

## 2020-12-27 MED ORDER — HYDROCODONE-ACETAMINOPHEN 10-325 MG PO TABS: 1.0000 | ORAL_TABLET | Freq: Four times a day (QID) | ORAL | 0 refills | Status: DC | PRN

## 2020-12-28 ENCOUNTER — Ambulatory Visit (INDEPENDENT_AMBULATORY_CARE_PROVIDER_SITE_OTHER): Payer: Medicare Other | Admitting: Otolaryngology

## 2020-12-28 ENCOUNTER — Other Ambulatory Visit: Payer: Self-pay

## 2020-12-28 DIAGNOSIS — J301 Allergic rhinitis due to pollen: Secondary | ICD-10-CM

## 2020-12-28 NOTE — Progress Notes (Signed)
HPI: Jonathan [name] is a 65 y.o. male who presents is referred by his PCP for evaluation of nasal sinus complaints.  He has previously been followed by Dr. Ernesto Rutherford for allergic rhinitis.  He has previously been diagnosed with sinus infection and treated with Ceftin in the past. His main complaints today or nasal congestion.  He has not been using any nasal steroid sprays which he has several bottles of at home.  The congestion was worse after he was cutting grass.  He has not been wearing his mask when he cuts the grass. He is doing better today.  He has had no yellow-green discharge from his nose and no fever..  Past Medical History:  Diagnosis Date   Anxiety    Arthritis    Avascular necrosis of bone ([place])    left hip    Back pain, lumbosacral 2000   Blind right eye    Colon polyps 08/29/2011   Hyperplastic colon polyps   Mitral valve prolapse    Past Surgical History:  Procedure Laterality Date   BUBBLE STUDY  11/25/2020   Procedure: BUBBLE STUDY;  Surgeon: Geralynn Rile, MD;  Location: [place];  Service: Cardiovascular;;   EYE SURGERY  2007   Fractured bones repaired/blind in right eye   HERNIA REPAIR     Right inguinal   SPINE SURGERY     TEE WITHOUT CARDIOVERSION N/A 11/25/2020   Procedure: TRANSESOPHAGEAL ECHOCARDIOGRAM (TEE);  Surgeon: Geralynn Rile, MD;  Location: [place];  Service: Cardiovascular;  Laterality: N/A;   TOTAL HIP ARTHROPLASTY Right 09/18/2014   Procedure: RIGHT TOTAL HIP ARTHROPLASTY ANTERIOR APPROACH;  Surgeon: Rod Can, MD;  Location: [facility];  Service: Orthopedics;  Laterality: Right;   TOTAL HIP ARTHROPLASTY Left 04/06/2017   Procedure: LEFT TOTAL HIP ARTHROPLASTY ANTERIOR APPROACH;  Surgeon: Rod Can, MD;  Location: [facility];  Service: Orthopedics;  Laterality: Left;  Needs RNFA   Social History   Socioeconomic History   Marital status: Divorced    Spouse name: Not on file   Number of children: 1   Years of  education: Not on file   Highest education level: Not on file  Occupational History    Employer: DISABLED  Tobacco Use   Smoking status: Some Days    Packs/day: 0.10    Years: 30.00    Pack years: 3.00    Types: Cigarettes, Cigars   Smokeless tobacco: Never  Vaping Use   Vaping Use: Never used  Substance and Sexual Activity   Alcohol use: No   Drug use: Yes    Types: Marijuana    Comment: 1-2 a week   Sexual activity: Not on file  Other Topics Concern   Not on file  Social History Narrative   Lives with wife.   Social Determinants of Health   Financial Resource Strain: Not on file  Food Insecurity: Not on file  Transportation Needs: Not on file  Physical Activity: Not on file  Stress: Not on file  Social Connections: Not on file   Family History  Problem Relation Age of Onset   Stroke Mother    Arthritis Mother    Stroke Father    Hypertension Father    Cancer Neg Hx    COPD Neg Hx    Heart disease Neg Hx    Kidney disease Neg Hx    Allergies  Allergen Reactions   Cortisone Rash    "act different"   Lipitor [Atorvastatin Calcium] Other (See  Comments)    "side pain"   Prior to Admission medications   Medication Sig Start Date End Date Taking? Authorizing Provider  Ascorbic Acid (VITAMIN C PO) Take 1 tablet by mouth daily.    [provider]  aspirin EC 81 MG tablet Take 81 mg by mouth 2 (two) times a week. Swallow whole.    [provider]  Azelastine-Fluticasone 137-50 MCG/ACT SUSP USE 2 SPRAYS IN EACH NOSTRIL TWICE A DAY 05/03/20   Janith Lima, MD  BLACK PEPPER-TURMERIC PO Take 1 tablet by mouth daily.    [provider]  [facility] Liver Oil CAPS Take 1 capsule by mouth daily.    [provider]  dexlansoprazole (DEXILANT) 60 MG capsule Take 1 capsule (60 mg total) by mouth daily. 12/01/20   Noralyn Pick, NP  DRYSOL 20 % external solution Apply 1 application topically at bedtime. 10/29/20   [provider]   dutasteride (AVODART) 0.5 MG capsule Take 1 capsule (0.5 mg total) by mouth daily. 05/19/20   Janith Lima, MD  HYDROcodone-acetaminophen (NORCO) 10-325 MG tablet Take 1 tablet by mouth every 6 (six) hours as needed for moderate pain. 12/27/20   Janith Lima, MD  indapamide (LOZOL) 1.25 MG tablet TAKE 1 TABLET (1.25 MG TOTAL) BY MOUTH DAILY. 12/23/20   Janith Lima, MD  naloxone [facility]) nasal spray 4 mg/0.1 mL Place 1 spray into the nose as needed (opioid overdose).    [provider]  Omega-3 Fatty Acids (FISH OIL PO) Take 1 capsule by mouth 2 (two) times a week.    [provider]  Pitavastatin Calcium 4 MG TABS Take 1 tablet (4 mg total) by mouth daily. 12/23/20   Janith Lima, MD  terbinafine (LAMISIL) 250 MG tablet Take 250 mg by mouth daily. 11/26/20   [provider]  VITAMIN E PO Take 1 capsule by mouth daily.    [provider]     Positive ROS: Otherwise negative  All other systems have been reviewed and were otherwise negative with the exception of those mentioned in the HPI and as above.  Physical Exam: Constitutional: Alert, well-appearing, no acute distress Ears: External ears without lesions or tenderness. Ear canals are clear bilaterally with intact, clear TMs.  Nasal: External nose without lesions. Septum with only mild deformity and moderate rhinitis.  After decongesting the nose both middle meatus regions were clear with no evidence of mucopurulent discharge.  No polyps noted..  Minimal clear mucus discharge within the nasal cavity. Oral: Lips and gums without lesions. Tongue and palate mucosa without lesions. Posterior oropharynx clear. Neck: No palpable adenopathy or masses Respiratory: Breathing comfortably  Skin: No facial/neck lesions or rash noted.  Procedures  Assessment: Allergic rhinitis  Plan: Reviewed with him concerning regular use of nasal steroid spray at night as this will help a lot with the nasal sinus  congestion. I discussed with him concerning use of saline rinses during the day if he is having much mucus discharge.  This will also be beneficial after he is exposed to allergens to help clean the nose.  Also discussed with him concerning use of antihistamine such as Claritin Allegra or Zyrtec if he is going to be around allergens such as cutting grass. He will follow-up as needed.   Radene Journey, MD   CC:

## 2021-01-11 NOTE — Progress Notes (Signed)
Reviewed and agree with documentation and assessment and plan. K. Veena Carlus Stay , MD   

## 2021-01-18 ENCOUNTER — Other Ambulatory Visit: Payer: Self-pay | Admitting: Internal Medicine

## 2021-01-18 DIAGNOSIS — J301 Allergic rhinitis due to pollen: Secondary | ICD-10-CM

## 2021-01-20 NOTE — Telephone Encounter (Signed)
This encounter was created in error - please disregard.

## 2021-01-21 DIAGNOSIS — L600 Ingrowing nail: Secondary | ICD-10-CM | POA: Diagnosis not present

## 2021-01-21 DIAGNOSIS — M79674 Pain in right toe(s): Secondary | ICD-10-CM | POA: Diagnosis not present

## 2021-01-21 DIAGNOSIS — M79675 Pain in left toe(s): Secondary | ICD-10-CM | POA: Diagnosis not present

## 2021-01-21 DIAGNOSIS — B351 Tinea unguium: Secondary | ICD-10-CM | POA: Diagnosis not present

## 2021-01-22 ENCOUNTER — Telehealth: Payer: Self-pay | Admitting: Internal Medicine

## 2021-01-22 NOTE — Telephone Encounter (Signed)
1.Medication Requested: HYDROcodone-acetaminophen (NORCO) 10-325 MG tablet  2. Pharmacy (Name, Street, [id number] [place]  Phone:  [phone]:  [phone]  3. On Med List: yes  4. Last Visit with PCP: 06.16.22  5. Next visit date with PCP: 09.01.22   Agent: Please be advised that RX refills may take up to 3 business days. We ask that you follow-up with your pharmacy.

## 2021-01-25 ENCOUNTER — Other Ambulatory Visit: Payer: Self-pay | Admitting: Internal Medicine

## 2021-01-25 DIAGNOSIS — M48061 Spinal stenosis, lumbar region without neurogenic claudication: Secondary | ICD-10-CM

## 2021-01-25 DIAGNOSIS — M87052 Idiopathic aseptic necrosis of left femur: Secondary | ICD-10-CM

## 2021-01-25 MED ORDER — HYDROCODONE-ACETAMINOPHEN 10-325 MG PO TABS: 1.0000 | ORAL_TABLET | Freq: Four times a day (QID) | ORAL | 0 refills | Status: DC | PRN

## 2021-01-28 ENCOUNTER — Other Ambulatory Visit: Payer: Self-pay

## 2021-01-28 ENCOUNTER — Encounter: Payer: Self-pay | Admitting: Internal Medicine

## 2021-01-28 ENCOUNTER — Ambulatory Visit (INDEPENDENT_AMBULATORY_CARE_PROVIDER_SITE_OTHER): Payer: Medicare Other | Admitting: Internal Medicine

## 2021-01-28 VITALS — BP 136/86 | HR 63 | Temp 97.7°F | Resp 16 | Ht 71.0 in | Wt 198.0 lb

## 2021-01-28 DIAGNOSIS — N401 Enlarged prostate with lower urinary tract symptoms: Secondary | ICD-10-CM

## 2021-01-28 DIAGNOSIS — R739 Hyperglycemia, unspecified: Secondary | ICD-10-CM | POA: Diagnosis not present

## 2021-01-28 DIAGNOSIS — I519 Heart disease, unspecified: Secondary | ICD-10-CM

## 2021-01-28 DIAGNOSIS — I10 Essential (primary) hypertension: Secondary | ICD-10-CM

## 2021-01-28 DIAGNOSIS — Z79891 Long term (current) use of opiate analgesic: Secondary | ICD-10-CM

## 2021-01-28 DIAGNOSIS — N138 Other obstructive and reflux uropathy: Secondary | ICD-10-CM

## 2021-01-28 LAB — BASIC METABOLIC PANEL
BUN: 13 mg/dL (ref 6–23)
CO2: 30 mEq/L (ref 19–32)
Calcium: 10.2 mg/dL (ref 8.4–10.5)
Chloride: 99 mEq/L (ref 96–112)
Creatinine, Ser: 1 mg/dL (ref 0.40–1.50)
GFR: 77.92 mL/min (ref >60.00)
Glucose, Bld: 87 mg/dL (ref 70–99)
Potassium: 4.4 mEq/L (ref 3.5–5.1)
Sodium: 137 mEq/L (ref 135–145)

## 2021-01-28 LAB — HEMOGLOBIN A1C: Hgb A1c MFr Bld: 6.1 % (ref 4.6–6.5)

## 2021-01-28 LAB — PSA: PSA: 2.45 ng/mL (ref 0.10–4.00)

## 2021-01-28 MED ORDER — DUTASTERIDE 0.5 MG PO CAPS: 0.5000 mg | ORAL_CAPSULE | Freq: Every day | ORAL | 1 refills | Status: DC

## 2021-01-28 MED ORDER — INDAPAMIDE 1.25 MG PO TABS: ORAL_TABLET | ORAL | 0 refills | Status: DC

## 2021-01-28 NOTE — Patient Instructions (Signed)

## 2021-01-28 NOTE — Progress Notes (Signed)
Subjective:  Patient ID: Jonathan [name], male    DOB: Jan 20, 1956  Age: 65 y.o. MRN: [id number]  CC: Hypertension  This visit occurred during the SARS-CoV-2 public health emergency.  Safety protocols were in place, including screening questions prior to the visit, additional usage of staff PPE, and extensive cleaning of exam room while observing appropriate contact time as indicated for disinfecting solutions.    HPI Jonathan [name] presents for f/up -  He continues to complain of musculoskeletal pain that limits his ability to walk and do ADLs.  He is getting adequate symptom relief with hydrocodone/acetaminophen.  He tells me his blood pressure is well controlled.  He is active and denies chest pain, shortness of breath, diaphoresis, or edema.  Outpatient Medications Prior to Visit  Medication Sig Dispense Refill   Ascorbic Acid (VITAMIN C PO) Take 1 tablet by mouth daily.     aspirin EC 81 MG tablet Take 81 mg by mouth 2 (two) times a week. Swallow whole.     Azelastine-Fluticasone 137-50 MCG/ACT SUSP INSTILL 2 SPRAYS IN EACH NOSTRIL TWICE A DAY 23 g 5   BLACK PEPPER-TURMERIC PO Take 1 tablet by mouth daily.     Cod Liver Oil CAPS Take 1 capsule by mouth daily.     dexlansoprazole (DEXILANT) 60 MG capsule Take 1 capsule (60 mg total) by mouth daily. 90 capsule 3   DRYSOL 20 % external solution Apply 1 application topically at bedtime.     HYDROcodone-acetaminophen (NORCO) 10-325 MG tablet Take 1 tablet by mouth every 6 (six) hours as needed for moderate pain. 90 tablet 0   naloxone (NARCAN) nasal spray 4 mg/0.1 mL Place 1 spray into the nose as needed (opioid overdose).     Omega-3 Fatty Acids (FISH OIL PO) Take 1 capsule by mouth 2 (two) times a week.     Pitavastatin Calcium 4 MG TABS Take 1 tablet (4 mg total) by mouth daily. 90 tablet 1   terbinafine (LAMISIL) 250 MG tablet Take 250 mg by mouth daily.     VITAMIN E PO Take 1 capsule by mouth daily.     dutasteride  (AVODART) 0.5 MG capsule Take 1 capsule (0.5 mg total) by mouth daily. 90 capsule 1   indapamide (LOZOL) 1.25 MG tablet TAKE 1 TABLET (1.25 MG TOTAL) BY MOUTH DAILY. 90 tablet 0   No facility-administered medications prior to visit.    ROS Review of Systems  Constitutional:  Negative for chills, diaphoresis, fatigue and fever.  HENT: Negative.    Eyes: Negative.   Respiratory:  Negative for cough, chest tightness, shortness of breath and wheezing.   Cardiovascular:  Negative for chest pain, palpitations and leg swelling.  Gastrointestinal:  Negative for abdominal pain, constipation, diarrhea, nausea and vomiting.  Endocrine: Negative.   Genitourinary: Negative.  Negative for difficulty urinating.  Musculoskeletal:  Positive for arthralgias and back pain. Negative for myalgias and neck pain.  Skin: Negative.   Neurological:  Negative for dizziness, weakness and light-headedness.  Hematological:  Negative for adenopathy. Does not bruise/bleed easily.  Psychiatric/Behavioral: Negative.     Objective:  BP 136/86 (BP Location: Left Arm, Patient Position: Sitting, Cuff Size: Large)   Pulse 63   Temp 97.7 F (36.5 C) (Oral)   Resp 16   Ht '5\' 11"'$  (1.803 m)   Wt 198 lb (89.8 kg)   SpO2 97%   BMI 27.62 kg/m   BP Readings from Last 3 Encounters:  01/28/21 136/86  12/01/20 120/90  11/25/20 115/72    Wt Readings from Last 3 Encounters:  01/28/21 198 lb (89.8 kg)  12/01/20 202 lb (91.6 kg)  11/25/20 200 lb (90.7 kg)    Physical Exam Vitals reviewed.  HENT:     Nose: Nose normal.     Mouth/Throat:     Mouth: Mucous membranes are moist.  Eyes:     Conjunctiva/sclera: Conjunctivae normal.  Cardiovascular:     Rate and Rhythm: Normal rate and regular rhythm.     Heart sounds: Murmur heard.  Systolic murmur is present with a grade of 1/6.  No diastolic murmur is present.    No gallop.  Pulmonary:     Breath sounds: No stridor. No wheezing, rhonchi or rales.  Abdominal:      General: Abdomen is flat.     Palpations: There is no mass.     Tenderness: There is no abdominal tenderness. There is no guarding.     Hernia: No hernia is present.  Musculoskeletal:        General: Normal range of motion.     Cervical back: Neck supple.     Right lower leg: No edema.     Left lower leg: No edema.  Lymphadenopathy:     Cervical: No cervical adenopathy.  Skin:    General: Skin is warm and dry.  Neurological:     General: No focal deficit present.     Mental Status: He is alert.  Psychiatric:        Mood and Affect: Mood normal.        Behavior: Behavior normal.    Lab Results  Component Value Date   WBC 3.5 11/18/2020   HGB 16.1 11/18/2020   HCT 46.5 11/18/2020   PLT 252 11/18/2020   GLUCOSE 87 01/28/2021   CHOL 191 11/13/2020   TRIG 97 11/13/2020   HDL 55 11/13/2020   LDLDIRECT 131.6 07/14/2011   LDLCALC 118 (H) 11/13/2020   ALT 19 05/19/2020   AST 14 05/19/2020   NA 137 01/28/2021   K 4.4 01/28/2021   CL 99 01/28/2021   CREATININE 1.00 01/28/2021   BUN 13 01/28/2021   CO2 30 01/28/2021   TSH 2.49 05/19/2020   PSA 2.45 01/28/2021   INR 0.97 09/08/2014   HGBA1C 6.1 01/28/2021    ECHO TEE  Result Date: 11/25/2020    TRANSESOPHOGEAL ECHO REPORT   Patient Name:   Jonathan [name] Date of Exam: 11/25/2020 Medical Rec #:  [id number]             Height:       73.0 in Accession #:    [id number]            Weight:       200.0 lb Date of Birth:  07-11-1955             BSA:          2.152 m Patient Age:    29 years              BP:           139/94 mmHg Patient Gender: M                     HR:           78 bpm. Exam Location:  Inpatient Procedure: Transesophageal Echo, 3D Echo, Color Doppler, Cardiac Doppler and  Saline Contrast Bubble Study Indications:     I34.1 Nonrheumatic mitral (valve) prolapse  History:         Patient has prior history of Echocardiogram examinations, most                  recent 11/06/2020. Risk Factors:Dyslipidemia.   Sonographer:     Raquel Sarna Senior RDCS Referring Phys:  [place] Diagnosing Phys: Eleonore Chiquito MD PROCEDURE: After discussion of the risks and benefits of a TEE, an informed consent was obtained from the patient. TEE procedure time was 35 minutes. The transesophogeal probe was passed without difficulty through the esophogus of the patient. Local oropharyngeal anesthetic was provided with Cetacaine. Sedation performed by different physician. The patient was monitored while under deep sedation. Anesthestetic sedation was provided intravenously by Anesthesiology: '447mg'$  of Propofol. Image quality was excellent. The patient's vital signs; including heart rate, blood pressure, and oxygen saturation; remained stable throughout the procedure. The patient developed no complications during the procedure. IMPRESSIONS  1. There is severe prolapse of the P1-P2 segment. There is no flail segment. There is an eccentric anteriorly directed jet of mitral regurgitation. 2D PISA radius 1.3 cm, but 2D ERO 0.29 cm2 and R vol 39 cc after angle correction for the eccentric jet (67 degrees). 3D VCA is also consistent with moderate mitral regurgitation 0.31 cm2. There is a second central jet in the P2 region which could represent a cleft or related to the severe prolapse of the P2 segment. The pulmonary vein flow is systolic dominant in all 4 pulmonary veins. The LA size is normal. RVSP is normal. Overall, the findings are consistent with moderate mitral regurgitation. Would recommned to follow this clinically and with serial echocardiograms. The mitral valve is myxomatous. Moderate mitral valve regurgitation.  2. Left ventricular ejection fraction by 3D volume is 66 %. The left ventricle has normal function.  3. Right ventricular systolic function is normal. The right ventricular size is normal.  4. No left atrial/left atrial appendage thrombus was detected. The LAA emptying velocity was 61 cm/s.  5. The aortic valve is tricuspid.  Aortic valve regurgitation is not visualized. No aortic stenosis is present.  6. Agitated saline contrast bubble study was negative, with no evidence of any interatrial shunt. FINDINGS  Left Ventricle: Left ventricular ejection fraction by 3D volume is 66 %. The left ventricle has normal function. The left ventricular internal cavity size was normal in size. Right Ventricle: The right ventricular size is normal. No increase in right ventricular wall thickness. Right ventricular systolic function is normal. Left Atrium: Left atrial size was normal in size. No left atrial/left atrial appendage thrombus was detected. The LAA emptying velocity was 61 cm/s. Right Atrium: Right atrial size was normal in size. Pericardium: There is no evidence of pericardial effusion. Mitral Valve: There is severe prolapse of the P1-P2 segment. There is no flail segment. There is an eccentric anteriorly directed jet of mitral regurgitation. 2D PISA radius 1.3 cm, but 2D ERO 0.29 cm2 and R vol 39 cc after angle correction for the eccentric jet (67 degrees). 3D VCA is also consistent with moderate mitral regurgitation 0.31 cm2. There is a second central jet in the P2 region which could represent a cleft or related to the severe prolapse of the P2 segment. The pulmonary vein flow is systolic dominant in all 4 pulmonary veins. The LA size is normal. RVSP is normal. Overall, the findings are consistent with moderate mitral regurgitation. Would recommned to follow this  clinically and with serial echocardiograms. The mitral valve is myxomatous. Moderate mitral valve regurgitation. Tricuspid Valve: The tricuspid valve is normal in structure. Tricuspid valve regurgitation is trivial. No evidence of tricuspid stenosis. Aortic Valve: The aortic valve is tricuspid. Aortic valve regurgitation is not visualized. No aortic stenosis is present. Pulmonic Valve: The pulmonic valve was normal in structure. Pulmonic valve regurgitation is trivial. No evidence  of pulmonic stenosis. Aorta: The aortic root and ascending aorta are structurally normal, with no evidence of dilitation. There is minimal (Grade I) layered plaque involving the descending aorta. Venous: A normal flow pattern is recorded from the left upper pulmonary vein, the left lower pulmonary vein, the right upper pulmonary vein and the right lower pulmonary vein. IAS/Shunts: No atrial level shunt detected by color flow Doppler. Agitated saline contrast was given intravenously to evaluate for intracardiac shunting. Agitated saline contrast bubble study was negative, with no evidence of any interatrial shunt.  LEFT VENTRICLE PLAX 2D LVOT diam:     2.50 cm LV SV:         94 LV SV Index:   44              3D Volume EF LVOT Area:     4.91 cm        LV 3D EF:    Left                                             ventricular                                             ejection                                             fraction by                                             3D volume                                             is 66 %.                                 3D Volume EF                                LV 3D EF:    65.50 %                                LV 3D EDV:   99100.00  mm                                LV 3D ESV:   34100.00                                             mm                                LV 3D SV:    64900.00                                             mm                                 3D Volume EF:                                3D EF:        66 % AORTIC VALVE LVOT Vmax:   110.00 cm/s LVOT Vmean:  56.800 cm/s LVOT VTI:    0.191 m  AORTA Ao Root diam: 3.50 cm Ao Asc diam:  2.79 cm MR Peak grad:    96.2 mmHg   TRICUSPID VALVE MR Mean grad:    47.5 mmHg   TR Peak grad:   6.5 mmHg MR Vmax:         490.50 cm/s TR Vmax:        127.00 cm/s MR Vmean:        321.0 cm/s MR PISA:         10.62 cm   SHUNTS MR PISA Eff ROA: 83 mm      Systemic VTI:   0.19 m MR PISA Radius:  1.30 cm     Systemic Diam: 2.50 cm Eleonore Chiquito MD Electronically signed by Eleonore Chiquito MD Signature Date/Time: 11/25/2020/1:03:28 PM    Final     Assessment & Plan:   Jonathan [name] was seen today for hypertension.  Diagnoses and all orders for this visit:  Hyperglycemia- His A1c is at 6.1%.  He is prediabetic. -     Basic metabolic panel; Future -     Hemoglobin A1c; Future -     Hemoglobin A1c -     Basic metabolic panel  Essential (primary) hypertension- His blood pressure is adequately well controlled.  Electrolytes and renal unction are normal. -     indapamide (LOZOL) 1.25 MG tablet; TAKE 1 TABLET (1.25 MG TOTAL) BY MOUTH DAILY. -     Urine drugs of abuse scrn w alc, routine (Ref Lab); Future -     Basic metabolic panel; Future -     Basic metabolic panel -     Urine drugs of abuse scrn w alc, routine (Ref Lab)  Heart disease, unspecified -     indapamide (LOZOL) 1.25 MG tablet; TAKE 1 TABLET (1.25 MG TOTAL) BY MOUTH DAILY.  BPH with obstruction/lower urinary tract symptoms -     dutasteride (AVODART) 0.5 MG capsule; Take 1 capsule (0.5 mg total) by  mouth daily. -     PSA; Future -     PSA  Encounter for long-term opiate analgesic use- I will monitor for compliance and will screen for substance abuse. -     Urine drugs of abuse scrn w alc, routine (Ref Lab); Future -     Urine drugs of abuse scrn w alc, routine (Ref Lab)  I am having Jonathan [name] maintain his Drysol, naloxone, aspirin EC, Ascorbic Acid (VITAMIN C PO), Cod Liver Oil, BLACK PEPPER-TURMERIC PO, VITAMIN E PO, Omega-3 Fatty Acids (FISH OIL PO), terbinafine, dexlansoprazole, Pitavastatin Calcium, Azelastine-Fluticasone, HYDROcodone-acetaminophen, indapamide, and dutasteride.  Meds ordered this encounter  Medications   indapamide (LOZOL) 1.25 MG tablet    Sig: TAKE 1 TABLET (1.25 MG TOTAL) BY MOUTH DAILY.    Dispense:  90 tablet    Refill:  0    DX Code Needed  .   dutasteride  (AVODART) 0.5 MG capsule    Sig: Take 1 capsule (0.5 mg total) by mouth daily.    Dispense:  90 capsule    Refill:  1     Follow-up: Return in about 4 months (around 05/30/2021).  Scarlette Calico, MD

## 2021-02-03 LAB — URINE DRUGS OF ABUSE SCREEN W ALC, ROUTINE (REF LAB)

## 2021-02-19 ENCOUNTER — Telehealth: Payer: Self-pay | Admitting: Internal Medicine

## 2021-02-19 NOTE — Telephone Encounter (Signed)
1.Medication Requested: HYDROcodone-acetaminophen (NORCO) 10-325 MG tablet  2. Pharmacy (Name, Street, [place]): [id number] - [place], [address] [place] [address].  Phone:  [phone] Fax:  [phone]   3. On Med List: yes  4. Last Visit with PCP: 09.01.22  5. Next visit date with PCP: 01.03.23   Agent: Please be advised that RX refills may take up to 3 business days. We ask that you follow-up with your pharmacy.

## 2021-02-25 ENCOUNTER — Other Ambulatory Visit: Payer: Self-pay | Admitting: Internal Medicine

## 2021-02-25 DIAGNOSIS — M87051 Idiopathic aseptic necrosis of right femur: Secondary | ICD-10-CM

## 2021-02-25 DIAGNOSIS — M48061 Spinal stenosis, lumbar region without neurogenic claudication: Secondary | ICD-10-CM

## 2021-02-25 MED ORDER — HYDROCODONE-ACETAMINOPHEN 10-325 MG PO TABS: 1.0000 | ORAL_TABLET | Freq: Four times a day (QID) | ORAL | 0 refills | Status: DC | PRN

## 2021-03-01 ENCOUNTER — Telehealth: Payer: Self-pay | Admitting: Pharmacist

## 2021-03-01 NOTE — Progress Notes (Signed)
    Chronic Care Management Pharmacy Assistant   Name: Jonathan [name]  MRN: [id number] DOB: 10/07/1955   Reason for Encounter: Disease State   Conditions to be addressed/monitored: General   Recent office visits:  01/28/21 Janith Lima, MD-PCP (Hyperglycemia) orders: labs, no med changes Recent consult visits:  12/28/20 Rozetta Nunnery, MD-Otolaryngology (allergies) No med changes  Hospital visits:  None since last coordination call  Medications: Outpatient Encounter Medications as of 03/01/2021  Medication Sig Note   Ascorbic Acid (VITAMIN C PO) Take 1 tablet by mouth daily.    aspirin EC 81 MG tablet Take 81 mg by mouth 2 (two) times a week. Swallow whole.    Azelastine-Fluticasone 137-50 MCG/ACT SUSP INSTILL 2 SPRAYS IN EACH NOSTRIL TWICE A DAY    BLACK PEPPER-TURMERIC PO Take 1 tablet by mouth daily.    Cod Liver Oil CAPS Take 1 capsule by mouth daily.    dexlansoprazole (DEXILANT) 60 MG capsule Take 1 capsule (60 mg total) by mouth daily.    DRYSOL 20 % external solution Apply 1 application topically at bedtime.    dutasteride (AVODART) 0.5 MG capsule Take 1 capsule (0.5 mg total) by mouth daily.    HYDROcodone-acetaminophen (NORCO) 10-325 MG tablet Take 1 tablet by mouth every 6 (six) hours as needed for moderate pain.    indapamide (LOZOL) 1.25 MG tablet TAKE 1 TABLET (1.25 MG TOTAL) BY MOUTH DAILY.    naloxone (NARCAN) nasal spray 4 mg/0.1 mL Place 1 spray into the nose as needed (opioid overdose). 12/01/2020: On hand   Omega-3 Fatty Acids (FISH OIL PO) Take 1 capsule by mouth 2 (two) times a week.    Pitavastatin Calcium 4 MG TABS Take 1 tablet (4 mg total) by mouth daily.    terbinafine (LAMISIL) 250 MG tablet Take 250 mg by mouth daily.    VITAMIN E PO Take 1 capsule by mouth daily.    No facility-administered encounter medications on file as of 03/01/2021.    Contacted [facility] [name] on 03/01/21 for general disease state and medication adherence  call.   Patient is not > 5 days past due for refill on the following medications per chart history:  Star Medications: Medication Name/mg Last Fill Days Supply Pitavastatin 4 mg  12/24/20 90   What concerns do you have about your medications?  The patient denies side effects with his medications.   How often do you forget or accidentally miss a dose? Rarely  Do you use a pillbox? Yes  Are you having any problems getting your medications from your pharmacy? No  Has the cost of your medications been a concern? No If yes, what medication and is patient assistance available or has it been applied for?  Since last visit with CPP, the following interventions have been made:   The patient has not had an ED visit since last contact.   The patient denies problems with their health.   he denies  concerns or questions for [employer], Pharm. D at this time.     Care Gaps: Annual wellness visit in last year? No Most Recent BP reading and date:136/86 01/28/21   PCP appointment on 06/01/21    [place] Pharmacist Assistant [phone]

## 2021-03-22 ENCOUNTER — Telehealth: Payer: Self-pay | Admitting: Internal Medicine

## 2021-03-22 NOTE — Telephone Encounter (Signed)
1.Medication Requested: HYDROcodone-acetaminophen (NORCO) 10-325 MG tablet  2. Pharmacy (Name, Street, [place]): [id number] - [place], [address]  3. On Med List: yes  4. Last Visit with PCP: 01-28-2021  5. Next visit date with PCP: 06-01-2021   Agent: Please be advised that RX refills may take up to 3 business days. We ask that you follow-up with your pharmacy.

## 2021-03-24 ENCOUNTER — Other Ambulatory Visit: Payer: Self-pay | Admitting: Internal Medicine

## 2021-03-24 DIAGNOSIS — M48061 Spinal stenosis, lumbar region without neurogenic claudication: Secondary | ICD-10-CM

## 2021-03-24 DIAGNOSIS — M87051 Idiopathic aseptic necrosis of right femur: Secondary | ICD-10-CM

## 2021-03-24 MED ORDER — HYDROCODONE-ACETAMINOPHEN 10-325 MG PO TABS: 1.0000 | ORAL_TABLET | Freq: Four times a day (QID) | ORAL | 0 refills | Status: DC | PRN

## 2021-04-06 ENCOUNTER — Other Ambulatory Visit: Payer: Self-pay | Admitting: Internal Medicine

## 2021-04-06 DIAGNOSIS — Z298 Encounter for other specified prophylactic measures: Secondary | ICD-10-CM | POA: Insufficient documentation

## 2021-04-06 MED ORDER — AMOXICILLIN 500 MG PO CAPS: 2000.0000 mg | ORAL_CAPSULE | Freq: Once | ORAL | 2 refills | Status: DC

## 2021-04-20 ENCOUNTER — Telehealth: Payer: Self-pay | Admitting: Internal Medicine

## 2021-04-20 NOTE — Telephone Encounter (Signed)
1.Medication Requested: HYDROcodone-acetaminophen (NORCO) 10-325 MG tablet  2. Pharmacy (Name, Street, [place]): [id number] - [place], [place]  3. On Med List: yes   4. Last Visit with PCP: 01-28-2021  5. Next visit date with PCP: 06-01-2020   Agent: Please be advised that RX refills may take up to 3 business days. We ask that you follow-up with your pharmacy.

## 2021-04-23 ENCOUNTER — Other Ambulatory Visit: Payer: Self-pay | Admitting: Internal Medicine

## 2021-04-23 DIAGNOSIS — E785 Hyperlipidemia, unspecified: Secondary | ICD-10-CM

## 2021-04-23 DIAGNOSIS — I519 Heart disease, unspecified: Secondary | ICD-10-CM

## 2021-04-23 DIAGNOSIS — I10 Essential (primary) hypertension: Secondary | ICD-10-CM

## 2021-04-26 ENCOUNTER — Other Ambulatory Visit: Payer: Self-pay | Admitting: Internal Medicine

## 2021-04-26 DIAGNOSIS — M87051 Idiopathic aseptic necrosis of right femur: Secondary | ICD-10-CM

## 2021-04-26 DIAGNOSIS — M48061 Spinal stenosis, lumbar region without neurogenic claudication: Secondary | ICD-10-CM

## 2021-04-26 MED ORDER — HYDROCODONE-ACETAMINOPHEN 10-325 MG PO TABS: 1.0000 | ORAL_TABLET | Freq: Four times a day (QID) | ORAL | 0 refills | Status: DC | PRN

## 2021-05-03 ENCOUNTER — Telehealth: Payer: Self-pay

## 2021-05-03 NOTE — Progress Notes (Signed)
    Chronic Care Management Pharmacy Assistant   Name: Jonathan [name]  MRN: [id number] DOB: November 24, 1955   Reason for Encounter: Disease State-Adherence Call    Recent office visits:  None ID  Recent consult visits:  None ID  Hospital visits:  None since last coordination call  Medications: Outpatient Encounter Medications as of 05/03/2021  Medication Sig Note   Ascorbic Acid (VITAMIN C PO) Take 1 tablet by mouth daily.    aspirin EC 81 MG tablet Take 81 mg by mouth 2 (two) times a week. Swallow whole.    Azelastine-Fluticasone 137-50 MCG/ACT SUSP INSTILL 2 SPRAYS IN EACH NOSTRIL TWICE A DAY    BLACK PEPPER-TURMERIC PO Take 1 tablet by mouth daily.    Cod Liver Oil CAPS Take 1 capsule by mouth daily.    dexlansoprazole (DEXILANT) 60 MG capsule Take 1 capsule (60 mg total) by mouth daily.    DRYSOL 20 % external solution Apply 1 application topically at bedtime.    dutasteride (AVODART) 0.5 MG capsule Take 1 capsule (0.5 mg total) by mouth daily.    HYDROcodone-acetaminophen (NORCO) 10-325 MG tablet Take 1 tablet by mouth every 6 (six) hours as needed for moderate pain.    indapamide (LOZOL) 1.25 MG tablet TAKE 1 TABLET BY MOUTH DAILY.    LIVALO 4 MG TABS TAKE 1 TABLET BY MOUTH EVERY DAY    naloxone (NARCAN) nasal spray 4 mg/0.1 mL Place 1 spray into the nose as needed (opioid overdose). 12/01/2020: On hand   Omega-3 Fatty Acids (FISH OIL PO) Take 1 capsule by mouth 2 (two) times a week.    terbinafine (LAMISIL) 250 MG tablet Take 250 mg by mouth daily.    VITAMIN E PO Take 1 capsule by mouth daily.    No facility-administered encounter medications on file as of 05/03/2021.   Have you had any problems recently with your health?Patient states that he is doing well and is not having any new health issues. He stated that he does still take Livalo at this tim, nothing has changed  Have you had any problems with your pharmacy?Patient states that he is not having any problems with  getting or the cost of medications  What issues or side effects are you having with your medications?Patient states he has not side effects from medications  What would you like me to pass along to [facility] for them to help you with? Patient states he is doing good right now and has no complaints  What can we do to take care of you better? Patient states nothing at this time  Care Gaps: Colonoscopy-NA Diabetic Foot Exam-NA Ophthalmology-NA Dexa Scan - NA Annual Well Visit - NA Micro albumin-NA Hemoglobin A1c- 01/28/21  Star Rating Drugs: None ID  Ethelene Hal Clinical Pharmacist Assistant [phone]

## 2021-05-19 ENCOUNTER — Telehealth: Payer: Self-pay | Admitting: Internal Medicine

## 2021-05-19 NOTE — Telephone Encounter (Signed)
1.Medication Requested: HYDROcodone-acetaminophen (NORCO) 10-325 MG tablet  2. Pharmacy (Name, Street, [place]): [id number] - [place], [address]  3. On Med List: yes   4. Last Visit with PCP: 01-28-2021  5. Next visit date with PCP: 06-01-2021   Agent: Please be advised that RX refills may take up to 3 business days. We ask that you follow-up with your pharmacy.

## 2021-05-25 ENCOUNTER — Other Ambulatory Visit: Payer: Self-pay | Admitting: Internal Medicine

## 2021-05-25 DIAGNOSIS — M48061 Spinal stenosis, lumbar region without neurogenic claudication: Secondary | ICD-10-CM

## 2021-05-25 DIAGNOSIS — M87051 Idiopathic aseptic necrosis of right femur: Secondary | ICD-10-CM

## 2021-05-25 MED ORDER — HYDROCODONE-ACETAMINOPHEN 10-325 MG PO TABS: 1.0000 | ORAL_TABLET | Freq: Four times a day (QID) | ORAL | 0 refills | Status: DC | PRN

## 2021-06-01 ENCOUNTER — Other Ambulatory Visit: Payer: Self-pay

## 2021-06-01 ENCOUNTER — Ambulatory Visit (INDEPENDENT_AMBULATORY_CARE_PROVIDER_SITE_OTHER): Payer: Medicare Other | Admitting: Internal Medicine

## 2021-06-01 ENCOUNTER — Encounter: Payer: Self-pay | Admitting: Internal Medicine

## 2021-06-01 VITALS — BP 136/78 | HR 67 | Temp 97.7°F | Resp 16 | Ht 71.0 in | Wt 206.0 lb

## 2021-06-01 DIAGNOSIS — I10 Essential (primary) hypertension: Secondary | ICD-10-CM | POA: Diagnosis not present

## 2021-06-01 DIAGNOSIS — I340 Nonrheumatic mitral (valve) insufficiency: Secondary | ICD-10-CM | POA: Diagnosis not present

## 2021-06-01 DIAGNOSIS — Z298 Encounter for other specified prophylactic measures: Secondary | ICD-10-CM

## 2021-06-01 DIAGNOSIS — Z0001 Encounter for general adult medical examination with abnormal findings: Secondary | ICD-10-CM

## 2021-06-01 DIAGNOSIS — Z2989 Encounter for other specified prophylactic measures: Secondary | ICD-10-CM

## 2021-06-01 DIAGNOSIS — I5032 Chronic diastolic (congestive) heart failure: Secondary | ICD-10-CM

## 2021-06-01 DIAGNOSIS — Z23 Encounter for immunization: Secondary | ICD-10-CM

## 2021-06-01 MED ORDER — AMOXICILLIN 500 MG PO CAPS: 2000.0000 mg | ORAL_CAPSULE | Freq: Once | ORAL | 2 refills | Status: AC

## 2021-06-01 NOTE — Progress Notes (Signed)
Subjective:  Patient ID: Jonathan [name], male    DOB: 12-27-1955  Age: 67 y.o. MRN: [id number]  CC: Annual Exam, Hypertension, and Osteoarthritis  This visit occurred during the SARS-CoV-2 public health emergency.  Safety protocols were in place, including screening questions prior to the visit, additional usage of staff PPE, and extensive cleaning of exam room while observing appropriate contact time as indicated for disinfecting solutions.    HPI Jonathan [name] presents for a CPX and f/up -   He tells me that musculoskeletal pain continues to affect his daily life and sleep but he is getting adequate symptom control with hydrocodone and acetaminophen.  He is active and denies chest pain, shortness of breath, diaphoresis, dizziness, lightheadedness, or edema.  Outpatient Medications Prior to Visit  Medication Sig Dispense Refill   Ascorbic Acid (VITAMIN C PO) Take 1 tablet by mouth daily.     aspirin EC 81 MG tablet Take 81 mg by mouth 2 (two) times a week. Swallow whole.     Azelastine-Fluticasone 137-50 MCG/ACT SUSP INSTILL 2 SPRAYS IN EACH NOSTRIL TWICE A DAY 23 g 5   BLACK PEPPER-TURMERIC PO Take 1 tablet by mouth daily.     Cod Liver Oil CAPS Take 1 capsule by mouth daily.     dexlansoprazole (DEXILANT) 60 MG capsule Take 1 capsule (60 mg total) by mouth daily. 90 capsule 3   DRYSOL 20 % external solution Apply 1 application topically at bedtime.     dutasteride (AVODART) 0.5 MG capsule Take 1 capsule (0.5 mg total) by mouth daily. 90 capsule 1   HYDROcodone-acetaminophen (NORCO) 10-325 MG tablet Take 1 tablet by mouth every 6 (six) hours as needed for moderate pain. 90 tablet 0   indapamide (LOZOL) 1.25 MG tablet TAKE 1 TABLET BY MOUTH DAILY. 90 tablet 0   LIVALO 4 MG TABS TAKE 1 TABLET BY MOUTH EVERY DAY 90 tablet 1   naloxone (NARCAN) nasal spray 4 mg/0.1 mL Place 1 spray into the nose as needed (opioid overdose).     Omega-3 Fatty Acids (FISH OIL PO) Take 1 capsule  by mouth 2 (two) times a week.     VITAMIN E PO Take 1 capsule by mouth daily.     terbinafine (LAMISIL) 250 MG tablet Take 250 mg by mouth daily.     No facility-administered medications prior to visit.    ROS Review of Systems  Constitutional:  Negative for chills, diaphoresis, fatigue and fever.  HENT: Negative.    Eyes: Negative.   Respiratory: Negative.    Cardiovascular:  Negative for chest pain, palpitations and leg swelling.  Gastrointestinal:  Negative for abdominal pain, constipation, diarrhea, nausea and vomiting.  Endocrine: Negative.   Genitourinary: Negative.  Negative for difficulty urinating and dysuria.  Musculoskeletal:  Positive for arthralgias and back pain. Negative for myalgias and neck pain.  Skin: Negative.  Negative for color change, pallor and rash.  Neurological: Negative.  Negative for dizziness, weakness, light-headedness and numbness.  Hematological:  Negative for adenopathy. Does not bruise/bleed easily.  Psychiatric/Behavioral: Negative.     Objective:  BP 136/78 (BP Location: Right Arm, Patient Position: Sitting, Cuff Size: Large) Comment: 139/88   Pulse 67    Temp 97.7 F (36.5 C) (Oral)    Resp 16    Ht 5\' 11"  (1.803 m)    Wt 206 lb (93.4 kg)    SpO2 98%    BMI 28.73 kg/m   BP Readings from Last 3 Encounters:  06/01/21 136/78  01/28/21 136/86  12/01/20 120/90    Wt Readings from Last 3 Encounters:  06/01/21 206 lb (93.4 kg)  01/28/21 198 lb (89.8 kg)  12/01/20 202 lb (91.6 kg)    Physical Exam Vitals reviewed.  Constitutional:      Appearance: Normal appearance.  HENT:     Nose: Nose normal.     Mouth/Throat:     Mouth: Mucous membranes are moist.  Eyes:     General: No scleral icterus.    Conjunctiva/sclera: Conjunctivae normal.  Cardiovascular:     Rate and Rhythm: Normal rate and regular rhythm.     Heart sounds: Murmur heard.  Systolic murmur is present with a grade of 1/6.  Pulmonary:     Breath sounds: No stridor. No  wheezing, rhonchi or rales.  Abdominal:     Palpations: There is no mass.     Tenderness: There is no abdominal tenderness. There is no guarding or rebound.     Hernia: No hernia is present.  Musculoskeletal:     Cervical back: Neck supple.  Lymphadenopathy:     Cervical: No cervical adenopathy.  Skin:    General: Skin is warm and dry.     Findings: No rash.  Neurological:     General: No focal deficit present.     Mental Status: He is alert. Mental status is at baseline.  Psychiatric:        Mood and Affect: Mood normal.        Behavior: Behavior normal.    Lab Results  Component Value Date   WBC 3.5 11/18/2020   HGB 16.1 11/18/2020   HCT 46.5 11/18/2020   PLT 252 11/18/2020   GLUCOSE 87 01/28/2021   CHOL 191 11/13/2020   TRIG 97 11/13/2020   HDL 55 11/13/2020   LDLDIRECT 131.6 07/14/2011   LDLCALC 118 (H) 11/13/2020   ALT 19 05/19/2020   AST 14 05/19/2020   NA 137 01/28/2021   K 4.4 01/28/2021   CL 99 01/28/2021   CREATININE 1.00 01/28/2021   BUN 13 01/28/2021   CO2 30 01/28/2021   TSH 2.49 05/19/2020   PSA 2.45 01/28/2021   INR 0.97 09/08/2014   HGBA1C 6.1 01/28/2021    ECHO TEE  Result Date: 11/25/2020    TRANSESOPHOGEAL ECHO REPORT   Patient Name:   Jonathan [name] Date of Exam: 11/25/2020 Medical Rec #:  [id number]             Height:       73.0 in Accession #:    [id number]            Weight:       200.0 lb Date of Birth:  December 24, 1955             BSA:          2.152 m Patient Age:    29 years              BP:           139/94 mmHg Patient Gender: M                     HR:           78 bpm. Exam Location:  Inpatient Procedure: Transesophageal Echo, 3D Echo, Color Doppler, Cardiac Doppler and            Saline Contrast Bubble Study Indications:     I34.1 Nonrheumatic mitral (valve) prolapse  History:         Patient has prior history of Echocardiogram examinations, most                  recent 11/06/2020. Risk Factors:Dyslipidemia.  Sonographer:     Raquel Sarna Senior  RDCS Referring Phys:  [place] Diagnosing Phys: Eleonore Chiquito MD PROCEDURE: After discussion of the risks and benefits of a TEE, an informed consent was obtained from the patient. TEE procedure time was 35 minutes. The transesophogeal probe was passed without difficulty through the esophogus of the patient. Local oropharyngeal anesthetic was provided with Cetacaine. Sedation performed by different physician. The patient was monitored while under deep sedation. Anesthestetic sedation was provided intravenously by Anesthesiology: 447mg  of Propofol. Image quality was excellent. The patient's vital signs; including heart rate, blood pressure, and oxygen saturation; remained stable throughout the procedure. The patient developed no complications during the procedure. IMPRESSIONS  1. There is severe prolapse of the P1-P2 segment. There is no flail segment. There is an eccentric anteriorly directed jet of mitral regurgitation. 2D PISA radius 1.3 cm, but 2D ERO 0.29 cm2 and R vol 39 cc after angle correction for the eccentric jet (67 degrees). 3D VCA is also consistent with moderate mitral regurgitation 0.31 cm2. There is a second central jet in the P2 region which could represent a cleft or related to the severe prolapse of the P2 segment. The pulmonary vein flow is systolic dominant in all 4 pulmonary veins. The LA size is normal. RVSP is normal. Overall, the findings are consistent with moderate mitral regurgitation. Would recommned to follow this clinically and with serial echocardiograms. The mitral valve is myxomatous. Moderate mitral valve regurgitation.  2. Left ventricular ejection fraction by 3D volume is 66 %. The left ventricle has normal function.  3. Right ventricular systolic function is normal. The right ventricular size is normal.  4. No left atrial/left atrial appendage thrombus was detected. The LAA emptying velocity was 61 cm/s.  5. The aortic valve is tricuspid. Aortic valve regurgitation is  not visualized. No aortic stenosis is present.  6. Agitated saline contrast bubble study was negative, with no evidence of any interatrial shunt. FINDINGS  Left Ventricle: Left ventricular ejection fraction by 3D volume is 66 %. The left ventricle has normal function. The left ventricular internal cavity size was normal in size. Right Ventricle: The right ventricular size is normal. No increase in right ventricular wall thickness. Right ventricular systolic function is normal. Left Atrium: Left atrial size was normal in size. No left atrial/left atrial appendage thrombus was detected. The LAA emptying velocity was 61 cm/s. Right Atrium: Right atrial size was normal in size. Pericardium: There is no evidence of pericardial effusion. Mitral Valve: There is severe prolapse of the P1-P2 segment. There is no flail segment. There is an eccentric anteriorly directed jet of mitral regurgitation. 2D PISA radius 1.3 cm, but 2D ERO 0.29 cm2 and R vol 39 cc after angle correction for the eccentric jet (67 degrees). 3D VCA is also consistent with moderate mitral regurgitation 0.31 cm2. There is a second central jet in the P2 region which could represent a cleft or related to the severe prolapse of the P2 segment. The pulmonary vein flow is systolic dominant in all 4 pulmonary veins. The LA size is normal. RVSP is normal. Overall, the findings are consistent with moderate mitral regurgitation. Would recommned to follow this clinically and with serial echocardiograms. The mitral valve is myxomatous. Moderate mitral valve regurgitation. Tricuspid  Valve: The tricuspid valve is normal in structure. Tricuspid valve regurgitation is trivial. No evidence of tricuspid stenosis. Aortic Valve: The aortic valve is tricuspid. Aortic valve regurgitation is not visualized. No aortic stenosis is present. Pulmonic Valve: The pulmonic valve was normal in structure. Pulmonic valve regurgitation is trivial. No evidence of pulmonic stenosis. Aorta:  The aortic root and ascending aorta are structurally normal, with no evidence of dilitation. There is minimal (Grade I) layered plaque involving the descending aorta. Venous: A normal flow pattern is recorded from the left upper pulmonary vein, the left lower pulmonary vein, the right upper pulmonary vein and the right lower pulmonary vein. IAS/Shunts: No atrial level shunt detected by color flow Doppler. Agitated saline contrast was given intravenously to evaluate for intracardiac shunting. Agitated saline contrast bubble study was negative, with no evidence of any interatrial shunt.  LEFT VENTRICLE PLAX 2D LVOT diam:     2.50 cm LV SV:         94 LV SV Index:   44              3D Volume EF LVOT Area:     4.91 cm        LV 3D EF:    Left                                             ventricular                                             ejection                                             fraction by                                             3D volume                                             is 66 %.                                 3D Volume EF                                LV 3D EF:    65.50 %                                LV 3D EDV:   99100.00                                             mm  LV 3D ESV:   34100.00                                             mm                                LV 3D SV:    64900.00                                             mm                                 3D Volume EF:                                3D EF:        66 % AORTIC VALVE LVOT Vmax:   110.00 cm/s LVOT Vmean:  56.800 cm/s LVOT VTI:    0.191 m  AORTA Ao Root diam: 3.50 cm Ao Asc diam:  2.79 cm MR Peak grad:    96.2 mmHg   TRICUSPID VALVE MR Mean grad:    47.5 mmHg   TR Peak grad:   6.5 mmHg MR Vmax:         490.50 cm/s TR Vmax:        127.00 cm/s MR Vmean:        321.0 cm/s MR PISA:         10.62 cm   SHUNTS MR PISA Eff ROA: 83 mm      Systemic VTI:  0.19 m MR PISA Radius:  1.30 cm      Systemic Diam: 2.50 cm Eleonore Chiquito MD Electronically signed by Eleonore Chiquito MD Signature Date/Time: 11/25/2020/1:03:28 PM    Final     Assessment & Plan:   Jonathan [name] was seen today for annual exam, hypertension and osteoarthritis.  Diagnoses and all orders for this visit:  Nonrheumatic mitral valve regurgitation- He is asymptomatic with this.  Will prophylax against SBE.  Need for SBE (subacute bacterial endocarditis) prophylaxis -     amoxicillin (AMOXIL) 500 MG capsule; Take 4 capsules (2,000 mg total) by mouth once for 1 dose. One hour prior to dental procedures  Essential hypertension- His blood pressure is adequately well controlled.  Chronic diastolic heart failure ([place])- He is asymptomatic and has a normal volume status.  Encounter for general adult medical examination with abnormal findings- Exam completed, labs reviewed, vaccines reviewed - he refused a pneumonia vaccine, cancer screenings are up-to-date, patient education was given.  Need for shingles vaccine -     Zoster Vaccine Adjuvanted [facility]) injection; Inject 0.5 mLs into the muscle once for 1 dose.   I have discontinued Jonathan [name]'s terbinafine. I am also having him start on Shingrix. Additionally, I am having him maintain his Drysol, naloxone, aspirin EC, Ascorbic Acid (VITAMIN C PO), Cod Liver Oil, BLACK PEPPER-TURMERIC PO, VITAMIN E PO, Omega-3 Fatty Acids (FISH OIL PO), dexlansoprazole, Azelastine-Fluticasone, dutasteride, indapamide, Livalo, and HYDROcodone-acetaminophen.  Meds ordered this encounter  Medications   amoxicillin (AMOXIL) 500 MG capsule  Sig: Take 4 capsules (2,000 mg total) by mouth once for 1 dose. One hour prior to dental procedures    Dispense:  4 capsule    Refill:  2   Zoster Vaccine Adjuvanted [facility]) injection    Sig: Inject 0.5 mLs into the muscle once for 1 dose.    Dispense:  0.5 mL    Refill:  1     Follow-up: Return in about 6 months (around  11/29/2021).  Scarlette Calico, MD

## 2021-06-01 NOTE — Patient Instructions (Signed)

## 2021-06-06 MED ORDER — SHINGRIX 50 MCG/0.5ML IM SUSR: 0.5000 mL | Freq: Once | INTRAMUSCULAR | 1 refills | Status: AC

## 2021-06-21 ENCOUNTER — Telehealth: Payer: Self-pay

## 2021-06-21 ENCOUNTER — Other Ambulatory Visit: Payer: Self-pay | Admitting: Internal Medicine

## 2021-06-21 ENCOUNTER — Telehealth: Payer: Self-pay | Admitting: Cardiology

## 2021-06-21 DIAGNOSIS — M87051 Idiopathic aseptic necrosis of right femur: Secondary | ICD-10-CM

## 2021-06-21 DIAGNOSIS — M48061 Spinal stenosis, lumbar region without neurogenic claudication: Secondary | ICD-10-CM

## 2021-06-21 DIAGNOSIS — M87052 Idiopathic aseptic necrosis of left femur: Secondary | ICD-10-CM

## 2021-06-21 MED ORDER — HYDROCODONE-ACETAMINOPHEN 10-325 MG PO TABS: 1.0000 | ORAL_TABLET | Freq: Four times a day (QID) | ORAL | 0 refills | Status: DC | PRN

## 2021-06-21 NOTE — Telephone Encounter (Signed)
Pt calling in requesting a refill on: HYDROcodone-acetaminophen (NORCO) 10-325 MG tablet  LOV:  06/01/21  Pharmacy: [facility] ([facility] [place]

## 2021-06-21 NOTE — Telephone Encounter (Signed)
New Message:     Patient's wife called and wants to know why patient's TEE was cancelled? She says the patient needs that test..

## 2021-06-21 NOTE — Telephone Encounter (Signed)
Spoke with pt's wife. Pt has appointment on 07/23/21 for office visit. At this time echo can be set up for pt.

## 2021-06-21 NOTE — Telephone Encounter (Signed)
Spoke with pt regarding cancelled echo or TEE. I can not find evidence of either one of the studies being scheduled and/or cancelled. Per Dr. Rosezella [place] results for TEE, pt is to follow up with him in a year after TEE was done on 11/06/20. Advised wife of these instructions. Pt is scheduled to see Dr. Percival Spanish on 2/24. Explained that this study could be discussed at this time. Will verbalizes understanding.

## 2021-07-12 ENCOUNTER — Ambulatory Visit: Payer: Medicare Other | Admitting: Gastroenterology

## 2021-07-22 ENCOUNTER — Ambulatory Visit: Payer: Medicare Other | Admitting: Cardiology

## 2021-07-23 ENCOUNTER — Telehealth: Payer: Self-pay | Admitting: Internal Medicine

## 2021-07-23 ENCOUNTER — Ambulatory Visit: Payer: Medicare Other | Admitting: Cardiology

## 2021-07-23 NOTE — Telephone Encounter (Signed)
1.Medication Requested: HYDROcodone-acetaminophen (NORCO) 10-325 MG tablet  2. Pharmacy (Name, [place], [place]): [id number] - [place], [address].  Phone:  [phone] Fax:  [phone]   3. On Med List: yes  4. Last Visit with PCP: 01.03.23  5. Next visit date with PCP: 07.03.23   Agent: Please be advised that RX refills may take up to 3 business days. We ask that you follow-up with your pharmacy.

## 2021-07-24 ENCOUNTER — Other Ambulatory Visit: Payer: Self-pay | Admitting: Internal Medicine

## 2021-07-24 DIAGNOSIS — M48061 Spinal stenosis, lumbar region without neurogenic claudication: Secondary | ICD-10-CM

## 2021-07-24 DIAGNOSIS — I341 Nonrheumatic mitral (valve) prolapse: Secondary | ICD-10-CM | POA: Insufficient documentation

## 2021-07-24 DIAGNOSIS — M87051 Idiopathic aseptic necrosis of right femur: Secondary | ICD-10-CM

## 2021-07-24 MED ORDER — HYDROCODONE-ACETAMINOPHEN 10-325 MG PO TABS: 1.0000 | ORAL_TABLET | Freq: Four times a day (QID) | ORAL | 0 refills | Status: DC | PRN

## 2021-07-24 NOTE — Progress Notes (Signed)
Cardiology Office Note   Date:  07/26/2021   ID:  ZYMIERE, TROSTLE 10-Aug-1955, MRN [id number]  PCP:  Janith Lima, MD  Cardiologist:   Minus Breeding, MD Referring:  Janith Lima, MD   Chief Complaint  Patient presents with   Mitral Regurgitation            History of Present Illness: Jonathan [name] is a 66 y.o. male who I have not seen since 2013. He was seen by Odie Sera NP last year for evaluation of chronic diastolic HF.    He also has bradycardia.  He has had some chronic diastolic dysfunction.  In June 2022 I sent him for an echo with a myxomatous mitral valve prolapse of both leaflets.  The MR was at least moderate.  TEE confirmed this to be moderate and suggested continued follow-up.  He returns for this.  He has done very well from a cardiovascular standpoint. The patient denies any new symptoms such as chest discomfort, neck or arm discomfort. There has been no new shortness of breath, PND or orthopnea. There have been no reported palpitations, presyncope or syncope.   He does activity such as walking and denies any cardiovascular symptoms with this.  He does a little light weights as well.   Past Medical History:  Diagnosis Date   Anxiety    Arthritis    Avascular necrosis of bone ([place])    left hip    Back pain, lumbosacral 2000   Blind right eye    Colon polyps 08/29/2011   Hyperplastic colon polyps   Mitral valve prolapse     Past Surgical History:  Procedure Laterality Date   BUBBLE STUDY  11/25/2020   Procedure: BUBBLE STUDY;  Surgeon: Geralynn Rile, MD;  Location: [facility];  Service: Cardiovascular;;   EYE SURGERY  2007   Fractured bones repaired/blind in right eye   HERNIA REPAIR     Right inguinal   SPINE SURGERY     TEE WITHOUT CARDIOVERSION N/A 11/25/2020   Procedure: TRANSESOPHAGEAL ECHOCARDIOGRAM (TEE);  Surgeon: Geralynn Rile, MD;  Location: [facility];  Service: Cardiovascular;  Laterality:  N/A;   TOTAL HIP ARTHROPLASTY Right 09/18/2014   Procedure: RIGHT TOTAL HIP ARTHROPLASTY ANTERIOR APPROACH;  Surgeon: Rod Can, MD;  Location: [facility];  Service: Orthopedics;  Laterality: Right;   TOTAL HIP ARTHROPLASTY Left 04/06/2017   Procedure: LEFT TOTAL HIP ARTHROPLASTY ANTERIOR APPROACH;  Surgeon: Rod Can, MD;  Location: [facility];  Service: Orthopedics;  Laterality: Left;  Needs RNFA     Current Outpatient Medications  Medication Sig Dispense Refill   dexlansoprazole (DEXILANT) 60 MG capsule Take 1 capsule (60 mg total) by mouth daily. 90 capsule 3   dutasteride (AVODART) 0.5 MG capsule Take 1 capsule (0.5 mg total) by mouth daily. 90 capsule 1   HYDROcodone-acetaminophen (NORCO) 10-325 MG tablet Take 1 tablet by mouth every 6 (six) hours as needed for moderate pain. 90 tablet 0   indapamide (LOZOL) 1.25 MG tablet TAKE 1 TABLET BY MOUTH DAILY. 90 tablet 0   LIVALO 4 MG TABS TAKE 1 TABLET BY MOUTH EVERY DAY 90 tablet 1   Ascorbic Acid (VITAMIN C PO) Take 1 tablet by mouth daily. (Patient not taking: Reported on 07/26/2021)     aspirin EC 81 MG tablet Take 81 mg by mouth 2 (two) times a week. Swallow whole. (Patient not taking: Reported on 07/26/2021)     Azelastine-Fluticasone 137-50 MCG/ACT  SUSP INSTILL 2 SPRAYS IN EACH NOSTRIL TWICE A DAY (Patient not taking: Reported on 07/26/2021) 23 g 5   BLACK PEPPER-TURMERIC PO Take 1 tablet by mouth daily. (Patient not taking: Reported on 07/26/2021)     Cod Liver Oil CAPS Take 1 capsule by mouth daily. (Patient not taking: Reported on 07/26/2021)     DRYSOL 20 % external solution Apply 1 application topically at bedtime. (Patient not taking: Reported on 07/26/2021)     naloxone [facility]) nasal spray 4 mg/0.1 mL Place 1 spray into the nose as needed (opioid overdose). (Patient not taking: Reported on 07/26/2021)     Omega-3 Fatty Acids (FISH OIL PO) Take 1 capsule by mouth 2 (two) times a week. (Patient not taking: Reported on 07/26/2021)      VITAMIN E PO Take 1 capsule by mouth daily. (Patient not taking: Reported on 07/26/2021)     No current facility-administered medications for this visit.    Allergies:   Cortisone and Lipitor [atorvastatin calcium]    ROS:  Please see the history of present illness.   Otherwise, review of systems are positive for none.   All other systems are reviewed and negative.    PHYSICAL EXAM: VS:  BP (!) 144/88    Pulse (!) 45    Ht 6' 1.5" (1.867 m)    Wt 205 lb 6.4 oz (93.2 kg)    SpO2 97%    BMI 26.73 kg/m  , BMI Body mass index is 26.73 kg/m. GENERAL:  Well appearing NECK:  No jugular venous distention, waveform within normal limits, carotid upstroke brisk and symmetric, no bruits, no thyromegaly LUNGS:  Clear to auscultation bilaterally CHEST:  Unremarkable HEART:  PMI not displaced or sustained,S1 and S2 within normal limits, no S3, no S4, no clicks, no rubs, 3 out of 6 apical and axillary late systolic murmur, no diastolic murmurs ABD:  Flat, positive bowel sounds normal in frequency in pitch, no bruits, no rebound, no guarding, no midline pulsatile mass, no hepatomegaly, no splenomegaly EXT:  2 plus pulses throughout, no edema, no cyanosis no clubbing     EKG:  EKG is  ordered today. The ekg ordered today demonstrates Sinus bradycardia, rate 45, axis within normal limits, intervals within normal limits, none    Recent Labs: 11/18/2020: Hemoglobin 16.1; Platelets 252 01/28/2021: BUN 13; Creatinine, Ser 1.00; Potassium 4.4; Sodium 137    Lipid Panel    Component Value Date/Time   CHOL 191 11/13/2020 0852   TRIG 97 11/13/2020 0852   HDL 55 11/13/2020 0852   CHOLHDL 3.5 11/13/2020 0852   CHOLHDL 4 05/19/2020 0959   VLDL 25.8 05/19/2020 0959   LDLCALC 118 (H) 11/13/2020 0852   LDLDIRECT 131.6 07/14/2011 0940      Wt Readings from Last 3 Encounters:  07/26/21 205 lb 6.4 oz (93.2 kg)  06/01/21 206 lb (93.4 kg)  01/28/21 198 lb (89.8 kg)      Other studies  Reviewed: Additional studies/ records that were reviewed today include: TEE, labs Review of the above records demonstrates:  Please see elsewhere in the note.     ASSESSMENT AND PLAN:  Diastolic heart failure :    He seems to be euvolemic.  He is up-to-date with blood work.  No change in therapy.  Bradycardia:   He tolerates this rhythm.  He has had no presyncope or syncope.  No change in therapy.   Essential hypertension: His blood pressure is at target.  He will keep an eye  on this as it is somewhat borderline.   MR:   The MR was moderate .  I will repeat an echocardiogram in June transthoracic and consider follow-up TEE is indicated.  He will eventually need to have this repaired.   Dyslipidemia:    LDL was 118 with an HDL of 55.  No change in therapy.  Tobacco use:    He still smokes a few cigarettes and knows that I am pushing him for complete abstinence.    Current medicines are reviewed at length with the patient today.  The patient does not have concerns regarding medicines.  The following changes have been made:  no change  Labs/ tests ordered today include:   Orders Placed This Encounter  Procedures   EKG 12-Lead   ECHOCARDIOGRAM COMPLETE      Disposition:   FU with me in 1 year   Signed, Minus Breeding, MD  07/26/2021 6:12 PM    [facility]

## 2021-07-26 ENCOUNTER — Other Ambulatory Visit: Payer: Self-pay

## 2021-07-26 ENCOUNTER — Ambulatory Visit (INDEPENDENT_AMBULATORY_CARE_PROVIDER_SITE_OTHER): Payer: Medicare Other | Admitting: Cardiology

## 2021-07-26 ENCOUNTER — Encounter: Payer: Self-pay | Admitting: Cardiology

## 2021-07-26 VITALS — BP 144/88 | HR 45 | Ht 73.5 in | Wt 205.4 lb

## 2021-07-26 DIAGNOSIS — R001 Bradycardia, unspecified: Secondary | ICD-10-CM

## 2021-07-26 DIAGNOSIS — E785 Hyperlipidemia, unspecified: Secondary | ICD-10-CM

## 2021-07-26 DIAGNOSIS — I341 Nonrheumatic mitral (valve) prolapse: Secondary | ICD-10-CM | POA: Diagnosis not present

## 2021-07-26 DIAGNOSIS — I10 Essential (primary) hypertension: Secondary | ICD-10-CM

## 2021-07-26 NOTE — Patient Instructions (Signed)
Medication Instructions:  Your physician recommends that you continue on your current medications as directed. Please refer to the Current Medication list given to you today.  *If you need a refill on your cardiac medications before your next appointment, please call your pharmacy*   Testing/Procedures: Your physician has requested that you have an echocardiogram. Echocardiography is a painless test that uses sound waves to create images of your heart. It provides your doctor with information about the size and shape of your heart and how well your hearts chambers and valves are working. This procedure takes approximately one hour. There are no restrictions for this procedure. To be done in June. This procedure is done at [address]    Follow-Up: At [facility], you and your health needs are our priority.  As part of our continuing mission to provide you with exceptional heart care, we have created designated Provider Care Teams.  These Care Teams include your primary Cardiologist (physician) and Advanced Practice Providers (APPs -  Physician Assistants and Nurse Practitioners) who all work together to provide you with the care you need, when you need it.  We recommend signing up for the patient portal called "MyChart".  Sign up information is provided on this After Visit Summary.  MyChart is used to connect with patients for Virtual Visits (Telemedicine).  Patients are able to view lab/test results, encounter notes, upcoming appointments, etc.  Non-urgent messages can be sent to your provider as well.   To learn more about what you can do with MyChart, go to [email or web address].    Your next appointment:   6 month(s)  The format for your next appointment:   In Person  Provider:   Minus Breeding, MD

## 2021-08-09 ENCOUNTER — Telehealth: Payer: Self-pay | Admitting: Internal Medicine

## 2021-08-09 NOTE — Telephone Encounter (Signed)
Certification of disability form has been completed and given to PCP to sign.  ?

## 2021-08-09 NOTE — Telephone Encounter (Signed)
PT's son visits today and leaves forms for Dr.Jones to be filled out! Forms have been left in the mailbox ?

## 2021-08-09 NOTE — Telephone Encounter (Signed)
Form signed and pt informed it is ready for pick up at the front desk ? ? ?

## 2021-08-16 ENCOUNTER — Other Ambulatory Visit: Payer: Self-pay | Admitting: Internal Medicine

## 2021-08-16 ENCOUNTER — Telehealth: Payer: Self-pay

## 2021-08-16 DIAGNOSIS — M48061 Spinal stenosis, lumbar region without neurogenic claudication: Secondary | ICD-10-CM

## 2021-08-16 DIAGNOSIS — Z79891 Long term (current) use of opiate analgesic: Secondary | ICD-10-CM

## 2021-08-16 DIAGNOSIS — M87051 Idiopathic aseptic necrosis of right femur: Secondary | ICD-10-CM

## 2021-08-16 MED ORDER — HYDROCODONE-ACETAMINOPHEN 10-325 MG PO TABS: 1.0000 | ORAL_TABLET | Freq: Four times a day (QID) | ORAL | 0 refills | Status: DC | PRN

## 2021-08-16 MED ORDER — NALOXONE HCL 4 MG/0.1ML NA LIQD: 1.0000 | NASAL | 2 refills | Status: AC | PRN

## 2021-08-16 NOTE — Telephone Encounter (Signed)
Pt is requesting a refill on: ?HYDROcodone-acetaminophen (NORCO) 10-325 MG tablet ? ?Pharmacy: ?[id number] [place] ? ?LOV 06/01/21 ?ROV 73/23 ? ?

## 2021-09-02 ENCOUNTER — Other Ambulatory Visit: Payer: Self-pay | Admitting: Internal Medicine

## 2021-09-02 DIAGNOSIS — I10 Essential (primary) hypertension: Secondary | ICD-10-CM

## 2021-09-02 DIAGNOSIS — I519 Heart disease, unspecified: Secondary | ICD-10-CM

## 2021-09-15 ENCOUNTER — Telehealth: Payer: Self-pay | Admitting: Internal Medicine

## 2021-09-15 NOTE — Telephone Encounter (Signed)
1.Medication Requested: HYDROcodone-acetaminophen (NORCO) 10-325 MG tablet ? ?2. Pharmacy (Name, Street, [place]): [id number] , [address] ? ?3. On Med List: Y ? ?4. Last Visit with PCP: 06-01-2021 ? ?5. Next visit date with PCP: 11-29-2021 ? ? ?Agent: Please be advised that RX refills may take up to 3 business days. We ask that you follow-up with your pharmacy.  ?

## 2021-09-17 ENCOUNTER — Other Ambulatory Visit: Payer: Self-pay | Admitting: Internal Medicine

## 2021-09-17 DIAGNOSIS — M87052 Idiopathic aseptic necrosis of left femur: Secondary | ICD-10-CM

## 2021-09-17 DIAGNOSIS — M48061 Spinal stenosis, lumbar region without neurogenic claudication: Secondary | ICD-10-CM

## 2021-09-17 MED ORDER — HYDROCODONE-ACETAMINOPHEN 10-325 MG PO TABS: 1.0000 | ORAL_TABLET | Freq: Four times a day (QID) | ORAL | 0 refills | Status: DC | PRN

## 2021-10-14 ENCOUNTER — Telehealth: Payer: Self-pay | Admitting: Internal Medicine

## 2021-10-14 NOTE — Telephone Encounter (Signed)
1.Medication Requested: HYDROcodone-acetaminophen (NORCO) 10-325 MG tablet 2. Pharmacy (Name, Street, [place]): [id number] [address] Phone:  [phone] Fax:  [phone]    3. On Med List: yes  4. Last Visit with PCP:  5. Next visit date with PCP:   Agent: Please be advised that RX refills may take up to 3 business days. We ask that you follow-up with your pharmacy.

## 2021-10-16 ENCOUNTER — Other Ambulatory Visit: Payer: Self-pay | Admitting: Internal Medicine

## 2021-10-16 DIAGNOSIS — M48061 Spinal stenosis, lumbar region without neurogenic claudication: Secondary | ICD-10-CM

## 2021-10-16 DIAGNOSIS — M87052 Idiopathic aseptic necrosis of left femur: Secondary | ICD-10-CM

## 2021-10-16 MED ORDER — HYDROCODONE-ACETAMINOPHEN 10-325 MG PO TABS: 1.0000 | ORAL_TABLET | Freq: Four times a day (QID) | ORAL | 0 refills | Status: DC | PRN

## 2021-11-01 ENCOUNTER — Ambulatory Visit (HOSPITAL_COMMUNITY): Payer: Medicare Other | Attending: Cardiovascular Disease

## 2021-11-01 DIAGNOSIS — I341 Nonrheumatic mitral (valve) prolapse: Secondary | ICD-10-CM

## 2021-11-01 LAB — ECHOCARDIOGRAM COMPLETE
Area-P 1/2: 4.68 cm2
MV M vel: 3.62 m/s
MV Peak grad: 52.4 mmHg
Radius: 1 cm
S' Lateral: 2.7 cm

## 2021-11-03 ENCOUNTER — Encounter: Payer: Self-pay | Admitting: Cardiovascular Disease

## 2021-11-03 NOTE — Telephone Encounter (Signed)
Error

## 2021-11-10 ENCOUNTER — Encounter: Payer: Self-pay | Admitting: *Deleted

## 2021-11-15 ENCOUNTER — Telehealth: Payer: Self-pay | Admitting: Internal Medicine

## 2021-11-15 NOTE — Telephone Encounter (Signed)
Pt called in requesting a refill on HYDROcodone-acetaminophen (NORCO) 10-325 MG tablet.  Pharmacy:  [id number] [place], [place] [address]

## 2021-11-16 ENCOUNTER — Other Ambulatory Visit: Payer: Self-pay | Admitting: Internal Medicine

## 2021-11-16 DIAGNOSIS — M48061 Spinal stenosis, lumbar region without neurogenic claudication: Secondary | ICD-10-CM

## 2021-11-16 DIAGNOSIS — M87051 Idiopathic aseptic necrosis of right femur: Secondary | ICD-10-CM

## 2021-11-16 MED ORDER — HYDROCODONE-ACETAMINOPHEN 10-325 MG PO TABS: 1.0000 | ORAL_TABLET | Freq: Four times a day (QID) | ORAL | 0 refills | Status: AC | PRN

## 2021-11-17 ENCOUNTER — Other Ambulatory Visit: Payer: Self-pay | Admitting: Internal Medicine

## 2021-11-17 DIAGNOSIS — L819 Disorder of pigmentation, unspecified: Secondary | ICD-10-CM | POA: Diagnosis not present

## 2021-11-17 DIAGNOSIS — L852 Keratosis punctata (palmaris et plantaris): Secondary | ICD-10-CM | POA: Diagnosis not present

## 2021-11-17 DIAGNOSIS — L720 Epidermal cyst: Secondary | ICD-10-CM | POA: Diagnosis not present

## 2021-11-17 DIAGNOSIS — L821 Other seborrheic keratosis: Secondary | ICD-10-CM | POA: Diagnosis not present

## 2021-11-17 DIAGNOSIS — I10 Essential (primary) hypertension: Secondary | ICD-10-CM

## 2021-11-17 DIAGNOSIS — D235 Other benign neoplasm of skin of trunk: Secondary | ICD-10-CM | POA: Diagnosis not present

## 2021-11-17 DIAGNOSIS — I519 Heart disease, unspecified: Secondary | ICD-10-CM

## 2021-11-29 ENCOUNTER — Encounter: Payer: Self-pay | Admitting: Internal Medicine

## 2021-11-29 ENCOUNTER — Ambulatory Visit (INDEPENDENT_AMBULATORY_CARE_PROVIDER_SITE_OTHER): Payer: Medicare Other | Admitting: Internal Medicine

## 2021-11-29 VITALS — BP 120/72 | HR 53 | Temp 98.3°F | Ht 73.5 in | Wt 203.0 lb

## 2021-11-29 DIAGNOSIS — K2100 Gastro-esophageal reflux disease with esophagitis, without bleeding: Secondary | ICD-10-CM

## 2021-11-29 DIAGNOSIS — E785 Hyperlipidemia, unspecified: Secondary | ICD-10-CM

## 2021-11-29 DIAGNOSIS — I10 Essential (primary) hypertension: Secondary | ICD-10-CM | POA: Diagnosis not present

## 2021-11-29 DIAGNOSIS — R7303 Prediabetes: Secondary | ICD-10-CM | POA: Diagnosis not present

## 2021-11-29 DIAGNOSIS — T50905A Adverse effect of unspecified drugs, medicaments and biological substances, initial encounter: Secondary | ICD-10-CM

## 2021-11-29 LAB — CBC WITH DIFFERENTIAL/PLATELET
Basophils Absolute: 0 10*3/uL (ref 0.0–0.1)
Basophils Relative: 0.5 % (ref 0.0–3.0)
Eosinophils Absolute: 0.1 10*3/uL (ref 0.0–0.7)
Eosinophils Relative: 1.2 % (ref 0.0–5.0)
HCT: 52.4 % — ABNORMAL HIGH (ref 39.0–52.0)
Hemoglobin: 17.7 g/dL — ABNORMAL HIGH (ref 13.0–17.0)
Lymphocytes Relative: 53.6 % — ABNORMAL HIGH (ref 12.0–46.0)
Lymphs Abs: 2.6 10*3/uL (ref 0.7–4.0)
MCHC: 33.8 g/dL (ref 30.0–36.0)
MCV: 83.7 fl (ref 78.0–100.0)
Monocytes Absolute: 0.6 10*3/uL (ref 0.1–1.0)
Monocytes Relative: 13 % — ABNORMAL HIGH (ref 3.0–12.0)
Neutro Abs: 1.5 10*3/uL (ref 1.4–7.7)
Neutrophils Relative %: 31.7 % — ABNORMAL LOW (ref 43.0–77.0)
Platelets: 255 10*3/uL (ref 150.0–400.0)
RBC: 6.26 Mil/uL — ABNORMAL HIGH (ref 4.22–5.81)
RDW: 14.4 % (ref 11.5–15.5)
WBC: 4.8 10*3/uL (ref 4.0–10.5)

## 2021-11-29 LAB — BASIC METABOLIC PANEL
BUN: 14 mg/dL (ref 6–23)
CO2: 27 mEq/L (ref 19–32)
Calcium: 10.8 mg/dL — ABNORMAL HIGH (ref 8.4–10.5)
Chloride: 98 mEq/L (ref 96–112)
Creatinine, Ser: 1.24 mg/dL (ref 0.40–1.50)
GFR: 59.84 mL/min — ABNORMAL LOW (ref >60.00)
Glucose, Bld: 122 mg/dL — ABNORMAL HIGH (ref 70–99)
Potassium: 3.9 mEq/L (ref 3.5–5.1)
Sodium: 134 mEq/L — ABNORMAL LOW (ref 135–145)

## 2021-11-29 LAB — LIPID PANEL
Cholesterol: 205 mg/dL — ABNORMAL HIGH (ref 0–200)
HDL: 51.3 mg/dL (ref >39.00)
LDL Cholesterol: 125 mg/dL — ABNORMAL HIGH (ref 0–99)
NonHDL: 153.49
Total CHOL/HDL Ratio: 4
Triglycerides: 143 mg/dL (ref 0.0–149.0)
VLDL: 28.6 mg/dL (ref 0.0–40.0)

## 2021-11-29 LAB — HEMOGLOBIN A1C: Hgb A1c MFr Bld: 6.1 % (ref 4.6–6.5)

## 2021-11-29 MED ORDER — LIVALO 4 MG PO TABS
1.0000 | ORAL_TABLET | Freq: Every day | ORAL | 1 refills | Status: DC
Start: 2021-11-29 — End: 2023-11-08

## 2021-11-29 NOTE — Progress Notes (Unsigned)
Subjective:  Patient ID: Jonathan [name], male    DOB: April 18, 1956  Age: 66 y.o. MRN: [id number]  CC: Hypertension and Hyperlipidemia   HPI Jonathan [name] presents for f/up -   He complains of a 1 year history of sinus symptoms.  He denies chest pain, shortness of breath, diaphoresis, edema, or fatigue.  He continues to complain of musculoskeletal pain.  Outpatient Medications Prior to Visit  Medication Sig Dispense Refill   aspirin EC 81 MG tablet Take 81 mg by mouth 2 (two) times a week. Swallow whole.     Azelastine-Fluticasone 137-50 MCG/ACT SUSP INSTILL 2 SPRAYS IN EACH NOSTRIL TWICE A DAY 23 g 5   dexlansoprazole (DEXILANT) 60 MG capsule Take 1 capsule (60 mg total) by mouth daily. 90 capsule 3   dutasteride (AVODART) 0.5 MG capsule Take 1 capsule (0.5 mg total) by mouth daily. 90 capsule 1   HYDROcodone-acetaminophen (NORCO) 10-325 MG tablet Take 1 tablet by mouth every 6 (six) hours as needed for moderate pain. 90 tablet 0   LIVALO 4 MG TABS TAKE 1 TABLET BY MOUTH EVERY DAY 90 tablet 1   naloxone (NARCAN) nasal spray 4 mg/0.1 mL Place 1 spray into the nose as needed (opioid overdose). 2 each 2   indapamide (LOZOL) 1.25 MG tablet TAKE 1 TABLET BY MOUTH EVERY DAY 90 tablet 0   No facility-administered medications prior to visit.    ROS Review of Systems  Constitutional: Negative.  Negative for diaphoresis, fatigue and unexpected weight change.  HENT: Negative.  Negative for postnasal drip, sinus pressure and sore throat.   Eyes: Negative.   Respiratory:  Negative for cough, chest tightness, shortness of breath and wheezing.   Cardiovascular:  Negative for chest pain, palpitations and leg swelling.  Gastrointestinal:  Negative for abdominal pain, constipation, diarrhea, nausea and vomiting.  Endocrine: Negative.   Genitourinary: Negative.  Negative for difficulty urinating.  Musculoskeletal:  Positive for arthralgias and back pain.  Skin: Negative.    Neurological:  Negative for dizziness, weakness, light-headedness and numbness.  Hematological:  Negative for adenopathy. Does not bruise/bleed easily.  Psychiatric/Behavioral: Negative.      Objective:  BP 120/72 (BP Location: Right Arm, Patient Position: Sitting, Cuff Size: Large)   Pulse (!) 53   Temp 98.3 F (36.8 C) (Oral)   Ht 6' 1.5" (1.867 m)   Wt 203 lb (92.1 kg)   SpO2 97%   BMI 26.42 kg/m   BP Readings from Last 3 Encounters:  11/29/21 120/72  07/26/21 (!) 144/88  06/01/21 136/78    Wt Readings from Last 3 Encounters:  11/29/21 203 lb (92.1 kg)  07/26/21 205 lb 6.4 oz (93.2 kg)  06/01/21 206 lb (93.4 kg)    Physical Exam Vitals reviewed.  HENT:     Mouth/Throat:     Mouth: Mucous membranes are moist.  Eyes:     General: No scleral icterus.    Conjunctiva/sclera: Conjunctivae normal.  Cardiovascular:     Rate and Rhythm: Bradycardia present. Rhythm irregularly irregular.     Heart sounds: Murmur heard.  Pulmonary:     Effort: Pulmonary effort is normal.     Breath sounds: No stridor. No wheezing, rhonchi or rales.  Abdominal:     General: Abdomen is flat.     Palpations: There is no mass.     Tenderness: There is no abdominal tenderness. There is no guarding.     Hernia: No hernia is present.  Musculoskeletal:  General: Normal range of motion.     Cervical back: Neck supple.     Right lower leg: No edema.     Left lower leg: No edema.  Lymphadenopathy:     Cervical: No cervical adenopathy.  Skin:    General: Skin is warm and dry.  Neurological:     General: No focal deficit present.     Mental Status: He is alert.  Psychiatric:        Mood and Affect: Mood normal.        Behavior: Behavior normal.     Lab Results  Component Value Date   WBC 4.8 11/29/2021   HGB 17.7 (H) 11/29/2021   HCT 52.4 (H) 11/29/2021   PLT 255.0 11/29/2021   GLUCOSE 122 (H) 11/29/2021   CHOL 205 (H) 11/29/2021   TRIG 143.0 11/29/2021   HDL 51.30  11/29/2021   LDLDIRECT 131.6 07/14/2011   LDLCALC 125 (H) 11/29/2021   ALT 19 05/19/2020   AST 14 05/19/2020   NA 134 (L) 11/29/2021   K 3.9 11/29/2021   CL 98 11/29/2021   CREATININE 1.24 11/29/2021   BUN 14 11/29/2021   CO2 27 11/29/2021   TSH 2.49 05/19/2020   PSA 2.45 01/28/2021   INR 0.97 09/08/2014   HGBA1C 6.1 11/29/2021    ECHO TEE  Result Date: 11/25/2020    TRANSESOPHOGEAL ECHO REPORT   Patient Name:   Jonathan [name] Date of Exam: 11/25/2020 Medical Rec #:  [id number]             Height:       73.0 in Accession #:    [id number]            Weight:       200.0 lb Date of Birth:  19-Jan-1956             BSA:          2.152 m Patient Age:    44 years              BP:           139/94 mmHg Patient Gender: M                     HR:           78 bpm. Exam Location:  Inpatient Procedure: Transesophageal Echo, 3D Echo, Color Doppler, Cardiac Doppler and            Saline Contrast Bubble Study Indications:     I34.1 Nonrheumatic mitral (valve) prolapse  History:         Patient has prior history of Echocardiogram examinations, most                  recent 11/06/2020. Risk Factors:Dyslipidemia.  Sonographer:     Raquel Sarna Senior RDCS Referring Phys:  [place] Diagnosing Phys: Eleonore Chiquito MD PROCEDURE: After discussion of the risks and benefits of a TEE, an informed consent was obtained from the patient. TEE procedure time was 35 minutes. The transesophogeal probe was passed without difficulty through the esophogus of the patient. Local oropharyngeal anesthetic was provided with Cetacaine. Sedation performed by different physician. The patient was monitored while under deep sedation. Anesthestetic sedation was provided intravenously by Anesthesiology: '447mg'$  of Propofol. Image quality was excellent. The patient's vital signs; including heart rate, blood pressure, and oxygen saturation; remained stable throughout the procedure. The patient developed no complications during the procedure.  IMPRESSIONS  1. There is severe prolapse of the P1-P2 segment. There is no flail segment. There is an eccentric anteriorly directed jet of mitral regurgitation. 2D PISA radius 1.3 cm, but 2D ERO 0.29 cm2 and R vol 39 cc after angle correction for the eccentric jet (67 degrees). 3D VCA is also consistent with moderate mitral regurgitation 0.31 cm2. There is a second central jet in the P2 region which could represent a cleft or related to the severe prolapse of the P2 segment. The pulmonary vein flow is systolic dominant in all 4 pulmonary veins. The LA size is normal. RVSP is normal. Overall, the findings are consistent with moderate mitral regurgitation. Would recommned to follow this clinically and with serial echocardiograms. The mitral valve is myxomatous. Moderate mitral valve regurgitation.  2. Left ventricular ejection fraction by 3D volume is 66 %. The left ventricle has normal function.  3. Right ventricular systolic function is normal. The right ventricular size is normal.  4. No left atrial/left atrial appendage thrombus was detected. The LAA emptying velocity was 61 cm/s.  5. The aortic valve is tricuspid. Aortic valve regurgitation is not visualized. No aortic stenosis is present.  6. Agitated saline contrast bubble study was negative, with no evidence of any interatrial shunt. FINDINGS  Left Ventricle: Left ventricular ejection fraction by 3D volume is 66 %. The left ventricle has normal function. The left ventricular internal cavity size was normal in size. Right Ventricle: The right ventricular size is normal. No increase in right ventricular wall thickness. Right ventricular systolic function is normal. Left Atrium: Left atrial size was normal in size. No left atrial/left atrial appendage thrombus was detected. The LAA emptying velocity was 61 cm/s. Right Atrium: Right atrial size was normal in size. Pericardium: There is no evidence of pericardial effusion. Mitral Valve: There is severe prolapse of  the P1-P2 segment. There is no flail segment. There is an eccentric anteriorly directed jet of mitral regurgitation. 2D PISA radius 1.3 cm, but 2D ERO 0.29 cm2 and R vol 39 cc after angle correction for the eccentric jet (67 degrees). 3D VCA is also consistent with moderate mitral regurgitation 0.31 cm2. There is a second central jet in the P2 region which could represent a cleft or related to the severe prolapse of the P2 segment. The pulmonary vein flow is systolic dominant in all 4 pulmonary veins. The LA size is normal. RVSP is normal. Overall, the findings are consistent with moderate mitral regurgitation. Would recommned to follow this clinically and with serial echocardiograms. The mitral valve is myxomatous. Moderate mitral valve regurgitation. Tricuspid Valve: The tricuspid valve is normal in structure. Tricuspid valve regurgitation is trivial. No evidence of tricuspid stenosis. Aortic Valve: The aortic valve is tricuspid. Aortic valve regurgitation is not visualized. No aortic stenosis is present. Pulmonic Valve: The pulmonic valve was normal in structure. Pulmonic valve regurgitation is trivial. No evidence of pulmonic stenosis. Aorta: The aortic root and ascending aorta are structurally normal, with no evidence of dilitation. There is minimal (Grade I) layered plaque involving the descending aorta. Venous: A normal flow pattern is recorded from the left upper pulmonary vein, the left lower pulmonary vein, the right upper pulmonary vein and the right lower pulmonary vein. IAS/Shunts: No atrial level shunt detected by color flow Doppler. Agitated saline contrast was given intravenously to evaluate for intracardiac shunting. Agitated saline contrast bubble study was negative, with no evidence of any interatrial shunt.  LEFT VENTRICLE PLAX 2D LVOT diam:     2.50 cm  LV SV:         94 LV SV Index:   44              3D Volume EF LVOT Area:     4.91 cm        LV 3D EF:    Left                                              ventricular                                             ejection                                             fraction by                                             3D volume                                             is 66 %.                                 3D Volume EF                                LV 3D EF:    65.50 %                                LV 3D EDV:   99100.00                                             mm                                LV 3D ESV:   34100.00                                             mm                                LV 3D SV:    64900.00  mm                                 3D Volume EF:                                3D EF:        66 % AORTIC VALVE LVOT Vmax:   110.00 cm/s LVOT Vmean:  56.800 cm/s LVOT VTI:    0.191 m  AORTA Ao Root diam: 3.50 cm Ao Asc diam:  2.79 cm MR Peak grad:    96.2 mmHg   TRICUSPID VALVE MR Mean grad:    47.5 mmHg   TR Peak grad:   6.5 mmHg MR Vmax:         490.50 cm/s TR Vmax:        127.00 cm/s MR Vmean:        321.0 cm/s MR PISA:         10.62 cm   SHUNTS MR PISA Eff ROA: 83 mm      Systemic VTI:  0.19 m MR PISA Radius:  1.30 cm     Systemic Diam: 2.50 cm Eleonore Chiquito MD Electronically signed by Eleonore Chiquito MD Signature Date/Time: 11/25/2020/1:03:28 PM    Final     Assessment & Plan:   Delane was seen today for hypertension and hyperlipidemia.  Diagnoses and all orders for this visit:  Essential hypertension- His blood pressure is overcontrolled and he has developed hypercalcemia.  Will discontinue the thiazide diuretic. -     Basic metabolic panel; Future -     CBC with Differential/Platelet; Future -     CBC with Differential/Platelet -     Basic metabolic panel  Gastroesophageal reflux disease with esophagitis, unspecified whether hemorrhage -     CBC with Differential/Platelet; Future -     CBC with Differential/Platelet  Dyslipidemia, goal LDL below 100- He has not  achieved his LDL goal.  I have asked him to be more compliant with the statin. -     Lipid panel; Future -     Lipid panel  Prediabetes -     Basic metabolic panel; Future -     Hemoglobin A1c; Future -     Hemoglobin A1c -     Basic metabolic panel  Hypercalcemia due to a drug- Will discontinue indapamide and will recheck in 3 months.   I have discontinued Jewel L. Turner [name]'s indapamide. I am also having him maintain his aspirin EC, dexlansoprazole, Azelastine-Fluticasone, dutasteride, Livalo, naloxone, and HYDROcodone-acetaminophen.  No orders of the defined types were placed in this encounter.    Follow-up: Return in about 3 months (around 03/01/2022).  Scarlette Calico, MD

## 2021-11-29 NOTE — Patient Instructions (Signed)
Hypertension, Adult High blood pressure (hypertension) is when the force of blood pumping through the arteries is too strong. The arteries are the blood vessels that carry blood from the heart throughout the body. Hypertension forces the heart to work harder to pump blood and may cause arteries to become narrow or stiff. Untreated or uncontrolled hypertension can lead to a heart attack, heart failure, a stroke, kidney disease, and other problems. A blood pressure reading consists of a higher number over a lower number. Ideally, your blood pressure should be below 120/80. The first ("top") number is called the systolic pressure. It is a measure of the pressure in your arteries as your heart beats. The second ("bottom") number is called the diastolic pressure. It is a measure of the pressure in your arteries as the heart relaxes. What are the causes? The exact cause of this condition is not known. There are some conditions that result in high blood pressure. What increases the risk? Certain factors may make you more likely to develop high blood pressure. Some of these risk factors are under your control, including: Smoking. Not getting enough exercise or physical activity. Being overweight. Having too much fat, sugar, calories, or salt (sodium) in your diet. Drinking too much alcohol. Other risk factors include: Having a personal history of heart disease, diabetes, high cholesterol, or kidney disease. Stress. Having a family history of high blood pressure and high cholesterol. Having obstructive sleep apnea. Age. The risk increases with age. What are the signs or symptoms? High blood pressure may not cause symptoms. Very high blood pressure (hypertensive crisis) may cause: Headache. Fast or irregular heartbeats (palpitations). Shortness of breath. Nosebleed. Nausea and vomiting. Vision changes. Severe chest pain, dizziness, and seizures. How is this diagnosed? This condition is diagnosed by  measuring your blood pressure while you are seated, with your arm resting on a flat surface, your legs uncrossed, and your feet flat on the floor. The cuff of the blood pressure monitor will be placed directly against the skin of your upper arm at the level of your heart. Blood pressure should be measured at least twice using the same arm. Certain conditions can cause a difference in blood pressure between your right and left arms. If you have a high blood pressure reading during one visit or you have normal blood pressure with other risk factors, you may be asked to: Return on a different day to have your blood pressure checked again. Monitor your blood pressure at home for 1 week or longer. If you are diagnosed with hypertension, you may have other blood or imaging tests to help your health care provider understand your overall risk for other conditions. How is this treated? This condition is treated by making healthy lifestyle changes, such as eating healthy foods, exercising more, and reducing your alcohol intake. You may be referred for counseling on a healthy diet and physical activity. Your health care provider may prescribe medicine if lifestyle changes are not enough to get your blood pressure under control and if: Your systolic blood pressure is above 130. Your diastolic blood pressure is above 80. Your personal target blood pressure may vary depending on your medical conditions, your age, and other factors. Follow these instructions at home: Eating and drinking  Eat a diet that is high in fiber and potassium, and low in sodium, added sugar, and fat. An example of this eating plan is called the DASH diet. DASH stands for Dietary Approaches to Stop Hypertension. To eat this way: Eat   plenty of fresh fruits and vegetables. Try to fill one half of your plate at each meal with fruits and vegetables. Eat whole grains, such as whole-wheat pasta, brown rice, or whole-grain bread. Fill about one  fourth of your plate with whole grains. Eat or drink low-fat dairy products, such as skim milk or low-fat yogurt. Avoid fatty cuts of meat, processed or cured meats, and poultry with skin. Fill about one fourth of your plate with lean proteins, such as fish, chicken without skin, beans, eggs, or tofu. Avoid pre-made and processed foods. These tend to be higher in sodium, added sugar, and fat. Reduce your daily sodium intake. Many people with hypertension should eat less than 1,500 mg of sodium a day. Do not drink alcohol if: Your health care provider tells you not to drink. You are pregnant, may be pregnant, or are planning to become pregnant. If you drink alcohol: Limit how much you have to: 0-1 drink a day for women. 0-2 drinks a day for men. Know how much alcohol is in your drink. In the [place] one drink equals one 12 oz bottle of beer (355 mL), one 5 oz glass of wine (148 mL), or one 1 oz glass of hard liquor (44 mL). Lifestyle  Work with your health care provider to maintain a healthy body weight or to lose weight. Ask what an ideal weight is for you. Get at least 30 minutes of exercise that causes your heart to beat faster (aerobic exercise) most days of the week. Activities may include walking, swimming, or biking. Include exercise to strengthen your muscles (resistance exercise), such as Pilates or lifting weights, as part of your weekly exercise routine. Try to do these types of exercises for 30 minutes at least 3 days a week. Do not use any products that contain nicotine or tobacco. These products include cigarettes, chewing tobacco, and vaping devices, such as e-cigarettes. If you need help quitting, ask your health care provider. Monitor your blood pressure at home as told by your health care provider. Keep all follow-up visits. This is important. Medicines Take over-the-counter and prescription medicines only as told by your health care provider. Follow directions carefully. Blood  pressure medicines must be taken as prescribed. Do not skip doses of blood pressure medicine. Doing this puts you at risk for problems and can make the medicine less effective. Ask your health care provider about side effects or reactions to medicines that you should watch for. Contact a health care provider if you: Think you are having a reaction to a medicine you are taking. Have headaches that keep coming back (recurring). Feel dizzy. Have swelling in your ankles. Have trouble with your vision. Get help right away if you: Develop a severe headache or confusion. Have unusual weakness or numbness. Feel faint. Have severe pain in your chest or abdomen. Vomit repeatedly. Have trouble breathing. These symptoms may be an emergency. Get help right away. Call 911. Do not wait to see if the symptoms will go away. Do not drive yourself to the hospital. Summary Hypertension is when the force of blood pumping through your arteries is too strong. If this condition is not controlled, it may put you at risk for serious complications. Your personal target blood pressure may vary depending on your medical conditions, your age, and other factors. For most people, a normal blood pressure is less than 120/80. Hypertension is treated with lifestyle changes, medicines, or a combination of both. Lifestyle changes include losing weight, eating a healthy,   low-sodium diet, exercising more, and limiting alcohol. This information is not intended to replace advice given to you by your health care provider. Make sure you discuss any questions you have with your health care provider. Document Revised: 03/23/2021 Document Reviewed: 03/23/2021 Elsevier Patient Education  2023 [employer]  

## 2021-12-01 ENCOUNTER — Telehealth: Payer: Self-pay | Admitting: Internal Medicine

## 2021-12-01 ENCOUNTER — Other Ambulatory Visit: Payer: Self-pay | Admitting: Nurse Practitioner

## 2021-12-01 DIAGNOSIS — K2100 Gastro-esophageal reflux disease with esophagitis, without bleeding: Secondary | ICD-10-CM

## 2021-12-01 NOTE — Telephone Encounter (Signed)
Pt is requesting a callback to discuss his lab results. He stated his WBC is low and his potassium. He also stated he would like an antibiotic. He said he has been trying to get an antibiotic for about a year now to clear up his drainage at night.   Please advise   CB: [phone]

## 2021-12-02 ENCOUNTER — Other Ambulatory Visit: Payer: Self-pay | Admitting: Internal Medicine

## 2021-12-02 DIAGNOSIS — J324 Chronic pansinusitis: Secondary | ICD-10-CM | POA: Insufficient documentation

## 2021-12-02 NOTE — Telephone Encounter (Signed)
Pt has been informed that referral to specialist was entered and that abx ws not prescribed.

## 2021-12-11 ENCOUNTER — Observation Stay (HOSPITAL_COMMUNITY)
Admission: EM | Admit: 2021-12-11 | Discharge: 2021-12-12 | Disposition: A | Discharge status: Home / Self Care | Payer: Medicare Other | Attending: Cardiology | Admitting: Cardiology

## 2021-12-11 ENCOUNTER — Emergency Department (HOSPITAL_COMMUNITY): Payer: Medicare Other

## 2021-12-11 ENCOUNTER — Other Ambulatory Visit: Payer: Self-pay

## 2021-12-11 ENCOUNTER — Encounter (HOSPITAL_COMMUNITY): Payer: Self-pay

## 2021-12-11 DIAGNOSIS — F17290 Nicotine dependence, other tobacco product, uncomplicated: Secondary | ICD-10-CM | POA: Insufficient documentation

## 2021-12-11 DIAGNOSIS — R001 Bradycardia, unspecified: Principal | ICD-10-CM | POA: Diagnosis present

## 2021-12-11 DIAGNOSIS — Z79899 Other long term (current) drug therapy: Secondary | ICD-10-CM | POA: Diagnosis not present

## 2021-12-11 DIAGNOSIS — I110 Hypertensive heart disease with heart failure: Secondary | ICD-10-CM | POA: Diagnosis not present

## 2021-12-11 DIAGNOSIS — Z7982 Long term (current) use of aspirin: Secondary | ICD-10-CM | POA: Diagnosis not present

## 2021-12-11 DIAGNOSIS — I10 Essential (primary) hypertension: Secondary | ICD-10-CM | POA: Diagnosis present

## 2021-12-11 DIAGNOSIS — I340 Nonrheumatic mitral (valve) insufficiency: Secondary | ICD-10-CM | POA: Diagnosis not present

## 2021-12-11 DIAGNOSIS — I5032 Chronic diastolic (congestive) heart failure: Secondary | ICD-10-CM | POA: Insufficient documentation

## 2021-12-11 DIAGNOSIS — Z96643 Presence of artificial hip joint, bilateral: Secondary | ICD-10-CM | POA: Diagnosis not present

## 2021-12-11 DIAGNOSIS — F17210 Nicotine dependence, cigarettes, uncomplicated: Secondary | ICD-10-CM | POA: Insufficient documentation

## 2021-12-11 LAB — URINALYSIS, ROUTINE W REFLEX MICROSCOPIC
Bilirubin Urine: NEGATIVE
Glucose, UA: NEGATIVE mg/dL
Hgb urine dipstick: NEGATIVE
Ketones, ur: NEGATIVE mg/dL
Leukocytes,Ua: NEGATIVE
Nitrite: NEGATIVE
Protein, ur: NEGATIVE mg/dL
Specific Gravity, Urine: 1.013 (ref 1.005–1.030)
pH: 7 (ref 5.0–8.0)

## 2021-12-11 LAB — CBC
HCT: 44.2 % (ref 39.0–52.0)
Hemoglobin: 14.9 g/dL (ref 13.0–17.0)
MCH: 28.5 pg (ref 26.0–34.0)
MCHC: 33.7 g/dL (ref 30.0–36.0)
MCV: 84.5 fL (ref 80.0–100.0)
Platelets: 219 10*3/uL (ref 150–400)
RBC: 5.23 MIL/uL (ref 4.22–5.81)
RDW: 14 % (ref 11.5–15.5)
WBC: 3.4 10*3/uL — ABNORMAL LOW (ref 4.0–10.5)
nRBC: 0 % (ref 0.0–0.2)

## 2021-12-11 LAB — BASIC METABOLIC PANEL
Anion gap: 16 — ABNORMAL HIGH (ref 5–15)
BUN: 8 mg/dL (ref 8–23)
CO2: 16 mmol/L — ABNORMAL LOW (ref 22–32)
Calcium: 9.3 mg/dL (ref 8.9–10.3)
Chloride: 108 mmol/L (ref 98–111)
Creatinine, Ser: 0.9 mg/dL (ref 0.61–1.24)
GFR, Estimated: >60 mL/min (ref >60)
Glucose, Bld: 88 mg/dL (ref 70–99)
Potassium: 3.9 mmol/L (ref 3.5–5.1)
Sodium: 140 mmol/L (ref 135–145)

## 2021-12-11 LAB — TROPONIN I (HIGH SENSITIVITY)
Troponin I (High Sensitivity): 11 ng/L (ref <18)
Troponin I (High Sensitivity): 14 ng/L (ref <18)

## 2021-12-11 MED ORDER — SALINE SPRAY 0.65 % NA SOLN
1.0000 | Freq: Once | NASAL | Status: AC
  Administered 2021-12-11: 1 via NASAL
  Filled 2021-12-11: qty 44

## 2021-12-11 MED ORDER — PRAVASTATIN SODIUM 10 MG PO TABS
20.0000 mg | ORAL_TABLET | Freq: Every day | ORAL | Status: DC
  Administered 2021-12-11: 20 mg via ORAL
  Filled 2021-12-11: qty 2

## 2021-12-11 MED ORDER — PANTOPRAZOLE SODIUM 40 MG PO TBEC
40.0000 mg | DELAYED_RELEASE_TABLET | Freq: Every day | ORAL | Status: DC
  Administered 2021-12-11 – 2021-12-12 (×2): 40 mg via ORAL
  Filled 2021-12-11 (×2): qty 1

## 2021-12-11 MED ORDER — LORATADINE 10 MG PO TABS
10.0000 mg | ORAL_TABLET | Freq: Once | ORAL | Status: AC
  Administered 2021-12-11: 10 mg via ORAL
  Filled 2021-12-11: qty 1

## 2021-12-11 MED ORDER — DUTASTERIDE 0.5 MG PO CAPS
0.5000 mg | ORAL_CAPSULE | Freq: Every day | ORAL | Status: DC
  Administered 2021-12-11 – 2021-12-12 (×2): 0.5 mg via ORAL
  Filled 2021-12-11 (×3): qty 1

## 2021-12-11 MED ORDER — AZELASTINE HCL 0.1 % NA SOLN
1.0000 | Freq: Every day | NASAL | Status: DC
  Filled 2021-12-11: qty 30

## 2021-12-11 MED ORDER — HYDROCODONE-ACETAMINOPHEN 10-325 MG PO TABS: 1.0000 | ORAL_TABLET | Freq: Four times a day (QID) | ORAL | Status: DC | PRN

## 2021-12-11 MED ORDER — AMLODIPINE BESYLATE 5 MG PO TABS
5.0000 mg | ORAL_TABLET | Freq: Every day | ORAL | Status: DC
  Administered 2021-12-11 – 2021-12-12 (×2): 5 mg via ORAL
  Filled 2021-12-11 (×2): qty 1

## 2021-12-11 MED ORDER — ACETAMINOPHEN 325 MG PO TABS: 650.0000 mg | ORAL_TABLET | ORAL | Status: DC | PRN

## 2021-12-11 MED ORDER — ASPIRIN 81 MG PO TBEC
81.0000 mg | DELAYED_RELEASE_TABLET | Freq: Every day | ORAL | Status: DC
  Administered 2021-12-11 – 2021-12-12 (×2): 81 mg via ORAL
  Filled 2021-12-11 (×2): qty 1

## 2021-12-11 MED ORDER — ENOXAPARIN SODIUM 40 MG/0.4ML IJ SOSY
40.0000 mg | PREFILLED_SYRINGE | INTRAMUSCULAR | Status: DC
  Administered 2021-12-12: 40 mg via SUBCUTANEOUS
  Filled 2021-12-11: qty 0.4

## 2021-12-11 MED ORDER — FLUTICASONE PROPIONATE 50 MCG/ACT NA SUSP
1.0000 | Freq: Every day | NASAL | Status: DC
  Filled 2021-12-11: qty 16

## 2021-12-11 MED ORDER — NITROGLYCERIN 0.4 MG SL SUBL: 0.4000 mg | SUBLINGUAL_TABLET | SUBLINGUAL | Status: DC | PRN

## 2021-12-11 MED ORDER — AZELASTINE-FLUTICASONE 137-50 MCG/ACT NA SUSP: 2.0000 | Freq: Every day | NASAL | Status: DC

## 2021-12-11 MED ORDER — ONDANSETRON HCL 4 MG/2ML IJ SOLN: 4.0000 mg | Freq: Four times a day (QID) | INTRAMUSCULAR | Status: DC | PRN

## 2021-12-11 NOTE — ED Triage Notes (Signed)
Pt arrived POV from home c/o hypertension and bradycardia for several days. Pt states he use to take blood pressure medicine but his doctor took him off.

## 2021-12-11 NOTE — ED Provider Notes (Signed)
[facility] EMERGENCY DEPARTMENT Provider Note   CSN: [id number] Arrival date & time: 12/11/21  1101     History  Chief Complaint  Patient presents with   Hypertension   Bradycardia    Jonathan [name] is a 66 y.o. male.  Pt is a 66 yo male with a pmhx significant for htn, chronic back pain, GERD, CHF, moderate to severe MR (seen on ECHO in June), bradycardia and right eye blindness.  Pt saw his pcp on 7/3 and d/c'd his indapamide due to hypercalcemia.  He continued his "dexlansoprazole, Azelastine-Fluticasone, dutasteride, Livalo, naloxone, and HYDROcodone-acetaminophen."  Pt's wife said she was checking her HR and decided to check pt's HR as well.  Pt's HR was in the 30s, so she brought him to the ED.  Pt said he feels fine.  Pt has a hx of bradycardia, but HR has usually been in the 50s.  As he's been tolerating this, nothing has been done.  Pt is scheduled to see Dr. Percival Spanish again in August.  Pt also c/o URI sx for a year.  He wants abx.       Home Medications Prior to Admission medications   Medication Sig Start Date End Date Taking? Authorizing Provider  aspirin EC 81 MG tablet Take 81 mg by mouth in the morning and at bedtime. Swallow whole.   Yes [provider]  Azelastine-Fluticasone 137-50 MCG/ACT SUSP INSTILL 2 SPRAYS IN EACH NOSTRIL TWICE A DAY Patient taking differently: Place 2 sprays into the nose at bedtime. 01/18/21  Yes Janith Lima, MD  dexlansoprazole (DEXILANT) 60 MG capsule Take 1 capsule (60 mg total) by mouth daily. **PLEASE CALL OFFICE TO SCHEDULE FOLLOW UP 12/02/21  Yes Noralyn Pick, NP  dutasteride (AVODART) 0.5 MG capsule Take 1 capsule (0.5 mg total) by mouth daily. 01/28/21  Yes Janith Lima, MD  HYDROcodone-acetaminophen (NORCO) 10-325 MG tablet Take 1 tablet by mouth every 6 (six) hours as needed for moderate pain. 11/16/21  Yes Janith Lima, MD  naloxone [facility]) nasal spray 4 mg/0.1 mL Place 1 spray  into the nose as needed (opioid overdose). 08/16/21  Yes Janith Lima, MD  Pitavastatin Calcium (LIVALO) 4 MG TABS Take 1 tablet (4 mg total) by mouth daily. 11/29/21  Yes Janith Lima, MD      Allergies    Cortisone and Lipitor [atorvastatin calcium]    Review of Systems   Review of Systems  Cardiovascular:        Low HR  All other systems reviewed and are negative.   Physical Exam Updated Vital Signs BP (!) 155/90   Pulse (!) 34   Temp 98.2 F (36.8 C) (Oral)   Resp 15   Ht 6' 1.5" (1.867 m)   Wt 92.5 kg   SpO2 99%   BMI 26.55 kg/m  Physical Exam Vitals and nursing note reviewed.  Constitutional:      Appearance: Normal appearance.  HENT:     Head: Normocephalic and atraumatic.     Right Ear: External ear normal.     Left Ear: External ear normal.     Nose: Nose normal.     Mouth/Throat:     Mouth: Mucous membranes are moist.     Pharynx: Oropharynx is clear.  Eyes:     Extraocular Movements: Extraocular movements intact.     Conjunctiva/sclera: Conjunctivae normal.     Comments: Right eye blind  Cardiovascular:     Rate and  Rhythm: Regular rhythm. Bradycardia present.     Pulses: Normal pulses.     Heart sounds: Normal heart sounds.  Pulmonary:     Effort: Pulmonary effort is normal.     Breath sounds: Normal breath sounds.  Abdominal:     General: Abdomen is flat. Bowel sounds are normal.     Palpations: Abdomen is soft.  Musculoskeletal:        General: Normal range of motion.     Cervical back: Normal range of motion and neck supple.  Skin:    General: Skin is warm.     Capillary Refill: Capillary refill takes less than 2 seconds.  Neurological:     General: No focal deficit present.     Mental Status: He is alert and oriented to person, place, and time.  Psychiatric:        Mood and Affect: Mood normal.        Behavior: Behavior normal.     ED Results / Procedures / Treatments   Labs (all labs ordered are listed, but only abnormal results  are displayed) Labs Reviewed  BASIC METABOLIC PANEL - Abnormal; Notable for the following components:      Result Value   CO2 16 (*)    Anion gap 16 (*)    All other components within normal limits  CBC - Abnormal; Notable for the following components:   WBC 3.4 (*)    All other components within normal limits  URINALYSIS, ROUTINE W REFLEX MICROSCOPIC  TROPONIN I (HIGH SENSITIVITY)  TROPONIN I (HIGH SENSITIVITY)    EKG EKG Interpretation  Date/Time:  Saturday December 11 2021 11:46:47 EDT Ventricular Rate:  34 PR Interval:  204 QRS Duration: 98 QT Interval:  474 QTC Calculation: 356 R Axis:   73 Text Interpretation: Marked sinus bradycardia Anteroseptal infarct , age undetermined Abnormal ECG When compared with ECG of 15-May-2018 07:23, PREVIOUS ECG IS PRESENT Since last tracing rate slower Confirmed by Isla Pence [phone]) on 12/11/2021 12:07:03 PM  Radiology DG Chest Port 1 View  Result Date: 12/11/2021 CLINICAL DATA:  Bradycardia. EXAM: PORTABLE CHEST 1 VIEW COMPARISON:  None Available. FINDINGS: UPPER limits normal heart size noted. There is no evidence of focal airspace disease, pulmonary edema, suspicious pulmonary nodule/mass, pleural effusion, or pneumothorax. No acute bony abnormalities are identified. IMPRESSION: No active disease. Electronically Signed   By: Margarette Canada M.D.   On: 12/11/2021 12:53    Procedures Procedures    Medications Ordered in ED Medications  sodium chloride (OCEAN) 0.65 % nasal spray 1 spray (has no administration in time range)  loratadine (CLARITIN) tablet 10 mg (has no administration in time range)    ED Course/ Medical Decision Making/ A&P                           Medical Decision Making Amount and/or Complexity of Data Reviewed Labs: ordered. Radiology: ordered.  Risk Decision regarding hospitalization.   This patient presents to the ED for concern of bradycardia, this involves an extensive number of treatment options, and is  a complaint that carries with it a high risk of complications and morbidity.  The differential diagnosis includes electrolyte abn, mi, infection   Co morbidities that complicate the patient evaluation  htn, chronic back pain, GERD, CHF, moderate to severe MR (seen on ECHO in June), bradycardia and right eye blindness   Additional history obtained:  Additional history obtained from epic chart review External records from  outside source obtained and reviewed including wife   Lab Tests:  I Ordered, and personally interpreted labs.  The pertinent results include:  cbc nl other than wbc slightly low at 3.4, bmp nl other than co2 low at 16; ua neg   Imaging Studies ordered:  I ordered imaging studies including cxr  I independently visualized and interpreted imaging which showed  IMPRESSION:  No active disease.   I agree with the radiologist interpretation   Cardiac Monitoring:  The patient was maintained on a cardiac monitor.  I personally viewed and interpreted the cardiac monitored which showed an underlying rhythm of: sinus bradycardia   Medicines ordered and prescription drug management:  I have reviewed the patients home medicines and have made adjustments as needed   Consultations Obtained:  I requested consultation with the cardiologist (Dr. Harl Bowie),  and discussed lab and imaging findings as well as pertinent plan - he will admit  Problem List / ED Course:  Sinus bradycardia:  pt is not on any meds which would cause bradycardia.  Pt d/w Cards who will admit for obs. URI sx:  pt does not need abx.  He will be treated with saline spray and claritin.     Reevaluation:  After the interventions noted above, I reevaluated the patient and found that they have :stayed the same   Social Determinants of Health:  Lives at home   Dispostion:  After consideration of the diagnostic results and the patients response to treatment, I feel that the patent would benefit  from admission.    CRITICAL CARE Performed by: Isla Pence   Total critical care time: 30 minutes  Critical care time was exclusive of separately billable procedures and treating other patients.  Critical care was necessary to treat or prevent imminent or life-threatening deterioration.  Critical care was time spent personally by me on the following activities: development of treatment plan with patient and/or surrogate as well as nursing, discussions with consultants, evaluation of patient's response to treatment, examination of patient, obtaining history from patient or surrogate, ordering and performing treatments and interventions, ordering and review of laboratory studies, ordering and review of radiographic studies, pulse oximetry and re-evaluation of patient's condition.         Final Clinical Impression(s) / ED Diagnoses Final diagnoses:  Bradycardia    Rx / DC Orders ED Discharge Orders     None         Isla Pence, MD 12/11/21 1451

## 2021-12-11 NOTE — Progress Notes (Signed)
Pt admitted to [facility], VS as per flow. SB w/ elevated BP. Pt oriented to 6E processes. Pt familiar with the [facility] system. Pt placed on portable telemetry. No c/o pain or SOB/dizziness. All questions and concerns addressed. Call bell placed within reach, will continue to monitor and maintain safety.

## 2021-12-11 NOTE — H&P (Signed)
Cardiology Admission History and Physical:   Patient ID: Jonathan [name] MRN: [id number]; DOB: 1956/03/16   Admission date: 12/11/2021  PCP:  Janith Lima, MD   [facility] Providers Cardiologist:  Minus Breeding, MD   {    Chief Complaint:  Bradycardia  Patient Profile:   Jonathan [name] is a 66 y.o. male with f chronic diastolic HF, HTN, chronic bradycardia, moderate MR, HL  who is being seen 12/11/2021 for the evaluation of bradycardia.  History of Present Illness:   Jonathan [name] 66 yo male history of chronic diastolic HF, HTN, chronic bradycardia, moderate MR, HL presents from home with high blood pressure and low heart rates on home vitals checks. Home heart rates reported in 30s, patient denies any specific lightheadedness or dizziness. Chronc sinus brady typically 40s-50s from chart review, rates appear lower than prior baseline.    K 3.9 Cr 0.90 WBC 3.4 Hgb 14.9 Plt 219 TSH pending Trop neg x 2 CXR no acute process EKG sinus brady 34  Past Medical History:  Diagnosis Date   Anxiety    Arthritis    Avascular necrosis of bone (HCC)    left hip    Back pain, lumbosacral 2000   Blind right eye    Colon polyps 08/29/2011   Hyperplastic colon polyps   Mitral valve prolapse     Past Surgical History:  Procedure Laterality Date   BUBBLE STUDY  11/25/2020   Procedure: BUBBLE STUDY;  Surgeon: Geralynn Rile, MD;  Location: [facility];  Service: Cardiovascular;;   EYE SURGERY  2007   Fractured bones repaired/blind in right eye   HERNIA REPAIR     Right inguinal   SPINE SURGERY     TEE WITHOUT CARDIOVERSION N/A 11/25/2020   Procedure: TRANSESOPHAGEAL ECHOCARDIOGRAM (TEE);  Surgeon: Geralynn Rile, MD;  Location: [facility];  Service: Cardiovascular;  Laterality: N/A;   TOTAL HIP ARTHROPLASTY Right 09/18/2014   Procedure: RIGHT TOTAL HIP ARTHROPLASTY ANTERIOR APPROACH;  Surgeon: Rod Can, MD;  Location: [facility];  Service:  Orthopedics;  Laterality: Right;   TOTAL HIP ARTHROPLASTY Left 04/06/2017   Procedure: LEFT TOTAL HIP ARTHROPLASTY ANTERIOR APPROACH;  Surgeon: Rod Can, MD;  Location: [facility];  Service: Orthopedics;  Laterality: Left;  Needs RNFA     Medications Prior to Admission: Prior to Admission medications   Medication Sig Start Date End Date Taking? Authorizing Provider  aspirin EC 81 MG tablet Take 81 mg by mouth in the morning and at bedtime. Swallow whole.   Yes [provider]  Azelastine-Fluticasone 137-50 MCG/ACT SUSP INSTILL 2 SPRAYS IN EACH NOSTRIL TWICE A DAY Patient taking differently: Place 2 sprays into the nose at bedtime. 01/18/21  Yes Janith Lima, MD  dexlansoprazole (DEXILANT) 60 MG capsule Take 1 capsule (60 mg total) by mouth daily. **PLEASE CALL OFFICE TO SCHEDULE FOLLOW UP 12/02/21  Yes Noralyn Pick, NP  dutasteride (AVODART) 0.5 MG capsule Take 1 capsule (0.5 mg total) by mouth daily. 01/28/21  Yes Janith Lima, MD  HYDROcodone-acetaminophen (NORCO) 10-325 MG tablet Take 1 tablet by mouth every 6 (six) hours as needed for moderate pain. 11/16/21  Yes Janith Lima, MD  naloxone [facility]) nasal spray 4 mg/0.1 mL Place 1 spray into the nose as needed (opioid overdose). 08/16/21  Yes Janith Lima, MD  Pitavastatin Calcium (LIVALO) 4 MG TABS Take 1 tablet (4 mg total) by mouth daily. 11/29/21  Yes Janith Lima, MD  Allergies:    Allergies  Allergen Reactions   Cortisone Rash    "act different"   Lipitor [Atorvastatin Calcium] Other (See Comments)    "side pain"    Social History:   Social History   Socioeconomic History   Marital status: Divorced    Spouse name: Not on file   Number of children: 1   Years of education: Not on file   Highest education level: Not on file  Occupational History    Employer: DISABLED  Tobacco Use   Smoking status: Some Days    Packs/day: 0.10    Years: 30.00    Total pack years: 3.00    Types:  Cigarettes, Cigars   Smokeless tobacco: Never  Vaping Use   Vaping Use: Never used  Substance and Sexual Activity   Alcohol use: No   Drug use: Yes    Types: Marijuana    Comment: 1-2 a week   Sexual activity: Not on file  Other Topics Concern   Not on file  Social History Narrative   Lives with wife.   Social Determinants of Health   Financial Resource Strain: Low Risk  (12/25/2019)   Overall Financial Resource Strain (CARDIA)    Difficulty of Paying Living Expenses: Not hard at all  Food Insecurity: Not on file  Transportation Needs: Not on file  Physical Activity: Not on file  Stress: Not on file  Social Connections: Not on file  Intimate Partner Violence: Not on file    Family History:   The patient's family history includes Arthritis in his mother; Hypertension in his father; Stroke in his father and mother. There is no history of Cancer, COPD, Heart disease, or Kidney disease.    ROS:  Please see the history of present illness.  All other ROS reviewed and negative.     Physical Exam/Data:   Vitals:   12/11/21 1230 12/11/21 1315 12/11/21 1330 12/11/21 1345  BP: (!) 157/88 (!) 155/87 (!) 149/90 (!) 155/90  Pulse:  60 (!) 29 (!) 34  Resp: '16 13 15 15  '$ Temp:      TempSrc:      SpO2:  98% 97% 99%  Weight:      Height:       No intake or output data in the 24 hours ending 12/11/21 1458    12/11/2021   11:42 AM 11/29/2021    9:35 AM 07/26/2021   12:12 PM  Last 3 Weights  Weight (lbs) 204 lb 203 lb 205 lb 6.4 oz  Weight (kg) 92.534 kg 92.08 kg 93.169 kg     Body mass index is 26.55 kg/m.  General:  Well nourished, well developed, in no acute distress HEENT: normal Neck: no JVD Vascular: No carotid bruits; Distal pulses 2+ bilaterally   Cardiac:  regular, brady, 2/6 systolic murmur apex Lungs:  clear to auscultation bilaterally, no wheezing, rhonchi or rales  Abd: soft, nontender, no hepatomegaly  Ext: no edema Musculoskeletal:  No deformities, BUE and BLE  strength normal and equal Skin: warm and dry  Neuro:  CNs 2-12 intact, no focal abnormalities noted Psych:  Normal affect     Laboratory Data:  High Sensitivity Troponin:   Recent Labs  Lab 12/11/21 1226 12/11/21 1359  TROPONINIHS 11 14      Chemistry Recent Labs  Lab 12/11/21 1226  NA 140  K 3.9  CL 108  CO2 16*  GLUCOSE 88  BUN 8  CREATININE 0.90  CALCIUM 9.3  GFRNONAA >60  ANIONGAP 16*    No results for input(s): "PROT", "ALBUMIN", "AST", "ALT", "ALKPHOS", "BILITOT" in the last 168 hours. Lipids No results for input(s): "CHOL", "TRIG", "HDL", "LABVLDL", "LDLCALC", "CHOLHDL" in the last 168 hours. Hematology Recent Labs  Lab 12/11/21 1226  WBC 3.4*  RBC 5.23  HGB 14.9  HCT 44.2  MCV 84.5  MCH 28.5  MCHC 33.7  RDW 14.0  PLT 219   Thyroid No results for input(s): "TSH", "FREET4" in the last 168 hours. BNPNo results for input(s): "BNP", "PROBNP" in the last 168 hours.  DDimer No results for input(s): "DDIMER" in the last 168 hours.   Radiology/Studies:  DG Chest Port 1 View  Result Date: 12/11/2021 CLINICAL DATA:  Bradycardia. EXAM: PORTABLE CHEST 1 VIEW COMPARISON:  None Available. FINDINGS: UPPER limits normal heart size noted. There is no evidence of focal airspace disease, pulmonary edema, suspicious pulmonary nodule/mass, pleural effusion, or pneumothorax. No acute bony abnormalities are identified. IMPRESSION: No active disease. Electronically Signed   By: Margarette Canada M.D.   On: 12/11/2021 12:53     Assessment and Plan:   1.Bradycardia - long history of chronic sinus bradycardia 40s to 50s that has been historically asymptomatic and just monitored - presents with worsening sinus brady as low as 29 in ER.  COntinues to deny any symptoms, he is actually hypertensive - he is not on any av nodal agents, TSH and Mg pending - EP eval in AM. Marked sinus brady though asymptomatic would appear only a matter of time before potential signifcant symptoms do  develop. Without symptoms and hypertensive no indication for chronotropic drip of temporary pacing.  - npo at midnight for EP eval  2. HTN - pcp stopped indapamide due to high Ca - start norvasc '5mg'$  daily.    3. Chronic diastolic HF - 01/6044 echo LVE 60-65%, no WMAs, indet diastolic, normal RV, mod to severe MR - appears euvolemic.   4. Mitral regurgitation 10/2020 TEE: moderate MR - 10/2021 echo LVE 60-65%, mod to severe MR. LVIDs 2.7 cm - no symptoms - would just require clinical monitoring at this time   For questions or updates, please contact [place] Please consult [email or web address] for contact info under     Signed, Carlyle Dolly, MD  12/11/2021 2:58 PM

## 2021-12-12 DIAGNOSIS — I5032 Chronic diastolic (congestive) heart failure: Secondary | ICD-10-CM | POA: Diagnosis not present

## 2021-12-12 DIAGNOSIS — I495 Sick sinus syndrome: Secondary | ICD-10-CM | POA: Diagnosis not present

## 2021-12-12 DIAGNOSIS — I10 Essential (primary) hypertension: Secondary | ICD-10-CM | POA: Diagnosis not present

## 2021-12-12 DIAGNOSIS — R001 Bradycardia, unspecified: Secondary | ICD-10-CM | POA: Diagnosis not present

## 2021-12-12 DIAGNOSIS — I110 Hypertensive heart disease with heart failure: Secondary | ICD-10-CM | POA: Diagnosis not present

## 2021-12-12 DIAGNOSIS — Z96643 Presence of artificial hip joint, bilateral: Secondary | ICD-10-CM | POA: Diagnosis not present

## 2021-12-12 LAB — BASIC METABOLIC PANEL
Anion gap: 7 (ref 5–15)
BUN: 11 mg/dL (ref 8–23)
CO2: 22 mmol/L (ref 22–32)
Calcium: 8.8 mg/dL — ABNORMAL LOW (ref 8.9–10.3)
Chloride: 106 mmol/L (ref 98–111)
Creatinine, Ser: 0.92 mg/dL (ref 0.61–1.24)
GFR, Estimated: >60 mL/min (ref >60)
Glucose, Bld: 101 mg/dL — ABNORMAL HIGH (ref 70–99)
Potassium: 3.7 mmol/L (ref 3.5–5.1)
Sodium: 135 mmol/L (ref 135–145)

## 2021-12-12 LAB — CBC
HCT: 42.1 % (ref 39.0–52.0)
Hemoglobin: 15 g/dL (ref 13.0–17.0)
MCH: 28.2 pg (ref 26.0–34.0)
MCHC: 35.6 g/dL (ref 30.0–36.0)
MCV: 79.1 fL — ABNORMAL LOW (ref 80.0–100.0)
Platelets: 221 10*3/uL (ref 150–400)
RBC: 5.32 MIL/uL (ref 4.22–5.81)
RDW: 13.6 % (ref 11.5–15.5)
WBC: 4.3 10*3/uL (ref 4.0–10.5)
nRBC: 0 % (ref 0.0–0.2)

## 2021-12-12 LAB — MAGNESIUM: Magnesium: 2.1 mg/dL (ref 1.7–2.4)

## 2021-12-12 LAB — HIV ANTIBODY (ROUTINE TESTING W REFLEX): HIV Screen 4th Generation wRfx: NONREACTIVE

## 2021-12-12 LAB — TSH: TSH: 1.607 u[IU]/mL (ref 0.350–4.500)

## 2021-12-12 MED ORDER — AMLODIPINE BESYLATE 5 MG PO TABS: 5.0000 mg | ORAL_TABLET | Freq: Every day | ORAL | 1 refills | Status: DC

## 2021-12-12 NOTE — Discharge Summary (Cosign Needed)
Discharge Summary    Patient ID: Jonathan [name] MRN: [id number]; DOB: Nov 18, 1955  Admit date: 12/11/2021 Discharge date: 12/12/2021  PCP:  Janith Lima, MD   [facility] Providers Cardiologist:  Minus Breeding, MD        Discharge Diagnoses    Principal Problem:   Bradycardia Active Problems:   Essential hypertension   Nonrheumatic mitral valve regurgitation  Diagnostic Studies/Procedures    None Performed.    History of Present Illness     Jonathan [name] is a 66 y.o. male with past medical history of HFpEF, HTN, HLD, mitral regurgitation and chronic bradycardia who presented to [facility] ED on 12/11/2021 for evaluation of bradycardia.   On the day of his presentation, his wife had been checking his heart rate at home and noted that it was in the 30's. It had peaked into the 50's at home and he denied any associated lightheadedness, dizziness or presyncope. His EKG did show sinus bradycardia, heart rate 34 with no acute ST changes. Hs troponin values were negative. Electrolytes and TSH were within normal limits. He was not on any AV nodal blocking agents and was admitted for further evaluation with plans for EP consult the following morning.  Hospital Course     Consultants: Electrophysiology   The following morning, he reported overall feeling well and denied any symptoms with his bradycardia. He was evaluated by Dr. Lovena Le with EP who did not recommend PPM placement at this time given his asymptomatic state. His bradycardia was felt to be secondary to sinus node dysfunction as his heart rate improved to the 60's when he would sit in bed. This admission, he was started on Amlodipine 5 mg daily due to elevated BP and will need be followed as an outpatient. He was discharged home in good condition. He has previously scheduled follow-up next month and will keep the appointment with Dr. Percival Spanish. Reviewed with Dr. Lovena Le that he can follow-up with EP as needed.     Did the patient have an acute coronary syndrome (MI, NSTEMI, STEMI, etc) this admission?:  No                               Did the patient have a percutaneous coronary intervention (stent / angioplasty)?:  No.    _____________  Discharge Vitals Blood pressure 140/85, pulse (!) 32, temperature (!) 97.5 F (36.4 C), temperature source Oral, resp. rate 16, height 6' 1.5" (1.867 m), weight 92.5 kg, SpO2 98 %.  Filed Weights   12/11/21 1142  Weight: 92.5 kg    Labs & Radiologic Studies    CBC Recent Labs    12/11/21 1226 12/12/21 0356  WBC 3.4* 4.3  HGB 14.9 15.0  HCT 44.2 42.1  MCV 84.5 79.1*  PLT 219 500   Basic Metabolic Panel Recent Labs    12/11/21 1226 12/12/21 0356  NA 140 135  K 3.9 3.7  CL 108 106  CO2 16* 22  GLUCOSE 88 101*  BUN 8 11  CREATININE 0.90 0.92  CALCIUM 9.3 8.8*  MG  --  2.1   Liver Function Tests No results for input(s): "AST", "ALT", "ALKPHOS", "BILITOT", "PROT", "ALBUMIN" in the last 72 hours. No results for input(s): "LIPASE", "AMYLASE" in the last 72 hours. High Sensitivity Troponin:   Recent Labs  Lab 12/11/21 1226 12/11/21 1359  TROPONINIHS 11 14    BNP Invalid input(s): "POCBNP" D-Dimer No  results for input(s): "DDIMER" in the last 72 hours. Hemoglobin A1C No results for input(s): "HGBA1C" in the last 72 hours. Fasting Lipid Panel No results for input(s): "CHOL", "HDL", "LDLCALC", "TRIG", "CHOLHDL", "LDLDIRECT" in the last 72 hours. Thyroid Function Tests Recent Labs    12/12/21 0356  TSH 1.607   _____________  DG Chest Port 1 View  Result Date: 12/11/2021 CLINICAL DATA:  Bradycardia. EXAM: PORTABLE CHEST 1 VIEW COMPARISON:  None Available. FINDINGS: UPPER limits normal heart size noted. There is no evidence of focal airspace disease, pulmonary edema, suspicious pulmonary nodule/mass, pleural effusion, or pneumothorax. No acute bony abnormalities are identified. IMPRESSION: No active disease. Electronically Signed    By: Margarette Canada M.D.   On: 12/11/2021 12:53    Disposition   Pt is being discharged home today in good condition.  Follow-up Plans & Appointments     Follow-up Information     Minus Breeding, MD Follow up on 01/24/2022.   Specialty: Cardiology Why: Keep scheduled Cardiology Follow-up on 01/24/2022 at 7:40 AM. Contact information: [address] [place] [address] [phone]                  Discharge Medications   Allergies as of 12/12/2021       Reactions   Cortisone Rash   "act different"   Lipitor [atorvastatin Calcium] Other (See Comments)   "side pain"        Medication List     TAKE these medications    amLODipine 5 MG tablet Commonly known as: NORVASC Take 1 tablet (5 mg total) by mouth daily. Start taking on: December 13, 2021   aspirin EC 81 MG tablet Take 81 mg by mouth in the morning and at bedtime. Swallow whole.   Azelastine-Fluticasone 137-50 MCG/ACT Susp INSTILL 2 SPRAYS IN EACH NOSTRIL TWICE A DAY What changed: See the new instructions.   dexlansoprazole 60 MG capsule Commonly known as: DEXILANT Take 1 capsule (60 mg total) by mouth daily. **PLEASE CALL OFFICE TO SCHEDULE FOLLOW UP   dutasteride 0.5 MG capsule Commonly known as: AVODART Take 1 capsule (0.5 mg total) by mouth daily.   HYDROcodone-acetaminophen 10-325 MG tablet Commonly known as: NORCO Take 1 tablet by mouth every 6 (six) hours as needed for moderate pain.   Livalo 4 MG Tabs Generic drug: Pitavastatin Calcium Take 1 tablet (4 mg total) by mouth daily.   naloxone 4 MG/0.1ML Liqd nasal spray kit Commonly known as: NARCAN Place 1 spray into the nose as needed (opioid overdose).           Outstanding Labs/Studies   None  Duration of Discharge Encounter   Greater than 30 minutes including physician time.  Signed, Erma Heritage, PA-C 12/12/2021, 10:40 AM  EP Attending  Patient seen and examined. Agree with above. See my consult  note. He is stable for DC. His sinus node dysfunction is asymptomatic. He will undergo watchful waiting. Followup as above.   Carleene Overlie Liberty Seto,MD

## 2021-12-12 NOTE — Progress Notes (Signed)
   12/12/21 0447  Assess: MEWS Score  Temp 97.8 F (36.6 C)  BP (!) 149/87  MAP (mmHg) 101  Pulse Rate (!) 41  ECG Heart Rate (!) 38  Resp 15  Level of Consciousness Alert  SpO2 98 %  O2 Device Room Air  Assess: MEWS Score  MEWS Temp 0  MEWS Systolic 0  MEWS Pulse 2  MEWS RR 0  MEWS LOC 0  MEWS Score 2  MEWS Score Color Yellow  Assess: if the MEWS score is Yellow or Red  Were vital signs taken at a resting state? Yes  Focused Assessment No change from prior assessment  Does the patient meet 2 or more of the SIRS criteria? No  MEWS guidelines implemented *See Row Information* Yes  Treat  Pain Scale 0-10  Pain Score 0  Take Vital Signs  Increase Vital Sign Frequency  Yellow: Q 2hr X 2 then Q 4hr X 2, if remains yellow, continue Q 4hrs  Escalate  MEWS: Escalate Yellow: discuss with charge nurse/RN and consider discussing with provider and RRT  Notify: Charge Nurse/RN  Name of Charge Nurse/RN Notified [place], RN  Notify: Estate agent Name/Title  (MD aware)  Assess: SIRS CRITERIA  SIRS Temperature  0  SIRS Pulse 0  SIRS Respirations  0  SIRS WBC 0  SIRS Score Sum  0

## 2021-12-12 NOTE — Consult Note (Signed)
Cardiology Consultation:   Patient ID: Jonathan [name] MRN: [id number]; DOB: [birth year]  Admit date: 12/11/2021 Date of Consult: 12/12/2021  PCP:  Janith Lima, MD   [facility] Providers Cardiologist:  Minus Breeding, MD        Patient Profile:   Jonathan [name] is a 66 y.o. male with a hx of HTN and sinus node dysfunction who is being seen 12/12/2021 for the evaluation of bradycardia at the request of Dr. Harl Bowie.  History of Present Illness:   Jonathan [name] has a h/o HTN   Past Medical History:  Diagnosis Date   Anxiety    Arthritis    Avascular necrosis of bone ([place])    left hip    Back pain, lumbosacral 2000   Blind right eye    Colon polyps 08/29/2011   Hyperplastic colon polyps   Mitral valve prolapse     Past Surgical History:  Procedure Laterality Date   BUBBLE STUDY  11/25/2020   Procedure: BUBBLE STUDY;  Surgeon: Geralynn Rile, MD;  Location: [facility];  Service: Cardiovascular;;   EYE SURGERY  2007   Fractured bones repaired/blind in right eye   HERNIA REPAIR     Right inguinal   SPINE SURGERY     TEE WITHOUT CARDIOVERSION N/A 11/25/2020   Procedure: TRANSESOPHAGEAL ECHOCARDIOGRAM (TEE);  Surgeon: Geralynn Rile, MD;  Location: [place];  Service: Cardiovascular;  Laterality: N/A;   TOTAL HIP ARTHROPLASTY Right 09/18/2014   Procedure: RIGHT TOTAL HIP ARTHROPLASTY ANTERIOR APPROACH;  Surgeon: Rod Can, MD;  [facility];  Service: Orthopedics;  Laterality: Right;   TOTAL HIP ARTHROPLASTY Left 04/06/2017   Procedure: LEFT TOTAL HIP ARTHROPLASTY ANTERIOR APPROACH;  Surgeon: Rod Can, MD;  Location[facility];  Service: Orthopedics;  Laterality: Left;  Needs RNFA     Home Medications:  Prior to Admission medications   Medication Sig Start Date End Date Taking? Authorizing Provider  aspirin EC 81 MG tablet Take 81 mg by mouth in the morning and at bedtime. Swallow whole.   Yes [provider]   Azelastine-Fluticasone 137-50 MCG/ACT SUSP INSTILL 2 SPRAYS IN EACH NOSTRIL TWICE A DAY Patient taking differently: Place 2 sprays into the nose at bedtime. 01/18/21  Yes Janith Lima, MD  dexlansoprazole (DEXILANT) 60 MG capsule Take 1 capsule (60 mg total) by mouth daily. **PLEASE CALL OFFICE TO SCHEDULE FOLLOW UP 12/02/21  Yes Noralyn Pick, NP  dutasteride (AVODART) 0.5 MG capsule Take 1 capsule (0.5 mg total) by mouth daily. 01/28/21  Yes Janith Lima, MD  HYDROcodone-acetaminophen (NORCO) 10-325 MG tablet Take 1 tablet by mouth every 6 (six) hours as needed for moderate pain. 11/16/21  Yes Janith Lima, MD  naloxone [facility]) nasal spray 4 mg/0.1 mL Place 1 spray into the nose as needed (opioid overdose). 08/16/21  Yes Janith Lima, MD  Pitavastatin Calcium (LIVALO) 4 MG TABS Take 1 tablet (4 mg total) by mouth daily. 11/29/21  Yes Janith Lima, MD    Inpatient Medications: Scheduled Meds:  amLODipine  5 mg Oral Daily   aspirin EC  81 mg Oral Daily   azelastine  1 spray Each Nare QHS   And   fluticasone  1 spray Each Nare QHS   dutasteride  0.5 mg Oral Daily   enoxaparin (LOVENOX) injection  40 mg Subcutaneous Q24H   pantoprazole  40 mg Oral Daily   pravastatin  20 mg Oral q1800   Continuous Infusions:  PRN Meds: acetaminophen, HYDROcodone-acetaminophen, nitroGLYCERIN, ondansetron (ZOFRAN) IV  Allergies:    Allergies  Allergen Reactions   Cortisone Rash    "act different"   Lipitor [Atorvastatin Calcium] Other (See Comments)    "side pain"    Social History:   Social History   Socioeconomic History   Marital status: Divorced    Spouse name: Not on file   Number of children: 1   Years of education: Not on file   Highest education level: Not on file  Occupational History    Employer: DISABLED  Tobacco Use   Smoking status: Some Days    Packs/day: 0.10    Years: 30.00    Total pack years: 3.00    Types: Cigarettes, Cigars   Smokeless tobacco:  Never  Vaping Use   Vaping Use: Never used  Substance and Sexual Activity   Alcohol use: No   Drug use: Yes    Types: Marijuana    Comment: 1-2 a week   Sexual activity: Not on file  Other Topics Concern   Not on file  Social History Narrative   Lives with wife.   Social Determinants of Health   Financial Resource Strain: Low Risk  (12/25/2019)   Overall Financial Resource Strain (CARDIA)    Difficulty of Paying Living Expenses: Not hard at all  Food Insecurity: Not on file  Transportation Needs: Not on file  Physical Activity: Not on file  Stress: Not on file  Social Connections: Not on file  Intimate Partner Violence: Not on file    Family History:    Family History  Problem Relation Age of Onset   Stroke Mother    Arthritis Mother    Stroke Father    Hypertension Father    Cancer Neg Hx    COPD Neg Hx    Heart disease Neg Hx    Kidney disease Neg Hx      ROS:  Please see the history of present illness.   All other ROS reviewed and negative.     Physical Exam/Data:   Vitals:   12/11/21 2009 12/12/21 0047 12/12/21 0447 12/12/21 0753  BP: (!) 147/91 (!) 146/83 (!) 149/87 140/85  Pulse: (!) 49 60 (!) 41 (!) 32  Resp: '15 16 15 16  '$ Temp: 98.4 F (36.9 C) 97.8 F (36.6 C) 97.8 F (36.6 C) (!) 97.5 F (36.4 C)  TempSrc: Oral Oral Oral Oral  SpO2: 96% 97% 98% 98%  Weight:      Height:       No intake or output data in the 24 hours ending 12/12/21 0958    12/11/2021   11:42 AM 11/29/2021    9:35 AM 07/26/2021   12:12 PM  Last 3 Weights  Weight (lbs) 204 lb 203 lb 205 lb 6.4 oz  Weight (kg) 92.534 kg 92.08 kg 93.169 kg     Body mass index is 26.55 kg/m.  General:  Well nourished, well developed, in no acute distress HEENT: normal Neck: no JVD Vascular: No carotid bruits; Distal pulses 2+ bilaterally Cardiac:  normal S1, S2; sinus brady; no murmur  Lungs:  clear to auscultation bilaterally, no wheezing, rhonchi or rales  Abd: soft, nontender, no  hepatomegaly  Ext: no edema Musculoskeletal:  No deformities, BUE and BLE strength normal and equal Skin: warm and dry  Neuro:  CNs 2-12 intact, no focal abnormalities noted Psych:  Normal affect   EKG:  The EKG was personally reviewed and demonstrates:  sinus bradycardia at  34/min Telemetry:  Telemetry was personally reviewed and demonstrates:  sinus brady and NSR  Relevant CV Studies: none  Laboratory Data:  High Sensitivity Troponin:   Recent Labs  Lab 12/11/21 1226 12/11/21 1359  TROPONINIHS 11 14     Chemistry Recent Labs  Lab 12/11/21 1226 12/12/21 0356  NA 140 135  K 3.9 3.7  CL 108 106  CO2 16* 22  GLUCOSE 88 101*  BUN 8 11  CREATININE 0.90 0.92  CALCIUM 9.3 8.8*  MG  --  2.1  GFRNONAA >60 >60  ANIONGAP 16* 7    No results for input(s): "PROT", "ALBUMIN", "AST", "ALT", "ALKPHOS", "BILITOT" in the last 168 hours. Lipids No results for input(s): "CHOL", "TRIG", "HDL", "LABVLDL", "LDLCALC", "CHOLHDL" in the last 168 hours.  Hematology Recent Labs  Lab 12/11/21 1226 12/12/21 0356  WBC 3.4* 4.3  RBC 5.23 5.32  HGB 14.9 15.0  HCT 44.2 42.1  MCV 84.5 79.1*  MCH 28.5 28.2  MCHC 33.7 35.6  RDW 14.0 13.6  PLT 219 221   Thyroid  Recent Labs  Lab 12/12/21 0356  TSH 1.607    BNPNo results for input(s): "BNP", "PROBNP" in the last 168 hours.  DDimer No results for input(s): "DDIMER" in the last 168 hours.   Radiology/Studies:  DG Chest Port 1 View  Result Date: 12/11/2021 CLINICAL DATA:  Bradycardia. EXAM: PORTABLE CHEST 1 VIEW COMPARISON:  None Available. FINDINGS: UPPER limits normal heart size noted. There is no evidence of focal airspace disease, pulmonary edema, suspicious pulmonary nodule/mass, pleural effusion, or pneumothorax. No acute bony abnormalities are identified. IMPRESSION: No active disease. Electronically Signed   By: Margarette Canada M.D.   On: 12/11/2021 12:53     Assessment and Plan:   Asymptomatic sinus node dysfunction - At the  bedside he would sit up in bed and his HR would go up into the 60's. He is asymptomatic. I recommended avoiding AV node/SA node blocking drugs.  Uncontrolled HTN - his bp is in the 140's. He can be discharged home  with outpatient followup. Consider hydralizine/nitrates as an outpatient to help with his bp.   For questions or updates, please contact [place] Please consult [email or web address] for contact info under    Signed, Cristopher Peru, MD  12/12/2021 9:58 AM

## 2021-12-12 NOTE — Care Management Obs Status (Signed)
[place] NOTIFICATION   Patient Details  Name: GURSHAN SETTLEMIRE MRN: [id number] Date of Birth: 1955-07-06   Medicare Observation Status Notification Given:  Yes    Dawayne Patricia, RN 12/12/2021, 10:41 AM

## 2021-12-12 NOTE — Care Management CC44 (Signed)
Condition Code 44 Documentation Completed  Patient Details  Name: Jonathan [name] MRN: [id number] Date of Birth: 1956/03/17   Condition Code 44 given:  Yes Patient signature on Condition Code 44 notice:  Yes Documentation of 2 MD's agreement:  Yes Code 44 added to claim:  Yes    Dawayne Patricia, RN 12/12/2021, 10:41 AM

## 2021-12-29 ENCOUNTER — Ambulatory Visit (INDEPENDENT_AMBULATORY_CARE_PROVIDER_SITE_OTHER): Payer: Medicare Other | Admitting: Physician Assistant

## 2021-12-29 ENCOUNTER — Encounter: Payer: Self-pay | Admitting: Physician Assistant

## 2021-12-29 VITALS — BP 162/84 | HR 62 | Ht 73.0 in | Wt 206.0 lb

## 2021-12-29 DIAGNOSIS — K2100 Gastro-esophageal reflux disease with esophagitis, without bleeding: Secondary | ICD-10-CM

## 2021-12-29 NOTE — Patient Instructions (Signed)
If you are age 68 or older, your body mass index should be between 23-30. Your Body mass index is 27.18 kg/m. If this is out of the aforementioned range listed, please consider follow up with your Primary Care Provider. ________________________________________________________  The [facility] providers would like to encourage you to use [facility] to communicate with providers for non-urgent requests or questions.  Due to long hold times on the telephone, sending your provider a message by [facility] may be a faster and more efficient way to get a response.  Please allow 48 business hours for a response.  Please remember that this is for non-urgent requests.  _______________________________________________________  We have sent the following medications to your pharmacy for you to pick up at your convenience:  Dexilant   You will follow up with our office on an as needed basis.  Thank you for entrusting me with your care and choosing [facility].  Lovett Calender

## 2021-12-29 NOTE — Progress Notes (Signed)
Chief Complaint: Following up reflux  HPI:    Jonathan [name] is a 66 year old African-American male, known to Dr. Silverio Decamp, with a past medical history of anxiety, mitral valve prolapse, diastolic CHF, colon polyps and reflux, who presents to clinic today for follow-up of his GERD.    09/02/2019 EGD and colonoscopy.  EGD with a small hiatal hernia and nonobstructing Schatzki's ring, gastric biopsies were normal without evidence of H. pylori.  Colonoscopy with 4 small tubular adenomas/hyperplastic polyps from the rectum and transverse colon.  Repeat recommended in 7 years.    12/01/2020 patient seen in follow-up for reflux by Carl Best, NP.  At that time was doing well with Dexilant 60 mg daily.    Today, patient tells me he is having no problems at all.  The only thing he notes that is if he drinks anything but water he feels like his stomach gets sort of "full feeling", as long as he avoids sweet tea and sodas and just drinks water he does fine.    Denies fever, chills, weight loss or blood in his stool.  Past Medical History:  Diagnosis Date   Anxiety    Arthritis    Avascular necrosis of bone ([place])    left hip    Back pain, lumbosacral 2000   Blind right eye    Colon polyps 08/29/2011   Hyperplastic colon polyps   Mitral valve prolapse     Past Surgical History:  Procedure Laterality Date   BUBBLE STUDY  11/25/2020   Procedure: BUBBLE STUDY;  Surgeon: Geralynn Rile, MD;  Location: [place];  Service: Cardiovascular;;   EYE SURGERY  2007   Fractured bones repaired/blind in right eye   HERNIA REPAIR     Right inguinal   SPINE SURGERY     TEE WITHOUT CARDIOVERSION N/A 11/25/2020   Procedure: TRANSESOPHAGEAL ECHOCARDIOGRAM (TEE);  Surgeon: Geralynn Rile, MD;  Location: [place];  Service: Cardiovascular;  Laterality: N/A;   TOTAL HIP ARTHROPLASTY Right 09/18/2014   Procedure: RIGHT TOTAL HIP ARTHROPLASTY ANTERIOR APPROACH;  Surgeon: Rod Can, MD;   [facility];  Service: Orthopedics;  Laterality: Right;   TOTAL HIP ARTHROPLASTY Left 04/06/2017   Procedure: LEFT TOTAL HIP ARTHROPLASTY ANTERIOR APPROACH;  Surgeon: Rod Can, MD;  [facility];  Service: Orthopedics;  Laterality: Left;  Needs RNFA    Current Outpatient Medications  Medication Sig Dispense Refill   amLODipine (NORVASC) 5 MG tablet Take 1 tablet (5 mg total) by mouth daily. 90 tablet 1   aspirin EC 81 MG tablet Take 81 mg by mouth in the morning and at bedtime. Swallow whole.     Azelastine-Fluticasone 137-50 MCG/ACT SUSP INSTILL 2 SPRAYS IN EACH NOSTRIL TWICE A DAY (Patient taking differently: Place 2 sprays into the nose at bedtime.) 23 g 5   dexlansoprazole (DEXILANT) 60 MG capsule Take 1 capsule (60 mg total) by mouth daily. **PLEASE CALL OFFICE TO SCHEDULE FOLLOW UP 90 capsule 0   dutasteride (AVODART) 0.5 MG capsule Take 1 capsule (0.5 mg total) by mouth daily. 90 capsule 1   HYDROcodone-acetaminophen (NORCO) 10-325 MG tablet Take 1 tablet by mouth every 6 (six) hours as needed for moderate pain. 90 tablet 0   naloxone (NARCAN) nasal spray 4 mg/0.1 mL Place 1 spray into the nose as needed (opioid overdose). 2 each 2   Pitavastatin Calcium (LIVALO) 4 MG TABS Take 1 tablet (4 mg total) by mouth daily. 90 tablet 1   No current  facility-administered medications for this visit.    Allergies as of 12/29/2021 - Review Complete 12/29/2021  Allergen Reaction Noted   Cortisone Rash 10/28/2009   Lipitor [atorvastatin calcium] Other (See Comments) 09/19/2017    Family History  Problem Relation Age of Onset   Stroke Mother    Arthritis Mother    Stroke Father    Hypertension Father    Cancer Neg Hx    COPD Neg Hx    Heart disease Neg Hx    Kidney disease Neg Hx     Social History   Socioeconomic History   Marital status: Divorced    Spouse name: Not on file   Number of children: 1   Years of education: Not on file   Highest education level: Not on  file  Occupational History    Employer: DISABLED  Tobacco Use   Smoking status: Some Days    Packs/day: 0.10    Years: 30.00    Total pack years: 3.00    Types: Cigarettes, Cigars   Smokeless tobacco: Never  Vaping Use   Vaping Use: Never used  Substance and Sexual Activity   Alcohol use: No   Drug use: Yes    Types: Marijuana    Comment: 1-2 a week   Sexual activity: Not on file  Other Topics Concern   Not on file  Social History Narrative   Lives with wife.   Social Determinants of Health   Financial Resource Strain: Low Risk  (12/25/2019)   Overall Financial Resource Strain (CARDIA)    Difficulty of Paying Living Expenses: Not hard at all  Food Insecurity: Not on file  Transportation Needs: Not on file  Physical Activity: Not on file  Stress: Not on file  Social Connections: Not on file  Intimate Partner Violence: Not on file    Review of Systems:    Constitutional: No weight loss, fever or chills Cardiovascular: No chest pain Respiratory: No SOB Gastrointestinal: See HPI and otherwise negative   Physical Exam:  Vital signs: BP (!) 162/84   Pulse 62   Ht '6\' 1"'$  (1.854 m)   Wt 206 lb (93.4 kg)   BMI 27.18 kg/m    Constitutional:   Pleasant AA male appears to be in NAD, Well developed, Well nourished, alert and cooperative Respiratory: Respirations even and unlabored. Lungs clear to auscultation bilaterally.   No wheezes, crackles, or rhonchi.  Cardiovascular: Normal S1, S2. No MRG. Regular rate and rhythm. No peripheral edema, cyanosis or pallor.  Gastrointestinal:  Soft, nondistended, nontender. No rebound or guarding. Normal bowel sounds. No appreciable masses or hepatomegaly. Rectal:  Not performed.  Psychiatric: Oriented to person, place and time. Demonstrates good judgement and reason without abnormal affect or behaviors.  MOST RECENT LABS: CBC    Component Value Date/Time   WBC 4.3 12/12/2021 0356   RBC 5.32 12/12/2021 0356   HGB 15.0 12/12/2021  0356   HGB 16.1 11/18/2020 0840   HCT 42.1 12/12/2021 0356   HCT 46.5 11/18/2020 0840   PLT 221 12/12/2021 0356   PLT 252 11/18/2020 0840   MCV 79.1 (L) 12/12/2021 0356   MCV 83 11/18/2020 0840   MCH 28.2 12/12/2021 0356   MCHC 35.6 12/12/2021 0356   RDW 13.6 12/12/2021 0356   RDW 14.3 11/18/2020 0840   LYMPHSABS 2.6 11/29/2021 1007   MONOABS 0.6 11/29/2021 1007   EOSABS 0.1 11/29/2021 1007   BASOSABS 0.0 11/29/2021 1007    CMP     Component Value  Date/Time   NA 135 12/12/2021 0356   NA 137 11/18/2020 0840   K 3.7 12/12/2021 0356   CL 106 12/12/2021 0356   CO2 22 12/12/2021 0356   GLUCOSE 101 (H) 12/12/2021 0356   BUN 11 12/12/2021 0356   BUN 12 11/18/2020 0840   CREATININE 0.92 12/12/2021 0356   CALCIUM 8.8 (L) 12/12/2021 0356   PROT 7.4 05/19/2020 0959   ALBUMIN 4.8 05/19/2020 0959   AST 14 05/19/2020 0959   ALT 19 05/19/2020 0959   ALKPHOS 65 05/19/2020 0959   BILITOT 0.6 05/19/2020 0959   GFRNONAA >60 12/12/2021 0356   GFRAA >60 05/15/2018 0321    Assessment: 1.  GERD: History of a nonobstructing Schatzki's ring, doing well on Dexilant 60 mg daily 2.  History of adenomatous polyps: Last colonoscopy 08/2019 with repeat recommended 08/2026  Plan: 1.  Refill Dexilant 60 mg every morning, #90 with 3 refills.  Can be refilled for another year if patient is doing well. 2.  Patient will be due for repeat colonoscopy in April 2028 3.  Patient to follow in clinic in 1 to 2 years for refills.  Ellouise Newer, PA-C [facility] 12/29/2021, 9:54 AM  Cc: Janith Lima, MD

## 2022-01-23 NOTE — Progress Notes (Unsigned)
Cardiology Office Note   Date:  01/24/2022   ID:  RACHIT, GRIM July 23, 1955, MRN [id number]  PCP:  Roselee Nova, MD  Cardiologist:   Minus Breeding, MD Referring:  Roselee Nova, MD   Chief Complaint  Patient presents with   Mitral Regurgitation       History of Present Illness: Jonathan [name] is a 66 y.o. male who I have not seen since 2013. He was seen by Odie Sera NP last year for evaluation of chronic diastolic HF.    He also has bradycardia.  He has had some chronic diastolic dysfunction.  In June 2022 I sent him for an echo with a myxomatous mitral valve prolapse of both leaflets.  The MR was at least moderate.  TEE confirmed this to be moderate and suggested continued follow-up.  ERO is 0.25 cm2, Regurgitant volume was 39 cc.  Follow up TTE this June she had moderate to severe MR.  LA size was 37 mm with an EF of 60 - 65%.    He returns for follow up.  He has no symptoms.  He works hard.  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  The patient denies any new symptoms such as chest discomfort, neck or arm discomfort. There has been no new shortness of breath, PND or orthopnea. There have been no reported palpitations, presyncope or syncope.   Of note he was in the hospital for bradycardia.  His heart rate was in the 30s.  However, he is not particularly hypotensive.  He was not on any AV nodal blocking agents.  I did review these records for this visit and no meds were changed other than he was started on amlodipine for blood pressure.  Past Medical History:  Diagnosis Date   Anxiety    Arthritis    Avascular necrosis of bone ([place])    left hip    Back pain, lumbosacral 2000   Blind right eye    Colon polyps 08/29/2011   Hyperplastic colon polyps   Mitral valve prolapse     Past Surgical History:  Procedure Laterality Date   BUBBLE STUDY  11/25/2020   Procedure: BUBBLE STUDY;  Surgeon: Geralynn Rile, MD;  Location: [facility];  Service: Cardiovascular;;   EYE SURGERY  2007   Fractured bones repaired/blind in right eye   HERNIA REPAIR     Right inguinal   SPINE SURGERY     TEE WITHOUT CARDIOVERSION N/A 11/25/2020   Procedure: TRANSESOPHAGEAL ECHOCARDIOGRAM (TEE);  Surgeon: Geralynn Rile, MD;  Location: [facility];  Service: Cardiovascular;  Laterality: N/A;   TOTAL HIP ARTHROPLASTY Right 09/18/2014   Procedure: RIGHT TOTAL HIP ARTHROPLASTY ANTERIOR APPROACH;  Surgeon: Rod Can, MD;  [facility];  Service: Orthopedics;  Laterality: Right;   TOTAL HIP ARTHROPLASTY Left 04/06/2017   Procedure: LEFT TOTAL HIP ARTHROPLASTY ANTERIOR APPROACH;  Surgeon: Rod Can, MD;  Location[facility];  Service: Orthopedics;  Laterality: Left;  Needs RNFA     Current Outpatient Medications  Medication Sig Dispense Refill   amLODipine (NORVASC) 5 MG tablet Take 1 tablet (5 mg total) by mouth daily. 90 tablet 1   aspirin EC 81 MG tablet Take 81 mg by mouth in the morning and at bedtime. Swallow whole.     Azelastine-Fluticasone 137-50 MCG/ACT SUSP INSTILL 2 SPRAYS IN EACH NOSTRIL TWICE A DAY 23 g 5   dexlansoprazole (  DEXILANT) 60 MG capsule Take 1 capsule (60 mg total) by mouth daily. **PLEASE CALL OFFICE TO SCHEDULE FOLLOW UP 90 capsule 0   dutasteride (AVODART) 0.5 MG capsule Take 1 capsule (0.5 mg total) by mouth daily. 90 capsule 1   HYDROcodone-acetaminophen (NORCO) 10-325 MG tablet Take 1 tablet by mouth every 6 (six) hours as needed for moderate pain. 90 tablet 0   levocetirizine (XYZAL) 2.5 MG/5ML solution Take 2.5 mg by mouth every evening.     naloxone (NARCAN) nasal spray 4 mg/0.1 mL Place 1 spray into the nose as needed (opioid overdose). 2 each 2   Pitavastatin Calcium (LIVALO) 4 MG TABS Take 1 tablet (4 mg total) by mouth daily. 90 tablet 1   No current facility-administered medications for this visit.    Allergies:   Cortisone and Lipitor [atorvastatin calcium]    ROS:  Please see  the history of present illness.   Otherwise, review of systems are positive for none.   All other systems are reviewed and negative.    PHYSICAL EXAM: VS:  BP 130/78   Pulse (!) 46   Ht 6' (1.829 m)   Wt 210 lb 6.4 oz (95.4 kg)   SpO2 99%   BMI 28.54 kg/m  , BMI Body mass index is 28.54 kg/m. GENERAL:  Well appearing NECK:  No jugular venous distention, waveform within normal limits, carotid upstroke brisk and symmetric, no bruits, no thyromegaly LUNGS:  Clear to auscultation bilaterally CHEST:  Unremarkable HEART:  PMI not displaced or sustained,S1 and S2 within normal limits, no S3, no S4, no clicks, no rubs, systolic murmur heard best at the apex and radiating slightly to the axilla, no diastolic murmurs ABD:  Flat, positive bowel sounds normal in frequency in pitch, no bruits, no rebound, no guarding, no midline pulsatile mass, no hepatomegaly, no splenomegaly EXT:  2 plus pulses throughout, no edema, no cyanosis no clubbing    EKG:  EKG is  ordered today. The ekg ordered today demonstrates Sinus bradycardia, rate 46, axis within normal limits, intervals within normal limits, none    Recent Labs: 12/12/2021: BUN 11; Creatinine, Ser 0.92; Hemoglobin 15.0; Magnesium 2.1; Platelets 221; Potassium 3.7; Sodium 135; TSH 1.607    Lipid Panel    Component Value Date/Time   CHOL 205 (H) 11/29/2021 1007   CHOL 191 11/13/2020 0852   TRIG 143.0 11/29/2021 1007   HDL 51.30 11/29/2021 1007   HDL 55 11/13/2020 0852   CHOLHDL 4 11/29/2021 1007   VLDL 28.6 11/29/2021 1007   LDLCALC 125 (H) 11/29/2021 1007   LDLCALC 118 (H) 11/13/2020 0852   LDLDIRECT 131.6 07/14/2011 0940      Wt Readings from Last 3 Encounters:  01/24/22 210 lb 6.4 oz (95.4 kg)  12/29/21 206 lb (93.4 kg)  12/11/21 204 lb (92.5 kg)      Other studies Reviewed: Additional studies/ records that were reviewed today include: Echo and hospital records Review of the above records demonstrates:  Please see elsewhere  in the note.     ASSESSMENT AND PLAN:  MR:   The MR was moderate to severe.  However, it has not changed.  He has no symptoms.  Left ventricular function and size and measurements do not suggest in the absence of symptoms and indication for repair.  I will follow him every 6 months.  Bradycardia:   I reviewed his hospitalization.  He tolerates this rhythm.  No change in therapy.   Essential hypertension: His blood pressure is  at target.  No change in therapy.   Tobacco use:   He has quit smoking.  Current medicines are reviewed at length with the patient today.  The patient does not have concerns regarding medicines.  The following changes have been made:  no change  Labs/ tests ordered today include:   Orders Placed This Encounter  Procedures   EKG 12-Lead   ECHOCARDIOGRAM COMPLETE      Disposition:   FU with me in in 6 months after an echo.   Signed, Minus Breeding, MD  01/24/2022 8:06 AM    [place]

## 2022-01-24 ENCOUNTER — Encounter: Payer: Self-pay | Admitting: Cardiology

## 2022-01-24 ENCOUNTER — Ambulatory Visit: Payer: Medicare Other | Attending: Cardiology | Admitting: Cardiology

## 2022-01-24 VITALS — BP 130/78 | HR 46 | Ht 72.0 in | Wt 210.4 lb

## 2022-01-24 DIAGNOSIS — Z720 Tobacco use: Secondary | ICD-10-CM

## 2022-01-24 DIAGNOSIS — I340 Nonrheumatic mitral (valve) insufficiency: Secondary | ICD-10-CM | POA: Diagnosis not present

## 2022-01-24 DIAGNOSIS — I10 Essential (primary) hypertension: Secondary | ICD-10-CM | POA: Diagnosis not present

## 2022-01-24 DIAGNOSIS — R001 Bradycardia, unspecified: Secondary | ICD-10-CM

## 2022-01-24 DIAGNOSIS — E785 Hyperlipidemia, unspecified: Secondary | ICD-10-CM | POA: Diagnosis not present

## 2022-01-24 NOTE — Patient Instructions (Signed)
Medication Instructions:  Your Physician recommend you continue on your current medication as directed.    *If you need a refill on your cardiac medications before your next appointment, please call your pharmacy*   Lab Work: None ordered today   Testing/Procedures: Your physician has requested that you have an echocardiogram in Feb, 2024. Echocardiography is a painless test that uses sound waves to create images of your heart. It provides your doctor with information about the size and shape of your heart and how well your heart's chambers and valves are working. This procedure takes approximately one hour. There are no restrictions for this procedure. [place] [address]    Follow-Up: At [facility], you and your health needs are our priority.  As part of our continuing mission to provide you with exceptional heart care, we have created designated Provider Care Teams.  These Care Teams include your primary Cardiologist (physician) and Advanced Practice Providers (APPs -  Physician Assistants and Nurse Practitioners) who all work together to provide you with the care you need, when you need it.  We recommend signing up for the patient portal called "MyChart".  Sign up information is provided on this After Visit Summary.  MyChart is used to connect with patients for Virtual Visits (Telemedicine).  Patients are able to view lab/test results, encounter notes, upcoming appointments, etc.  Non-urgent messages can be sent to your provider as well.   To learn more about what you can do with MyChart, go to [email or web address].    Your next appointment:   6 month(s)  The format for your next appointment:   In Person  Provider:   Minus Breeding, MD

## 2022-02-17 ENCOUNTER — Other Ambulatory Visit: Payer: Self-pay | Admitting: Internal Medicine

## 2022-02-17 DIAGNOSIS — I519 Heart disease, unspecified: Secondary | ICD-10-CM

## 2022-02-17 DIAGNOSIS — I10 Essential (primary) hypertension: Secondary | ICD-10-CM

## 2022-03-10 ENCOUNTER — Other Ambulatory Visit: Payer: Self-pay | Admitting: Internal Medicine

## 2022-03-10 DIAGNOSIS — J301 Allergic rhinitis due to pollen: Secondary | ICD-10-CM

## 2022-04-04 ENCOUNTER — Other Ambulatory Visit: Payer: Self-pay | Admitting: Internal Medicine

## 2022-04-04 DIAGNOSIS — J301 Allergic rhinitis due to pollen: Secondary | ICD-10-CM

## 2022-05-05 ENCOUNTER — Other Ambulatory Visit: Payer: Self-pay | Admitting: Nurse Practitioner

## 2022-05-05 DIAGNOSIS — K2100 Gastro-esophageal reflux disease with esophagitis, without bleeding: Secondary | ICD-10-CM

## 2022-06-05 ENCOUNTER — Other Ambulatory Visit: Payer: Self-pay | Admitting: Student

## 2022-06-05 ENCOUNTER — Other Ambulatory Visit: Payer: Self-pay | Admitting: Internal Medicine

## 2022-06-05 DIAGNOSIS — E785 Hyperlipidemia, unspecified: Secondary | ICD-10-CM

## 2022-06-06 NOTE — Telephone Encounter (Signed)
This is Dr. [name]'s pt. °

## 2022-06-22 ENCOUNTER — Other Ambulatory Visit: Payer: Self-pay | Admitting: Internal Medicine

## 2022-06-22 DIAGNOSIS — E785 Hyperlipidemia, unspecified: Secondary | ICD-10-CM

## 2022-07-04 ENCOUNTER — Ambulatory Visit (HOSPITAL_COMMUNITY): Payer: 59 | Attending: Cardiology

## 2022-07-04 DIAGNOSIS — I340 Nonrheumatic mitral (valve) insufficiency: Secondary | ICD-10-CM | POA: Diagnosis present

## 2022-07-04 LAB — ECHOCARDIOGRAM COMPLETE
Area-P 1/2: 3.77 cm2
MV M vel: 5.36 m/s
MV Peak grad: 114.9 mmHg
Radius: 0.8 cm
S' Lateral: 2.9 cm

## 2022-07-04 NOTE — Progress Notes (Addendum)
Cardiology Office Note   Date:  07/06/2022   ID:  Jonathan [name], Jonathan [name] 01-07-1956, MRN [id number]  PCP:  Roselee Nova, MD  Cardiologist:   Minus Breeding, MD Referring:  Roselee Nova, MD   Chief Complaint  Patient presents with   MR    History of Present Illness: Jonathan [name] is a 67 y.o. male who I saw back in  2013. He was seen by Odie Sera NP in 2020 for evaluation of chronic diastolic HF.    He also has bradycardia.  He has had some chronic diastolic dysfunction.  In June 2022 I sent him for an echo with a myxomatous mitral valve prolapse of both leaflets.  The MR was at least moderate.  TEE confirmed this to be moderate and suggested continued follow-up.  ERO is 0.25 cm2, Regurgitant volume was 39 cc.  On follow up TTE this June he had moderate to severe MR.  LA size was 37 mm with an EF of 60 - 65%.    He returns for follow up.  He still does well.  He is not exercising as much as he does during the summer.  He can do things like help his family when his son had a damaged roof.  He can walk the dog.  The patient denies any new symptoms such as chest discomfort, neck or arm discomfort. There has been no new shortness of breath, PND or orthopnea. There have been no reported palpitations, presyncope or syncope.    Past Medical History:  Diagnosis Date   Anxiety    Arthritis    Avascular necrosis of bone ([place])    left hip    Back pain, lumbosacral 2000   Blind right eye    Colon polyps 08/29/2011   Hyperplastic colon polyps   Mitral valve prolapse     Past Surgical History:  Procedure Laterality Date   BUBBLE STUDY  11/25/2020   Procedure: BUBBLE STUDY;  Surgeon: Geralynn Rile, MD;  Location: [facility];  Service: Cardiovascular;;   EYE SURGERY  2007   Fractured bones repaired/blind in right eye   HERNIA REPAIR     Right inguinal   SPINE SURGERY     TEE WITHOUT CARDIOVERSION N/A 11/25/2020   Procedure: TRANSESOPHAGEAL  ECHOCARDIOGRAM (TEE);  Surgeon: Geralynn Rile, MD;  Location: [facility];  Service: Cardiovascular;  Laterality: N/A;   TOTAL HIP ARTHROPLASTY Right 09/18/2014   Procedure: RIGHT TOTAL HIP ARTHROPLASTY ANTERIOR APPROACH;  Surgeon: Rod Can, MD;  [facility];  Service: Orthopedics;  Laterality: Right;   TOTAL HIP ARTHROPLASTY Left 04/06/2017   Procedure: LEFT TOTAL HIP ARTHROPLASTY ANTERIOR APPROACH;  Surgeon: Rod Can, MD;  [facility];  Service: Orthopedics;  Laterality: Left;  Needs RNFA     Current Outpatient Medications  Medication Sig Dispense Refill   amLODipine (NORVASC) 5 MG tablet TAKE 1 TABLET (5 MG TOTAL) BY MOUTH DAILY. 90 tablet 1   aspirin EC 81 MG tablet Take 81 mg by mouth in the morning and at bedtime. Swallow whole.     Azelastine-Fluticasone 137-50 MCG/ACT SUSP INSTILL 2 SPRAYS IN EACH NOSTRIL TWICE A DAY 23 g 5   dexlansoprazole (DEXILANT) 60 MG capsule TAKE 1 CAPSULE (60 MG TOTAL) BY MOUTH DAILY. **PLEASE CALL OFFICE TO SCHEDULE FOLLOW UP 90 capsule 0   dutasteride (AVODART) 0.5 MG capsule Take 1 capsule (0.5 mg total) by mouth daily. 90 capsule 1  HYDROcodone-acetaminophen (NORCO) 10-325 MG tablet Take 1 tablet by mouth every 6 (six) hours as needed for moderate pain. 90 tablet 0   levocetirizine (XYZAL) 2.5 MG/5ML solution Take 2.5 mg by mouth every evening.     naloxone (NARCAN) nasal spray 4 mg/0.1 mL Place 1 spray into the nose as needed (opioid overdose). 2 each 2   Pitavastatin Calcium (LIVALO) 4 MG TABS Take 1 tablet (4 mg total) by mouth daily. 90 tablet 1   No current facility-administered medications for this visit.    Allergies:   Atorvastatin calcium, Cortisone, and Lipitor [atorvastatin calcium]    ROS:  Please see the history of present illness.   Otherwise, review of systems are positive for none.   All other systems are reviewed and negative.    PHYSICAL EXAM: VS:  BP 132/84 (BP Location: Left Arm, Patient Position:  Sitting, Cuff Size: Normal)   Pulse (!) 46   Ht 6\' 1"  (1.854 m)   Wt 214 lb 6.4 oz (97.3 kg)   SpO2 98%   BMI 28.29 kg/m  , BMI Body mass index is 28.29 kg/m. GENERAL:  Well appearing NECK:  No jugular venous distention, waveform within normal limits, carotid upstroke brisk and symmetric, no bruits, no thyromegaly LUNGS:  Clear to auscultation bilaterally CHEST:  Unremarkable HEART:  PMI not displaced or sustained,S1 and S2 within normal limits, no S3, no S4, no clicks, no rubs, 3 out of 6 murmur somewhat late in systole heard best at the apex and axilla but also slightly anteriorly, no diastolic murmurs ABD:  Flat, positive bowel sounds normal in frequency in pitch, no bruits, no rebound, no guarding, no midline pulsatile mass, no hepatomegaly, no splenomegaly EXT:  2 plus pulses throughout, no edema, no cyanosis no clubbing   EKG:  EKG is  ordered today. The ekg ordered today demonstrates Sinus bradycardia, rate 46, axis within normal limits, intervals within normal limits, none    Recent Labs: 12/12/2021: BUN 11; Creatinine, Ser 0.92; Hemoglobin 15.0; Magnesium 2.1; Platelets 221; Potassium 3.7; Sodium 135; TSH 1.607    Lipid Panel    Component Value Date/Time   CHOL 205 (H) 11/29/2021 1007   CHOL 191 11/13/2020 0852   TRIG 143.0 11/29/2021 1007   HDL 51.30 11/29/2021 1007   HDL 55 11/13/2020 0852   CHOLHDL 4 11/29/2021 1007   VLDL 28.6 11/29/2021 1007   LDLCALC 125 (H) 11/29/2021 1007   LDLCALC 118 (H) 11/13/2020 0852   LDLDIRECT 131.6 07/14/2011 0940      Wt Readings from Last 3 Encounters:  07/06/22 214 lb 6.4 oz (97.3 kg)  01/24/22 210 lb 6.4 oz (95.4 kg)  12/29/21 206 lb (93.4 kg)      Other studies Reviewed: Additional studies/ records that were reviewed today include: Echo Review of the above records demonstrates:  Please see elsewhere in the note.     ASSESSMENT AND PLAN:  MR:   The MR was severe.    It is difficult to assess via TTE.  He has an  eccentric jet making measurements of more difficult.  The measurements of the transthoracic are somewhat limited.  It is probably slightly progressed and likely severe.  It does look to be repairable.   He has normal left-ventricular function and size.  I am going to schedule him for a TEE to further assess and consider timing of mitral valve repair.   Bradycardia: He has no symptoms.  No change in therapy.  Essential hypertension: His blood pressure  is at target.  No change in therapy.   Tobacco use:  He started smoking again.    Current medicines are reviewed at length with the patient today.  The patient does not have concerns regarding medicines.  The following changes have been made:  None  Labs/ tests ordered today include:   Orders Placed This Encounter  Procedures   CBC   Basic metabolic panel   EKG 41-OINO      Disposition:   FU with me in six months.     Signed, Minus Breeding, MD  07/06/2022 9:27 AM    [facility]

## 2022-07-06 ENCOUNTER — Ambulatory Visit: Payer: 59 | Attending: Cardiology | Admitting: Cardiology

## 2022-07-06 ENCOUNTER — Other Ambulatory Visit: Payer: Self-pay | Admitting: *Deleted

## 2022-07-06 ENCOUNTER — Encounter: Payer: Self-pay | Admitting: Cardiology

## 2022-07-06 VITALS — BP 132/84 | HR 46 | Ht 73.0 in | Wt 214.4 lb

## 2022-07-06 DIAGNOSIS — Z720 Tobacco use: Secondary | ICD-10-CM

## 2022-07-06 DIAGNOSIS — I341 Nonrheumatic mitral (valve) prolapse: Secondary | ICD-10-CM

## 2022-07-06 DIAGNOSIS — I340 Nonrheumatic mitral (valve) insufficiency: Secondary | ICD-10-CM

## 2022-07-06 DIAGNOSIS — R001 Bradycardia, unspecified: Secondary | ICD-10-CM

## 2022-07-06 DIAGNOSIS — I10 Essential (primary) hypertension: Secondary | ICD-10-CM

## 2022-07-06 NOTE — Progress Notes (Signed)
ORDERS PLACED FOR PROCEDURE

## 2022-07-06 NOTE — Patient Instructions (Addendum)
Medication Instructions:  No changes   *If you need a refill on your cardiac medications before your next appointment, please call your pharmacy*   Lab Work: please do on August 03, 2022  CBC BMP If you have labs (blood work) drawn today and your tests are completely normal, you will receive your results only by: [place] (if you have MyChart) OR A paper copy in the mail If you have any lab test that is abnormal or we need to change your treatment, we will call you to review the results.   Testing/Procedures: Will on March 13 ,2024- at [facility]  - go to entrance Jonathan [name] has requested that you have a TEE. During a TEE, sound waves are used to create images of your heart. It provides your doctor with information about the size and shape of your heart and how well your heart's chambers and valves are working. In this test, a transducer is attached to the end of a flexible tube that's guided down your throat and into your esophagus (the tube leading from you mouth to your stomach) to get a more detailed image of your heart. You are not awake for the procedure. Please see the instruction sheet given to you today. For further information please visit [email or web address].     Follow-Up: At [facility], you and your health needs are our priority.  As part of our continuing mission to provide you with exceptional heart care, we have created designated Provider Care Teams.  These Care Teams include your primary Cardiologist (physician) and Advanced Practice Providers (APPs -  Physician Assistants and Nurse Practitioners) who all work together to provide you with the care you need, when you need it.     Your next appointment:   6 month(s)  The format for your next appointment:   In Person  Provider:   Minus Breeding, MD    Other Instructions     Dear Jonathan [name]  You are scheduled for a TEE (Transesophageal Echocardiogram) on Wednesday, March 13 with Dr.  Sallyanne Kuster.  Please arrive at the [facility] (Main Entrance A) at [facility]: [address] [place], [place] [address] at 8:00 AM.   DIET:  Nothing to eat or drink after midnight except a sip of water with medications (see medication instructions below)  MEDICATION INSTRUCTIONS: No changes    LABS:   Come to [facility] office  FYI:  For your safety, and to allow [place] to monitor your vital signs accurately during the surgery/procedure we request: If you have artificial nails, gel coating, SNS etc, please have those removed prior to your surgery/procedure. Not having the nail coverings /polish removed may result in cancellation or delay of your surgery/procedure.  You must have a responsible person to drive you home and stay in the waiting area during your procedure. Failure to do so could result in cancellation.  Bring your insurance cards.  *Special Note: Every effort is made to have your procedure done on time. Occasionally there are emergencies that occur at the hospital that may cause delays. Please be patient if a delay does occur.

## 2022-07-19 ENCOUNTER — Other Ambulatory Visit: Payer: Self-pay | Admitting: Nurse Practitioner

## 2022-07-19 DIAGNOSIS — K2100 Gastro-esophageal reflux disease with esophagitis, without bleeding: Secondary | ICD-10-CM

## 2022-08-04 LAB — BASIC METABOLIC PANEL
BUN/Creatinine Ratio: 12 (ref 10–24)
BUN: 11 mg/dL (ref 8–27)
CO2: 23 mmol/L (ref 20–29)
Calcium: 9.7 mg/dL (ref 8.6–10.2)
Chloride: 103 mmol/L (ref 96–106)
Creatinine, Ser: 0.9 mg/dL (ref 0.76–1.27)
Glucose: 110 mg/dL — ABNORMAL HIGH (ref 70–99)
Potassium: 4.9 mmol/L (ref 3.5–5.2)
Sodium: 136 mmol/L (ref 134–144)
eGFR: 93 mL/min/{1.73_m2} (ref >59)

## 2022-08-04 LAB — CBC
Hematocrit: 47.5 % (ref 37.5–51.0)
Hemoglobin: 16 g/dL (ref 13.0–17.7)
MCH: 27.5 pg (ref 26.6–33.0)
MCHC: 33.7 g/dL (ref 31.5–35.7)
MCV: 82 fL (ref 79–97)
Platelets: 229 10*3/uL (ref 150–450)
RBC: 5.81 x10E6/uL — ABNORMAL HIGH (ref 4.14–5.80)
RDW: 14.5 % (ref 11.6–15.4)
WBC: 3.4 10*3/uL (ref 3.4–10.8)

## 2022-08-09 NOTE — Anesthesia Preprocedure Evaluation (Addendum)
Anesthesia Evaluation  Patient identified by MRN, date of birth, ID band Patient awake    Reviewed: Allergy & Precautions, NPO status , Patient's Chart, lab work & pertinent test results  Airway Mallampati: II  TM Distance: >3 FB Neck ROM: Full    Dental  (+) Teeth Intact, Dental Advisory Given   Pulmonary former smoker   Pulmonary exam normal breath sounds clear to auscultation       Cardiovascular hypertension, Pt. on medications + Valvular Problems/Murmurs MR and MVP  Rhythm:Regular Rate:Normal + Systolic murmurs    Neuro/Psych  PSYCHIATRIC DISORDERS Anxiety     Blind right eye     GI/Hepatic Neg liver ROS,GERD  Medicated,,  Endo/Other  negative endocrine ROS    Renal/GU negative Renal ROS     Musculoskeletal  (+) Arthritis ,    Abdominal   Peds  Hematology negative hematology ROS (+)   Anesthesia Other Findings   Reproductive/Obstetrics                             Anesthesia Physical Anesthesia Plan  ASA: 3  Anesthesia Plan: MAC   Post-op Pain Management: Minimal or no pain anticipated   Induction: Intravenous  PONV Risk Score and Plan: 1 and TIVA and Treatment may vary due to age or medical condition  Airway Management Planned: Natural Airway and Simple Face Mask  Additional Equipment:   Intra-op Plan:   Post-operative Plan:   Informed Consent: I have reviewed the patients History and Physical, chart, labs and discussed the procedure including the risks, benefits and alternatives for the proposed anesthesia with the patient or authorized representative who has indicated his/her understanding and acceptance.     Dental advisory given  Plan Discussed with: CRNA  Anesthesia Plan Comments:        Anesthesia Quick Evaluation

## 2022-08-10 ENCOUNTER — Ambulatory Visit (HOSPITAL_BASED_OUTPATIENT_CLINIC_OR_DEPARTMENT_OTHER): Payer: 59

## 2022-08-10 ENCOUNTER — Encounter (HOSPITAL_COMMUNITY)
Admission: RE | Disposition: A | Discharge status: Home / Self Care | Payer: Self-pay | Source: Ambulatory Visit | Attending: Cardiovascular Disease

## 2022-08-10 ENCOUNTER — Ambulatory Visit (HOSPITAL_COMMUNITY)
Admission: RE | Admit: 2022-08-10 | Discharge: 2022-08-10 | Disposition: A | Discharge status: Home / Self Care | Payer: 59 | Source: Ambulatory Visit | Attending: Cardiovascular Disease | Admitting: Cardiovascular Disease

## 2022-08-10 ENCOUNTER — Ambulatory Visit (HOSPITAL_BASED_OUTPATIENT_CLINIC_OR_DEPARTMENT_OTHER): Payer: 59 | Admitting: Certified Registered Nurse Anesthetist

## 2022-08-10 ENCOUNTER — Ambulatory Visit (HOSPITAL_COMMUNITY): Payer: 59 | Admitting: Certified Registered Nurse Anesthetist

## 2022-08-10 ENCOUNTER — Other Ambulatory Visit: Payer: Self-pay

## 2022-08-10 DIAGNOSIS — I341 Nonrheumatic mitral (valve) prolapse: Secondary | ICD-10-CM | POA: Diagnosis not present

## 2022-08-10 DIAGNOSIS — I5032 Chronic diastolic (congestive) heart failure: Secondary | ICD-10-CM | POA: Insufficient documentation

## 2022-08-10 DIAGNOSIS — I10 Essential (primary) hypertension: Secondary | ICD-10-CM

## 2022-08-10 DIAGNOSIS — Z87891 Personal history of nicotine dependence: Secondary | ICD-10-CM

## 2022-08-10 DIAGNOSIS — I110 Hypertensive heart disease with heart failure: Secondary | ICD-10-CM | POA: Insufficient documentation

## 2022-08-10 DIAGNOSIS — I340 Nonrheumatic mitral (valve) insufficiency: Secondary | ICD-10-CM

## 2022-08-10 HISTORY — PX: TEE WITHOUT CARDIOVERSION: SHX5443

## 2022-08-10 LAB — ECHO TEE
MV M vel: 5.24 m/s
MV Peak grad: 109.6 mmHg
Radius: 0.9 cm
Single Plane A2C EF: 65.5 %

## 2022-08-10 SURGERY — ECHOCARDIOGRAM, TRANSESOPHAGEAL
Anesthesia: Monitor Anesthesia Care

## 2022-08-10 MED ORDER — PROPOFOL 10 MG/ML IV BOLUS
INTRAVENOUS | Status: DC | PRN
  Administered 2022-08-10: 50 mg via INTRAVENOUS

## 2022-08-10 MED ORDER — PROPOFOL 500 MG/50ML IV EMUL
INTRAVENOUS | Status: DC | PRN
  Administered 2022-08-10: 150 ug/kg/min via INTRAVENOUS

## 2022-08-10 MED ORDER — LIDOCAINE 2% (20 MG/ML) 5 ML SYRINGE
INTRAMUSCULAR | Status: DC | PRN
  Administered 2022-08-10: 60 mg via INTRAVENOUS

## 2022-08-10 MED ORDER — SODIUM CHLORIDE 0.9 % IV SOLN: INTRAVENOUS | Status: DC

## 2022-08-10 NOTE — H&P (Signed)
Cardiology Admission History and Physical   Patient ID: Jonathan [name] MRN: [id number]; DOB: [birth year]   Admission date: 08/10/2022  PCP:  Jonathan Nova, MD   [facility] Prov[facility] Cardiologist:  Jonathan Breeding, MD        Chief Complaint: Mitral insufficiency  Patient Profile:   Jonathan [name] is a 67 y.o. male with chronic diastolic heart failure and mitral valve prolapse who is being seen 08/10/2022 for the evaluation of mitral regurgitation by TEE.  History of Present Illness:   Jonathan [name] was referred by Jonathan [name] for TEE in the setting of what appears to be worsening mitral insufficiency from mitral valve prolapse.  His most recent transthoracic echocardiogram described mitral insufficiency with an eccentric jet that was difficult to quantify but was probably severe.   Past Medical History:  Diagnosis Date   Anxiety    Arthritis    Avascular necrosis of bone ([place])    left hip    Back pain, lumbosacral 2000   Blind right eye    Colon polyps 08/29/2011   Hyperplastic colon polyps   Mitral valve prolapse     Past Surgical History:  Procedure Laterality Date   BUBBLE STUDY  11/25/2020   Procedure: BUBBLE STUDY;  Surgeon: Jonathan Rile, MD;  Location: [facility];  Service: Cardiovascular;;   EYE SURGERY  2007   Fractured bones repaired/blind in right eye   HERNIA REPAIR     Right inguinal   SPINE SURGERY     TEE WITHOUT CARDIOVERSION N/A 11/25/2020   Procedure: TRANSESOPHAGEAL ECHOCARDIOGRAM (TEE);  Surgeon: Jonathan Rile, MD;  Location: [facility];  Service: Cardiovascular;  Laterality: N/A;   TOTAL HIP ARTHROPLASTY Right 09/18/2014   Procedure: RIGHT TOTAL HIP ARTHROPLASTY ANTERIOR APPROACH;  Surgeon: Jonathan Can, MD;  Location: [facility];  Service: Orthopedics;  Laterality: Right;   TOTAL HIP ARTHROPLASTY Left 04/06/2017   Procedure: LEFT TOTAL HIP ARTHROPLASTY ANTERIOR APPROACH;  Surgeon: Jonathan Can, MD;   Location: [facility];  Service: Orthopedics;  Laterality: Left;  Needs RNFA     Medications Prior to Admission: Prior to Admission medications   Medication Sig Start Date End Date Taking? Authorizing Provider  amLODipine (NORVASC) 5 MG tablet TAKE 1 TABLET (5 MG TOTAL) BY MOUTH DAILY. 06/06/22  Yes Jonathan Breeding, MD  aspirin 81 MG chewable tablet Chew 162 mg by mouth in the morning.   Yes [provider]  dexlansoprazole (DEXILANT) 60 MG capsule TAKE 1 CAPSULE (60 MG TOTAL) BY MOUTH DAILY. **PLEASE CALL OFFICE TO SCHEDULE FOLLOW UP 07/20/22  Yes Jonathan Erp, PA  dutasteride (AVODART) 0.5 MG capsule Take 1 capsule (0.5 mg total) by mouth daily. 01/28/21  Yes Jonathan Lima, MD  Azelastine-Fluticasone 137-50 MCG/ACT SUSP INSTILL 2 SPRAYS IN EACH NOSTRIL TWICE A DAY Patient taking differently: Place 2 sprays into both nostrils daily as needed (allergies.). 01/18/21   Jonathan Lima, MD  HYDROcodone-acetaminophen (NORCO) 10-325 MG tablet Take 1 tablet by mouth every 6 (six) hours as needed for moderate pain. 11/16/21   Jonathan Lima, MD  naloxone [facility]) nasal spray 4 mg/0.1 mL Place 1 spray into the nose as needed (opioid overdose). Patient not taking: Reported on 08/10/2022 08/16/21   Jonathan Lima, MD  Pitavastatin Calcium (LIVALO) 4 MG TABS Take 1 tablet (4 mg total) by mouth daily. 11/29/21   Jonathan Lima, MD     Allergies:    Allergies  Allergen Reactions   Cortisone Rash    "act different"   Lipitor [Atorvastatin Calcium] Other (See Comments)    "side pain"    Social History:   Social History   Socioeconomic History   Marital status: Divorced    Spouse name: Not on file   Number of children: 1   Years of education: Not on file   Highest education level: Not on file  Occupational History    Employer: DISABLED  Tobacco Use   Smoking status: Former    Packs/day: 0.10    Years: 30.00    Total pack years: 3.00    Types: Cigarettes, Cigars   Smokeless  tobacco: Never  Vaping Use   Vaping Use: Never used  Substance and Sexual Activity   Alcohol use: Not Currently   Drug use: Yes    Types: Marijuana    Comment: 1-2 a week   Sexual activity: Not on file  Other Topics Concern   Not on file  Social History Narrative   Lives with wife.   Social Determinants of Health   Financial Resource Strain: Low Risk  (12/25/2019)   Overall Financial Resource Strain (CARDIA)    Difficulty of Paying Living Expenses: Not hard at all  Food Insecurity: Not on file  Transportation Needs: Not on file  Physical Activity: Not on file  Stress: Not on file  Social Connections: Not on file  Intimate Partner Violence: Not on file    Family History:   The patient's family history includes Arthritis in his mother; Hypertension in his father; Stroke in his father and mother. There is no history of Cancer, COPD, Heart disease, or Kidney disease.    ROS:  Please see the history of present illness.  All other ROS reviewed and negative.     Physical Exam/Data:   Vitals:   08/10/22 0812  BP: (!) 140/87  Pulse: (!) 48  Resp: 16  Temp: (!) 97.1 F (36.2 C)  TempSrc: Temporal  SpO2: 100%  Weight: 95.7 kg  Height: '6\' 1"'$  (1.854 m)   No intake or output data in the 24 hours ending 08/10/22 0837    08/10/2022    8:12 AM 07/06/2022    8:33 AM 01/24/2022    7:42 AM  Last 3 Weights  Weight (lbs) 211 lb 214 lb 6.4 oz 210 lb 6.4 oz  Weight (kg) 95.709 kg 97.251 kg 95.437 kg     Body mass index is 27.84 kg/m.  General:  Well nourished, well developed, in no acute distress HEENT: normal Neck: no JVD Vascular: No carotid bruits; Distal pulses 2+ bilaterally   Cardiac:  normal S1, S2; RRR; no diastolic murmur 3/6 holosystolic murmur at the apex radiating mostly to the axilla, but also to the base Lungs:  clear to auscultation bilaterally, no wheezing, rhonchi or rales  Abd: soft, nontender, no hepatomegaly  Ext: no edema Musculoskeletal:  No deformities,  BUE and BLE strength normal and equal Skin: warm and dry  Neuro:  CNs 2-12 intact, no focal abnormalities noted Psych:  Normal affect    EKG:  The ECG that was done 07/06/2022 was personally reviewed and demonstrates sinus bradycardia  Relevant CV Studies:   1. Left ventricular ejection fraction, by estimation, is 60 to 65%. Left  ventricular ejection fraction by 3D volume is 65 %. The left ventricle has  normal function. The left ventricle has no regional wall motion  abnormalities. There is mild left  ventricular hypertrophy. Left ventricular diastolic parameters  are  indeterminate.   2. Right ventricular systolic function is normal. The right ventricular  size is mildly enlarged. There is normal pulmonary artery systolic  pressure.   3. Left atrial size was mildly dilated.   4. PISA 0.8cm. The mitral valve is myxomatous. Severe mitral valve  regurgitation. No evidence of mitral stenosis. There is severe  holosystolic prolapse of the middle scallop of the posterior leaflet of  the mitral valve.   5. The aortic valve is tricuspid. Aortic valve regurgitation is not  visualized. No aortic stenosis is present.   6. Pulmonic valve regurgitation is moderate.   7. The inferior vena cava is normal in size with greater than 50%  respiratory variability, suggesting right atrial pressure of 3 mmHg.   Comparison(s): Prior images reviewed side by side. Prior moderate to  severe MR.   Laboratory Data:  High Sensitivity Troponin:  No results for input(s): "TROPONINIHS" in the last 720 hours.    Chemistry Recent Labs  Lab 08/03/22 0832  NA 136  K 4.9  CL 103  CO2 23  GLUCOSE 110*  BUN 11  CREATININE 0.90  CALCIUM 9.7    No results for input(s): "PROT", "ALBUMIN", "AST", "ALT", "ALKPHOS", "BILITOT" in the last 168 hours. Lipids No results for input(s): "CHOL", "TRIG", "HDL", "LABVLDL", "LDLCALC", "CHOLHDL" in the last 168 hours. Hematology Recent Labs  Lab 08/03/22 0832  WBC  3.4  RBC 5.81*  HGB 16.0  HCT 47.5  MCV 82  MCH 27.5  MCHC 33.7  RDW 14.5  PLT 229   Thyroid No results for input(s): "TSH", "FREET4" in the last 168 hours. BNPNo results for input(s): "BNP", "PROBNP" in the last 168 hours.  DDimer No results for input(s): "DDIMER" in the last 168 hours.   Radiology/Studies:  No results found.   Assessment and Plan:   Mitral insufficiency, probably severe: Difficult to quantify by transthoracic echo.  Plan for TEE today.  He has no difficulty with swallowing or contraindications to this procedure.   Shared Decision Making/Informed Consent The risks [esophageal damage, perforation (1:10,000 risk), bleeding, pharyngeal hematoma as well as other potential complications associated with conscious sedation including aspiration, arrhythmia, respiratory failure and death], benefits (treatment guidance and diagnostic support) and alternatives of a transesophageal echocardiogram were discussed in detail with Mr. Gohman and he is willing to proceed.      For questions or updates, please contact [place] Please consult [email or web address] for contact info under     Signed, Sanda Klein, MD  08/10/2022 8:37 AM

## 2022-08-10 NOTE — Discharge Instructions (Signed)

## 2022-08-10 NOTE — Op Note (Signed)
INDICATIONS: Mitral insufficiency  PROCEDURE:   Informed consent was obtained prior to the procedure. The risks, benefits and alternatives for the procedure were discussed and the patient comprehended these risks.  Risks include, but are not limited to, cough, sore throat, vomiting, nausea, somnolence, esophageal and stomach trauma or perforation, bleeding, low blood pressure, aspiration, pneumonia, infection, trauma to the teeth and death.    After a procedural time-out, the oropharynx was anesthetized with 20% benzocaine spray.   During this procedure the patient was administered IV propofol by Anesthesiology, Dr. Gifford Shave.  The transesophageal probe was inserted in the esophagus and stomach without difficulty and multiple views were obtained.  The patient was kept under observation until the patient left the procedure room.  The patient left the procedure room in stable condition.   Agitated microbubble saline contrast was not administered.  COMPLICATIONS:    There were no immediate complications.  FINDINGS:  Moderate-to-severe myxomatous mitral valve disease. The posterior leaflet has unusual anatomy, with 2 major scallops separated by a deep fold, with significant prolapse of both scallops, but worse on the lateral side. There is severe mitral insufficiency with the largest jet being highly eccentric and oriented anteriorly and medially. There is systolic flow reversal in the right upper pulmonary vein, systolic blunting on the left. Normal LV size and function. Myxomatous tricuspid valve with prolapse, but only mild MR.  RECOMMENDATIONS:    Serial evaluation for LV dilation/dysfunction or symptom development. If MV repair is indicated, he is less likely to have a good result with TEER, may be better suited for surgical repair.  Time Spent Directly with the Patient:  45 minutes   Jonathan [name] 08/10/2022, 9:17 AM

## 2022-08-10 NOTE — Anesthesia Postprocedure Evaluation (Signed)
Anesthesia Post Note  Patient: Jonathan [name]  Procedure(s) Performed: TRANSESOPHAGEAL ECHOCARDIOGRAM (TEE)     Patient location during evaluation: Endoscopy Anesthesia Type: MAC Level of consciousness: awake and alert Pain management: pain level controlled Vital Signs Assessment: post-procedure vital signs reviewed and stable Respiratory status: spontaneous breathing, nonlabored ventilation, respiratory function stable and patient connected to nasal cannula oxygen Cardiovascular status: stable and blood pressure returned to baseline Postop Assessment: no apparent nausea or vomiting Anesthetic complications: no   No notable events documented.  Last Vitals:  Vitals:   08/10/22 0940 08/10/22 0945  BP: 137/89 135/84  Pulse: (!) 49 (!) 47  Resp: 14 13  Temp:    SpO2: 97% 97%    Last Pain:  Vitals:   08/10/22 0945  TempSrc:   PainSc: 0-No pain                 Santa Lighter

## 2022-08-10 NOTE — Transfer of Care (Signed)
Immediate Anesthesia Transfer of Care Note  Patient: Jonathan [name]  Procedure(s) Performed: TRANSESOPHAGEAL ECHOCARDIOGRAM (TEE)  Patient Location: Endoscopy Unit  Anesthesia Type:MAC  Level of Consciousness: awake and alert   Airway & Oxygen Therapy: Patient Spontanous Breathing and Patient connected to nasal cannula oxygen  Post-op Assessment: Report given to RN and Post -op Vital signs reviewed and stable  Post vital signs: Reviewed and stable  Last Vitals:  Vitals Value Taken Time  BP 126/87 08/10/22 0924  Temp    Pulse 56 08/10/22 0924  Resp 14 08/10/22 0924  SpO2 99 % 08/10/22 0924  Vitals shown include unvalidated device data.  Last Pain:  Vitals:   08/10/22 0812  TempSrc: Temporal  PainSc: 0-No pain         Complications: No notable events documented.

## 2022-08-12 ENCOUNTER — Encounter (HOSPITAL_COMMUNITY): Payer: Self-pay | Admitting: Cardiovascular Disease

## 2022-08-19 ENCOUNTER — Other Ambulatory Visit: Payer: Self-pay | Admitting: Internal Medicine

## 2022-08-19 DIAGNOSIS — I519 Heart disease, unspecified: Secondary | ICD-10-CM

## 2022-08-19 DIAGNOSIS — I10 Essential (primary) hypertension: Secondary | ICD-10-CM

## 2022-08-19 DIAGNOSIS — E785 Hyperlipidemia, unspecified: Secondary | ICD-10-CM

## 2022-10-16 ENCOUNTER — Other Ambulatory Visit: Payer: Self-pay | Admitting: Physician Assistant

## 2022-10-16 DIAGNOSIS — K2100 Gastro-esophageal reflux disease with esophagitis, without bleeding: Secondary | ICD-10-CM

## 2022-10-30 NOTE — Progress Notes (Unsigned)
  Cardiology Office Note:   Date:  11/01/2022  ID:  Jonathan [name], DOB 08-18-1955, MRN [id number] PCP: Ellyn Hack, MD  [facility] Providers Cardiologist:  [name], MD    History of Present Illness:   Jonathan [name] is a 67 y.o. male who I saw back in  2013. He was seen by Oris Drone NP in 2020 for evaluation of chronic diastolic HF.    He also has bradycardia.  He has had some chronic diastolic dysfunction.  In June 2022 I sent him for an echo with a myxomatous mitral valve prolapse of both leaflets.  The MR was at least moderate.  TEE confirmed this to be moderate and suggested continued follow-up.  ERO is 0.25 cm2, Regurgitant volume was 39 cc.  On follow up TTE this June he had moderate to severe MR.  LA size was 37 mm with an EF of 60 - 65%.    I sent him for a TEE.  This demonstrated moderate to severe MR.    He returns for follow up.  He returns for follow-up to discuss this.  I did review the echo images.  This does confirm at least moderately severe MR.  He still has well-preserved EF.  He does not really have any symptoms of chest discomfort, neck or arm discomfort.  He is not having any palpitations, presyncope or syncope.  Has had no weight gain or edema.  Of note he does walk slowly with back pain and joint pains.  He is here with her ex husband/best friend    ROS: As stated in the HPI and negative for all other systems.  Studies Reviewed:    EKG:  NA   Risk Assessment/Calculations:              Physical Exam:   VS:  BP 130/84 (BP Location: Left Arm, Patient Position: Sitting, Cuff Size: Normal)   Pulse 61   Ht 6\' 1"  (1.854 m)   Wt 221 lb 9.6 oz (100.5 kg)   SpO2 98%   BMI 29.24 kg/m    Wt Readings from Last 3 Encounters:  11/01/22 221 lb 9.6 oz (100.5 kg)  08/10/22 211 lb (95.7 kg)  07/06/22 214 lb 6.4 oz (97.3 kg)     GEN: Well nourished, well developed in no acute distress NECK: No JVD; No carotid bruits CARDIAC: RRR, 3  out of 6 apical and axillary somewhat late systolic murmur, no diastolic murmurs, rubs, gallops RESPIRATORY:  Clear to auscultation without rales, wheezing or rhonchi  ABDOMEN: Soft, non-tender, non-distended EXTREMITIES:  No edema; No deformity   ASSESSMENT AND PLAN:   MR:   The MR was moderate to severe.   Reviewed these images and he would have a repairable valve and is a good candidate.  I am to send him to see a cardiothoracic surgeon to discuss timing of mitral valve repair.    Bradycardia:     He has no symptoms related to this.  No change in therapy.   Essential hypertension: His blood pressure is at target.  No change in therapy.   Tobacco use: We have talked about stopping smoking.      Signed, [name], MD

## 2022-11-01 ENCOUNTER — Encounter: Payer: Self-pay | Admitting: Cardiology

## 2022-11-01 ENCOUNTER — Ambulatory Visit: Payer: 59 | Attending: Cardiology | Admitting: Cardiology

## 2022-11-01 VITALS — BP 130/84 | HR 61 | Ht 73.0 in | Wt 221.6 lb

## 2022-11-01 DIAGNOSIS — I10 Essential (primary) hypertension: Secondary | ICD-10-CM

## 2022-11-01 DIAGNOSIS — Z720 Tobacco use: Secondary | ICD-10-CM

## 2022-11-01 DIAGNOSIS — I340 Nonrheumatic mitral (valve) insufficiency: Secondary | ICD-10-CM

## 2022-11-01 DIAGNOSIS — R001 Bradycardia, unspecified: Secondary | ICD-10-CM

## 2022-11-01 NOTE — Patient Instructions (Signed)
  Follow-Up: At [facility], you and your health needs are our priority.  As part of our continuing mission to provide you with exceptional heart care, we have created designated Provider Care Teams.  These Care Teams include your primary Cardiologist (physician) and Advanced Practice Providers (APPs -  Physician Assistants and Nurse Practitioners) who all work together to provide you with the care you need, when you need it.  We recommend signing up for the patient portal called "MyChart".  Sign up information is provided on this After Visit Summary.  MyChart is used to connect with patients for Virtual Visits (Telemedicine).  Patients are able to view lab/test results, encounter notes, upcoming appointments, etc.  Non-urgent messages can be sent to your provider as well.   To learn more about what you can do with MyChart, go to [email or web address].    Your next appointment:   6 month(s)  Provider:   James [name], MD      

## 2022-11-07 ENCOUNTER — Other Ambulatory Visit: Payer: Self-pay | Admitting: Physician Assistant

## 2022-11-07 ENCOUNTER — Other Ambulatory Visit: Payer: Self-pay | Admitting: Internal Medicine

## 2022-11-07 DIAGNOSIS — J301 Allergic rhinitis due to pollen: Secondary | ICD-10-CM

## 2022-11-07 DIAGNOSIS — I10 Essential (primary) hypertension: Secondary | ICD-10-CM

## 2022-11-07 DIAGNOSIS — K2100 Gastro-esophageal reflux disease with esophagitis, without bleeding: Secondary | ICD-10-CM

## 2022-11-07 DIAGNOSIS — I519 Heart disease, unspecified: Secondary | ICD-10-CM

## 2022-11-20 NOTE — Progress Notes (Unsigned)
[address]       [place] [address]             [phone]           [facility] Medical Record #[id number] Date of Birth: 08-03-1955  [name], MD Ellyn Hack, MD  Chief Complaint:   Mitral Regurgitation  History of Present Illness:     Pt is a very pleasant 67 yo wm who has been followed for mitral regurgitation over the past several years. Pt has recently been evaluated by TEE this past March and with EF of 70% and now more severe MR flow reversal in one of the pulmonary veins. The LA is only mildly dilated and has no LV dilation. Pt has slow heart rates which he attributes to his extensive running history in the past. He has now both hips replaced and moves a bit slower but does continue to be very active. He has no SOB or fatigue. He says he feels "fantastic". He was asked to see me to discuss timing of surgery for him      Past Medical History:  Diagnosis Date   Anxiety    Arthritis    Avascular necrosis of bone (HCC)    left hip    Back pain, lumbosacral 2000   Blind right eye    Colon polyps 08/29/2011   Hyperplastic colon polyps   Mitral valve prolapse     Past Surgical History:  Procedure Laterality Date   BUBBLE STUDY  11/25/2020   Procedure: BUBBLE STUDY;  Surgeon: Sande Rives, MD;  Location: [facility] ENDOSCOPY;  Service: Cardiovascular;;   EYE SURGERY  2007   Fractured bones repaired/blind in right eye   HERNIA REPAIR     Right inguinal   SPINE SURGERY     TEE WITHOUT CARDIOVERSION N/A 11/25/2020   Procedure: TRANSESOPHAGEAL ECHOCARDIOGRAM (TEE);  Surgeon: Sande Rives, MD;  Location: [facility] ENDOSCOPY;  Service: Cardiovascular;  Laterality: N/A;   TEE WITHOUT CARDIOVERSION N/A 08/10/2022   Procedure: TRANSESOPHAGEAL ECHOCARDIOGRAM (TEE);  Surgeon: Thurmon Fair, MD;  Location: [facility] ENDOSCOPY;  Service: Cardiovascular;  Laterality: N/A;   TOTAL HIP ARTHROPLASTY Right 09/18/2014   Procedure:  RIGHT TOTAL HIP ARTHROPLASTY ANTERIOR APPROACH;  Surgeon: Samson Frederic, MD;  Location: [facility] ORS;  Service: Orthopedics;  Laterality: Right;   TOTAL HIP ARTHROPLASTY Left 04/06/2017   Procedure: LEFT TOTAL HIP ARTHROPLASTY ANTERIOR APPROACH;  Surgeon: Samson Frederic, MD;  Location: [facility] ORS;  Service: Orthopedics;  Laterality: Left;  Needs RNFA    Social History   Tobacco Use  Smoking Status Former   Packs/day: 0.10   Years: 30.00   Additional pack years: 0.00   Total pack years: 3.00   Types: Cigarettes, Cigars  Smokeless Tobacco Never    Social History   Substance and Sexual Activity  Alcohol Use Not Currently    Social History   Socioeconomic History   Marital status: Divorced    Spouse name: Not on file   Number of children: 1   Years of education: Not on file   Highest education level: Not on file  Occupational History    Employer: DISABLED  Tobacco Use   Smoking status: Former    Packs/day: 0.10    Years: 30.00    Additional pack years: 0.00    Total pack years: 3.00    Types: Cigarettes, Cigars   Smokeless tobacco: Never  Vaping Use   Vaping  Use: Never used  Substance and Sexual Activity   Alcohol use: Not Currently   Drug use: Yes    Types: Marijuana    Comment: 1-2 a week   Sexual activity: Not on file  Other Topics Concern   Not on file  Social History Narrative   Lives with wife.   Social Determinants of Health   Financial Resource Strain: Low Risk  (12/25/2019)   Overall Financial Resource Strain (CARDIA)    Difficulty of Paying Living Expenses: Not hard at all  Food Insecurity: Not on file  Transportation Needs: Not on file  Physical Activity: Not on file  Stress: Not on file  Social Connections: Not on file  Intimate Partner Violence: Not on file    Allergies  Allergen Reactions   Cortisone Rash    "act different"   Lipitor [Atorvastatin Calcium] Other (See Comments)    "side pain"    Current Outpatient Medications  Medication Sig  Dispense Refill   amLODipine (NORVASC) 10 MG tablet Take 10 mg by mouth daily.     aspirin 81 MG chewable tablet Chew 162 mg by mouth in the morning.     Azelastine-Fluticasone 137-50 MCG/ACT SUSP INSTILL 2 SPRAYS IN EACH NOSTRIL TWICE A DAY (Patient taking differently: Place 2 sprays into both nostrils daily as needed (allergies.).) 23 g 5   dexlansoprazole (DEXILANT) 60 MG capsule TAKE 1 CAPSULE (60 MG TOTAL) BY MOUTH DAILY. **PLEASE CALL OFFICE TO SCHEDULE FOLLOW UP 90 capsule 0   dutasteride (AVODART) 0.5 MG capsule Take 1 capsule (0.5 mg total) by mouth daily. 90 capsule 1   ezetimibe (ZETIA) 10 MG tablet Take 10 mg by mouth daily.     HYDROcodone-acetaminophen (NORCO) 10-325 MG tablet Take 1 tablet by mouth every 6 (six) hours as needed for moderate pain. 90 tablet 0   naloxone (NARCAN) nasal spray 4 mg/0.1 mL Place 1 spray into the nose as needed (opioid overdose). 2 each 2   Pitavastatin Calcium (LIVALO) 4 MG TABS Take 1 tablet (4 mg total) by mouth daily. 90 tablet 1   No current facility-administered medications for this visit.     Family History  Problem Relation Age of Onset   Stroke Mother    Arthritis Mother    Stroke Father    Hypertension Father    Cancer Neg Hx    COPD Neg Hx    Heart disease Neg Hx    Kidney disease Neg Hx        Physical Exam: Healthy appearing Lungs: clear Card: RR with 2/6 sem to axillae Ext: no edema Neuro: alert Teeth in good repair     Diagnostic Studies & Laboratory data: I have personally reviewed the following studies and agree with the findings   TEE (07/2022) IMPRESSIONS     1. Left ventricular ejection fraction, by estimation, is 65 to 70%. The  left ventricle has normal function. The left ventricle has no regional  wall motion abnormalities. The left ventricular internal cavity size was  mildly dilated. Left ventricular  diastolic parameters are consistent with Grade II diastolic dysfunction  (pseudonormalization).    2. Right ventricular systolic function is normal. The right ventricular  size is normal.   3. Left atrial size was mildly dilated. No left atrial/left atrial  appendage thrombus was detected.   4. The mitral valve has atypical morphology. There appear to be only two  scallops on the posterior leaflet (the true lateral scallop P1 is tiny or  absent). There  is a deep indentation/fold between the two posterior  scallops, giving the mitral valve an  appearance of being tricuspid. There is severe prolapse of the lateral  scallop (which is equivalent to P1 and P2) and moderate prolapse of the  medial scallop. There is a dominant jet of mitral insufficiency from the  prolapsing lateral scallop, with a  highly eccentric jet directed anteriorly and medially (vena contracta 5  mm, ERO area 0.35 cm sq by both the PISA method and 3D guided planimetry)  . There is a smaller, moderate jet of mitral insufficiency due to prolapse  of the smaller medial scallop.  There is also mild prolapse in the middle portion of the anterior leaflet  (A2). No ruptured chordae are seen. Pulmonary vein systolic flow reversal  is seen only in the right pulmonary veins, blunted flow is seen on the  left. 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 Moderate to severe mitral valve regurgitation. No evidence of  mitral stenosis. There is severe holosystolic prolapse of of the mitral  valve. The mean mitral valve gradient is 2.4 mmHg with average heart rate  of 66 bpm.   5. The tricuspid valve is myxomatous.   6. The aortic valve is tricuspid. Aortic valve regurgitation is not  visualized. No aortic stenosis is present.   FINDINGS   Left Ventricle: End-systolic diameter is 2.5 cm. Left ventricular  ejection fraction, by estimation, is 65 to 70%. The left ventricle has  normal function. The left ventricle has no regional wall motion  abnormalities. The left ventricular internal cavity  size was mildly dilated. Left ventricular  diastolic parameters are  consistent with Grade II diastolic dysfunction (pseudonormalization).   Right Ventricle: The right ventricular size is normal. No increase in  right ventricular wall thickness. Right ventricular systolic function is  normal.   Left Atrium: Left atrial size was mildly dilated. No left atrial/left  atrial appendage thrombus was detected.   Right Atrium: Right atrial size was normal in size.   Pericardium: There is no evidence of pericardial effusion.   Mitral Valve: The mitral valve has atypical morphology. There appear to be  only two scallops on the posterior leaflet (the true lateral scallop P1 is  tiny or absent). There is a deep indentation/fold between the two  posterior scallops, giving the mitral  valve an appearance of being tricuspid. There is severe prolapse of the  lateral scallop (which is equivalent to P1 and P2) and moderate prolapse  of the medial scallop. There is a dominant jet of mitral insufficiency  from the prolapsing lateral scallop,  with a highly eccentric jet directed anteriorly and medially (vena  contracta 5 mm, ERO area 0.35 cm sq by both the PISA method and 3D guided  planimetry) . There is a smaller, moderate jet of mitral insufficiency due  to prolapse of the smaller medial  scallop. There is also mild prolapse in the middle portion of the anterior  leaflet (A2). No ruptured chordae are seen. Pulmonary vein systolic flow  reversal is seen only in the right pulmonary veins, blunted flow is seen  on the left. 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 There is severe holosystolic prolapse of of the mitral valve.  Moderate to severe mitral valve regurgitation, with eccentric anteriorly  directed jet. No evidence of mitral valve stenosis. The mean mitral valve  gradient is 2.4 mmHg with average   heart rate of 66 bpm.   Tricuspid Valve: The tricuspid valve is myxomatous. Tricuspid valve  regurgitation is mild. There is mild prolapse of  the tricuspid.   Aortic Valve: The aortic valve is tricuspid. Aortic valve regurgitation is  not visualized. No aortic stenosis is present.   Pulmonic Valve: The pulmonic valve was normal in structure. Pulmonic valve  regurgitation is trivial.   Aorta: The aortic root, ascending aorta and aortic arch are all  structurally normal, with no evidence of dilitation or obstruction. There  is minimal (Grade I) plaque involving the descending aorta.   Venous: A pattern of systolic flow reversal, suggestive of severe mitral  regurgitation is recorded from the right upper pulmonary vein.   IAS/Shunts: No atrial level shunt detected by color flow Doppler.   Additional Comments: Spectral Doppler performed.   LEFT VENTRICLE  PLAX 2D  LVOT diam:     2.43 cm  LV SV:         85  LV SV Index:   39  LVOT Area:     4.64 cm    LV Volumes (MOD)  LV vol d, MOD A2C: 89.0 ml  LV vol s, MOD A2C: 30.7 ml  LV SV MOD A2C:     58.2 ml   AORTIC VALVE  LVOT Vmax:   96.20 cm/s  LVOT Vmean:  71.700 cm/s  LVOT VTI:    0.183 m   MITRAL VALVE  MV Area (plan): 7.31 cm      SHUNTS  MV Mean grad:   2.4 mmHg      Systemic VTI:  0.18 m  MR Peak grad:    109.6 mmHg   Systemic Diam: 2.43 cm  MR Mean grad:    47.0 mmHg  MR Vmax:         523.50 cm/s  MR Vmean:        313.0 cm/s  MR PISA:         5.09 cm  MR PISA Eff ROA: 33 mm  MR PISA Radius:  0.90 cm   Recent Radiology Findings:       Recent Lab Findings: Lab Results  Component Value Date   WBC 3.4 08/03/2022   HGB 16.0 08/03/2022   HCT 47.5 08/03/2022   PLT 229 08/03/2022   GLUCOSE 110 (H) 08/03/2022   CHOL 205 (H) 11/29/2021   TRIG 143.0 11/29/2021   HDL 51.30 11/29/2021   LDLDIRECT 131.6 07/14/2011   LDLCALC 125 (H) 11/29/2021   ALT 19 05/19/2020   AST 14 05/19/2020   NA 136 08/03/2022   K 4.9 08/03/2022   CL 103 08/03/2022   CREATININE 0.90 08/03/2022   BUN 11 08/03/2022   CO2 23 08/03/2022   TSH 1.607 12/12/2021   INR 0.97  09/08/2014   HGBA1C 6.1 11/29/2021      Assessment / Plan:     Pt with NYHA class 0 symptoms of now worsening MR due to posterior mitral valve prolapse. Pt has no atrial arrhythmias, cardiomyopathy, or evidence of PHTN. I believe the valve to be highly repairable and for now would continue to follow with serial echos. He does not seem eager to engage in a surgical operation at this time feeling like he does. If he gets a flail segment then we discussed I would be more aggressive of surgery at that point but will monitor him for symptoms or signs of ventricular dysfunciton, PHTN or atrial arrhythmias. Pt and wife seemed very happy with recommendations. He will need cath and CT of chest prior to intervention to determine if he is a mini  thorocotomy candidate   I have spent 60 min in review of the records, viewing studies and in face to face with patient and in coordination of future care    Eugenio Hoes 11/20/2022 11:48 AM

## 2022-11-21 ENCOUNTER — Institutional Professional Consult (permissible substitution) (INDEPENDENT_AMBULATORY_CARE_PROVIDER_SITE_OTHER): Payer: 59 | Admitting: Thoracic Surgery (Cardiothoracic Vascular Surgery)

## 2022-11-21 ENCOUNTER — Encounter: Payer: Self-pay | Admitting: Thoracic Surgery (Cardiothoracic Vascular Surgery)

## 2022-11-21 VITALS — BP 143/88 | HR 57 | Resp 18 | Ht 73.0 in | Wt 217.0 lb

## 2022-11-21 DIAGNOSIS — I340 Nonrheumatic mitral (valve) insufficiency: Secondary | ICD-10-CM | POA: Diagnosis not present

## 2022-11-21 NOTE — Patient Instructions (Signed)
Continue medical management Follow up prn with [place]

## 2023-01-03 ENCOUNTER — Ambulatory Visit: Payer: Medicaid Other | Admitting: Cardiology

## 2023-01-05 ENCOUNTER — Ambulatory Visit: Payer: Medicaid Other | Admitting: Cardiology

## 2023-03-06 ENCOUNTER — Telehealth: Payer: Self-pay | Admitting: Cardiology

## 2023-03-06 NOTE — Telephone Encounter (Signed)
Monitor printed and will be placed for dr hochrein to review.

## 2023-03-06 NOTE — Telephone Encounter (Signed)
Caller Samara Deist) stated patient is concerned regarding patient needs to have surgery for a leaky heart valve and patient is on Eliquis.  Caller stated they want a call back to discuss next steps.

## 2023-03-06 NOTE — Telephone Encounter (Signed)
Spoke with patient/wife. Patient was ordered a zio monitor by [facility] -- abnormal zio -- may have AFib and [facility] suggested Eliquis. Wife also reports patient has low HRs in the 30s. Advised that if he does have AFib, he should start blood thinner as he is at increased risk of stroke.  There is a monitor uploaded under media but unable to access this report. Wife wants Dr. Antoine Poche to review monitor report before patient starts on Eliquis.   Patient has had a surgical consult with Dr. Leafy Ro for MV surgery in June   [facility] Phone: [phone] Call made to this office to request monitor be re-faxed as unable to access  Patient's wife is also requesting a sooner appointment  Routed to MD/RN about monitor report review.

## 2023-03-07 NOTE — Telephone Encounter (Signed)
    Pt's wife is calling to follow up 

## 2023-03-08 NOTE — Telephone Encounter (Signed)
Aware dr Antoine Poche is not in the office until tomorrow

## 2023-03-09 MED ORDER — APIXABAN 5 MG PO TABS: 5.0000 mg | ORAL_TABLET | Freq: Two times a day (BID) | ORAL | Status: AC

## 2023-03-09 NOTE — Addendum Note (Signed)
Addended by: Freddi Starr on: 03/09/2023 10:50 AM   Modules accepted: Orders

## 2023-03-09 NOTE — Telephone Encounter (Signed)
Spoke with pt wife, dr hochrein has reviewed the strips of the monitor and there is a fib and pauses. The patient is given the okay to start the eliquis 5 mg twice daily. Per dr Antoine Poche, he will see the a fib clinic tomorrow. They have a follow up appointment in December with dr hochrein.

## 2023-03-10 ENCOUNTER — Encounter (HOSPITAL_COMMUNITY): Payer: Self-pay | Admitting: Physician Assistant

## 2023-03-10 ENCOUNTER — Ambulatory Visit (HOSPITAL_COMMUNITY)
Admission: RE | Admit: 2023-03-10 | Discharge: 2023-03-10 | Disposition: A | Discharge status: Home / Self Care | Payer: 59 | Source: Ambulatory Visit | Attending: Physician Assistant | Admitting: Physician Assistant

## 2023-03-10 VITALS — BP 110/82 | HR 45 | Ht 73.0 in | Wt 213.6 lb

## 2023-03-10 DIAGNOSIS — I340 Nonrheumatic mitral (valve) insufficiency: Secondary | ICD-10-CM | POA: Diagnosis not present

## 2023-03-10 DIAGNOSIS — I10 Essential (primary) hypertension: Secondary | ICD-10-CM | POA: Diagnosis not present

## 2023-03-10 DIAGNOSIS — Z7901 Long term (current) use of anticoagulants: Secondary | ICD-10-CM | POA: Diagnosis not present

## 2023-03-10 DIAGNOSIS — R9431 Abnormal electrocardiogram [ECG] [EKG]: Secondary | ICD-10-CM | POA: Diagnosis not present

## 2023-03-10 DIAGNOSIS — I4891 Unspecified atrial fibrillation: Secondary | ICD-10-CM | POA: Diagnosis present

## 2023-03-10 DIAGNOSIS — D6869 Other thrombophilia: Secondary | ICD-10-CM | POA: Diagnosis not present

## 2023-03-10 DIAGNOSIS — I480 Paroxysmal atrial fibrillation: Secondary | ICD-10-CM | POA: Insufficient documentation

## 2023-03-10 NOTE — Progress Notes (Signed)
Primary Care Physician: Ellyn Hack, MD Primary Cardiologist: [name], MD Electrophysiologist: None  Referring Physician: Dr Phillis Haggis III is a 67 y.o. male with a history of HTN, HLD, tobacco abuse, MVP with moderate to severe MR, atrial fibrillation who presents for follow up in the [facility].  Patient wore a Zio monitor 01/2023 which showed 25% afib burden, avg HR 69 bpm but with periods of RVR. Patient was started on Eliquis for a CHADS2VASC score of 2.  On follow up today, patient reports that he was asymptomatic and unaware of his afib. He is in SR today. He denies significant snoring or alcohol use.   Today, he denies symptoms of palpitations, chest pain, shortness of breath, orthopnea, PND, lower extremity edema, dizziness, presyncope, syncope, snoring, daytime somnolence, bleeding, or neurologic sequela. The patient is tolerating medications without difficulties and is otherwise without complaint today.    Atrial Fibrillation Risk Factors:  he does not have symptoms or diagnosis of sleep apnea. he does not have a history of rheumatic fever. he does not have a history of alcohol use. The patient does have a history of early familial atrial fibrillation or other arrhythmias. Mother had afib.   Atrial Fibrillation Management history:  Previous antiarrhythmic drugs: none Previous cardioversions: none Previous ablations: none Anticoagulation history: Eliquis  ROS- All systems are reviewed and negative except as per the HPI above.  Past Medical History:  Diagnosis Date   Anxiety    Arthritis    Avascular necrosis of bone (HCC)    left hip    Back pain, lumbosacral 2000   Blind right eye    Colon polyps 08/29/2011   Hyperplastic colon polyps   Mitral valve prolapse     Current Outpatient Medications  Medication Sig Dispense Refill   amLODipine (NORVASC) 10 MG tablet Take 10 mg by mouth daily.      apixaban (ELIQUIS) 5 MG TABS tablet Take 1 tablet (5 mg total) by mouth 2 (two) times daily.     Azelastine-Fluticasone 137-50 MCG/ACT SUSP INSTILL 2 SPRAYS IN EACH NOSTRIL TWICE A DAY (Patient taking differently: Place 2 sprays into both nostrils daily as needed (allergies.).) 23 g 5   dexlansoprazole (DEXILANT) 60 MG capsule TAKE 1 CAPSULE (60 MG TOTAL) BY MOUTH DAILY. **PLEASE CALL OFFICE TO SCHEDULE FOLLOW UP 90 capsule 0   dutasteride (AVODART) 0.5 MG capsule Take 1 capsule (0.5 mg total) by mouth daily. 90 capsule 1   ezetimibe (ZETIA) 10 MG tablet Take 10 mg by mouth daily.     HYDROcodone-acetaminophen (NORCO) 10-325 MG tablet Take 1 tablet by mouth every 6 (six) hours as needed for moderate pain. 90 tablet 0   naloxone (NARCAN) nasal spray 4 mg/0.1 mL Place 1 spray into the nose as needed (opioid overdose). 2 each 2   Pitavastatin Calcium (LIVALO) 4 MG TABS Take 1 tablet (4 mg total) by mouth daily. 90 tablet 1   pravastatin (PRAVACHOL) 40 MG tablet TAKE 1 TABLET BY MOUTH EVERY DAY AT BEDTIME FOR CHOLESTEROL     No current facility-administered medications for this encounter.    Physical Exam: BP 110/82   Pulse (!) 45   Ht 6\' 1"  (1.854 m)   Wt 96.9 kg   BMI 28.18 kg/m   GEN: Well nourished, well developed in no acute distress NECK: No JVD; No carotid bruits CARDIAC: Regular rate and rhythm, no murmurs, rubs, gallops RESPIRATORY:  Clear to  auscultation without rales, wheezing or rhonchi  ABDOMEN: Soft, non-tender, non-distended EXTREMITIES:  No edema; No deformity   Wt Readings from Last 3 Encounters:  03/10/23 96.9 kg  11/21/22 98.4 kg  11/01/22 100.5 kg     EKG today demonstrates  SB Vent. rate 45 BPM PR interval 196 ms QRS duration 98 ms QT/QTcB 422/365 ms   Echo 08/10/22 demonstrated   1. Left ventricular ejection fraction, by estimation, is 65 to 70%. The  left ventricle has normal function. The left ventricle has no regional  wall motion abnormalities. The  left ventricular internal cavity size was  mildly dilated. Left ventricular diastolic parameters are consistent with Grade II diastolic dysfunction (pseudonormalization).   2. Right ventricular systolic function is normal. The right ventricular  size is normal.   3. Left atrial size was mildly dilated. No left atrial/left atrial  appendage thrombus was detected.   4. The mitral valve has atypical morphology. There appear to be only two  scallops on the posterior leaflet (the true lateral scallop P1 is tiny or  absent). There is a deep indentation/fold between the two posterior  scallops, giving the mitral valve an appearance of being tricuspid. There is severe prolapse of the lateral scallop (which is equivalent to P1 and P2) and moderate prolapse of the medial scallop. There is a dominant jet of mitral insufficiency from the prolapsing lateral scallop, with a highly eccentric jet directed anteriorly and medially (vena contracta 5 mm, ERO area 0.35 cm sq by both the PISA method and 3D guided planimetry). There is a smaller, moderate jet of mitral insufficiency due to prolapse of the smaller medial scallop. There is also mild prolapse in the middle portion of the anterior leaflet  (A2). No ruptured chordae are seen. Pulmonary vein systolic flow reversal  is seen only in the right pulmonary veins, blunted flow is seen on the  left. The mitral valve is myxomatous. Moderate to severe mitral valve regurgitation. No evidence of mitral stenosis. There is severe holosystolic prolapse of of the mitral valve. The mean mitral valve gradient is 2.4 mmHg with average heart rate of 66 bpm.   5. The tricuspid valve is myxomatous.   6. The aortic valve is tricuspid. Aortic valve regurgitation is not  visualized. No aortic stenosis is present.    CHA2DS2-VASc Score = 2  The patient's score is based upon: CHF History: 0 HTN History: 1 Diabetes History: 0 Stroke History: 0 Vascular Disease History: 0 Age  Score: 1 Gender Score: 0       ASSESSMENT AND PLAN: Paroxysmal Atrial Fibrillation (ICD10:  I48.0) The patient's CHA2DS2-VASc score is 2, indicating a 2.2% annual risk of stroke.   General education about afib provided and questions answered. We also discussed his stroke risk and the risks and benefits of anticoagulation. Zio monitor showed 25% afib burden, overall rate controlled but with periods of RVR. Baseline bradycardia limits medication options. ? If afib is related to MV disease. Will have him discuss with Dr Antoine Poche if surgery would now be indicated with new diagnosis of afib. If further rhythm control is needed, could consider dofetilide or ablation.  Continue Eliquis 5 mg BID  Secondary Hypercoagulable State (ICD10:  D68.69) The patient is at significant risk for stroke/thromboembolism based upon his CHA2DS2-VASc Score of 2.  Continue Apixaban (Eliquis).   HTN Stable on current regimen  VHD MVP with moderate to severe MR    Follow up with Dr Antoine Poche as scheduled.  Jorja Loa PA-C Afib Clinic [facility] [address] [place], [place] [address] [phone]

## 2023-03-10 NOTE — Patient Instructions (Signed)
Stop aspirin.

## 2023-03-23 ENCOUNTER — Other Ambulatory Visit: Payer: Self-pay | Admitting: Physician Assistant

## 2023-03-23 DIAGNOSIS — K2100 Gastro-esophageal reflux disease with esophagitis, without bleeding: Secondary | ICD-10-CM

## 2023-03-30 ENCOUNTER — Other Ambulatory Visit: Payer: Self-pay | Admitting: Physician Assistant

## 2023-03-30 DIAGNOSIS — K2100 Gastro-esophageal reflux disease with esophagitis, without bleeding: Secondary | ICD-10-CM

## 2023-05-08 NOTE — Progress Notes (Unsigned)
  Cardiology Office Note:   Date:  05/08/2023  ID:  Jonathan [name], DOB [birth year], MRN [id number] PCP: Ellyn Hack, MD  [facility] Providers Cardiologist:  [name], MD {  History of Present Illness:   Jonathan [name] is a 67 y.o. male  who I saw back in  2013. He was seen by Oris Drone NP in 2020 for evaluation of chronic diastolic HF.    He also has bradycardia.  He has had some chronic diastolic dysfunction.  In June 2022 I sent him for an echo with a myxomatous mitral valve prolapse of both leaflets.  The MR was at least moderate.  TEE confirmed this to be moderate and suggested continued follow-up.  ERO is 0.25 cm2, Regurgitant volume was 39 cc.  On follow up TTE this June he had moderate to severe MR.  LA size was 37 mm with an EF of 60 - 65%.    I sent him for a TEE.  This demonstrated moderate to severe MR.    He was seen by Dr. Leafy Ro in June 2024 and the decision was to follow him clinically.  He has since developed atrial fib.  ***   He returns for follow up.  ***   *** He returns for follow-up to discuss this.  I did review the echo images.  This does confirm at least moderately severe MR.  He still has well-preserved EF.  He does not really have any symptoms of chest discomfort, neck or arm discomfort.  He is not having any palpitations, presyncope or syncope.  Has had no weight gain or edema.  Of note he does walk slowly with back pain and joint pains.   He is here with her ex husband/best friend  ROS: ***  Studies Reviewed:    EKG:       ***  Risk Assessment/Calculations:   {Does this patient have ATRIAL FIBRILLATION?[phone]} No BP recorded.  {Refresh Note OR Click here to enter BP  :1}***        Physical Exam:   VS:  There were no vitals taken for this visit.   Wt Readings from Last 3 Encounters:  03/10/23 213 lb 9.6 oz (96.9 kg)  11/21/22 217 lb (98.4 kg)  11/01/22 221 lb 9.6 oz (100.5 kg)     GEN: Well nourished, well  developed in no acute distress NECK: No JVD; No carotid bruits CARDIAC: ***RR, *** murmurs, rubs, gallops RESPIRATORY:  Clear to auscultation without rales, wheezing or rhonchi  ABDOMEN: Soft, non-tender, non-distended EXTREMITIES:  No edema; No deformity   ASSESSMENT AND PLAN:   MR:   The MR was moderate to severe.  ***    Reviewed these images and he would have a repairable valve and is a good candidate.  I am to send him to see a cardiothoracic surgeon to discuss timing of mitral valve repair.     Bradycardia:     ***  He has no symptoms related to this.  No change in therapy.   Essential hypertension:  ***   His blood pressure is at target.  No change in therapy.   Tobacco use:  ***  We have talked about stopping smoking.  Atrial fib:  ***        Follow up ***  Signed, [name], MD

## 2023-05-11 ENCOUNTER — Encounter: Payer: Self-pay | Admitting: Cardiology

## 2023-05-11 ENCOUNTER — Ambulatory Visit: Payer: 59 | Attending: Cardiology | Admitting: Cardiology

## 2023-05-11 VITALS — BP 126/84 | HR 68 | Ht 73.0 in | Wt 213.6 lb

## 2023-05-11 DIAGNOSIS — R001 Bradycardia, unspecified: Secondary | ICD-10-CM

## 2023-05-11 DIAGNOSIS — I341 Nonrheumatic mitral (valve) prolapse: Secondary | ICD-10-CM | POA: Diagnosis not present

## 2023-05-11 DIAGNOSIS — I10 Essential (primary) hypertension: Secondary | ICD-10-CM

## 2023-05-11 DIAGNOSIS — Z720 Tobacco use: Secondary | ICD-10-CM

## 2023-05-11 DIAGNOSIS — I480 Paroxysmal atrial fibrillation: Secondary | ICD-10-CM

## 2023-05-11 NOTE — Patient Instructions (Addendum)
Medication Instructions:  No changes.  *If you need a refill on your cardiac medications before your next appointment, please call your pharmacy*   Follow-Up: At [facility], you and your health needs are our priority.  As part of our continuing mission to provide you with exceptional heart care, we have created designated Provider Care Teams.  These Care Teams include your primary Cardiologist (physician) and Advanced Practice Providers (APPs -  Physician Assistants and Nurse Practitioners) who all work together to provide you with the care you need, when you need it.  Your next appointment:   6 month(s)  Provider:   [name], MD     CBC ordered after patient completed his office visit. Lab slip mailed. [facility] message sent.

## 2023-05-23 LAB — CBC
Hematocrit: 50.8 % (ref 37.5–51.0)
Hemoglobin: 16.9 g/dL (ref 13.0–17.7)
MCH: 28 pg (ref 26.6–33.0)
MCHC: 33.3 g/dL (ref 31.5–35.7)
MCV: 84 fL (ref 79–97)
Platelets: 255 10*3/uL (ref 150–450)
RBC: 6.04 x10E6/uL — ABNORMAL HIGH (ref 4.14–5.80)
RDW: 14.4 % (ref 11.6–15.4)
WBC: 3.3 10*3/uL — ABNORMAL LOW (ref 3.4–10.8)

## 2023-06-20 ENCOUNTER — Other Ambulatory Visit: Payer: Self-pay | Admitting: Registered Nurse

## 2023-06-20 DIAGNOSIS — Z0000 Encounter for general adult medical examination without abnormal findings: Secondary | ICD-10-CM

## 2023-06-20 DIAGNOSIS — M545 Low back pain, unspecified: Secondary | ICD-10-CM

## 2023-06-21 ENCOUNTER — Ambulatory Visit
Admission: RE | Admit: 2023-06-21 | Discharge: 2023-06-21 | Disposition: A | Discharge status: Home / Self Care | Payer: 59 | Source: Ambulatory Visit | Attending: Registered Nurse

## 2023-06-21 ENCOUNTER — Other Ambulatory Visit: Payer: Self-pay | Admitting: Registered Nurse

## 2023-06-21 DIAGNOSIS — M549 Dorsalgia, unspecified: Secondary | ICD-10-CM

## 2023-07-23 ENCOUNTER — Other Ambulatory Visit: Payer: 59

## 2023-07-31 NOTE — Progress Notes (Unsigned)
 Cardiology Office Note:   Date:  08/02/2023  ID:  Jonathan [name], DOB [birth year], MRN [id number] PCP: Ellyn Hack, MD  [facility] Providers Cardiologist:  [name], MD {  History of Present Illness:   Jonathan [name] is a 68 y.o. male who I saw back in  2013. He was seen by Oris Drone NP in 2020 for evaluation of chronic diastolic HF.    He also has bradycardia.  He has had some chronic diastolic dysfunction.  In June 2022 I sent him for an echo with a myxomatous mitral valve prolapse of both leaflets.  The MR was at least moderate.  TEE confirmed this to be moderate and suggested continued follow-up.  ERO is 0.25 cm2, Regurgitant volume was 39 cc.  On follow up TTE this June he had moderate to severe MR.  LA size was 37 mm with an EF of 60 - 65%.    I sent him for a TEE.  This demonstrated moderate to severe MR.    He was seen by Dr. Leafy Ro in June 2024 and the decision was to follow him clinically.  He has since developed atrial fib.     He returns for follow up.  He does not feel his atrial fibrillation.  He is tolerating anticoagulation.   He said that last year he was able to push mow an embankment he has in his yard.  He would do this for a little bit and do his usual yard work including weed eating.  He was not getting any chest pressure, neck or arm discomfort.  He was not having any new shortness of breath, PND or orthopnea.  He was not having any new palpitations, presyncope or syncope.  He has had no weight gain or edema.  He works on automobiles.  He has had some leg pain which she ascribes to his pravastatin.  ROS: As stated in the HPI and negative for all other systems.  Studies Reviewed:    EKG:   EKG Interpretation Date/Time:  Wednesday August 02 2023 16:34:25 EST Ventricular Rate:  60 PR Interval:    QRS Duration:  90 QT Interval:  428 QTC Calculation: 428 R Axis:   85  Text Interpretation: Atrial fibrillation Anteroseptal infarct (cited  on or before 11-Dec-2021) When compared with ECG of 10-Mar-2023 10:32, Atrial fibrillation has replaced Sinus rhythm  Poor anterior R wave progression Confirmed by [name] ([id number]) on 08/02/2023 5:40:58 PM    Risk Assessment/Calculations:    CHA2DS2-VASc Score = 2   This indicates a 2.2% annual risk of stroke. The patient's score is based upon: CHF History: 0 HTN History: 1 Diabetes History: 0 Stroke History: 0 Vascular Disease History: 0 Age Score: 1 Gender Score: 0    Physical Exam:   VS:  BP 138/80 (BP Location: Left Arm, Patient Position: Sitting, Cuff Size: Normal)   Pulse 89   Ht 6' 1.5" (1.867 m)   Wt 208 lb 9.6 oz (94.6 kg)   SpO2 97%   BMI 27.15 kg/m    Wt Readings from Last 3 Encounters:  08/02/23 208 lb 9.6 oz (94.6 kg)  05/11/23 213 lb 9.6 oz (96.9 kg)  03/10/23 213 lb 9.6 oz (96.9 kg)     GEN: Well nourished, well developed in no acute distress NECK: No JVD; No carotid bruits CARDIAC: Irregular RR, late systolic murmur heard at the apex and axilla, no diastolic murmurs, rubs, gallops RESPIRATORY:  Clear to auscultation  without rales, wheezing or rhonchi  ABDOMEN: Soft, non-tender, non-distended EXTREMITIES:  No edema; No deformity   ASSESSMENT AND PLAN:   MR: He has now had development of atrial fibrillation since he was seen by Dr. Leafy Ro.   I am going to repeat an echocardiogram but have a low threshold to send him back for reevaluation for need for elective valve surgery.    Bradycardia:   He has no symptoms related to this.  He does go low when he is resting but has had no presyncope or syncope.  He is in atrial fibrillation now.  We will follow this.       Essential hypertension: His blood pressure is at target.  No change in therapy.   Tobacco use:   He understands need to stop smoking completely.   Atrial fib: He is tolerating anticoagulation.  He has rate control.  This has been paroxysmal.  No change in therapy.  Leg pain: He has some leg  pain and thinks this could be related to pravastatin.  I suggested that he could have a pravastatin holiday though I think it is unlikely this is related.  He will stop it for couple of months and see if his leg pain goes away.  I given him some instructions about restarting it.       Follow up with me in six months.   Signed, [name], MD

## 2023-08-02 ENCOUNTER — Ambulatory Visit: Payer: 59 | Attending: Cardiology | Admitting: Cardiology

## 2023-08-02 ENCOUNTER — Encounter: Payer: Self-pay | Admitting: Cardiology

## 2023-08-02 VITALS — BP 138/80 | HR 89 | Ht 73.5 in | Wt 208.6 lb

## 2023-08-02 DIAGNOSIS — R001 Bradycardia, unspecified: Secondary | ICD-10-CM

## 2023-08-02 DIAGNOSIS — I10 Essential (primary) hypertension: Secondary | ICD-10-CM | POA: Diagnosis not present

## 2023-08-02 DIAGNOSIS — I480 Paroxysmal atrial fibrillation: Secondary | ICD-10-CM

## 2023-08-02 DIAGNOSIS — I341 Nonrheumatic mitral (valve) prolapse: Secondary | ICD-10-CM | POA: Diagnosis not present

## 2023-08-02 NOTE — Patient Instructions (Signed)
 Medication Instructions:  NO changes.  *If you need a refill on your cardiac medications before your next appointment, please call your pharmacy*  Testing/Procedures: Your physician has requested that you have an echocardiogram. Echocardiography is a painless test that uses sound waves to create images of your heart. It provides your doctor with information about the size and shape of your heart and how well your heart's chambers and valves are working. This procedure takes approximately one hour. There are no restrictions for this procedure. Please do NOT wear cologne, perfume, aftershave, or lotions (deodorant is allowed). Please arrive 15 minutes prior to your appointment time.  Please note: We ask at that you not bring children with you during ultrasound (echo/ vascular) testing. Due to room size and safety concerns, children are not allowed in the ultrasound rooms during exams. Our front office staff cannot provide observation of children in our lobby area while testing is being conducted. An adult accompanying a patient to their appointment will only be allowed in the ultrasound room at the discretion of the ultrasound technician under special circumstances. We apologize for any inconvenience.    Follow-Up: At [facility], you and your health needs are our priority.  As part of our continuing mission to provide you with exceptional heart care, we have created designated Provider Care Teams.  These Care Teams include your primary Cardiologist (physician) and Advanced Practice Providers (APPs -  Physician Assistants and Nurse Practitioners) who all work together to provide you with the care you need, when you need it.  We recommend signing up for the patient portal called "MyChart".  Sign up information is provided on this After Visit Summary.  MyChart is used to connect with patients for Virtual Visits (Telemedicine).  Patients are able to view lab/test results, encounter notes,  upcoming appointments, etc.  Non-urgent messages can be sent to your provider as well.   To learn more about what you can do with MyChart, go to [email or web address].    Your next appointment:   6 month(s)  Provider:   [name], MD     Other Instructions

## 2023-08-25 ENCOUNTER — Ambulatory Visit (HOSPITAL_COMMUNITY): Attending: Cardiology

## 2023-08-25 DIAGNOSIS — I341 Nonrheumatic mitral (valve) prolapse: Secondary | ICD-10-CM | POA: Diagnosis present

## 2023-08-25 LAB — ECHOCARDIOGRAM COMPLETE
MV M vel: 4.85 m/s
MV Peak grad: 94 mmHg
S' Lateral: 3 cm

## 2023-11-06 DIAGNOSIS — I4819 Other persistent atrial fibrillation: Secondary | ICD-10-CM | POA: Insufficient documentation

## 2023-11-06 NOTE — Progress Notes (Unsigned)
 Cardiology Office Note:   Date:  11/06/2023  ID:  Jonathan [name], DOB 15-Jul-1955, MRN [id number] PCP: Ulysees Gander, MD  [facility] Providers Cardiologist:  Eilleen Grates, MD {  History of Present Illness:   Jonathan [name] is a 68 y.o. male who I saw back in  2013. He was seen by Jonathan Berry NP in 2020 for evaluation of chronic diastolic HF.    He also has bradycardia.  He has had some chronic diastolic dysfunction.  In June 2022 I sent him for an echo with a myxomatous mitral valve prolapse of both leaflets.  The MR was at least moderate.  TEE confirmed this to be moderate and suggested continued follow-up.  ERO is 0.25 cm2, Regurgitant volume was 39 cc.  On follow up TTE this June he had moderate to severe MR.  LA size was 37 mm with an EF of 60 - 65%.    I sent him for a TEE.  This demonstrated moderate to severe MR.    He was seen by Dr. Honey Lusty in June 2024 and the decision was to follow him clinically.  He has since developed atrial fib.   He had follow up echo in March and had severe MR.    He returns for follow up.  ***   *** He does not feel his atrial fibrillation.  He is tolerating anticoagulation.   He said that last year he was able to push mow an embankment he has in his yard.  He would do this for a little bit and do his usual yard work including weed eating.  He was not getting any chest pressure, neck or arm discomfort.  He was not having any new shortness of breath, PND or orthopnea.  He was not having any new palpitations, presyncope or syncope.  He has had no weight gain or edema.  He works on automobiles.  He has had some leg pain which she ascribes to his pravastatin .    ROS: ***  Studies Reviewed:    EKG:       ***  Risk Assessment/Calculations:   {Does this patient have ATRIAL FIBRILLATION?:[phone]} No BP recorded.  {Refresh Note OR Click here to enter BP  :1}***        Physical Exam:   VS:  There were no vitals taken for this  visit.   Wt Readings from Last 3 Encounters:  08/02/23 208 lb 9.6 oz (94.6 kg)  05/11/23 213 lb 9.6 oz (96.9 kg)  03/10/23 213 lb 9.6 oz (96.9 kg)     GEN: Well nourished, well developed in no acute distress NECK: No JVD; No carotid bruits CARDIAC: ***RR, *** murmurs, rubs, gallops RESPIRATORY:  Clear to auscultation without rales, wheezing or rhonchi  ABDOMEN: Soft, non-tender, non-distended EXTREMITIES:  No edema; No deformity   ASSESSMENT AND PLAN:    MR:  This is severe.  ***   He has now had development of atrial fibrillation since he was seen by Dr. Honey Lusty.   I am going to repeat an echocardiogram but have a low threshold to send him back for reevaluation for need for elective valve surgery.     Bradycardia:   ***  He has no symptoms related to this.  He does go low when he is resting but has had no presyncope or syncope.  He is in atrial fibrillation now.  We will follow this.       Essential hypertension: His blood  pressure is ***  at target.  No change in therapy.    Tobacco use:  ***   He understands need to stop smoking completely.   Atrial fib: He is tolerating anticoagulation.  ***   He has rate control.  This has been paroxysmal.  No change in therapy.   Leg pain:  ***  He has some leg pain and thinks this could be related to pravastatin .  I suggested that he could have a pravastatin  holiday though I think it is unlikely this is related.  He will stop it for couple of months and see if his leg pain goes away.  I given him some instructions about restarting it.          Follow up ***  Signed, Eilleen Grates, MD

## 2023-11-08 ENCOUNTER — Ambulatory Visit: Attending: Cardiology | Admitting: Cardiology

## 2023-11-08 ENCOUNTER — Encounter: Payer: Self-pay | Admitting: Cardiology

## 2023-11-08 VITALS — BP 114/74 | HR 64 | Ht 74.5 in | Wt 204.0 lb

## 2023-11-08 DIAGNOSIS — I340 Nonrheumatic mitral (valve) insufficiency: Secondary | ICD-10-CM | POA: Diagnosis not present

## 2023-11-08 DIAGNOSIS — I4819 Other persistent atrial fibrillation: Secondary | ICD-10-CM | POA: Diagnosis not present

## 2023-11-08 DIAGNOSIS — R001 Bradycardia, unspecified: Secondary | ICD-10-CM | POA: Diagnosis not present

## 2023-11-08 DIAGNOSIS — I10 Essential (primary) hypertension: Secondary | ICD-10-CM

## 2023-11-08 DIAGNOSIS — Z720 Tobacco use: Secondary | ICD-10-CM

## 2023-11-08 NOTE — Patient Instructions (Signed)
 Medication Instructions:  Stop Pitavastatin  *If you need a refill on your cardiac medications before your next appointment, please call your pharmacy*  Lab Work: NONE If you have labs (blood work) drawn today and your tests are completely normal, you will receive your results only by: MyChart Message (if you have MyChart) OR A paper copy in the mail If you have any lab test that is abnormal or we need to change your treatment, we will call you to review the results.  Testing/Procedures: NONE  Follow-Up: At [facility], you and your health needs are our priority.  As part of our continuing mission to provide you with exceptional heart care, our providers are all part of one team.  This team includes your primary Cardiologist (physician) and Advanced Practice Providers or APPs (Physician Assistants and Nurse Practitioners) who all work together to provide you with the care you need, when you need it.  Your next appointment:   4 month(s)  Provider:   Eilleen Grates, MD    We recommend signing up for the patient portal called MyChart.  Sign up information is provided on this After Visit Summary.  MyChart is used to connect with patients for Virtual Visits (Telemedicine).  Patients are able to view lab/test results, encounter notes, upcoming appointments, etc.  Non-urgent messages can be sent to your provider as well.   To learn more about what you can do with MyChart, go to [email or web address].

## 2024-01-01 ENCOUNTER — Encounter (HOSPITAL_COMMUNITY): Payer: Self-pay

## 2024-01-01 ENCOUNTER — Other Ambulatory Visit: Payer: Self-pay

## 2024-01-01 ENCOUNTER — Telehealth: Payer: Self-pay | Admitting: Cardiology

## 2024-01-01 ENCOUNTER — Observation Stay (HOSPITAL_COMMUNITY)
Admission: EM | Admit: 2024-01-01 | Discharge: 2024-01-02 | Disposition: A | Discharge status: Home / Self Care | Source: Ambulatory Visit | Attending: Cardiovascular Disease | Admitting: Cardiovascular Disease

## 2024-01-01 DIAGNOSIS — Z96643 Presence of artificial hip joint, bilateral: Secondary | ICD-10-CM | POA: Diagnosis not present

## 2024-01-01 DIAGNOSIS — N401 Enlarged prostate with lower urinary tract symptoms: Secondary | ICD-10-CM

## 2024-01-01 DIAGNOSIS — Z87891 Personal history of nicotine dependence: Secondary | ICD-10-CM | POA: Diagnosis not present

## 2024-01-01 DIAGNOSIS — I4891 Unspecified atrial fibrillation: Principal | ICD-10-CM

## 2024-01-01 DIAGNOSIS — I10 Essential (primary) hypertension: Secondary | ICD-10-CM | POA: Insufficient documentation

## 2024-01-01 DIAGNOSIS — Z79899 Other long term (current) drug therapy: Secondary | ICD-10-CM | POA: Diagnosis not present

## 2024-01-01 DIAGNOSIS — Z7901 Long term (current) use of anticoagulants: Secondary | ICD-10-CM | POA: Insufficient documentation

## 2024-01-01 DIAGNOSIS — I340 Nonrheumatic mitral (valve) insufficiency: Secondary | ICD-10-CM

## 2024-01-01 DIAGNOSIS — R001 Bradycardia, unspecified: Principal | ICD-10-CM

## 2024-01-01 LAB — BASIC METABOLIC PANEL WITH GFR
Anion gap: 11 (ref 5–15)
BUN: 12 mg/dL (ref 8–23)
CO2: 21 mmol/L — ABNORMAL LOW (ref 22–32)
Calcium: 9.4 mg/dL (ref 8.9–10.3)
Chloride: 105 mmol/L (ref 98–111)
Creatinine, Ser: 0.95 mg/dL (ref 0.61–1.24)
GFR, Estimated: >60 mL/min
Glucose, Bld: 123 mg/dL — ABNORMAL HIGH (ref 70–99)
Potassium: 4.2 mmol/L (ref 3.5–5.1)
Sodium: 137 mmol/L (ref 135–145)

## 2024-01-01 LAB — CBC
HCT: 49.9 % (ref 39.0–52.0)
Hemoglobin: 17 g/dL (ref 13.0–17.0)
MCH: 27.9 pg (ref 26.0–34.0)
MCHC: 34.1 g/dL (ref 30.0–36.0)
MCV: 81.9 fL (ref 80.0–100.0)
Platelets: 273 10*3/uL (ref 150–400)
RBC: 6.09 MIL/uL — ABNORMAL HIGH (ref 4.22–5.81)
RDW: 14.6 % (ref 11.5–15.5)
WBC: 3.3 10*3/uL — ABNORMAL LOW (ref 4.0–10.5)
nRBC: 0 % (ref 0.0–0.2)

## 2024-01-01 LAB — TROPONIN I (HIGH SENSITIVITY)
Troponin I (High Sensitivity): 11 ng/L (ref <18)
Troponin I (High Sensitivity): 11 ng/L (ref <18)

## 2024-01-01 LAB — HIV ANTIBODY (ROUTINE TESTING W REFLEX): HIV Screen 4th Generation wRfx: NONREACTIVE

## 2024-01-01 LAB — APTT: aPTT: 39 s — ABNORMAL HIGH (ref 24–36)

## 2024-01-01 LAB — MAGNESIUM: Magnesium: 1.9 mg/dL (ref 1.7–2.4)

## 2024-01-01 LAB — HEPARIN LEVEL (UNFRACTIONATED): Heparin Unfractionated: >1.1 [IU]/mL — ABNORMAL HIGH (ref 0.30–0.70)

## 2024-01-01 MED ORDER — HEPARIN (PORCINE) 25000 UT/250ML-% IV SOLN
1200.0000 [IU]/h | INTRAVENOUS | Status: AC
  Administered 2024-01-01: 1300 [IU]/h via INTRAVENOUS
  Filled 2024-01-01: qty 250

## 2024-01-01 MED ORDER — AZELASTINE-FLUTICASONE 137-50 MCG/ACT NA SUSP
2.0000 | Freq: Every day | NASAL | Status: DC | PRN
Start: 2024-01-01 — End: 2024-01-01

## 2024-01-01 MED ORDER — EZETIMIBE 10 MG PO TABS
10.0000 mg | ORAL_TABLET | Freq: Every day | ORAL | Status: DC
  Administered 2024-01-02: 10 mg via ORAL
  Filled 2024-01-01: qty 1

## 2024-01-01 MED ORDER — ACETAMINOPHEN 325 MG PO TABS: 650.0000 mg | ORAL_TABLET | ORAL | Status: DC | PRN

## 2024-01-01 MED ORDER — HYDROCODONE-ACETAMINOPHEN 10-325 MG PO TABS: 1.0000 | ORAL_TABLET | Freq: Four times a day (QID) | ORAL | Status: DC | PRN

## 2024-01-01 MED ORDER — AMLODIPINE BESYLATE 10 MG PO TABS
10.0000 mg | ORAL_TABLET | Freq: Every day | ORAL | Status: DC
  Administered 2024-01-02: 10 mg via ORAL
  Filled 2024-01-01: qty 1

## 2024-01-01 MED ORDER — DUTASTERIDE 0.5 MG PO CAPS: 0.5000 mg | ORAL_CAPSULE | Freq: Every day | ORAL | Status: DC

## 2024-01-01 MED ORDER — PRAVASTATIN SODIUM 40 MG PO TABS
40.0000 mg | ORAL_TABLET | Freq: Every day | ORAL | Status: DC
  Administered 2024-01-01: 40 mg via ORAL
  Filled 2024-01-01: qty 1

## 2024-01-01 MED ORDER — NALOXONE HCL 4 MG/0.1ML NA LIQD: 1.0000 | NASAL | Status: DC | PRN

## 2024-01-01 MED ORDER — FLUTICASONE PROPIONATE 50 MCG/ACT NA SUSP: 2.0000 | Freq: Every day | NASAL | Status: DC | PRN

## 2024-01-01 MED ORDER — ONDANSETRON HCL 4 MG/2ML IJ SOLN: 4.0000 mg | Freq: Four times a day (QID) | INTRAMUSCULAR | Status: DC | PRN

## 2024-01-01 MED ORDER — PANTOPRAZOLE SODIUM 40 MG PO TBEC
40.0000 mg | DELAYED_RELEASE_TABLET | Freq: Every day | ORAL | Status: DC
  Administered 2024-01-02: 40 mg via ORAL
  Filled 2024-01-01: qty 1

## 2024-01-01 MED ORDER — AZELASTINE HCL 0.1 % NA SOLN: 2.0000 | Freq: Every day | NASAL | Status: DC | PRN

## 2024-01-01 NOTE — Telephone Encounter (Signed)
 STAT if HR is under 50 or over 120  (normal HR is 60-100 beats per minute)  What is your heart rate? 33 HR  Do you have a log of your heart rate readings (document readings)? 139/97 a little longer than 5 mins ago  Do you have any other symptoms? Puffy in face, he says he sick all over Transferred to Triage

## 2024-01-01 NOTE — ED Notes (Signed)
 CCMD called for cardiac monitoring.

## 2024-01-01 NOTE — ED Triage Notes (Signed)
 Pt sent by MD for further evaluated of afib, bradycardia; hr reportedly 30s-50s; denies dizziness/sob/lightheadedness; c/o back and hip pain, chronic; denies pain currently; pt gives verbal consent for mse

## 2024-01-01 NOTE — Progress Notes (Addendum)
 PHARMACY - ANTICOAGULATION CONSULT NOTE  Pharmacy Consult for Heparin  Indication: atrial fibrillation  Allergies  Allergen Reactions   Cortisone Rash    act different   Lipitor [Atorvastatin  Calcium ] Other (See Comments)    side pain    Patient Measurements:    Vital Signs: Temp: 97.7 F (36.5 C) (08/04 1539) Temp Source: Oral (08/04 1539) BP: 133/86 (08/04 1630) Pulse Rate: 39 (08/04 1630)  Labs: Recent Labs    01/01/24 1153  HGB 17.0  HCT 49.9  PLT 273  CREATININE 0.95    CrCl cannot be calculated (Unknown ideal weight.).   Medical History: Past Medical History:  Diagnosis Date   Anxiety    Arthritis    Avascular necrosis of bone (HCC)    left hip    Back pain, lumbosacral 2000   Blind right eye    Colon polyps 08/29/2011   Hyperplastic colon polyps   Mitral valve prolapse     Medications:  (Not in a hospital admission)  Scheduled:  Infusions:  PRN:   Assessment: Patient is a 68 YO M with PMH significant for persistent AF (on Eliquis , last dose 08/04 AM), slow ventricular rates, tobacco abuse, and Moderate to Severe MR. Pharmacy being consulted for heparin .  Given Eliquis  dose this morning, will start heparin  at 2000.  Goal of Therapy:  Heparin  level 0.3-0.7 units/ml aPTT 66-102 seconds Monitor platelets by anticoagulation protocol: Yes   Plan:  Start heparin  infusion at 1300 units/hr at 2000 Check anti-Xa level and aPTT in 6 hours and daily while on heparin  Continue to monitor H&H and platelets  R. Samual Satterfield, PharmD PGY-1 Acute Care Pharmacy Resident [facility] 01/01/2024 4:40 PM

## 2024-01-01 NOTE — H&P (Signed)
 ELECTROPHYSIOLOGY CONSULT NOTE    Patient ID: Jonathan [name] MRN: [id number], DOB/AGE: Sep 17, 1955 68 y.o.  Admit date: 01/01/2024 Date of Consult: 01/01/2024  Primary Physician: Maree Leni Edyth DELENA, MD Primary Cardiologist: Lynwood Schilling, MD  Electrophysiologist: New   Referring Provider: Dr. Francesca  Patient Profile: Jonathan [name] is a 68 y.o. male with a history of persistent AF, slow ventricular rates, tobacco abuse, and Mod/Severe MR who is being seen today for the evaluation of symptomatic bradycardia at the request of Dr. Francesca.  HPI:  Jonathan [name] is a 68 y.o. male with medical history as above.   Dr. Schilling has been trying for some time to get him to agree to evaluation for his valve.   This morning he woke up feeling quite poorly. 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  Called the office and was instructed to present to ED for evaluation.   Here, he is in slow fib with HRs dipping down into the 30s while awake. States his HR does usually come up with activity, has been very active even up to today.  Denies syncope or chest pain. No undue SOB currently, just fatigue. Wife states he has been more fatigued as of late, and is one to minimize symptoms. He prefers to avoid invasive procedures, but understands valve is likely contributing to AF, and pacemaker consideration may increase some of his treatment options.   Labs Potassium4.2 (08/04 1153)   Creatinine, ser  0.95 (08/04 1153) PLT  273 (08/04 1153) HGB  17.0 (08/04 1153) WBC 3.3* (08/04 1153)  .    Past Medical History:  Diagnosis Date   Anxiety    Arthritis    Avascular necrosis of bone (HCC)    left hip    Back pain, lumbosacral 2000   Blind right eye    Colon polyps 08/29/2011   Hyperplastic colon polyps   Mitral valve prolapse      Surgical History:  Past Surgical History:  Procedure Laterality Date   BUBBLE STUDY  11/25/2020   Procedure: BUBBLE  STUDY;  Surgeon: Barbaraann Darryle Ned, MD;  Location: [facility] ENDOSCOPY;  Service: Cardiovascular;;   EYE SURGERY  2007   Fractured bones repaired/blind in right eye   HERNIA REPAIR     Right inguinal   SPINE SURGERY     TEE WITHOUT CARDIOVERSION N/A 11/25/2020   Procedure: TRANSESOPHAGEAL ECHOCARDIOGRAM (TEE);  Surgeon: Barbaraann Darryle Ned, MD;  Location: [facility] ENDOSCOPY;  Service: Cardiovascular;  Laterality: N/A;   TEE WITHOUT CARDIOVERSION N/A 08/10/2022   Procedure: TRANSESOPHAGEAL ECHOCARDIOGRAM (TEE);  Surgeon: Francyne Headland, MD;  Location: [facility] ENDOSCOPY;  Service: Cardiovascular;  Laterality: N/A;   TOTAL HIP ARTHROPLASTY Right 09/18/2014   Procedure: RIGHT TOTAL HIP ARTHROPLASTY ANTERIOR APPROACH;  Surgeon: Redell Shoals, MD;  Location: [facility] ORS;  Service: Orthopedics;  Laterality: Right;   TOTAL HIP ARTHROPLASTY Left 04/06/2017   Procedure: LEFT TOTAL HIP ARTHROPLASTY ANTERIOR APPROACH;  Surgeon: Shoals Redell, MD;  Location: [facility] ORS;  Service: Orthopedics;  Laterality: Left;  Needs RNFA     (Not in a hospital admission)   Inpatient Medications:   Allergies:  Allergies  Allergen Reactions   Cortisone Rash    act different   Lipitor [Atorvastatin  Calcium ] Other (See Comments)    side pain    Family History  Problem Relation Age of Onset   Stroke Mother    Arthritis Mother    Stroke Father  Hypertension Father    Cancer Neg Hx    COPD Neg Hx    Heart disease Neg Hx    Kidney disease Neg Hx      Physical Exam: Vitals:   01/01/24 1136 01/01/24 1303  BP: (!) 140/94 133/89  Pulse: (!) 52 (!) 36  Resp: 16 18  Temp: 98 F (36.7 C)   SpO2: 100% 97%    GEN- NAD, A&O x 3, normal affect HEENT: Normocephalic, atraumatic Lungs- CTAB, Normal effort.  Heart- Slow and irregularly irregularly rate and rhythm, No M/G/R.  GI- Soft, NT, ND.  Extremities- No clubbing, cyanosis, or edema   Radiology/Studies: No results found.  EKG: on arrival shows AF with slow VR  (personally reviewed)  TELEMETRY: AF with slow VR 30-60s (personally reviewed)  Assessment/Plan:  AF with slow VR Symptomatic bradycardia Not on AV nodal agents.  ? If cardioversion would convert to NSR with improve rates, but can't use AAD With baseline bradycardia and unlikely to maintain rhythm with severe MR.  Not currently ablation candidate with severe MR.  Likely recommendation will be to proceed with permanent pacing in the very near future and then proceed with valve work up.   Severe MR EF normal with severe LAE by echo 08/25/2023 Severe MR noted with moderate/late systolic prolapse of the middle scallop of the posterior leaflet of the mitral valve  Tobacco abuse Will need ischemic eval for valve consideration -> defer unless Trop significant.  Will admit to obs and discuss timing of pacing pending further symptoms and MD evaluation.   For questions or updates, please contact [facility] Please consult [email or web address] for contact info under     Signed, Ozell Prentice Passey, PA-C  01/01/2024, 1:23 PM

## 2024-01-01 NOTE — Plan of Care (Signed)
  Problem: Education: Goal: Knowledge of General Education information will improve Description: Including pain rating scale, medication(s)/side effects and non-pharmacologic comfort measures Outcome: Progressing   Problem: Health Behavior/Discharge Planning: Goal: Ability to manage health-related needs will improve Outcome: Progressing   Problem: Clinical Measurements: Goal: Ability to maintain clinical measurements within normal limits will improve Outcome: Progressing Goal: Will remain free from infection Outcome: Progressing Goal: Diagnostic test results will improve Outcome: Progressing Goal: Respiratory complications will improve Outcome: Progressing Goal: Cardiovascular complication will be avoided Outcome: Progressing   Problem: Activity: Goal: Risk for activity intolerance will decrease Outcome: Progressing   Problem: Nutrition: Goal: Adequate nutrition will be maintained Outcome: Progressing   Problem: Coping: Goal: Level of anxiety will decrease Outcome: Progressing   Problem: Elimination: Goal: Will not experience complications related to bowel motility Outcome: Progressing Goal: Will not experience complications related to urinary retention Outcome: Progressing   Problem: Pain Managment: Goal: General experience of comfort will improve and/or be controlled Outcome: Progressing   Problem: Safety: Goal: Ability to remain free from injury will improve Outcome: Progressing   Problem: Activity: Goal: Ability to tolerate increased activity will improve Outcome: Progressing   Problem: Cardiac: Goal: Ability to achieve and maintain adequate cardiopulmonary perfusion will improve Outcome: Progressing   Problem: Health Behavior/Discharge Planning: Goal: Ability to safely manage health-related needs after discharge will improve Outcome: Progressing

## 2024-01-01 NOTE — ED Provider Notes (Signed)
 [place] EMERGENCY DEPARTMENT AT [facility] Provider Note   CSN: [id number] Arrival date & time: 01/01/24  1131     Patient presents with: No chief complaint on file.   Jonathan [name] is a 68 y.o. male.   68 year old male presenting with bradycardia.  Patient with history of A-fib on Eliquis , his partner tells me that he woke up this morning and states he did not feel well, she checked his pulse with her blood pressure cuff and it was found to be in the 30s to 40s, they reached out to his cardiologist Dr. Lavona who instructed them to come to the emergency department.  Patient reports that he is feeling well, denies lightheadedness/dizziness, syncope/loss of consciousness, chest pain, shortness of breath. History of severe MR, has been recommended in the past that he proceed with valve replacement but he is very hesitant to do so.        Prior to Admission medications   Medication Sig Start Date End Date Taking? Authorizing Provider  amLODipine  (NORVASC ) 10 MG tablet Take 10 mg by mouth daily. 09/29/22   [provider]  apixaban  (ELIQUIS ) 5 MG TABS tablet Take 1 tablet (5 mg total) by mouth 2 (two) times daily. 03/09/23   Lavona Agent, MD  Azelastine -Fluticasone  137-50 MCG/ACT SUSP INSTILL 2 SPRAYS IN EACH NOSTRIL TWICE A DAY Patient taking differently: Place 2 sprays into both nostrils daily as needed (allergies.). 01/18/21   Joshua Debby CROME, MD  dexlansoprazole  (DEXILANT ) 60 MG capsule TAKE 1 CAPSULE (60 MG TOTAL) BY MOUTH DAILY. **PLEASE CALL OFFICE TO SCHEDULE FOLLOW UP 03/30/23   Beather Delon Gibson, PA  dutasteride  (AVODART ) 0.5 MG capsule Take 1 capsule (0.5 mg total) by mouth daily. 01/28/21   Joshua Debby CROME, MD  ezetimibe  (ZETIA ) 10 MG tablet Take 10 mg by mouth daily. 09/30/22   [provider]  HYDROcodone -acetaminophen  (NORCO) 10-325 MG tablet Take 1 tablet by mouth every 6 (six) hours as needed for moderate pain. 11/16/21   Joshua Debby CROME, MD  naloxone  (NARCAN ) nasal spray 4 mg/0.1 mL Place 1 spray into the nose as needed (opioid overdose). 08/16/21   Joshua Debby CROME, MD  pravastatin  (PRAVACHOL ) 40 MG tablet TAKE 1 TABLET BY MOUTH EVERY DAY AT BEDTIME FOR CHOLESTEROL 01/30/23   [provider]    Allergies: Cortisone and Lipitor [atorvastatin  calcium ]    Review of Systems  Updated Vital Signs  Vitals:   01/01/24 1303 01/01/24 1315 01/01/24 1325 01/01/24 1539  BP: 133/89 (!) 137/90    Pulse: (!) 36 (!) 34 (!) 57   Resp: 18 18 11    Temp:    97.7 F (36.5 C)  TempSrc:    Oral  SpO2: 97% 100% 98%      Physical Exam Vitals and nursing note reviewed.  HENT:     Head: Normocephalic.  Eyes:     Extraocular Movements: Extraocular movements intact.  Cardiovascular:     Rate and Rhythm: Bradycardia present. Rhythm irregular.     Heart sounds: Murmur (holosystolic) heard.  Pulmonary:     Effort: Pulmonary effort is normal.     Breath sounds: Normal breath sounds.  Musculoskeletal:     Cervical back: Normal range of motion.     Comments: Moves all extremities spontaneously without difficulty  Skin:    General: Skin is warm and dry.  Neurological:     Mental Status: He is alert and oriented to person, place, and time.     (  all labs ordered are listed, but only abnormal results are displayed) Labs Reviewed  CBC - Abnormal; Notable for the following components:      Result Value   WBC 3.3 (*)    RBC 6.09 (*)    All other components within normal limits  BASIC METABOLIC PANEL WITH GFR - Abnormal; Notable for the following components:   CO2 21 (*)    Glucose, Bld 123 (*)    All other components within normal limits  MAGNESIUM  TROPONIN I (HIGH SENSITIVITY)    EKG: None  Radiology: No results found.   Procedures   Medications Ordered in the ED - No data to display                                  Medical Decision Making This patient presents to the ED for concern of bradycardia,  this involves an extensive number of treatment options, and is a complaint that carries with it a high risk of complications and morbidity.  The differential diagnosis includes slow afib, AV block, other arrhythmia, medication side effect,    Co morbidities that complicate the patient evaluation  Slow A-fib, hyperlipidemia, heart failure   Additional history obtained:  Additional history obtained from record review External records from outside source obtained and reviewed including prior cardiology note   Lab Tests:  I Ordered, and personally interpreted labs.  The pertinent results include: CBC largely stable as compared to previous from 7 months ago.  BMP unremarkable.  Magnesium within normal limits.   Cardiac Monitoring: / EKG:  The patient was maintained on a cardiac monitor.  I personally viewed and interpreted the cardiac monitored which showed an underlying rhythm of: afib/bradycardia   Consultations Obtained:  I requested consultation with the cardiology service,  and discussed lab and imaging findings as well as pertinent plan - they recommend: See PA-C Ozell Passey note, the cardiology service will be admitting the patient for observation with a plan to discuss pacemaker placement in the near future   Problem List / ED Course / Critical interventions / Medication management I have reviewed the patients home medicines and have made adjustments as needed   Social Determinants of Health:  Former tobacco use   Test / Admission - Considered:  Physical exam is notable as above.  Patient found to be in A-fib with bradycardia, rates as low as the 30s during time of my assessment.  He is not in acute distress, denies lightheadedness/dizziness/near syncope, feels well but his wife tells me that he minimizes his symptoms at times.  Not on beta-blockers or any other medications that may be contributing to his slowed rhythm.  Will consult cardiology service further guidance,  see above.  Patient is established with Dr. Lavona, history of mitral valve regurgitation with plan for valve replacement at some point, patient has been hesitant to schedule this.     Amount and/or Complexity of Data Reviewed Labs: ordered.  Risk Decision regarding hospitalization.        Final diagnoses:  Bradycardia  Atrial fibrillation, unspecified type [facility])    ED Discharge Orders     None          Glendia Rocky LOISE DEVONNA 01/01/24 1627    Francesca Elsie CROME, MD 01/02/24 [phone]

## 2024-01-01 NOTE — ED Notes (Signed)
 Spoke with [place] and notified patient coming upstairs. n

## 2024-01-01 NOTE — Telephone Encounter (Signed)
 Jonathan [name] (dpr) with pt on speaker phone- reports that his hr is going from 33 and skipping several beats, then went up to 59, then goes back down to the 30's. The patient states that he just feels sick all over. Reports that when he lays down his heartrate decreases again. Instucted to remain sitting up Asked if he is dizzy or short of breath- He said that he is not.   She asked for an appointment today- Informed that the patient needs to go to the Emergency Room. If patient is unable to walk to vehicle/feels like passing out- then to call 911.   She verbalized understanding. Informed that I will send this information to his provider. They can call after ER visit for an update if wanted.   She said she will take him to [facility] ER.

## 2024-01-02 DIAGNOSIS — I4891 Unspecified atrial fibrillation: Secondary | ICD-10-CM | POA: Diagnosis not present

## 2024-01-02 LAB — CBC
HCT: 49.4 % (ref 39.0–52.0)
Hemoglobin: 17.2 g/dL — ABNORMAL HIGH (ref 13.0–17.0)
MCH: 28.1 pg (ref 26.0–34.0)
MCHC: 34.8 g/dL (ref 30.0–36.0)
MCV: 80.7 fL (ref 80.0–100.0)
Platelets: 266 K/uL (ref 150–400)
RBC: 6.12 MIL/uL — ABNORMAL HIGH (ref 4.22–5.81)
RDW: 14.3 % (ref 11.5–15.5)
WBC: 3.8 K/uL — ABNORMAL LOW (ref 4.0–10.5)
nRBC: 0 % (ref 0.0–0.2)

## 2024-01-02 LAB — APTT: aPTT: 128 s — ABNORMAL HIGH (ref 24–36)

## 2024-01-02 LAB — BASIC METABOLIC PANEL WITH GFR
Anion gap: 10 (ref 5–15)
BUN: 11 mg/dL (ref 8–23)
CO2: 22 mmol/L (ref 22–32)
Calcium: 9.1 mg/dL (ref 8.9–10.3)
Chloride: 103 mmol/L (ref 98–111)
Creatinine, Ser: 0.93 mg/dL (ref 0.61–1.24)
GFR, Estimated: >60 mL/min (ref >60)
Glucose, Bld: 90 mg/dL (ref 70–99)
Potassium: 3.8 mmol/L (ref 3.5–5.1)
Sodium: 135 mmol/L (ref 135–145)

## 2024-01-02 LAB — HEPARIN LEVEL (UNFRACTIONATED): Heparin Unfractionated: 0.97 [IU]/mL — ABNORMAL HIGH (ref 0.30–0.70)

## 2024-01-02 MED ORDER — DUTASTERIDE 0.5 MG PO CAPS: 0.5000 mg | ORAL_CAPSULE | Freq: Every day | ORAL | Status: AC

## 2024-01-02 NOTE — Progress Notes (Signed)
  HRs have been stable overnight and chronically low.  Pt states other than yesterday he has been working out in the yard without any issues.   If ambulates without symptoms and stable HR, will plan close outpatient follow up with device planning.   Dr. Nancey and Dr. Cindie have both examined and in agreement, that pacing is not urgent, and would prefer to perform electively with longer eliquis  hold.  Ozell Jodie Passey, PA-C  01/02/2024 7:28 AM

## 2024-01-02 NOTE — TOC CM/SW Note (Signed)
 Transition of Care [facility]) - Inpatient Brief Assessment   Patient Details  Name: Jonathan [name] MRN: [id number] Date of Birth: [birth year]  Transition of Care [facility]) CM/SW Contact:    Sudie Erminio Deems, RN Phone Number: 01/02/2024, 10:12 AM   Clinical Narrative: Patient presented for evaluation of symptomatic bradycardia. PTA patient was independent from home alone; however, has support of ex wife Lamarr. Patient has PCP at [facility] and ex wife drives him to appointments. No home health needs identified at this time. Patient has transportation home.    Transition of Care Asessment: Insurance and Status: Insurance coverage has been reviewed Patient has primary care physician: Yes [facility]) Home environment has been reviewed: reviewed Prior level of function:: independent Prior/Current Home Services: No current home services Social Drivers of Health Review: SDOH reviewed no interventions necessary Readmission risk has been reviewed: Yes Transition of care needs: no transition of care needs at this time

## 2024-01-02 NOTE — Plan of Care (Signed)

## 2024-01-02 NOTE — Care Management Obs Status (Signed)
 MEDICARE OBSERVATION STATUS NOTIFICATION   Patient Details  Name: Jonathan [name] MRN: [id number] Date of Birth: [birth year]   Medicare Observation Status Notification Given:  Yes    Vonzell Arrie Sharps 01/02/2024, 11:15 AM

## 2024-01-02 NOTE — Progress Notes (Signed)
 PHARMACY - ANTICOAGULATION CONSULT NOTE  Pharmacy Consult for heparin  Indication: atrial fibrillation  Labs: Recent Labs    01/01/24 1153 01/01/24 1507 01/01/24 1854 01/02/24 0243  HGB 17.0  --   --  17.2*  HCT 49.9  --   --  49.4  PLT 273  --   --  266  APTT  --   --  39* 128*  HEPARINUNFRC  --   --  >1.10* 0.97*  CREATININE 0.95  --   --  0.93  TROPONINIHS  --  11 11  --    Assessment: 68yo male supratherapeutic on heparin  with initial dosing while DOAC on hold; no infusion issues or signs of bleeding per RN.  Goal of Therapy:  aPTT 66-102 seconds   Plan:  Decrease heparin  infusion by 1 unit/kg/hr to 1200 units/hr until off at 6a.   Marvetta Dauphin, PharmD, BCPS 01/02/2024 3:15 AM

## 2024-01-02 NOTE — Discharge Summary (Signed)
 ELECTROPHYSIOLOGY DISCHARGE SUMMARY    Patient ID: Jonathan [name],  MRN: [id number], DOB/AGE: [birth year] 70 y.o.  Admit date: 01/01/2024 Discharge date: 01/02/2024  Primary Care Physician: Maree Leni Edyth DELENA, MD  Primary Cardiologist: Lynwood Schilling, MD  Electrophysiologist: New to Dr. Nancey   Primary Discharge Diagnosis:  Atrial fibrillation with slow VR  Secondary Discharge Diagnosis:  Severe MR    Brief HPI: Jonathan [name] is a 68 y.o. male with a history of severe MR, longstanding bradycardia, HTN, tobacco abuse, and HLD admitted for fatigue and reported bradycardia.   Hospital Course:  The patient was admitted with reports of fatigue, and incidentally found to have intermittent HRs into the 30s, which has been somewhat of a chronic issue.  They were monitored on telemetry overnight which demonstrated AF with HRs 40-70s, that increased with activity. The patient was examined and considered to be stable for discharge with close follow up.   Trajectory discussed at length and with two different MDs. He is unlikely to maintain NSR without managing his Mitral Valve. He had been putting off surgical consideration but now is willing to proceed.  Have made follow up with Dr. Schilling to help arrange and plan on timing.   Options including pacing and then proceeding with MVR work up afterwards (ideally with at least 6 weeks of healing time after pacer) OR MVR work up and planning, with likely PPM during MVR admission if bradycardia persists.   Physical Exam: Vitals:   01/01/24 2200 01/01/24 2313 01/02/24 0322 01/02/24 0741  BP:  128/82 97/75 (!) 124/92  Pulse: (!) 52 (!) 52 (!) 28 61  Resp:  18 18 17   Temp:  98.5 F (36.9 C) 97.8 F (36.6 C) 98.1 F (36.7 C)  TempSrc:   Oral Oral  SpO2: 97% 98% 98% 99%  Weight:      Height:        GEN- NAD. A&O x 3.  HEENT: Normocephalic, atraumatic Lungs- CTAB, Normal effort.  Heart- RRR, No M/G/R.  GI- Soft, NT, ND.   Extremities- No clubbing, cyanosis, or edema; Groin without hematoma   Discharge Medications:  Allergies as of 01/02/2024       Reactions   Pravastatin  Other (See Comments)   Myalgias    Zetia  [ezetimibe ] Other (See Comments)   Myalgias    Cortisone Rash   act different   Lipitor [atorvastatin  Calcium ] Other (See Comments)   Myalgias         Medication List     TAKE these medications    amLODipine  10 MG tablet Commonly known as: NORVASC  Take 10 mg by mouth daily.   apixaban  5 MG Tabs tablet Commonly known as: ELIQUIS  Take 1 tablet (5 mg total) by mouth 2 (two) times daily. What changed: when to take this   dexlansoprazole  60 MG capsule Commonly known as: DEXILANT  TAKE 1 CAPSULE (60 MG TOTAL) BY MOUTH DAILY. **PLEASE CALL OFFICE TO SCHEDULE FOLLOW UP   dutasteride  0.5 MG capsule Commonly known as: AVODART  Take 0.5 mg by mouth daily.   dutasteride  0.5 MG capsule Commonly known as: AVODART  Take 1 capsule (0.5 mg total) by mouth daily.   ezetimibe  10 MG tablet Commonly known as: ZETIA  Take 10 mg by mouth daily.   HYDROcodone -acetaminophen  10-325 MG tablet Commonly known as: NORCO Take 1 tablet by mouth every 6 (six) hours as needed for moderate pain.   ipratropium 0.06 % nasal spray Commonly known as: ATROVENT Place 1-2 sprays into  both nostrils daily as needed for rhinitis (allergies).   levocetirizine 5 MG tablet Commonly known as: XYZAL  Take 5 mg by mouth daily as needed for allergies.   montelukast 10 MG tablet Commonly known as: SINGULAIR Take 10 mg by mouth daily as needed (allergies).   naloxone  4 MG/0.1ML Liqd nasal spray kit Commonly known as: NARCAN  Place 1 spray into the nose as needed (opioid overdose).   pravastatin  40 MG tablet Commonly known as: PRAVACHOL  Take 40 mg by mouth at bedtime.   Vitamin D3 1000 units Caps Take 1,000 Units by mouth daily.        Disposition: Home with usual follow up as in AVS  Duration of  Discharge Encounter:  APP time: 22 minutes  Signed, Ozell Prentice Passey, PA-C  01/02/2024 9:49 AM

## 2024-01-03 NOTE — Progress Notes (Unsigned)
 Cardiology Office Note:   Date:  01/05/2024  ID:  Jonathan [name], DOB 1956-03-06, MRN [id number] PCP: Jonathan Leni Edyth DELENA, [name]  [facility] Providers Cardiologist:  Jonathan Schilling, [name] {  History of Present Illness:   Jonathan [name] is a 68 y.o. male who I saw back in  2013. He was seen by Jonathan [name] in 2020 for evaluation of chronic diastolic HF.    He also has bradycardia.  He has had some chronic diastolic dysfunction.  In June 2022 I sent him for an echo with a myxomatous mitral valve prolapse of both leaflets.  The MR was at least moderate.  TEE confirmed this to be moderate and suggested continued follow-up.  ERO is 0.25 cm2, Regurgitant volume was 39 cc.  On follow up TTE this June he had moderate to severe MR.  LA size was 37 mm with an EF of 60 - 65%.    I sent him for a TEE.  This demonstrated moderate to severe MR.    He was seen by Dr. Maryjane in June 2024 and the decision was to follow him clinically.  He has since developed atrial fib.   He had follow up echo in March and had severe MR.    He returns for follow up.  He was recently hospitalized for bradycardia but he did not have an indication for pacing.  He was seen by EP.  They have he presented he was sluggish and his family noted his heart rate to be in the 20s to 30s but he had no symptomatic pauses when he was in the hospital and again was seen by EP.  He says he is felt well since I saw him.  He does not notice his fibrillation.  He does not have any chest pressure, neck or arm discomfort.  He does not have any shortness of breath, PND or orthopnea.  Tries to stay active but his family has kind of restricted him recently.   ROS: As stated in the HPI and negative for all other systems.  Studies Reviewed:    EKG:      NA  Risk Assessment/Calculations:    CHA2DS2-VASc Score = 2   This indicates a 2.2% annual risk of stroke. The patient's score is based upon:   Physical Exam:   VS:  BP 132/82  (BP Location: Right Arm, Patient Position: Sitting, Cuff Size: Normal)   Pulse 60   Ht 6' 2 (1.88 m)   Wt 210 lb 4.8 oz (95.4 kg)   SpO2 98%   BMI 27.00 kg/m    Wt Readings from Last 3 Encounters:  01/04/24 210 lb 4.8 oz (95.4 kg)  01/01/24 204 lb 3.2 oz (92.6 kg)  11/08/23 204 lb (92.5 kg)     GEN: Well nourished, well developed in no acute distress NECK: No JVD; No carotid bruits CARDIAC: Irregular RR, 3/6 apical holosystolic murmur, no diastolic murmurs, rubs, gallops RESPIRATORY:  Clear to auscultation without rales, wheezing or rhonchi  ABDOMEN: Soft, non-tender, non-distended EXTREMITIES:  No edema; No deformity   ASSESSMENT AND PLAN:   MR:  This is severe.  Since I referred him previously to the TCTS he developed atrial fibrillation.  He is really wanted to hold off on surgery but now would consent to this.  Given this I am going to update his TEE and get a right and left heart cath and make a referral to Dr. Chyrl [name] at [facility] for  mitral valve surgery.  Bradycardia:   Currently does not have an indication for pacing he has no symptomatic bradycardia arrhythmias.  However, he may well need pacing at the time of surgery but that can be determined at [facility].   Essential hypertension: His blood pressure is at target.  No change in therapy.  Atrial fib: He is tolerating anticoagulation.  He will hold his anticoagulation as per protocol prior to to the cath and then resume on the day of the cath.   Dyslipidemia: He has been tolerating pravastatin  and Zetia  for dyslipidemia.  Goals of therapy will be based on the findings of cath.     Follow up with me after his appointment is due. Mitral valve surgery  Signed, Jonathan Schilling, [name]

## 2024-01-03 NOTE — H&P (View-Only) (Signed)
 Cardiology Office Note:   Date:  01/05/2024  ID:  Jonathan [name], DOB 11-14-1955, MRN [id number] PCP: Maree Leni Edyth DELENA, MD  [facility] Providers Cardiologist:  Lynwood Schilling, MD {  History of Present Illness:   Jonathan [name] is a 68 y.o. male who I saw back in  2013. He was seen by [name] NP in 2020 for evaluation of chronic diastolic HF.    He also has bradycardia.  He has had some chronic diastolic dysfunction.  In June 2022 I sent him for an echo with a myxomatous mitral valve prolapse of both leaflets.  The MR was at least moderate.  TEE confirmed this to be moderate and suggested continued follow-up.  ERO is 0.25 cm2, Regurgitant volume was 39 cc.  On follow up TTE this June he had moderate to severe MR.  LA size was 37 mm with an EF of 60 - 65%.    I sent him for a TEE.  This demonstrated moderate to severe MR.    He was seen by Dr. Maryjane in June 2024 and the decision was to follow him clinically.  He has since developed atrial fib.   He had follow up echo in March and had severe MR.    He returns for follow up.  He was recently hospitalized for bradycardia but he did not have an indication for pacing.  He was seen by EP.  They have he presented he was sluggish and his family noted his heart rate to be in the 20s to 30s but he had no symptomatic pauses when he was in the hospital and again was seen by EP.  He says he is felt well since I saw him.  He does not notice his fibrillation.  He does not have any chest pressure, neck or arm discomfort.  He does not have any shortness of breath, PND or orthopnea.  Tries to stay active but his family has kind of restricted him recently.   ROS: As stated in the HPI and negative for all other systems.  Studies Reviewed:    EKG:      NA  Risk Assessment/Calculations:    CHA2DS2-VASc Score = 2   This indicates a 2.2% annual risk of stroke. The patient's score is based upon:   Physical Exam:   VS:  BP 132/82  (BP Location: Right Arm, Patient Position: Sitting, Cuff Size: Normal)   Pulse 60   Ht 6' 2 (1.88 m)   Wt 210 lb 4.8 oz (95.4 kg)   SpO2 98%   BMI 27.00 kg/m    Wt Readings from Last 3 Encounters:  01/04/24 210 lb 4.8 oz (95.4 kg)  01/01/24 204 lb 3.2 oz (92.6 kg)  11/08/23 204 lb (92.5 kg)     GEN: Well nourished, well developed in no acute distress NECK: No JVD; No carotid bruits CARDIAC: Irregular RR, 3/6 apical holosystolic murmur, no diastolic murmurs, rubs, gallops RESPIRATORY:  Clear to auscultation without rales, wheezing or rhonchi  ABDOMEN: Soft, non-tender, non-distended EXTREMITIES:  No edema; No deformity   ASSESSMENT AND PLAN:   MR:  This is severe.  Since I referred him previously to the TCTS he developed atrial fibrillation.  He is really wanted to hold off on surgery but now would consent to this.  Given this I am going to update his TEE and get a right and left heart cath and make a referral to Dr. Chyrl Fruits at [facility] for  mitral valve surgery.  Bradycardia:   Currently does not have an indication for pacing he has no symptomatic bradycardia arrhythmias.  However, he may well need pacing at the time of surgery but that can be determined at [facility].   Essential hypertension: His blood pressure is at target.  No change in therapy.  Atrial fib: He is tolerating anticoagulation.  He will hold his anticoagulation as per protocol prior to to the cath and then resume on the day of the cath.   Dyslipidemia: He has been tolerating pravastatin  and Zetia  for dyslipidemia.  Goals of therapy will be based on the findings of cath.     Follow up with me after his appointment is due. Mitral valve surgery  Signed, Lynwood Schilling, MD

## 2024-01-04 ENCOUNTER — Encounter: Payer: Self-pay | Admitting: Cardiology

## 2024-01-04 ENCOUNTER — Telehealth: Payer: Self-pay | Admitting: *Deleted

## 2024-01-04 ENCOUNTER — Ambulatory Visit: Attending: Cardiology | Admitting: Cardiology

## 2024-01-04 VITALS — BP 132/82 | HR 60 | Ht 74.0 in | Wt 210.3 lb

## 2024-01-04 DIAGNOSIS — Z720 Tobacco use: Secondary | ICD-10-CM

## 2024-01-04 DIAGNOSIS — I10 Essential (primary) hypertension: Secondary | ICD-10-CM

## 2024-01-04 DIAGNOSIS — I4819 Other persistent atrial fibrillation: Secondary | ICD-10-CM

## 2024-01-04 DIAGNOSIS — I340 Nonrheumatic mitral (valve) insufficiency: Secondary | ICD-10-CM

## 2024-01-04 NOTE — Telephone Encounter (Addendum)
 Cardiac Catheterization scheduled at [facility] for: Friday January 12, 2024 10:30 AM/TEE 9 AM  Arrival time [facility] Main Entrance A at: 8 AM  Nothing to eat or drink after midnight.  Medication instructions: -Hold:  Eliquis -none 01/10/24 until post procedure -Other usual morning medications can be taken with sips of water  including aspirin  81 mg.  Plan to go home the same day, you will only stay overnight if medically necessary.  You must have responsible adult to drive you home.  Someone must be with you the first 24 hours after you arrive home.  Reviewed procedure instructions with patient's wife at patient's request. Procedure times were changed to allow TEE first and cath to follow-pt's wife aware.

## 2024-01-04 NOTE — Patient Instructions (Signed)
 Medication Instructions:  Your physician recommends that you continue on your current medications as directed. Please refer to the Current Medication list given to you today.  *If you need a refill on your cardiac medications before your next appointment, please call your pharmacy*  Lab Work: NONE If you have labs (blood work) drawn today and your tests are completely normal, you will receive your results only by: MyChart Message (if you have MyChart) OR A paper copy in the mail If you have any lab test that is abnormal or we need to change your treatment, we will call you to review the results.  Testing/Procedures: Right and Left Heart Cath TEE  Follow-Up: At [facility], you and your health needs are our priority.  As part of our continuing mission to provide you with exceptional heart care, our providers are all part of one team.  This team includes your primary Cardiologist (physician) and Advanced Practice Providers or APPs (Physician Assistants and Nurse Practitioners) who all work together to provide you with the care you need, when you need it.  Your next appointment:   6 month(s)  Provider:   Lynwood Schilling, MD    We recommend signing up for the patient portal called MyChart.  Sign up information is provided on this After Visit Summary.  MyChart is used to connect with patients for Virtual Visits (Telemedicine).  Patients are able to view lab/test results, encounter notes, upcoming appointments, etc.  Non-urgent messages can be sent to your provider as well.   To learn more about what you can do with MyChart, go to [email or web address].   Other Instructions  You have been referred to Dr. Juliane Fruits at [facility] A DEPT OF [facility] AT [address] A DEPT OF THE . [facility] [address] [place] [address] Dept: [phone] Loc: [phone]  Jarryn L Inabinet [name]  01/04/2024  You are  scheduled for a Cardiac Catheterization on Friday, August 15 with Dr. Gordy Bergamo.  1. Please arrive at the [facility] (Main Entrance A) at [facility]: [address] [place], [place] [address] at 7:00 AM (This time is 2 hour(s) before your procedure to ensure your preparation).   Free valet parking service is available. You will check in at ADMITTING. The support person will be asked to wait in the waiting room.  It is OK to have someone drop you off and come back when you are ready to be discharged.    Special note: Every effort is made to have your procedure done on time. Please understand that emergencies sometimes delay scheduled procedures.  2. Diet: NPO: Nothing to eat OR drink after midnight. (For TEE and Cath the same day)   3. Hydration: You need to be well hydrated before your procedure time. You may drink approved liquids (see below) until you arrive at the hospital. On the way to the hospital, please drink a 16-oz (1 plastic bottle) of water .   List of approved liquids water , clear juice, clear tea, black coffee, fruit juices, non-citric and without pulp, carbonated beverages, Gatorade, Kool -Aid, plain Jello-O and plain ice popsicles.   4. Labs: Done  5. Medication instructions in preparation for your procedure:   Contrast Allergy: No   Stop taking Eliquis  (Apixiban) on Tuesday, August 12.   On the morning of your procedure, take your Aspirin  81 mg and any morning medicines NOT listed above.  You may use  sips of water .  6. Plan to go home the same day, you will only stay overnight if medically necessary. 7. Bring a current list of your medications and current insurance cards. 8. You MUST have a responsible person to drive you home. 9. Someone MUST be with you the first 24 hours after you arrive home or your discharge will be delayed. 10. Please wear clothes that are easy to get on and off and wear slip-on shoes.  Thank you for allowing us  to care for you!   --  [facility]       Dear Jonathan [name]  You are scheduled for a TEE (Transesophageal Echocardiogram) on Friday, August 15 with Dr. Kate.  Please arrive at the [facility] (Main Entrance A) at [facility]: [address] [place], [place] [address] at 10:00 AM (This time is 1 hour(s) before your procedure to ensure your preparation).   Free valet parking service is available. You will check in at ADMITTING.   *Please Note: You will receive a call the day before your procedure to confirm the appointment time. That time may have changed from the original time based on the schedule for that day.*   DIET:  Nothing to eat or drink after midnight except a sip of water  with medications (see medication instructions below)  MEDICATION INSTRUCTIONS: !!IF ANY NEW MEDICATIONS ARE STARTED AFTER TODAY, PLEASE NOTIFY YOUR PROVIDER AS SOON AS POSSIBLE!!  FYI: Medications such as Semaglutide (Ozempic, [place]), Tirzepatide (Mounjaro, Zepbound), Dulaglutide (Trulicity), etc (GLP1 agonists) AND Canagliflozin (Invokana), Dapagliflozin (Farxiga), Empagliflozin (Jardiance), Ertugliflozin (Steglatro), Bexagliflozin [employer]) or any combination with one of these drugs such as Invokamet (Canagliflozin/Metformin), Synjardy (Empagliflozin/Metformin), etc (SGLT2 inhibitors) must be held around the time of a procedure. This is not a comprehensive list of all of these drugs. Please review all of your medications and talk to your provider if you take any one of these. If you are not sure, ask your provider.    **Take you last dose of Eliquis  on Tuesday 8/12   LABS: Done  FYI:  For your safety, and to allow us  to monitor your vital signs accurately during the surgery/procedure we request: If you have artificial nails, gel coating, SNS etc, please have those removed prior to your surgery/procedure. Not having the nail coverings /polish removed may result in  cancellation or delay of your surgery/procedure.  Your support person will be asked to wait in the waiting room during your procedure.  It is OK to have someone drop you off and come back when you are ready to be discharged.  You cannot drive after the procedure and will need someone to drive you home.  Bring your insurance cards.  *Special Note: Every effort is made to have your procedure done on time. Occasionally there are emergencies that occur at the hospital that may cause delays. Please be patient if a delay does occur.

## 2024-01-05 ENCOUNTER — Encounter: Payer: Self-pay | Admitting: Cardiology

## 2024-01-05 ENCOUNTER — Encounter (HOSPITAL_BASED_OUTPATIENT_CLINIC_OR_DEPARTMENT_OTHER): Payer: Self-pay

## 2024-01-09 NOTE — Progress Notes (Signed)
 Mitral regurgitation referral.  Cath and TEE scheduled for 8/15.  Last chest wall echo was in March.  Smoker-PFTs ordered

## 2024-01-10 NOTE — Telephone Encounter (Signed)
 Reviewed instructions with patient's wife (DPR)

## 2024-01-12 ENCOUNTER — Encounter (HOSPITAL_COMMUNITY)
Admission: RE | Disposition: A | Discharge status: Home / Self Care | Payer: Self-pay | Source: Home / Self Care | Attending: Cardiology

## 2024-01-12 ENCOUNTER — Ambulatory Visit (HOSPITAL_COMMUNITY): Admitting: Anesthesiology

## 2024-01-12 ENCOUNTER — Other Ambulatory Visit: Payer: Self-pay

## 2024-01-12 ENCOUNTER — Ambulatory Visit (HOSPITAL_COMMUNITY)
Admission: RE | Admit: 2024-01-12 | Discharge: 2024-01-12 | Disposition: A | Discharge status: Home / Self Care | Attending: Cardiology | Admitting: Cardiology

## 2024-01-12 ENCOUNTER — Ambulatory Visit (HOSPITAL_BASED_OUTPATIENT_CLINIC_OR_DEPARTMENT_OTHER)

## 2024-01-12 DIAGNOSIS — I341 Nonrheumatic mitral (valve) prolapse: Secondary | ICD-10-CM | POA: Insufficient documentation

## 2024-01-12 DIAGNOSIS — E785 Hyperlipidemia, unspecified: Secondary | ICD-10-CM | POA: Diagnosis not present

## 2024-01-12 DIAGNOSIS — I10 Essential (primary) hypertension: Secondary | ICD-10-CM | POA: Diagnosis not present

## 2024-01-12 DIAGNOSIS — I110 Hypertensive heart disease with heart failure: Secondary | ICD-10-CM | POA: Insufficient documentation

## 2024-01-12 DIAGNOSIS — I4891 Unspecified atrial fibrillation: Secondary | ICD-10-CM | POA: Insufficient documentation

## 2024-01-12 DIAGNOSIS — Z87891 Personal history of nicotine dependence: Secondary | ICD-10-CM | POA: Diagnosis not present

## 2024-01-12 DIAGNOSIS — I340 Nonrheumatic mitral (valve) insufficiency: Secondary | ICD-10-CM

## 2024-01-12 DIAGNOSIS — I5032 Chronic diastolic (congestive) heart failure: Secondary | ICD-10-CM | POA: Insufficient documentation

## 2024-01-12 DIAGNOSIS — I2510 Atherosclerotic heart disease of native coronary artery without angina pectoris: Secondary | ICD-10-CM | POA: Diagnosis not present

## 2024-01-12 HISTORY — PX: RIGHT/LEFT HEART CATH AND CORONARY ANGIOGRAPHY: CATH118266

## 2024-01-12 HISTORY — PX: TRANSESOPHAGEAL ECHOCARDIOGRAM (CATH LAB): EP1270

## 2024-01-12 LAB — POCT I-STAT 7, (LYTES, BLD GAS, ICA,H+H)
Acid-base deficit: 5 mmol/L — ABNORMAL HIGH (ref 0.0–2.0)
Bicarbonate: 20.6 mmol/L (ref 20.0–28.0)
Calcium, Ion: 1.21 mmol/L (ref 1.15–1.40)
HCT: 45 % (ref 39.0–52.0)
Hemoglobin: 15.3 g/dL (ref 13.0–17.0)
O2 Saturation: 93 %
Potassium: 4.1 mmol/L (ref 3.5–5.1)
Sodium: 140 mmol/L (ref 135–145)
TCO2: 22 mmol/L (ref 22–32)
pCO2 arterial: 37.8 mmHg (ref 32–48)
pH, Arterial: 7.344 — ABNORMAL LOW (ref 7.35–7.45)
pO2, Arterial: 69 mmHg — ABNORMAL LOW (ref 83–108)

## 2024-01-12 LAB — POCT I-STAT EG7
Acid-base deficit: 3 mmol/L — ABNORMAL HIGH (ref 0.0–2.0)
Acid-base deficit: 3 mmol/L — ABNORMAL HIGH (ref 0.0–2.0)
Bicarbonate: 23.3 mmol/L (ref 20.0–28.0)
Bicarbonate: 23.9 mmol/L (ref 20.0–28.0)
Calcium, Ion: 1.25 mmol/L (ref 1.15–1.40)
Calcium, Ion: 1.25 mmol/L (ref 1.15–1.40)
HCT: 46 % (ref 39.0–52.0)
HCT: 47 % (ref 39.0–52.0)
Hemoglobin: 15.6 g/dL (ref 13.0–17.0)
Hemoglobin: 16 g/dL (ref 13.0–17.0)
O2 Saturation: 65 %
O2 Saturation: 69 %
Potassium: 4.2 mmol/L (ref 3.5–5.1)
Potassium: 4.2 mmol/L (ref 3.5–5.1)
Sodium: 141 mmol/L (ref 135–145)
Sodium: 141 mmol/L (ref 135–145)
TCO2: 25 mmol/L (ref 22–32)
TCO2: 25 mmol/L (ref 22–32)
pCO2, Ven: 45.9 mmHg (ref 44–60)
pCO2, Ven: 46.5 mmHg (ref 44–60)
pH, Ven: 7.314 (ref 7.25–7.43)
pH, Ven: 7.318 (ref 7.25–7.43)
pO2, Ven: 37 mmHg (ref 32–45)
pO2, Ven: 39 mmHg (ref 32–45)

## 2024-01-12 LAB — ECHO TEE

## 2024-01-12 SURGERY — TRANSESOPHAGEAL ECHOCARDIOGRAM (TEE) (CATHLAB)
Anesthesia: Monitor Anesthesia Care

## 2024-01-12 SURGERY — RIGHT/LEFT HEART CATH AND CORONARY ANGIOGRAPHY
Anesthesia: LOCAL

## 2024-01-12 MED ORDER — LIDOCAINE 2% (20 MG/ML) 5 ML SYRINGE
INTRAMUSCULAR | Status: DC | PRN
  Administered 2024-01-12: 60 mg via INTRAVENOUS

## 2024-01-12 MED ORDER — ONDANSETRON HCL 4 MG/2ML IJ SOLN: 4.0000 mg | Freq: Four times a day (QID) | INTRAMUSCULAR | Status: DC | PRN

## 2024-01-12 MED ORDER — SODIUM CHLORIDE 0.9% FLUSH: 3.0000 mL | Freq: Two times a day (BID) | INTRAVENOUS | Status: DC

## 2024-01-12 MED ORDER — SODIUM CHLORIDE 0.9% FLUSH: 3.0000 mL | INTRAVENOUS | Status: DC | PRN

## 2024-01-12 MED ORDER — ACETAMINOPHEN 325 MG PO TABS: 650.0000 mg | ORAL_TABLET | ORAL | Status: DC | PRN

## 2024-01-12 MED ORDER — PHENYLEPHRINE HCL (PRESSORS) 10 MG/ML IV SOLN
INTRAVENOUS | Status: DC | PRN
  Administered 2024-01-12 (×4): 80 ug via INTRAVENOUS

## 2024-01-12 MED ORDER — PROPOFOL 500 MG/50ML IV EMUL
INTRAVENOUS | Status: DC | PRN
  Administered 2024-01-12: 160 ug/kg/min via INTRAVENOUS

## 2024-01-12 MED ORDER — VERAPAMIL HCL 2.5 MG/ML IV SOLN
INTRAVENOUS | Status: DC | PRN
  Administered 2024-01-12: 10 mL via INTRA_ARTERIAL

## 2024-01-12 MED ORDER — SODIUM CHLORIDE 0.9 % IV SOLN: 250.0000 mL | INTRAVENOUS | Status: DC | PRN

## 2024-01-12 MED ORDER — FENTANYL CITRATE (PF) 100 MCG/2ML IJ SOLN
INTRAMUSCULAR | Status: DC | PRN
  Administered 2024-01-12: 25 ug via INTRAVENOUS

## 2024-01-12 MED ORDER — HEPARIN SODIUM (PORCINE) 1000 UNIT/ML IJ SOLN
INTRAMUSCULAR | Status: AC
  Filled 2024-01-12: qty 10

## 2024-01-12 MED ORDER — HEPARIN (PORCINE) IN NACL 1000-0.9 UT/500ML-% IV SOLN
INTRAVENOUS | Status: DC | PRN
  Administered 2024-01-12 (×2): 500 mL

## 2024-01-12 MED ORDER — VERAPAMIL HCL 2.5 MG/ML IV SOLN
INTRAVENOUS | Status: AC
  Filled 2024-01-12: qty 2

## 2024-01-12 MED ORDER — MIDAZOLAM HCL 2 MG/2ML IJ SOLN
INTRAMUSCULAR | Status: DC | PRN
Start: 2024-01-12 — End: 2024-01-12
  Administered 2024-01-12: 2 mg via INTRAVENOUS

## 2024-01-12 MED ORDER — PROPOFOL 10 MG/ML IV BOLUS
INTRAVENOUS | Status: DC | PRN
  Administered 2024-01-12: 50 mg via INTRAVENOUS

## 2024-01-12 MED ORDER — HEPARIN SODIUM (PORCINE) 1000 UNIT/ML IJ SOLN
INTRAMUSCULAR | Status: DC | PRN
  Administered 2024-01-12: 4000 [IU] via INTRAVENOUS

## 2024-01-12 MED ORDER — IOHEXOL 350 MG/ML SOLN
INTRAVENOUS | Status: DC | PRN
  Administered 2024-01-12: 12 mL via INTRA_ARTERIAL

## 2024-01-12 MED ORDER — LIDOCAINE HCL (PF) 1 % IJ SOLN
INTRAMUSCULAR | Status: DC | PRN
  Administered 2024-01-12: 5 mL via INTRADERMAL

## 2024-01-12 MED ORDER — ASPIRIN 81 MG PO CHEW: 81.0000 mg | CHEWABLE_TABLET | ORAL | Status: DC

## 2024-01-12 MED ORDER — FREE WATER: 500.0000 mL | Freq: Once | Status: DC

## 2024-01-12 MED ORDER — ONDANSETRON HCL 4 MG/2ML IJ SOLN
INTRAMUSCULAR | Status: DC | PRN
  Administered 2024-01-12: 4 mg via INTRAVENOUS

## 2024-01-12 MED ORDER — LIDOCAINE HCL (PF) 1 % IJ SOLN
INTRAMUSCULAR | Status: AC
  Filled 2024-01-12: qty 30

## 2024-01-12 MED ORDER — FENTANYL CITRATE (PF) 100 MCG/2ML IJ SOLN
INTRAMUSCULAR | Status: AC
  Filled 2024-01-12: qty 2

## 2024-01-12 MED ORDER — MIDAZOLAM HCL 2 MG/2ML IJ SOLN
INTRAMUSCULAR | Status: AC
  Filled 2024-01-12: qty 2

## 2024-01-12 SURGICAL SUPPLY — 11 items
CATH BALLN WEDGE 5F 110CM (CATHETERS) IMPLANT
CATH INFINITI AMBI 5FR TG (CATHETERS) IMPLANT
DEVICE RAD COMP TR BAND LRG (VASCULAR PRODUCTS) IMPLANT
GLIDESHEATH SLEND A-KIT 6F 22G (SHEATH) IMPLANT
GUIDEWIRE INQWIRE 1.5J.035X260 (WIRE) IMPLANT
KIT SINGLE USE MANIFOLD (KITS) IMPLANT
KIT SYRINGE INJ CVI SPIKEX1 (MISCELLANEOUS) IMPLANT
PACK CARDIAC CATHETERIZATION (CUSTOM PROCEDURE TRAY) ×2 IMPLANT
SET ATX-X65L (MISCELLANEOUS) IMPLANT
SHEATH GLIDE SLENDER 4/5FR (SHEATH) IMPLANT
SHEATH PROBE COVER 6X72 (BAG) IMPLANT

## 2024-01-12 NOTE — Interval H&P Note (Signed)
 History and Physical Interval Note:  01/12/2024 8:09 AM  Jonathan [name]  has presented today for surgery, with the diagnosis of MITRAL REGURGITATION.  The various methods of treatment have been discussed with the patient and family. After consideration of risks, benefits and other options for treatment, the patient has consented to  Procedure(s): TRANSESOPHAGEAL ECHOCARDIOGRAM (N/A) as a surgical intervention.  The patient's history has been reviewed, patient examined, no change in status, stable for surgery.  I have reviewed the patient's chart and labs.  Questions were answered to the patient's satisfaction.     Jonathan [name]

## 2024-01-12 NOTE — CV Procedure (Signed)
     TRANSESOPHAGEAL ECHOCARDIOGRAM   NAME:  Jonathan [name]   MRN: [id number] DOB:  03/04/1956   ADMIT DATE: 01/12/2024  INDICATIONS: MR  PROCEDURE:   Informed consent was obtained prior to the procedure. The risks, benefits and alternatives for the procedure were discussed and the patient comprehended these risks.  Risks include, but are not limited to, cough, sore throat, vomiting, nausea, somnolence, esophageal and stomach trauma or perforation, bleeding, low blood pressure, aspiration, pneumonia, infection, trauma to the teeth and death.    After a procedural time-out, the oropharynx was anesthetized and the patient was sedated by the anesthesia service. The transesophageal probe was inserted in the esophagus and stomach without difficulty and multiple views were obtained. Anesthesia was monitored by Olivia Blower, CRNA.    COMPLICATIONS:    There were no immediate complications.  FINDINGS:  Severe prolapse of P1/2 with severe MR  Lonni Nanas MD [facility]  [address], [address] [place], [place] [address] [phone]   9:45 AM

## 2024-01-12 NOTE — Transfer of Care (Signed)
 Immediate Anesthesia Transfer of Care Note  Patient: Jonathan [name]  Procedure(s) Performed: TRANSESOPHAGEAL ECHOCARDIOGRAM  Patient Location: Cath Lab  Anesthesia Type:MAC  Level of Consciousness: awake, oriented, and drowsy  Airway & Oxygen Therapy: Patient Spontanous Breathing and Patient connected to nasal cannula oxygen  Post-op Assessment: Report given to RN, Post -op Vital signs reviewed and stable, and Patient moving all extremities X 4  Post vital signs: Reviewed and stable  Last Vitals:  Vitals Value Taken Time  BP    Temp    Pulse 63 01/12/24 09:53  Resp 11 01/12/24 09:53  SpO2 92 % 01/12/24 09:53  Vitals shown include unfiled device data.  Last Pain:  Vitals:   01/12/24 0753  TempSrc: Temporal         Complications: No notable events documented.

## 2024-01-12 NOTE — Interval H&P Note (Signed)
 History and Physical Interval Note:  01/12/2024 10:38 AM  Jonathan [name]  has presented today for surgery, with the diagnosis of mitral reguritation.  The various methods of treatment have been discussed with the patient and family. After consideration of risks, benefits and other options for treatment, the patient has consented to  Procedure(s): RIGHT/LEFT HEART CATH AND CORONARY ANGIOGRAPHY (N/A) as a surgical intervention.  The patient's history has been reviewed, patient examined, no change in status, stable for surgery.  I have reviewed the patient's chart and labs.  Questions were answered to the patient's satisfaction.     Gordy Bergamo

## 2024-01-12 NOTE — Anesthesia Postprocedure Evaluation (Signed)
 Anesthesia Post Note  Patient: Jonathan [name]  Procedure(s) Performed: TRANSESOPHAGEAL ECHOCARDIOGRAM     Patient location during evaluation: PACU Anesthesia Type: MAC Level of consciousness: awake and alert Pain management: pain level controlled Vital Signs Assessment: post-procedure vital signs reviewed and stable Respiratory status: spontaneous breathing, nonlabored ventilation, respiratory function stable and patient connected to nasal cannula oxygen Cardiovascular status: stable and blood pressure returned to baseline Postop Assessment: no apparent nausea or vomiting Anesthetic complications: no   No notable events documented.  Last Vitals:  Vitals:   01/12/24 1000 01/12/24 1018  BP: 110/84 (!) 115/100  Pulse: (!) 53 (!) 51  Resp: 19 20  Temp:    SpO2: 97% 95%    Last Pain:  Vitals:   01/12/24 1102  TempSrc:   PainSc: 0-No pain                 Cordella P Aristotle Lieb

## 2024-01-12 NOTE — Progress Notes (Signed)
  Echocardiogram Echocardiogram Transesophageal has been performed.  Jonathan [name] 01/12/2024, 9:54 AM

## 2024-01-12 NOTE — Anesthesia Preprocedure Evaluation (Signed)
 Anesthesia Evaluation  Patient identified by MRN, date of birth, ID band Patient awake    Reviewed: Allergy & Precautions, NPO status , Patient's Chart, lab work & pertinent test results  Airway Mallampati: II  TM Distance: >3 FB Neck ROM: Full    Dental no notable dental hx.    Pulmonary neg pulmonary ROS, former smoker   Pulmonary exam normal        Cardiovascular hypertension,  Rhythm:Regular Rate:Normal + Systolic murmurs    Neuro/Psych   Anxiety     negative neurological ROS     GI/Hepatic negative GI ROS, Neg liver ROS,,,  Endo/Other  negative endocrine ROS    Renal/GU negative Renal ROS  negative genitourinary   Musculoskeletal  (+) Arthritis , Osteoarthritis,    Abdominal Normal abdominal exam  (+)   Peds  Hematology Lab Results      Component                Value               Date                      WBC                      3.8 (L)             01/02/2024                HGB                      17.2 (H)            01/02/2024                HCT                      49.4                01/02/2024                MCV                      80.7                01/02/2024                PLT                      266                 01/02/2024             Lab Results      Component                Value               Date                      NA                       135                 01/02/2024                K  3.8                 01/02/2024                CO2                      22                  01/02/2024                GLUCOSE                  90                  01/02/2024                BUN                      11                  01/02/2024                CREATININE               0.93                01/02/2024                CALCIUM                   9.1                 01/02/2024                GFR                      59.84 (L)           11/29/2021                EGFR                      93                  08/03/2022                GFRNONAA                 >60                 01/02/2024              Anesthesia Other Findings   Reproductive/Obstetrics                              Anesthesia Physical Anesthesia Plan  ASA: 3  Anesthesia Plan: MAC   Post-op Pain Management:    Induction: Intravenous  PONV Risk Score and Plan: 1 and Propofol  infusion and Treatment may vary due to age or medical condition  Airway Management Planned: Simple Face Mask and Nasal Cannula  Additional Equipment: None  Intra-op Plan:   Post-operative Plan:   Informed Consent: I have reviewed the patients History and Physical, chart, labs and discussed the procedure including the risks, benefits and alternatives for the proposed anesthesia with the patient or authorized representative who has indicated his/her understanding and acceptance.     Dental advisory given  Plan Discussed with: CRNA  Anesthesia Plan Comments:         Anesthesia Quick Evaluation

## 2024-01-13 ENCOUNTER — Encounter (HOSPITAL_COMMUNITY): Payer: Self-pay | Admitting: Cardiology

## 2024-01-16 ENCOUNTER — Ambulatory Visit: Admitting: Student

## 2024-02-01 NOTE — Progress Notes (Unsigned)
 Cardiology Office Note:   Date:  02/02/2024  ID:  Jonathan [name], DOB Aug 28, 1955, MRN [id number] PCP: Jonathan Leni Edyth DELENA, MD  [facility] Providers Cardiologist:  Jonathan Schilling, MD {  History of Present Illness:   Jonathan [name] is a 68 y.o. male  who I saw back in  2013. He was seen by Jonathan Beauvais NP in 2020 for evaluation of chronic diastolic HF.    He also has bradycardia.  He has had some chronic diastolic dysfunction.  In June 2022 I sent him for an echo with a myxomatous mitral valve prolapse of both leaflets.  The MR was at least moderate.  TEE confirmed this to be moderate and suggested continued follow-up.  ERO is 0.25 cm2, Regurgitant volume was 39 cc.  On follow up TTE this June he had moderate to severe MR.  LA size was 37 mm with an EF of 60 - 65%.    I sent him for a TEE.  This demonstrated moderate to severe MR.    He was seen by Jonathan [name] in June 2024 and the decision was to follow him clinically.  He has since developed atrial fib.   He had follow up echo in March and had severe MR.    He returns for follow up. He has had bradycardia.  He scheduled to meet with surgeon at [facility] next week.  The patient denies any new symptoms such as chest discomfort, neck or arm discomfort. There has been no new shortness of breath, PND or orthopnea. There have been no reported palpitations, presyncope or syncope.   ROS: As stated in the HPI and negative for all other systems.  Studies Reviewed:    EKG:     NA  Risk Assessment/Calculations:    CHA2DS2-VASc Score = 2   This indicates a 2.2% annual risk of stroke. The patient's score is based upon: CHF History: 0 HTN History: 1 Diabetes History: 0 Stroke History: 0 Vascular Disease History: 0 Age Score: 1 Gender Score: 0     Physical Exam:   VS:  BP 124/72   Pulse 64   Ht 6' 1 (1.854 m)   Wt 212 lb 6.4 oz (96.3 kg)   SpO2 99%   BMI 28.02 kg/m    Wt Readings from Last 3 Encounters:  02/02/24 212 lb  6.4 oz (96.3 kg)  01/04/24 210 lb 4.8 oz (95.4 kg)  01/01/24 204 lb 3.2 oz (92.6 kg)     GEN: Well nourished, well developed in no acute distress NECK: No JVD; No carotid bruits CARDIAC: Irregular RR, 3 out of 6 holosystolic murmur heard at the apex, no diastolic murmurs, rubs, gallops RESPIRATORY:  Clear to auscultation without rales, wheezing or rhonchi  ABDOMEN: Soft, non-tender, non-distended EXTREMITIES:  No edema; No deformity   ASSESSMENT AND PLAN:   MR:  This is severe. I referred him to [facility] for MVR.  He had a cath in preparation and has mild circ disease.  We will continue with risk reduction.   He is being seen by Dr. Chyrl [name] at [facility] for mitral valve surgery.   Bradycardia: There is no indication for pacing at this point and determination for the need of long-term pacing will be made at the time of his surgery.   Essential hypertension: His blood pressure is at target.  No change in therapy.   Atrial fib: He is tolerating anticoagulation.  No change in therapy.   Dyslipidemia: I would  suggest a goal LDL of less than 100 with his mild coronary plaque.  Follow up with me after his valve surgery.  Signed, Jonathan Schilling, MD

## 2024-02-02 ENCOUNTER — Encounter: Payer: Self-pay | Admitting: Cardiology

## 2024-02-02 ENCOUNTER — Ambulatory Visit: Attending: Cardiology | Admitting: Cardiology

## 2024-02-02 VITALS — BP 124/72 | HR 64 | Ht 73.0 in | Wt 212.4 lb

## 2024-02-02 DIAGNOSIS — I340 Nonrheumatic mitral (valve) insufficiency: Secondary | ICD-10-CM

## 2024-02-02 DIAGNOSIS — R001 Bradycardia, unspecified: Secondary | ICD-10-CM | POA: Diagnosis not present

## 2024-02-02 DIAGNOSIS — I480 Paroxysmal atrial fibrillation: Secondary | ICD-10-CM | POA: Diagnosis not present

## 2024-02-02 DIAGNOSIS — E785 Hyperlipidemia, unspecified: Secondary | ICD-10-CM

## 2024-02-02 NOTE — Patient Instructions (Signed)
 Medication Instructions:  Your physician recommends that you continue on your current medications as directed. Please refer to the Current Medication list given to you today.  *If you need a refill on your cardiac medications before your next appointment, please call your pharmacy*  Lab Work: NONE If you have labs (blood work) drawn today and your tests are completely normal, you will receive your results only by: MyChart Message (if you have MyChart) OR A paper copy in the mail If you have any lab test that is abnormal or we need to change your treatment, we will call you to review the results.  Testing/Procedures: NONE  Follow-Up: At [facility], you and your health needs are our priority.  As part of our continuing mission to provide you with exceptional heart care, our providers are all part of one team.  This team includes your primary Cardiologist (physician) and Advanced Practice Providers or APPs (Physician Assistants and Nurse Practitioners) who all work together to provide you with the care you need, when you need it.  Your next appointment:   4 month(s)  Provider:   Eilleen Grates, MD   We recommend signing up for the patient portal called MyChart.  Sign up information is provided on this After Visit Summary.  MyChart is used to connect with patients for Virtual Visits (Telemedicine).  Patients are able to view lab/test results, encounter notes, upcoming appointments, etc.  Non-urgent messages can be sent to your provider as well.   To learn more about what you can do with MyChart, go to [email or web address].

## 2024-02-28 ENCOUNTER — Telehealth: Payer: Self-pay | Admitting: Cardiology

## 2024-02-28 ENCOUNTER — Telehealth: Payer: Self-pay

## 2024-02-28 NOTE — Telephone Encounter (Signed)
   Pre-operative Risk Assessment    Patient Name: Jonathan [name]  DOB: [birth year] MRN: [id number]   Date of last office visit: 02/02/24 LYNWOOD SCHILLING, MD Date of next office visit: NONE  Request for Surgical Clearance    Procedure:  DENTAL CLEANING  Date of Surgery:  Clearance TBD                                Surgeon:  DR HERVEY JUMPER Surgeon's Group or Practice Name:  [facility] Phone number:  [phone] Fax number:  [phone]   Type of Clearance Requested:   - Medical  - Pharmacy:  Hold Apixaban  (Eliquis )     Type of Anesthesia:  Not Indicated   Additional requests/questions:    Signed, Lucie DELENA Ku   02/28/2024, 5:02 PM

## 2024-02-28 NOTE — Telephone Encounter (Signed)
 Routed to Dr.[name] to review   Scheduled for MV surgery with Dr. Gaca 03/18/24

## 2024-02-28 NOTE — Telephone Encounter (Signed)
 Pt c/o medication issue:  1. Name of Medication:   Antibiotics  2. How are you currently taking this medication (dosage and times per day)?   Not currently taking  3. Are you having a reaction (difficulty breathing--STAT)?   4. What is your medication issue?   Caller Kandice) stated patient will need a prescription for antibiotics sent to [id number] [place] [place] - [address] to get his teeth cleaned.  Caller wants call to [facility] at phone# [phone] to confirm.

## 2024-02-28 NOTE — Telephone Encounter (Signed)
 Calling back to give correct number to Dentist office. Number below. Please advise  [phone]

## 2024-02-29 NOTE — Telephone Encounter (Signed)
   Patient Name: Jonathan [name]  DOB: 07-23-1955 MRN: [id number]  Primary Cardiologist: Lynwood Schilling, MD  Chart reviewed as part of pre-operative protocol coverage.   Pt has severe mitral regurgitation and is pending MV surgery with Nikiesha Milford. Pt does not have a history of endocarditis or valve prosthetic material currently in place. This places the patient at intermediate risk and SBE PPX is not routinely recommended for dental cleanings. Should he need more invasive manipulation of the gingival tissue, it would be reasonable to consider ABX prior to procedures.   I will route this recommendation to the requesting party via Epic fax function and remove from pre-op pool.  Please call with questions.  [name] Amaziah Ghosh, PA 02/29/2024, 7:11 AM

## 2024-03-01 MED ORDER — AMOXICILLIN 500 MG PO TABS: 2000.0000 mg | ORAL_TABLET | Freq: Once | ORAL | 0 refills | Status: DC

## 2024-03-01 NOTE — Telephone Encounter (Signed)
 Spoke with Jonathan [name] per DPR. She is aware of prescription. She verbalized understanding. All questions if any were answered.

## 2024-03-21 ENCOUNTER — Telehealth (HOSPITAL_COMMUNITY): Payer: Self-pay

## 2024-03-21 NOTE — Telephone Encounter (Signed)
 Received cardiac rehab referral for pt from [facility], contacted pt and spoke with his wife Lamarr and she stated that pt was currently in the hospital at [facility] and that he was coming home the next day. Pt wife stated that pt cardiologist was Dr. Lavona, I advised pt wife to get Dr. Lavona to place a cardiac rehab referral for pt because he had to do a follow up with his cardiologist before he started. Pt's wife stated she understood.

## 2024-03-22 ENCOUNTER — Telehealth (HOSPITAL_COMMUNITY): Payer: Self-pay

## 2024-03-22 NOTE — Telephone Encounter (Signed)
 Spoke with pt's wife and she stated that pt is interested in the cardiac rehab program. I advised pt's wife to get Dr. Lavona to place cardiac rehab referral pt's wife understood but pt doesn't have a f/u with cardiologist until 11/26. Will place pt in f/u. Pt also has a Careers adviser follow up at [facility] on 11/13.

## 2024-03-22 NOTE — Telephone Encounter (Signed)
 Pt insurance is active and benefits verified through [facility] Medicare Dual Co-pay 0, DED $257/$257 met, out of pocket $9,350/$1,437.28 met, co-insurance 20%. no pre-authorization required. Passport, 03/22/2024@1 :22, REF# [id number]  2ndary insurance is active and benefits verified through Medicaid. Co-pay 0, DED 0/0 met, out of pocket 0/0 met, co-insurance 0%. No pre-authorization required.   TCR/ICR? ICR Visit(date of service)limitation? No limit Can multiple codes be used on the same date of service/visit?(IF ITS A LIMIT) n/a  Is this a lifetime maximum or an annual maximum? annual Has the member used any of these services to date? no Is there a time limit (weeks/months) on start of program and/or program completion? no

## 2024-03-25 ENCOUNTER — Telehealth: Payer: Self-pay | Admitting: Cardiology

## 2024-03-25 NOTE — Telephone Encounter (Signed)
 Please review and advise.

## 2024-03-25 NOTE — Telephone Encounter (Signed)
 Pts partner calling to ask if her husband could have an order for an echo. Says it was suggested by his surgeon after his recent procedure. Please advise.

## 2024-03-25 NOTE — Telephone Encounter (Signed)
 Pt had TEE in 12/2023. Do you want to order another echo?

## 2024-03-25 NOTE — Telephone Encounter (Signed)
 Called back to say that the patient is also needing blood work. This is a request by Dr. Ricky at [facility]. Please advise

## 2024-03-26 ENCOUNTER — Telehealth: Payer: Self-pay | Admitting: Cardiology

## 2024-03-26 NOTE — Telephone Encounter (Signed)
 Jonathan [name] is following up--she says to disregard the previous message. She says she called Dr. Reyes Lawyer office and she was advised that echo, CBC, and xray(s) will be completed during when he goes to their office on 11/13. Orders are not needed.

## 2024-03-26 NOTE — Telephone Encounter (Signed)
 Jerel Francina BRAVO KT   03/26/24 12:18 PM Note Lamarr is following up--she says to disregard the previous message. She says she called Dr. Reyes Lawyer office and she was advised that echo, CBC, and xray(s) will be completed during when he goes to their office on 11/13. Orders are not needed.     Will complete this encounter for now.

## 2024-03-26 NOTE — Telephone Encounter (Signed)
 Wife Kandice) stated patient wants to get order for CBC, Echocardiogram tests and referral to cardiac rehab.

## 2024-03-27 ENCOUNTER — Inpatient Hospital Stay: Admitting: Emergency Medicine

## 2024-03-30 ENCOUNTER — Other Ambulatory Visit: Payer: Self-pay | Admitting: Physician Assistant

## 2024-03-30 DIAGNOSIS — K2100 Gastro-esophageal reflux disease with esophagitis, without bleeding: Secondary | ICD-10-CM

## 2024-04-01 ENCOUNTER — Telehealth: Payer: Self-pay | Admitting: Physician Assistant

## 2024-04-01 DIAGNOSIS — K2100 Gastro-esophageal reflux disease with esophagitis, without bleeding: Secondary | ICD-10-CM

## 2024-04-01 MED ORDER — DEXLANSOPRAZOLE 60 MG PO CPDR
60.0000 mg | DELAYED_RELEASE_CAPSULE | Freq: Every day | ORAL | 0 refills | Status: DC
Start: 2024-04-01 — End: 2024-04-19

## 2024-04-01 NOTE — Telephone Encounter (Signed)
 The pt is not able to come in for appt at this time due to recent open heart surgery.  I have sent in a refill for 1 month per request until the pt is able to get out for appts. I offered to make appt but offer declined due to not knowing when they are able. Will call back to make appt

## 2024-04-01 NOTE — Telephone Encounter (Signed)
 Inbound call form Jonathan [name] in regards to this patient stating that the patient just had open heart surgery in [facility] and is needing a refill on his Dexilant  60 MG capsules and would not be able to come in person for an office visit. Jonathan [name] is wondering if his follow up can be done virtually or over the phone. Jonathan [name] is requesting a call back at [phone]. Please advise.

## 2024-04-09 NOTE — Telephone Encounter (Signed)
 Noted

## 2024-04-09 NOTE — Telephone Encounter (Signed)
 Patient has been scheduled for next available appointment 12/29. Advised patient's wife to call back when patient runs out of medication.

## 2024-04-19 ENCOUNTER — Telehealth: Payer: Self-pay | Admitting: Physician Assistant

## 2024-04-19 DIAGNOSIS — K2100 Gastro-esophageal reflux disease with esophagitis, without bleeding: Secondary | ICD-10-CM

## 2024-04-19 MED ORDER — DEXLANSOPRAZOLE 60 MG PO CPDR: 60.0000 mg | DELAYED_RELEASE_CAPSULE | Freq: Every day | ORAL | 1 refills | Status: DC

## 2024-04-19 NOTE — Telephone Encounter (Signed)
Dexilant sent to pharmacy 

## 2024-04-19 NOTE — Telephone Encounter (Signed)
 Inbound call from patient friend stating that he is needing a refill on his medication called dexlant 60 MG. Please advise.

## 2024-04-22 NOTE — Progress Notes (Unsigned)
  Cardiology Office Note:   Date:  04/24/2024  ID:  Jonathan [name], DOB 11-19-1955, MRN [id number] PCP: Maree Leni Edyth DELENA, MD  [facility] Providers Cardiologist:  Lynwood Schilling, MD {  History of Present Illness:   Jonathan [name] is a 67 y.o. male with severe MR.  He had 38 mm ring repair, neo chord placement, cleft closure (between P1 and P2) at [facility] by Dr. Chyrl Fruits .  At that time he had a surgical MAZE and atrial appendage ligation.   I thoroughly reviewed the [facility] records for this visit.  He did very well with that.  He has been doing a little bit of activity around the house. The patient denies any new symptoms such as chest discomfort, neck or arm discomfort. There has been no new shortness of breath, PND or orthopnea. There have been no reported palpitations, presyncope or syncope.  His blood pressure has been drifting down slightly on the low side at home.  Heart rates have been in the 70s and has not felt any tachypalpitations consistent with previous atrial arrhythmias.      ROS:As stated in the HPI and negative for all other systems.  Studies Reviewed:    EKG:     NA  Risk Assessment/Calculations:    CHA2DS2-VASc Score = 2   This indicates a 2.2% annual risk of stroke. The patient's score is based upon: CHF History: 0 HTN History: 1 Diabetes History: 0 Stroke History: 0 Vascular Disease History: 0 Age Score: 1 Gender Score: 0   Physical Exam:   VS:  BP 102/80 (BP Location: Left Arm, Patient Position: Sitting, Cuff Size: Large)   Pulse 90   Ht 6' 1.5 (1.867 m)   Wt 210 lb 8 oz (95.5 kg)   SpO2 97%   BMI 27.40 kg/m    Wt Readings from Last 3 Encounters:  04/24/24 210 lb 8 oz (95.5 kg)  02/02/24 212 lb 6.4 oz (96.3 kg)  01/04/24 210 lb 4.8 oz (95.4 kg)     GEN: Well nourished, well developed in no acute distress NECK: No JVD; No carotid bruits CARDIAC: RRR, no murmurs, rubs, gallops RESPIRATORY:  Clear to auscultation without rales,  wheezing or rhonchi  ABDOMEN: Soft, non-tender, non-distended EXTREMITIES:  No edema; No deformity   ASSESSMENT AND PLAN:   MR:  He is status post repair.   He understands endocarditis prophylaxis.  He had a small stitch that I clipped in the upper incision in his chest that was irritating him.  This was cleaned and he will keep an eye on this.  I will follow-up with an echo next year.   Bradycardia: He is not having any symptoms related to this.  No change in therapy.   Essential hypertension: His blood pressure is actually running low.  If it continues systolics consistently below 110 I probably cut back on his amlodipine  and his wife will let me know.   Atrial fib: He is tolerating anticoagulation.  He can stop his aspirin .  If in 6 months with the procedure he had above there is no evidence of recurrent fibrillation I might switch him to aspirin  only.   Dyslipidemia: Goal LDL is less than 100.  No change in therapy.      Follow up with me in six months.  gned, Lynwood Schilling, MD

## 2024-04-24 ENCOUNTER — Ambulatory Visit: Attending: Cardiology | Admitting: Cardiology

## 2024-04-24 ENCOUNTER — Encounter: Payer: Self-pay | Admitting: Cardiology

## 2024-04-24 VITALS — BP 102/80 | HR 90 | Ht 73.5 in | Wt 210.5 lb

## 2024-04-24 DIAGNOSIS — I4819 Other persistent atrial fibrillation: Secondary | ICD-10-CM

## 2024-04-24 DIAGNOSIS — I341 Nonrheumatic mitral (valve) prolapse: Secondary | ICD-10-CM

## 2024-04-24 NOTE — Patient Instructions (Signed)
 Medication Instructions:  Stop Aspirin  *If you need a refill on your cardiac medications before your next appointment, please call your pharmacy*  Lab Work: NONE If you have labs (blood work) drawn today and your tests are completely normal, you will receive your results only by: MyChart Message (if you have MyChart) OR A paper copy in the mail If you have any lab test that is abnormal or we need to change your treatment, we will call you to review the results.  Testing/Procedures: NONE  Follow-Up: At [facility], you and your health needs are our priority.  As part of our continuing mission to provide you with exceptional heart care, our providers are all part of one team.  This team includes your primary Cardiologist (physician) and Advanced Practice Providers or APPs (Physician Assistants and Nurse Practitioners) who all work together to provide you with the care you need, when you need it.  Your next appointment:   6 month(s)  Provider:   Lynwood Schilling, MD    We recommend signing up for the patient portal called MyChart.  Sign up information is provided on this After Visit Summary.  MyChart is used to connect with patients for Virtual Visits (Telemedicine).  Patients are able to view lab/test results, encounter notes, upcoming appointments, etc.  Non-urgent messages can be sent to your provider as well.   To learn more about what you can do with MyChart, go to [email or web address].

## 2024-04-29 ENCOUNTER — Other Ambulatory Visit (HOSPITAL_COMMUNITY): Payer: Self-pay | Admitting: *Deleted

## 2024-04-29 DIAGNOSIS — Z9889 Other specified postprocedural states: Secondary | ICD-10-CM

## 2024-04-30 ENCOUNTER — Telehealth (HOSPITAL_COMMUNITY): Payer: Self-pay

## 2024-04-30 NOTE — Telephone Encounter (Signed)
 Attempted to schedule cardiac rehab- no answer, left message. Sent MyChart message.

## 2024-04-30 NOTE — Telephone Encounter (Signed)
 Received EKG tracing from [facility].

## 2024-05-02 ENCOUNTER — Telehealth (HOSPITAL_COMMUNITY): Payer: Self-pay

## 2024-05-02 NOTE — Telephone Encounter (Signed)
 Patient's wife called stating patient is interested in scheduling, informed her our schedule is now booked out until January and we will not be calling to schedule patients until that schedule opens up.

## 2024-05-27 ENCOUNTER — Ambulatory Visit: Admitting: Gastroenterology

## 2024-05-27 ENCOUNTER — Encounter: Payer: Self-pay | Admitting: Gastroenterology

## 2024-05-27 DIAGNOSIS — K219 Gastro-esophageal reflux disease without esophagitis: Secondary | ICD-10-CM | POA: Diagnosis not present

## 2024-05-27 DIAGNOSIS — K2100 Gastro-esophageal reflux disease with esophagitis, without bleeding: Secondary | ICD-10-CM

## 2024-05-27 MED ORDER — DEXLANSOPRAZOLE 60 MG PO CPDR: 60.0000 mg | DELAYED_RELEASE_CAPSULE | Freq: Every day | ORAL | 3 refills | Status: AC

## 2024-05-27 NOTE — Progress Notes (Signed)
 "    05/27/2024 Jonathan [name] [id number] 10/28/1955   Discussed the use of AI scribe software for clinical note transcription with the patient, who gave verbal consent to proceed.  History of Present Illness Jonathan [name] is a 68 year old male with gastroesophageal reflux disease who presents for medication refill.  He is a patient of Dr. Nandigam's.  Dexilant  has been effective for heartburn and reflux, with no current symptoms. He requests a ninety day supply. He avoids spicy and greasy foods, pork, and follows a Mediterranean diet for heart health. He does not smoke or drink alcohol . He prefers to eat and drink earlier in the day and tries not to eat late at night, though this remains a challenge. No issues with heartburn or reflux related to his current routine.  Appetite is good and bowel movements are regular. He notes mild bloating after eating Mediterranean food but denies other gastrointestinal complaints.  Had cardiac surgery with a Maze procedure for atrial fibrillation, mitral valve repair, and left atrial appendage ligation at [facility] last month.  Last colonoscopy 08/2019 with recall in 08/2026.   Past Medical History:  Diagnosis Date   Anxiety    Arthritis    Avascular necrosis of bone (HCC)    left hip    Back pain, lumbosacral 2000   Blind right eye    Colon polyps 08/29/2011   Hyperplastic colon polyps   Mitral valve prolapse    Past Surgical History:  Procedure Laterality Date   BUBBLE STUDY  11/25/2020   Procedure: BUBBLE STUDY;  Surgeon: Barbaraann Darryle Ned, MD;  Location: [facility] ENDOSCOPY;  Service: Cardiovascular;;   EYE SURGERY  2007   Fractured bones repaired/blind in right eye   HERNIA REPAIR     Right inguinal   RIGHT/LEFT HEART CATH AND CORONARY ANGIOGRAPHY N/A 01/12/2024   Procedure: RIGHT/LEFT HEART CATH AND CORONARY ANGIOGRAPHY;  Surgeon: Ladona Heinz, MD;  Location: [facility] INVASIVE CV LAB;  Service: Cardiovascular;  Laterality: N/A;   SPINE  SURGERY     TEE WITHOUT CARDIOVERSION N/A 11/25/2020   Procedure: TRANSESOPHAGEAL ECHOCARDIOGRAM (TEE);  Surgeon: Barbaraann Darryle Ned, MD;  Location: [facility] ENDOSCOPY;  Service: Cardiovascular;  Laterality: N/A;   TEE WITHOUT CARDIOVERSION N/A 08/10/2022   Procedure: TRANSESOPHAGEAL ECHOCARDIOGRAM (TEE);  Surgeon: Francyne Headland, MD;  Location: [facility] ENDOSCOPY;  Service: Cardiovascular;  Laterality: N/A;   TOTAL HIP ARTHROPLASTY Right 09/18/2014   Procedure: RIGHT TOTAL HIP ARTHROPLASTY ANTERIOR APPROACH;  Surgeon: Redell Shoals, MD;  Location: [facility] ORS;  Service: Orthopedics;  Laterality: Right;   TOTAL HIP ARTHROPLASTY Left 04/06/2017   Procedure: LEFT TOTAL HIP ARTHROPLASTY ANTERIOR APPROACH;  Surgeon: Shoals Redell, MD;  Location: [facility] ORS;  Service: Orthopedics;  Laterality: Left;  Needs RNFA   TRANSESOPHAGEAL ECHOCARDIOGRAM (CATH LAB) N/A 01/12/2024   Procedure: TRANSESOPHAGEAL ECHOCARDIOGRAM;  Surgeon: Kate Lonni LITTIE, MD;  Location: [facility] INVASIVE CV LAB;  Service: Cardiovascular;  Laterality: N/A;    reports that he has quit smoking. His smoking use included cigarettes and cigars. He has a 3 pack-year smoking history. He has never used smokeless tobacco. He reports that he does not currently use alcohol . He reports current drug use. Drug: Marijuana. family history includes Arthritis in his mother; Hypertension in his father; Stroke in his father and mother. Allergies[1]    Outpatient Encounter Medications as of 05/27/2024  Medication Sig   amLODipine  (NORVASC ) 10 MG tablet Take 10 mg by mouth daily.   apixaban  (ELIQUIS ) 5 MG TABS tablet Take  1 tablet (5 mg total) by mouth 2 (two) times daily.   Cholecalciferol (VITAMIN D3) 1000 units CAPS Take 1,000 Units by mouth daily.   dexlansoprazole  (DEXILANT ) 60 MG capsule Take 1 capsule (60 mg total) by mouth daily. **PLEASE CALL OFFICE TO SCHEDULE FOLLOW UP   dutasteride  (AVODART ) 0.5 MG capsule Take 0.5 mg by mouth daily.   dutasteride  (AVODART )  0.5 MG capsule Take 1 capsule (0.5 mg total) by mouth daily.   ezetimibe  (ZETIA ) 10 MG tablet Take 10 mg by mouth daily.   HYDROcodone -acetaminophen  (NORCO) 10-325 MG tablet Take 1 tablet by mouth every 6 (six) hours as needed for moderate pain.   ipratropium (ATROVENT) 0.06 % nasal spray Place 1-2 sprays into both nostrils daily as needed for rhinitis (allergies).   levocetirizine (XYZAL ) 5 MG tablet Take 5 mg by mouth daily as needed for allergies.   montelukast (SINGULAIR) 10 MG tablet Take 10 mg by mouth daily as needed (allergies).   naloxone  (NARCAN ) nasal spray 4 mg/0.1 mL Place 1 spray into the nose as needed (opioid overdose).   pravastatin  (PRAVACHOL ) 40 MG tablet Take 40 mg by mouth at bedtime.   No facility-administered encounter medications on file as of 05/27/2024.     REVIEW OF SYSTEMS  : All other systems reviewed and negative except where noted in the History of Present Illness.   PHYSICAL EXAM: BP 128/78   Pulse 74   Ht 6' 1 (1.854 m)   Wt 217 lb (98.4 kg)   SpO2 98%   BMI 28.63 kg/m  General: Well developed AA male in no acute distress Head: Normocephalic and atraumatic Eyes:  Sclerae anicteric, conjunctiva pink. Ears: Normal auditory acuity Lungs: Clear throughout to auscultation; no W/R/R. Heart: Regular rate and rhythm; no M/R/G. Musculoskeletal: Symmetrical with no gross deformities  Skin: No lesions on visible extremities Extremities: No edema  Neurological: Alert oriented x 4, grossly non-focal Psychological:  Alert and cooperative. Normal mood and affect  Assessment & Plan Gastroesophageal reflux disease Gastroesophageal reflux disease is currently well controlled and asymptomatic on Dexilant  with effective dietary modifications. No acute concerns and regimen is well tolerated. - Prescribed Dexilant  90-day supply to [facility]. - Discussed Voquezna as an alternative if Dexilant  becomes ineffective or is not covered; he prefers to continue  Dexilant . - Advised to avoid eating close to bedtime and to remain upright after meals, particularly given increased time in bed post-surgery. - Reinforced importance of annual to 20-month follow-up for medication management and symptom monitoring.   CC:  Maree Leni Edyth DELENA, MD       [1]  Allergies Allergen Reactions   Pravastatin  Other (See Comments)    Myalgias    Zetia  [Ezetimibe ] Other (See Comments)    Myalgias    Cortisone Itching and Rash   Lipitor [Atorvastatin  Calcium ] Other (See Comments)    Myalgias    "

## 2024-05-27 NOTE — Patient Instructions (Signed)
 We have sent the following medications to your pharmacy for you to pick up at your convenience: Dexilant  60 mg daily.   _______________________________________________________  If your blood pressure at your visit was 140/90 or greater, please contact your primary care physician to follow up on this.  _______________________________________________________  If you are age 70 or older, your body mass index should be between 23-30. Your Body mass index is 28.63 kg/m. If this is out of the aforementioned range listed, please consider follow up with your Primary Care Provider.  If you are age 70 or younger, your body mass index should be between 19-25. Your Body mass index is 28.63 kg/m. If this is out of the aformentioned range listed, please consider follow up with your Primary Care Provider.   ________________________________________________________  The [facility] providers would like to encourage you to use MYCHART to communicate with providers for non-urgent requests or questions.  Due to long hold times on the telephone, sending your provider a message by [facility] may be a faster and more efficient way to get a response.  Please allow 48 business hours for a response.  Please remember that this is for non-urgent requests.  _______________________________________________________  [facility] is using a team-based approach to care.  Your team is made up of your doctor and two to three APPS. Our APPS (Nurse Practitioners and Physician Assistants) work with your physician to ensure care continuity for you. They are fully qualified to address your health concerns and develop a treatment plan. They communicate directly with your gastroenterologist to care for you. Seeing the Advanced Practice Practitioners on your physician's team can help you by facilitating care more promptly, often allowing for earlier appointments, access to diagnostic testing, procedures, and other specialty  referrals.

## 2024-06-07 ENCOUNTER — Telehealth (HOSPITAL_COMMUNITY): Payer: Self-pay

## 2024-06-07 NOTE — Telephone Encounter (Signed)
 Called patient to see if he was interested in participating in the Cardiac Rehab Program. Patient will come in for orientation on 1/29 and will attend the 1:45 exercise class.  Pensions consultant.

## 2024-06-07 NOTE — Telephone Encounter (Signed)
 Pt insurance is active and benefits verified through [facility] [employer]. Co-pay $0, DED $283/$0 met, out of pocket $9,250/$0 met, co-insurance 20%. No pre-authorization required. Passport, 06/07/24 @ 9:31am, REF# [id number].  2ndary insurance is active and benefits verified through [facility]. Co-pay $0, DED $0/$0 met, out of pocket $0/$0 met, co-insurance 0%.   TCR/ICR? ICR Visit(date of service)limitation? No Can multiple codes be used on the same date of service/visit?(IF ITS A LIMIT) N/A  Is this a lifetime maximum or an annual maximum? Annual Has the member used any of these services to date? No Is there a time limit (weeks/months) on start of program and/or program completion? No

## 2024-06-10 ENCOUNTER — Telehealth: Payer: Self-pay | Admitting: Cardiology

## 2024-06-10 NOTE — Telephone Encounter (Signed)
 Left message for patient to call back 06/10/24

## 2024-06-10 NOTE — Telephone Encounter (Signed)
 Wife called to say the patient start rehab 2/2 at 1:30. Wanted to make dr aware and to see if she should continue to monitor her bp.Please advise

## 2024-06-18 ENCOUNTER — Other Ambulatory Visit: Payer: Self-pay | Admitting: Cardiology

## 2024-06-26 ENCOUNTER — Telehealth (HOSPITAL_COMMUNITY): Payer: Self-pay

## 2024-06-26 NOTE — Telephone Encounter (Signed)
 Called to confirm appt for 10:30 am tomorrow for CRP2 orientation. L/M on V/M for return call to our department at [phone].  Alm Parkins MS, ACSM-CEP, CCRP

## 2024-06-27 ENCOUNTER — Encounter (HOSPITAL_COMMUNITY)
Admission: RE | Admit: 2024-06-27 | Discharge: 2024-06-27 | Disposition: A | Discharge status: Home / Self Care | Source: Ambulatory Visit | Attending: Cardiology | Admitting: Cardiology

## 2024-06-27 ENCOUNTER — Encounter (HOSPITAL_COMMUNITY): Payer: Self-pay

## 2024-06-27 VITALS — BP 106/70 | HR 82 | Ht 73.5 in | Wt 222.9 lb

## 2024-06-27 DIAGNOSIS — Z9889 Other specified postprocedural states: Secondary | ICD-10-CM | POA: Insufficient documentation

## 2024-06-27 HISTORY — DX: Essential (primary) hypertension: I10

## 2024-06-27 HISTORY — DX: Hyperlipidemia, unspecified: E78.5

## 2024-06-27 NOTE — Progress Notes (Signed)
 Cardiac Individual Treatment Plan  Patient Details  Name: Jonathan [name] MRN: [id number] Date of Birth: [birth year] Referring Provider:   Flowsheet Row INTENSIVE CARDIAC REHAB ORIENT from 06/27/2024 in [facility]  Referring Provider Dr. Lynwood Schilling MD    Initial Encounter Date:  Flowsheet Row INTENSIVE CARDIAC REHAB ORIENT from 06/27/2024 in [facility]  Date 06/27/24    Visit Diagnosis: S/P mitral valve repair, S/P maze procedure at [facility], Dr Lendia  Patient's Home Medications on Admission: Current Medications[1]  Past Medical History: Past Medical History:  Diagnosis Date   Anxiety    Arthritis    Avascular necrosis of bone (HCC)    left hip    Back pain, lumbosacral 2000   Blind right eye    Colon polyps 08/29/2011   Hyperplastic colon polyps   Hyperlipidemia    Hypertension    Mitral valve prolapse     Tobacco Use: Tobacco Use History[2]  Labs: Review Flowsheet  More data exists      Latest Ref Rng & Units 05/19/2020 11/13/2020 01/28/2021 11/29/2021 01/12/2024  Labs for ITP Cardiac and Pulmonary Rehab  Cholestrol 0 - 200 mg/dL 767  808  - 794  -  LDL (calc) 0 - 99 mg/dL 845  881  - 874  -  HDL-C >39.00 mg/dL 47.49  55  - 48.69  -  Trlycerides 0.0 - 149.0 mg/dL 870.9  97  - 856.9  -  Hemoglobin A1c 4.6 - 6.5 % 6.0  - 6.1  6.1  -  PH, Arterial 7.35 - 7.45 - - - - 7.344   PCO2 arterial 32 - 48 mmHg - - - - 37.8   Bicarbonate 20.0 - 28.0 mmol/L - - - - 20.6  23.3  23.9   TCO2 22 - 32 mmol/L - - - - 22  25  25    Acid-base deficit 0.0 - 2.0 mmol/L - - - - 5.0  3.0  3.0   O2 Saturation % - - - - 93  69  65     Details       Multiple values from one day are sorted in reverse-chronological order         Capillary Blood Glucose: No results found for: GLUCAP   Exercise Target Goals: Exercise Program Goal: Individual exercise  prescription set using results from initial 6 min walk test and THRR while considering  patients activity barriers and safety.   Exercise Prescription Goal: Initial exercise prescription builds to 30-45 minutes a day of aerobic activity, 2-3 days per week.  Home exercise guidelines will be given to patient during program as part of exercise prescription that the participant will acknowledge.  Activity Barriers & Risk Stratification:  Activity Barriers & Cardiac Risk Stratification - 06/27/24 1138       Activity Barriers & Cardiac Risk Stratification   Activity Barriers Back Problems;Left Hip Replacement;Right Hip Replacement;Neck/Spine Problems;Joint Problems;Deconditioning;Muscular Weakness;Balance Concerns    Cardiac Risk Stratification High          6 Minute Walk:  6 Minute Walk     Row Name 06/27/24 1356         6 Minute Walk   Phase Initial     Distance 909 feet     Walk Time 6 minutes     # of Rest Breaks 0     MPH 1.72  METS 2.2     RPE 9     Perceived Dyspnea  0     VO2 Peak 7.8     Symptoms No     Resting HR 82 bpm     Resting BP 106/70     Resting Oxygen Saturation  97 %     Exercise Oxygen Saturation  during 6 min walk 97 %     Max Ex. HR 112 bpm     Max Ex. BP 120/80     2 Minute Post BP 118/80        Oxygen Initial Assessment:   Oxygen Re-Evaluation:   Oxygen Discharge (Final Oxygen Re-Evaluation):   Initial Exercise Prescription:  Initial Exercise Prescription - 06/27/24 1000       Date of Initial Exercise RX and Referring Provider   Date 06/27/24    Referring Provider Dr. Lynwood Schilling MD    Expected Discharge Date 09/18/24      Arm Ergometer   Level 1.5    Watts 38    RPM 60    Minutes 15    METs 2.2      T5 Nustep   Level 1    SPM 75    Minutes 30    METs 2.2      Prescription Details   Frequency (times per week) 3    Duration Progress to 30 minutes of continuous aerobic without signs/symptoms of physical distress       Intensity   THRR 40-80% of Max Heartrate 60-120    Ratings of Perceived Exertion 11-13    Perceived Dyspnea 0-4      Progression   Progression Continue progressive overload as per policy without signs/symptoms or physical distress.      Resistance Training   Training Prescription Yes    Weight 5    Reps 10-15          Perform Capillary Blood Glucose checks as needed.  Exercise Prescription Changes:   Exercise Comments:   Exercise Goals and Review:   Exercise Goals     Row Name 06/27/24 1056             Exercise Goals   Increase Physical Activity Yes       Intervention Provide advice, education, support and counseling about physical activity/exercise needs.;Develop an individualized exercise prescription for aerobic and resistive training based on initial evaluation findings, risk stratification, comorbidities and participant's personal goals.       Expected Outcomes Short Term: Attend rehab on a regular basis to increase amount of physical activity.;Long Term: Exercising regularly at least 3-5 days a week.;Long Term: Add in home exercise to make exercise part of routine and to increase amount of physical activity.       Increase Strength and Stamina Yes       Intervention Provide advice, education, support and counseling about physical activity/exercise needs.;Develop an individualized exercise prescription for aerobic and resistive training based on initial evaluation findings, risk stratification, comorbidities and participant's personal goals.       Expected Outcomes Short Term: Increase workloads from initial exercise prescription for resistance, speed, and METs.;Short Term: Perform resistance training exercises routinely during rehab and add in resistance training at home;Long Term: Improve cardiorespiratory fitness, muscular endurance and strength as measured by increased METs and functional capacity ( )       Able to understand and use rate of perceived exertion  (RPE) scale Yes       Intervention Provide education and explanation  on how to use RPE scale       Expected Outcomes Short Term: Able to use RPE daily in rehab to express subjective intensity level;Long Term:  Able to use RPE to guide intensity level when exercising independently       Knowledge and understanding of Target Heart Rate Range (THRR) Yes       Intervention Provide education and explanation of THRR including how the numbers were predicted and where they are located for reference       Expected Outcomes Short Term: Able to state/look up THRR;Long Term: Able to use THRR to govern intensity when exercising independently;Short Term: Able to use daily as guideline for intensity in rehab       Understanding of Exercise Prescription Yes       Intervention Provide education, explanation, and written materials on patient's individual exercise prescription       Expected Outcomes Short Term: Able to explain program exercise prescription;Long Term: Able to explain home exercise prescription to exercise independently          Exercise Goals Re-Evaluation :   Discharge Exercise Prescription (Final Exercise Prescription Changes):   Nutrition:  Target Goals: Understanding of nutrition guidelines, daily intake of sodium 1500mg , cholesterol 200mg , calories 30% from fat and 7% or less from saturated fats, daily to have 5 or more servings of fruits and vegetables.  Biometrics:  Pre Biometrics - 06/27/24 1120       Pre Biometrics   Waist Circumference 43.5 inches    Hip Circumference 44 inches    Waist to Hip Ratio 0.99 %    Triceps Skinfold 15 mm    % Body Fat 29.2 %    Grip Strength 40 kg    Flexibility --   Not perfromed due to back issues   Single Leg Stand 4 seconds           Nutrition Therapy Plan and Nutrition Goals:   Nutrition Assessments:  MEDIFICTS Score Key: >=70 Need to make dietary changes  40-70 Heart Healthy Diet <= 40 Therapeutic Level Cholesterol Diet     Picture Your Plate Scores: <59 Unhealthy dietary pattern with much room for improvement. 41-50 Dietary pattern unlikely to meet recommendations for good health and room for improvement. 51-60 More healthful dietary pattern, with some room for improvement.  >60 Healthy dietary pattern, although there may be some specific behaviors that could be improved.    Nutrition Goals Re-Evaluation:   Nutrition Goals Re-Evaluation:   Nutrition Goals Discharge (Final Nutrition Goals Re-Evaluation):   Psychosocial: Target Goals: Acknowledge presence or absence of significant depression and/or stress, maximize coping skills, provide positive support system. Participant is able to verbalize types and ability to use techniques and skills needed for reducing stress and depression.  Initial Review & Psychosocial Screening:  Initial Psych Review & Screening - 06/27/24 1142       Initial Review   Current issues with Current Sleep Concerns      Family Dynamics   Good Support System? Yes   Pt has wife and son     Barriers   Psychosocial barriers to participate in program There are no identifiable barriers or psychosocial needs.      Screening Interventions   Interventions Encouraged to exercise          Quality of Life Scores:  Quality of Life - 06/27/24 1355       Quality of Life   Select Quality of Life      Quality  of Life Scores   Health/Function Pre 27.43 %    Socioeconomic Pre 25.83 %    Psych/Spiritual Pre 30 %    Family Pre 30 %    GLOBAL Pre 28.09 %         Scores of 19 and below usually indicate a poorer quality of life in these areas.  A difference of  2-3 points is a clinically meaningful difference.  A difference of 2-3 points in the total score of the Quality of Life Index has been associated with significant improvement in overall quality of life, self-image, physical symptoms, and general health in studies assessing change in quality of life.  PHQ-9: Review  Flowsheet  More data exists      06/27/2024 06/01/2021 05/22/2020 05/19/2020 12/25/2018  Depression screen PHQ 2/9  Decreased Interest 0 0 0 0 0  Down, Depressed, Hopeless 0 0 0 0 0  PHQ - 2 Score 0 0 0 0 0  Altered sleeping 1 - - - -  Tired, decreased energy 0 - - - -  Change in appetite 1 - - - -  Feeling bad or failure about yourself  0 - - - -  Trouble concentrating 0 - - - -  Moving slowly or fidgety/restless 0 - - - -  Suicidal thoughts 0 - - - -  PHQ-9 Score 2 - - - -  Difficult doing work/chores Not difficult at all - - - -   Interpretation of Total Score  Total Score Depression Severity:  1-4 = Minimal depression, 5-9 = Mild depression, 10-14 = Moderate depression, 15-19 = Moderately severe depression, 20-27 = Severe depression   Psychosocial Evaluation and Intervention:   Psychosocial Re-Evaluation:   Psychosocial Discharge (Final Psychosocial Re-Evaluation):   Vocational Rehabilitation: Provide vocational rehab assistance to qualifying candidates.   Vocational Rehab Evaluation & Intervention:  Vocational Rehab - 06/27/24 1144       Initial Vocational Rehab Evaluation & Intervention   Assessment shows need for Vocational Rehabilitation No   Pt is retired         Education: Education Goals: Education classes will be provided on a weekly basis, covering required topics. Participant will state understanding/return demonstration of topics presented.     Core Videos: Exercise    Move It!  Clinical staff conducted group or individual video education with verbal and written material and guidebook.  Patient learns the recommended Pritikin exercise program. Exercise with the goal of living a long, healthy life. Some of the health benefits of exercise include controlled diabetes, healthier blood pressure levels, improved cholesterol levels, improved heart and lung capacity, improved sleep, and better body composition. Everyone should speak with their doctor before  starting or changing an exercise routine.  Biomechanical Limitations Clinical staff conducted group or individual video education with verbal and written material and guidebook.  Patient learns how biomechanical limitations can impact exercise and how we can mitigate and possibly overcome limitations to have an impactful and balanced exercise routine.  Body Composition Clinical staff conducted group or individual video education with verbal and written material and guidebook.  Patient learns that body composition (ratio of muscle mass to fat mass) is a key component to assessing overall fitness, rather than body weight alone. Increased fat mass, especially visceral belly fat, can put us  at increased risk for metabolic syndrome, type 2 diabetes, heart disease, and even death. It is recommended to combine diet and exercise (cardiovascular and resistance training) to improve your body composition. Seek guidance from  your physician and exercise physiologist before implementing an exercise routine.  Exercise Action Plan Clinical staff conducted group or individual video education with verbal and written material and guidebook.  Patient learns the recommended strategies to achieve and enjoy long-term exercise adherence, including variety, self-motivation, self-efficacy, and positive decision making. Benefits of exercise include fitness, good health, weight management, more energy, better sleep, less stress, and overall well-being.  Medical   Heart Disease Risk Reduction Clinical staff conducted group or individual video education with verbal and written material and guidebook.  Patient learns our heart is our most vital organ as it circulates oxygen, nutrients, white blood cells, and hormones throughout the entire body, and carries waste away. Data supports a plant-based eating plan like the Pritikin Program for its effectiveness in slowing progression of and reversing heart disease. The video provides a  number of recommendations to address heart disease.   Metabolic Syndrome and Belly Fat  Clinical staff conducted group or individual video education with verbal and written material and guidebook.  Patient learns what metabolic syndrome is, how it leads to heart disease, and how one can reverse it and keep it from coming back. You have metabolic syndrome if you have 3 of the following 5 criteria: abdominal obesity, high blood pressure, high triglycerides, low HDL cholesterol, and high blood sugar.  Hypertension and Heart Disease Clinical staff conducted group or individual video education with verbal and written material and guidebook.  Patient learns that high blood pressure, or hypertension, is very common in the [place] . Hypertension is largely due to excessive salt intake, but other important risk factors include being overweight, physical inactivity, drinking too much alcohol , smoking, and not eating enough potassium from fruits and vegetables. High blood pressure is a leading risk factor for heart attack, stroke, congestive heart failure, dementia, kidney failure, and premature death. Long-term effects of excessive salt intake include stiffening of the arteries and thickening of heart muscle and organ damage. Recommendations include ways to reduce hypertension and the risk of heart disease.  Diseases of Our Time - Focusing on Diabetes Clinical staff conducted group or individual video education with verbal and written material and guidebook.  Patient learns why the best way to stop diseases of our time is prevention, through food and other lifestyle changes. Medicine (such as prescription pills and surgeries) is often only a Band-Aid on the problem, not a long-term solution. Most common diseases of our time include obesity, type 2 diabetes, hypertension, heart disease, and cancer. The Pritikin Program is recommended and has been proven to help reduce, reverse, and/or prevent the damaging  effects of metabolic syndrome.  Nutrition   Overview of the Pritikin Eating Plan  Clinical staff conducted group or individual video education with verbal and written material and guidebook.  Patient learns about the Pritikin Eating Plan for disease risk reduction. The Pritikin Eating Plan emphasizes a wide variety of unrefined, minimally-processed carbohydrates, like fruits, vegetables, whole grains, and legumes. Go, Caution, and Stop food choices are explained. Plant-based and lean animal proteins are emphasized. Rationale provided for low sodium intake for blood pressure control, low added sugars for blood sugar stabilization, and low added fats and oils for coronary artery disease risk reduction and weight management.  Calorie Density  Clinical staff conducted group or individual video education with verbal and written material and guidebook.  Patient learns about calorie density and how it impacts the Pritikin Eating Plan. Knowing the characteristics of the food you choose will help you decide whether those  foods will lead to weight gain or weight loss, and whether you want to consume more or less of them. Weight loss is usually a side effect of the Pritikin Eating Plan because of its focus on low calorie-dense foods.  Label Reading  Clinical staff conducted group or individual video education with verbal and written material and guidebook.  Patient learns about the Pritikin recommended label reading guidelines and corresponding recommendations regarding calorie density, added sugars, sodium content, and whole grains.  Dining Out - Part 1  Clinical staff conducted group or individual video education with verbal and written material and guidebook.  Patient learns that restaurant meals can be sabotaging because they can be so high in calories, fat, sodium, and/or sugar. Patient learns recommended strategies on how to positively address this and avoid unhealthy pitfalls.  Facts on Fats   Clinical staff conducted group or individual video education with verbal and written material and guidebook.  Patient learns that lifestyle modifications can be just as effective, if not more so, as many medications for lowering your risk of heart disease. A Pritikin lifestyle can help to reduce your risk of inflammation and atherosclerosis (cholesterol build-up, or plaque, in the artery walls). Lifestyle interventions such as dietary choices and physical activity address the cause of atherosclerosis. A review of the types of fats and their impact on blood cholesterol levels, along with dietary recommendations to reduce fat intake is also included.  Nutrition Action Plan  Clinical staff conducted group or individual video education with verbal and written material and guidebook.  Patient learns how to incorporate Pritikin recommendations into their lifestyle. Recommendations include planning and keeping personal health goals in mind as an important part of their success.  Healthy Mind-Set    Healthy Minds, Bodies, Hearts  Clinical staff conducted group or individual video education with verbal and written material and guidebook.  Patient learns how to identify when they are stressed. Video will discuss the impact of that stress, as well as the many benefits of stress management. Patient will also be introduced to stress management techniques. The way we think, act, and feel has an impact on our hearts.  How Our Thoughts Can Heal Our Hearts  Clinical staff conducted group or individual video education with verbal and written material and guidebook.  Patient learns that negative thoughts can cause depression and anxiety. This can result in negative lifestyle behavior and serious health problems. Cognitive behavioral therapy is an effective method to help control our thoughts in order to change and improve our emotional outlook.  Additional Videos:  Exercise    Improving Performance  Clinical  staff conducted group or individual video education with verbal and written material and guidebook.  Patient learns to use a non-linear approach by alternating intensity levels and lengths of time spent exercising to help burn more calories and lose more body fat. Cardiovascular exercise helps improve heart health, metabolism, hormonal balance, blood sugar control, and recovery from fatigue. Resistance training improves strength, endurance, balance, coordination, reaction time, metabolism, and muscle mass. Flexibility exercise improves circulation, posture, and balance. Seek guidance from your physician and exercise physiologist before implementing an exercise routine and learn your capabilities and proper form for all exercise.  Introduction to Yoga  Clinical staff conducted group or individual video education with verbal and written material and guidebook.  Patient learns about yoga, a discipline of the coming together of mind, breath, and body. The benefits of yoga include improved flexibility, improved range of motion, better posture and core  strength, increased lung function, weight loss, and positive self-image. Yogas heart health benefits include lowered blood pressure, healthier heart rate, decreased cholesterol and triglyceride levels, improved immune function, and reduced stress. Seek guidance from your physician and exercise physiologist before implementing an exercise routine and learn your capabilities and proper form for all exercise.  Medical   Aging: Enhancing Your Quality of Life  Clinical staff conducted group or individual video education with verbal and written material and guidebook.  Patient learns key strategies and recommendations to stay in good physical health and enhance quality of life, such as prevention strategies, having an advocate, securing a Health Care Proxy and Power of Attorney, and keeping a list of medications and system for tracking them. It also discusses how to  avoid risk for bone loss.  Biology of Weight Control  Clinical staff conducted group or individual video education with verbal and written material and guidebook.  Patient learns that weight gain occurs because we consume more calories than we burn (eating more, moving less). Even if your body weight is normal, you may have higher ratios of fat compared to muscle mass. Too much body fat puts you at increased risk for cardiovascular disease, heart attack, stroke, type 2 diabetes, and obesity-related cancers. In addition to exercise, following the Pritikin Eating Plan can help reduce your risk.  Decoding Lab Results  Clinical staff conducted group or individual video education with verbal and written material and guidebook.  Patient learns that lab test reflects one measurement whose values change over time and are influenced by many factors, including medication, stress, sleep, exercise, food, hydration, pre-existing medical conditions, and more. It is recommended to use the knowledge from this video to become more involved with your lab results and evaluate your numbers to speak with your doctor.   Diseases of Our Time - Overview  Clinical staff conducted group or individual video education with verbal and written material and guidebook.  Patient learns that according to the CDC, 50% to 70% of chronic diseases (such as obesity, type 2 diabetes, elevated lipids, hypertension, and heart disease) are avoidable through lifestyle improvements including healthier food choices, listening to satiety cues, and increased physical activity.  Sleep Disorders Clinical staff conducted group or individual video education with verbal and written material and guidebook.  Patient learns how good quality and duration of sleep are important to overall health and well-being. Patient also learns about sleep disorders and how they impact health along with recommendations to address them, including discussing with a  physician.  Nutrition  Dining Out - Part 2 Clinical staff conducted group or individual video education with verbal and written material and guidebook.  Patient learns how to plan ahead and communicate in order to maximize their dining experience in a healthy and nutritious manner. Included are recommended food choices based on the type of restaurant the patient is visiting.   Fueling a Banker conducted group or individual video education with verbal and written material and guidebook.  There is a strong connection between our food choices and our health. Diseases like obesity and type 2 diabetes are very prevalent and are in large-part due to lifestyle choices. The Pritikin Eating Plan provides plenty of food and hunger-curbing satisfaction. It is easy to follow, affordable, and helps reduce health risks.  Menu Workshop  Clinical staff conducted group or individual video education with verbal and written material and guidebook.  Patient learns that restaurant meals can sabotage health goals because they are  often packed with calories, fat, sodium, and sugar. Recommendations include strategies to plan ahead and to communicate with the manager, chef, or server to help order a healthier meal.  Planning Your Eating Strategy  Clinical staff conducted group or individual video education with verbal and written material and guidebook.  Patient learns about the Pritikin Eating Plan and its benefit of reducing the risk of disease. The Pritikin Eating Plan does not focus on calories. Instead, it emphasizes high-quality, nutrient-rich foods. By knowing the characteristics of the foods, we choose, we can determine their calorie density and make informed decisions.  Targeting Your Nutrition Priorities  Clinical staff conducted group or individual video education with verbal and written material and guidebook.  Patient learns that lifestyle habits have a tremendous impact on disease  risk and progression. This video provides eating and physical activity recommendations based on your personal health goals, such as reducing LDL cholesterol, losing weight, preventing or controlling type 2 diabetes, and reducing high blood pressure.  Vitamins and Minerals  Clinical staff conducted group or individual video education with verbal and written material and guidebook.  Patient learns different ways to obtain key vitamins and minerals, including through a recommended healthy diet. It is important to discuss all supplements you take with your doctor.   Healthy Mind-Set    Smoking Cessation  Clinical staff conducted group or individual video education with verbal and written material and guidebook.  Patient learns that cigarette smoking and tobacco addiction pose a serious health risk which affects millions of people. Stopping smoking will significantly reduce the risk of heart disease, lung disease, and many forms of cancer. Recommended strategies for quitting are covered, including working with your doctor to develop a successful plan.  Culinary   Becoming a Set Designer conducted group or individual video education with verbal and written material and guidebook.  Patient learns that cooking at home can be healthy, cost-effective, quick, and puts them in control. Keys to cooking healthy recipes will include looking at your recipe, assessing your equipment needs, planning ahead, making it simple, choosing cost-effective seasonal ingredients, and limiting the use of added fats, salts, and sugars.  Cooking - Breakfast and Snacks  Clinical staff conducted group or individual video education with verbal and written material and guidebook.  Patient learns how important breakfast is to satiety and nutrition through the entire day. Recommendations include key foods to eat during breakfast to help stabilize blood sugar levels and to prevent overeating at meals later in the day.  Planning ahead is also a key component.  Cooking - Educational Psychologist conducted group or individual video education with verbal and written material and guidebook.  Patient learns eating strategies to improve overall health, including an approach to cook more at home. Recommendations include thinking of animal protein as a side on your plate rather than center stage and focusing instead on lower calorie dense options like vegetables, fruits, whole grains, and plant-based proteins, such as beans. Making sauces in large quantities to freeze for later and leaving the skin on your vegetables are also recommended to maximize your experience.  Cooking - Healthy Salads and Dressing Clinical staff conducted group or individual video education with verbal and written material and guidebook.  Patient learns that vegetables, fruits, whole grains, and legumes are the foundations of the Pritikin Eating Plan. Recommendations include how to incorporate each of these in flavorful and healthy salads, and how to create homemade salad dressings. Proper handling of  ingredients is also covered. Cooking - Soups and [employer] - Soups and Desserts Clinical staff conducted group or individual video education with verbal and written material and guidebook.  Patient learns that Pritikin soups and desserts make for easy, nutritious, and delicious snacks and meal components that are low in sodium, fat, sugar, and calorie density, while high in vitamins, minerals, and filling fiber. Recommendations include simple and healthy ideas for soups and desserts.   Overview     The Pritikin Solution Program Overview Clinical staff conducted group or individual video education with verbal and written material and guidebook.  Patient learns that the results of the Pritikin Program have been documented in more than 100 articles published in peer-reviewed journals, and the benefits include reducing risk factors for  (and, in some cases, even reversing) high cholesterol, high blood pressure, type 2 diabetes, obesity, and more! An overview of the three key pillars of the Pritikin Program will be covered: eating well, doing regular exercise, and having a healthy mind-set.  WORKSHOPS  Exercise: Exercise Basics: Building Your Action Plan Clinical staff led group instruction and group discussion with PowerPoint presentation and patient guidebook. To enhance the learning environment the use of posters, models and videos may be added. At the conclusion of this workshop, patients will comprehend the difference between physical activity and exercise, as well as the benefits of incorporating both, into their routine. Patients will understand the FITT (Frequency, Intensity, Time, and Type) principle and how to use it to build an exercise action plan. In addition, safety concerns and other considerations for exercise and cardiac rehab will be addressed by the presenter. The purpose of this lesson is to promote a comprehensive and effective weekly exercise routine in order to improve patients overall level of fitness.   Managing Heart Disease: Your Path to a Healthier Heart Clinical staff led group instruction and group discussion with PowerPoint presentation and patient guidebook. To enhance the learning environment the use of posters, models and videos may be added.At the conclusion of this workshop, patients will understand the anatomy and physiology of the heart. Additionally, they will understand how Pritikins three pillars impact the risk factors, the progression, and the management of heart disease.  The purpose of this lesson is to provide a high-level overview of the heart, heart disease, and how the Pritikin lifestyle positively impacts risk factors.  Exercise Biomechanics Clinical staff led group instruction and group discussion with PowerPoint presentation and patient guidebook. To enhance the learning  environment the use of posters, models and videos may be added. Patients will learn how the structural parts of their bodies function and how these functions impact their daily activities, movement, and exercise. Patients will learn how to promote a neutral spine, learn how to manage pain, and identify ways to improve their physical movement in order to promote healthy living. The purpose of this lesson is to expose patients to common physical limitations that impact physical activity. Participants will learn practical ways to adapt and manage aches and pains, and to minimize their effect on regular exercise. Patients will learn how to maintain good posture while sitting, walking, and lifting.  Balance Training and Fall Prevention  Clinical staff led group instruction and group discussion with PowerPoint presentation and patient guidebook. To enhance the learning environment the use of posters, models and videos may be added. At the conclusion of this workshop, patients will understand the importance of their sensorimotor skills (vision, proprioception, and the vestibular system) in maintaining their ability  to balance as they age. Patients will apply a variety of balancing exercises that are appropriate for their current level of function. Patients will understand the common causes for poor balance, possible solutions to these problems, and ways to modify their physical environment in order to minimize their fall risk. The purpose of this lesson is to teach patients about the importance of maintaining balance as they age and ways to minimize their risk of falling.  WORKSHOPS   Nutrition:  Fueling a Ship Broker led group instruction and group discussion with PowerPoint presentation and patient guidebook. To enhance the learning environment the use of posters, models and videos may be added. Patients will review the foundational principles of the Pritikin Eating Plan and understand  what constitutes a serving size in each of the food groups. Patients will also learn Pritikin-friendly foods that are better choices when away from home and review make-ahead meal and snack options. Calorie density will be reviewed and applied to three nutrition priorities: weight maintenance, weight loss, and weight gain. The purpose of this lesson is to reinforce (in a group setting) the key concepts around what patients are recommended to eat and how to apply these guidelines when away from home by planning and selecting Pritikin-friendly options. Patients will understand how calorie density may be adjusted for different weight management goals.  Mindful Eating  Clinical staff led group instruction and group discussion with PowerPoint presentation and patient guidebook. To enhance the learning environment the use of posters, models and videos may be added. Patients will briefly review the concepts of the Pritikin Eating Plan and the importance of low-calorie dense foods. The concept of mindful eating will be introduced as well as the importance of paying attention to internal hunger signals. Triggers for non-hunger eating and techniques for dealing with triggers will be explored. The purpose of this lesson is to provide patients with the opportunity to review the basic principles of the Pritikin Eating Plan, discuss the value of eating mindfully and how to measure internal cues of hunger and fullness using the Hunger Scale. Patients will also discuss reasons for non-hunger eating and learn strategies to use for controlling emotional eating.  Targeting Your Nutrition Priorities Clinical staff led group instruction and group discussion with PowerPoint presentation and patient guidebook. To enhance the learning environment the use of posters, models and videos may be added. Patients will learn how to determine their genetic susceptibility to disease by reviewing their family history. Patients will gain  insight into the importance of diet as part of an overall healthy lifestyle in mitigating the impact of genetics and other environmental insults. The purpose of this lesson is to provide patients with the opportunity to assess their personal nutrition priorities by looking at their family history, their own health history and current risk factors. Patients will also be able to discuss ways of prioritizing and modifying the Pritikin Eating Plan for their highest risk areas  Menu  Clinical staff led group instruction and group discussion with PowerPoint presentation and patient guidebook. To enhance the learning environment the use of posters, models and videos may be added. Using menus brought in from [employer], or printed from [employer], patients will apply the Pritikin dining out guidelines that were presented in the [employer] video. Patients will also be able to practice these guidelines in a variety of provided scenarios. The purpose of this lesson is to provide patients with the opportunity to practice hands-on learning of the Pritikin  Dining Out guidelines with actual menus and practice scenarios.  Label Reading Clinical staff led group instruction and group discussion with PowerPoint presentation and patient guidebook. To enhance the learning environment the use of posters, models and videos may be added. Patients will review and discuss the Pritikin label reading guidelines presented in Pritikins Label Reading Educational series video. Using fool labels brought in from local grocery stores and markets, patients will apply the label reading guidelines and determine if the packaged food meet the Pritikin guidelines. The purpose of this lesson is to provide patients with the opportunity to review, discuss, and practice hands-on learning of the Pritikin Label Reading guidelines with actual packaged food labels. Cooking School  Pritikins [employer] are  designed to teach patients ways to prepare quick, simple, and affordable recipes at home. The importance of nutritions role in chronic disease risk reduction is reflected in its emphasis in the overall Pritikin program. By learning how to prepare essential core Pritikin Eating Plan recipes, patients will increase control over what they eat; be able to customize the flavor of foods without the use of added salt, sugar, or fat; and improve the quality of the food they consume. By learning a set of core recipes which are easily assembled, quickly prepared, and affordable, patients are more likely to prepare more healthy foods at home. These workshops focus on convenient breakfasts, simple entres, side dishes, and desserts which can be prepared with minimal effort and are consistent with nutrition recommendations for cardiovascular risk reduction. Cooking [employer] are taught by a armed forces logistics/support/administrative officer (RD) who has been trained by the [employer]. The chef or RD has a clear understanding of the importance of minimizing - if not completely eliminating - added fat, sugar, and sodium in recipes. Throughout the series of Cooking School Workshop sessions, patients will learn about healthy ingredients and efficient methods of cooking to build confidence in their capability to prepare    Cooking School weekly topics:  Adding Flavor- Sodium-Free  Fast and Healthy Breakfasts  Powerhouse Plant-Based Proteins  Satisfying Salads and Dressings  Simple Sides and Sauces  International Cuisine-Spotlight on the [employer] Zones  Delicious Desserts  Savory Soups  [employer] - Meals in a Astronomer Appetizers and Snacks  Comforting Weekend Breakfasts  One-Pot Wonders   Fast Evening Meals  Landscape Architect Your Pritikin Plate  WORKSHOPS   Healthy Mindset (Psychosocial):  Focused Goals, Sustainable Changes Clinical staff led group instruction and group discussion  with PowerPoint presentation and patient guidebook. To enhance the learning environment the use of posters, models and videos may be added. Patients will be able to apply effective goal setting strategies to establish at least one personal goal, and then take consistent, meaningful action toward that goal. They will learn to identify common barriers to achieving personal goals and develop strategies to overcome them. Patients will also gain an understanding of how our mind-set can impact our ability to achieve goals and the importance of cultivating a positive and growth-oriented mind-set. The purpose of this lesson is to provide patients with a deeper understanding of how to set and achieve personal goals, as well as the tools and strategies needed to overcome common obstacles which may arise along the way.  From Head to Heart: The Power of a Healthy Outlook  Clinical staff led group instruction and group discussion with PowerPoint presentation and patient guidebook. To enhance the learning environment the use of posters, models and videos  may be added. Patients will be able to recognize and describe the impact of emotions and mood on physical health. They will discover the importance of self-care and explore self-care practices which may work for them. Patients will also learn how to utilize the 4 Cs to cultivate a healthier outlook and better manage stress and challenges. The purpose of this lesson is to demonstrate to patients how a healthy outlook is an essential part of maintaining good health, especially as they continue their cardiac rehab journey.  Healthy Sleep for a Healthy Heart Clinical staff led group instruction and group discussion with PowerPoint presentation and patient guidebook. To enhance the learning environment the use of posters, models and videos may be added. At the conclusion of this workshop, patients will be able to demonstrate knowledge of the importance of sleep to overall  health, well-being, and quality of life. They will understand the symptoms of, and treatments for, common sleep disorders. Patients will also be able to identify daytime and nighttime behaviors which impact sleep, and they will be able to apply these tools to help manage sleep-related challenges. The purpose of this lesson is to provide patients with a general overview of sleep and outline the importance of quality sleep. Patients will learn about a few of the most common sleep disorders. Patients will also be introduced to the concept of sleep hygiene, and discover ways to self-manage certain sleeping problems through simple daily behavior changes. Finally, the workshop will motivate patients by clarifying the links between quality sleep and their goals of heart-healthy living.   Recognizing and Reducing Stress Clinical staff led group instruction and group discussion with PowerPoint presentation and patient guidebook. To enhance the learning environment the use of posters, models and videos may be added. At the conclusion of this workshop, patients will be able to understand the types of stress reactions, differentiate between acute and chronic stress, and recognize the impact that chronic stress has on their health. They will also be able to apply different coping mechanisms, such as reframing negative self-talk. Patients will have the opportunity to practice a variety of stress management techniques, such as deep abdominal breathing, progressive muscle relaxation, and/or guided imagery.  The purpose of this lesson is to educate patients on the role of stress in their lives and to provide healthy techniques for coping with it.  Learning Barriers/Preferences:  Learning Barriers/Preferences - 06/27/24 1143       Learning Barriers/Preferences   Learning Barriers Sight    Learning Preferences Computer/Internet;Video;Pictoral          Education Topics:  Knowledge Questionnaire Score:  Knowledge  Questionnaire Score - 06/27/24 1148       Knowledge Questionnaire Score   Pre Score 27/28          Core Components/Risk Factors/Patient Goals at Admission:  Personal Goals and Risk Factors at Admission - 06/27/24 1056       Core Components/Risk Factors/Patient Goals on Admission    Weight Management Yes;Weight Maintenance    Intervention Weight Management: Develop a combined nutrition and exercise program designed to reach desired caloric intake, while maintaining appropriate intake of nutrient and fiber, sodium and fats, and appropriate energy expenditure required for the weight goal.;Weight Management: Provide education and appropriate resources to help participant work on and attain dietary goals.;Obesity: Provide education and appropriate resources to help participant work on and attain dietary goals.    Expected Outcomes Weight Maintenance: Understanding of the daily nutrition guidelines, which includes 25-35% calories from fat, 7%  or less cal from saturated fats, less than 200mg  cholesterol, less than 1.5gm of sodium, & 5 or more servings of fruits and vegetables daily;Understanding recommendations for meals to include 15-35% energy as protein, 25-35% energy from fat, 35-60% energy from carbohydrates, less than 200mg  of dietary cholesterol, 20-35 gm of total fiber daily;Understanding of distribution of calorie intake throughout the day with the consumption of 4-5 meals/snacks    Tobacco Cessation Yes    Number of packs per day quit 02/1924    Intervention Assist the participant in steps to quit. Provide individualized education and counseling about committing to Tobacco Cessation, relapse prevention, and pharmacological support that can be provided by physician.;Education officer, environmental, assist with locating and accessing local/national Quit Smoking programs, and support quit date choice.    Expected Outcomes Long Term: Complete abstinence from all tobacco products for at least 12  months from quit date.    Hypertension Yes    Intervention Provide education on lifestyle modifcations including regular physical activity/exercise, weight management, moderate sodium restriction and increased consumption of fresh fruit, vegetables, and low fat dairy, alcohol  moderation, and smoking cessation.;Monitor prescription use compliance.    Expected Outcomes Short Term: Continued assessment and intervention until BP is < 140/36mm HG in hypertensive participants. < 130/44mm HG in hypertensive participants with diabetes, heart failure or chronic kidney disease.;Long Term: Maintenance of blood pressure at goal levels.    Lipids Yes    Intervention Provide education and support for participant on nutrition & aerobic/resistive exercise along with prescribed medications to achieve LDL 70mg , HDL >40mg .    Expected Outcomes Short Term: Participant states understanding of desired cholesterol values and is compliant with medications prescribed. Participant is following exercise prescription and nutrition guidelines.;Long Term: Cholesterol controlled with medications as prescribed, with individualized exercise RX and with personalized nutrition plan. Value goals: LDL < 70mg , HDL > 40 mg.    Personal Goal Other Yes    Intervention Will continue to monitor pt and progress workloads as tolerated without sign or symptom    Expected Outcomes Pt will achieve his goals          Core Components/Risk Factors/Patient Goals Review:    Core Components/Risk Factors/Patient Goals at Discharge (Final Review):    ITP Comments:  ITP Comments     Row Name 06/27/24 1055           ITP Comments Wilbert Holland, MD: Medical Director. Introduction to the Pritikin Education Program/Intensive Cardiac Rehab. Intial orientation packet reviewed with the patient.          Comments: Participant attended orientation for the cardiac rehabilitation program on  06/27/2024  to perform initial intake and exercise walk test.  Patient introduced to the Pritikin Program education and orientation packet was reviewed. Completed 6-minute walk test, measurements, initial ITP, and exercise prescription. Vital signs stable. Telemetry-normal sinus rhythm, occasional PVC's asymptomatic. Hadassah Elpidio Quan RN BSN   Service time was from 1030 to 1255.        [1]  Current Outpatient Medications:    amLODipine  (NORVASC ) 10 MG tablet, Take 10 mg by mouth daily., Disp: , Rfl:    amoxicillin  (AMOXIL ) 500 MG tablet, TAKE 4 TABLETS (2000 MG) 30-60 MINUTES PRIOR TO DENTAL CLEANING FOR ONE DOSE, Disp: 4 tablet, Rfl: 0   apixaban  (ELIQUIS ) 5 MG TABS tablet, Take 1 tablet (5 mg total) by mouth 2 (two) times daily., Disp: , Rfl:    Cholecalciferol (VITAMIN D3) 1000 units CAPS, Take 1,000 Units by mouth daily., Disp: ,  Rfl:    dexlansoprazole  (DEXILANT ) 60 MG capsule, Take 1 capsule (60 mg total) by mouth daily., Disp: 90 capsule, Rfl: 3   dutasteride  (AVODART ) 0.5 MG capsule, Take 0.5 mg by mouth daily., Disp: , Rfl:    ezetimibe  (ZETIA ) 10 MG tablet, Take 10 mg by mouth daily., Disp: , Rfl:    HYDROcodone -acetaminophen  (NORCO) 10-325 MG tablet, Take 1 tablet by mouth every 6 (six) hours as needed for moderate pain., Disp: 90 tablet, Rfl: 0   ipratropium (ATROVENT) 0.06 % nasal spray, Place 1-2 sprays into both nostrils daily as needed for rhinitis (allergies)., Disp: , Rfl:    levocetirizine (XYZAL ) 5 MG tablet, Take 5 mg by mouth daily as needed for allergies., Disp: , Rfl:    montelukast (SINGULAIR) 10 MG tablet, Take 10 mg by mouth daily as needed (allergies)., Disp: , Rfl:    naloxone  (NARCAN ) nasal spray 4 mg/0.1 mL, Place 1 spray into the nose as needed (opioid overdose)., Disp: 2 each, Rfl: 2   dutasteride  (AVODART ) 0.5 MG capsule, Take 1 capsule (0.5 mg total) by mouth daily., Disp: , Rfl:    pravastatin  (PRAVACHOL ) 40 MG tablet, Take 40 mg by mouth at bedtime. (Patient not taking: Reported on 06/27/2024), Disp: , Rfl:  [2]   Social History Tobacco Use  Smoking Status Former   Current packs/day: 0.10   Average packs/day: 0.1 packs/day for 30.0 years (3.0 ttl pk-yrs)   Types: Cigarettes, Cigars  Smokeless Tobacco Never  Tobacco Comments   Former smoker 03/10/23

## 2024-06-27 NOTE — Progress Notes (Signed)
 Cardiac Rehab Medication Review   Does the patient  feel that his/her medications are working for him/her?  yes  Has the patient been experiencing any side effects to the medications prescribed?  no  Does the patient measure his/her own blood pressure or blood glucose at home?  yes   Does the patient have any problems obtaining medications due to transportation or finances?   no  Understanding of regimen: good Understanding of indications: good Potential of compliance: good    Comments: Checks blood pressure daily at home.    Alm Parkins 06/27/2024 11:20 AM

## 2024-07-01 ENCOUNTER — Encounter (HOSPITAL_COMMUNITY)

## 2024-07-03 ENCOUNTER — Encounter (HOSPITAL_COMMUNITY)

## 2024-07-03 ENCOUNTER — Telehealth (HOSPITAL_COMMUNITY): Payer: Self-pay

## 2024-07-03 NOTE — Telephone Encounter (Signed)
 Patient c/o for 1:45 CR class due to icy roads.

## 2024-07-05 ENCOUNTER — Encounter (HOSPITAL_COMMUNITY): Admission: RE | Admit: 2024-07-05 | Source: Ambulatory Visit

## 2024-07-05 DIAGNOSIS — Z9889 Other specified postprocedural states: Secondary | ICD-10-CM

## 2024-07-05 NOTE — Progress Notes (Signed)
 Daily Session Note  Patient Details  Name: Jonathan [name] MRN: [id number] Date of Birth: 1955/06/23 Referring Provider:   Flowsheet Row INTENSIVE CARDIAC REHAB ORIENT from 06/27/2024 in [facility]  Referring Provider Dr. Lynwood Schilling MD    Encounter Date: 07/05/2024  Check In:  Session Check In - 07/05/24 1452       Check-In   Supervising physician immediately available to respond to emergencies [facility] MD immediately available    Physician(s) Orren Fabry, PA-C    Location [facility]    Staff Present Alm Parkins, MS, ACSM-CEP, CCRP, Exercise Physiologist;Sherree Shankman, RN, BSN;Other;Joseph Lennon, RN, Avonne Gal, MS, ACSM-CEP, Exercise Physiologist    Virtual Visit No    Medication changes reported     No    Fall or balance concerns reported    No    Warm-up and Cool-down Performed as group-led instruction   Cardiac orientation   Resistance Training Performed Yes    VAD Patient? No    PAD/SET Patient? No      Pain Assessment   Currently in Pain? No/denies    Pain Score 0-No pain    Multiple Pain Sites No          Capillary Blood Glucose: No results found for this or any previous visit (from the past 24 hours).   Exercise Prescription Changes - 07/05/24 1444       Response to Exercise   Blood Pressure (Admit) 114/70    Blood Pressure (Exercise) 124/84    Blood Pressure (Exit) 112/70    Heart Rate (Admit) 96 bpm    Heart Rate (Exercise) 109 bpm    Heart Rate (Exit) 96 bpm    Rating of Perceived Exertion (Exercise) 11    Symptoms None    Comments Off to a good start with exercise. NuStep MET average not obtained.    Duration Continue with 30 min of aerobic exercise without signs/symptoms of physical distress.    Intensity THRR unchanged      Progression   Progression Continue to progress workloads to maintain intensity without signs/symptoms of physical distress.     Average METs 1.6      Resistance Training   Training Prescription Yes    Weight 5 lbs    Reps 10-15    Time 5 Minutes      Interval Training   Interval Training No      Arm Ergometer   Level 1.5    Watts 12    RPM 43    Minutes 15    METs 1.6      T5 Nustep   Level 1    Minutes 15          Tobacco Use History[1]  Goals Met:  Exercise tolerated well No report of concerns or symptoms today Strength training completed today  Goals Unmet:  Not Applicable  Comments   [facility] started cardiac rehab today.  Pt tolerated light exercise without difficulty. VSS, telemetry-Sinus Rhythm, asymptomatic.  Medication list reconciled. Pt denies barriers to medicaiton compliance.  PSYCHOSOCIAL ASSESSMENT:  PHQ-2. Pt exhibits positive coping skills, hopeful outlook with supportive family. No psychosocial needs identified at this time, no psychosocial interventions necessary.    Pt enjoys fishing, working on cards and spending time with his dog.   Pt oriented to exercise equipment and routine.    Understanding verbalized.Hadassah Elpidio Quan RN BSN    Dr. Wilbert Bihari is Medical  Director for Cardiac Rehab at [facility].    [1]  Social History Tobacco Use  Smoking Status Former   Current packs/day: 0.10   Average packs/day: 0.1 packs/day for 30.0 years (3.0 ttl pk-yrs)   Types: Cigarettes, Cigars  Smokeless Tobacco Never  Tobacco Comments   Former smoker 03/10/23

## 2024-07-08 ENCOUNTER — Encounter (HOSPITAL_COMMUNITY)

## 2024-07-10 ENCOUNTER — Encounter (HOSPITAL_COMMUNITY)

## 2024-07-12 ENCOUNTER — Encounter (HOSPITAL_COMMUNITY)

## 2024-07-15 ENCOUNTER — Encounter (HOSPITAL_COMMUNITY)

## 2024-07-17 ENCOUNTER — Encounter (HOSPITAL_COMMUNITY)

## 2024-07-19 ENCOUNTER — Encounter (HOSPITAL_COMMUNITY)

## 2024-07-22 ENCOUNTER — Encounter (HOSPITAL_COMMUNITY)

## 2024-07-24 ENCOUNTER — Encounter (HOSPITAL_COMMUNITY)

## 2024-07-26 ENCOUNTER — Encounter (HOSPITAL_COMMUNITY)

## 2024-07-29 ENCOUNTER — Encounter (HOSPITAL_COMMUNITY)

## 2024-07-31 ENCOUNTER — Encounter (HOSPITAL_COMMUNITY)

## 2024-08-02 ENCOUNTER — Encounter (HOSPITAL_COMMUNITY)

## 2024-08-05 ENCOUNTER — Encounter (HOSPITAL_COMMUNITY)

## 2024-08-07 ENCOUNTER — Encounter (HOSPITAL_COMMUNITY)

## 2024-08-09 ENCOUNTER — Encounter (HOSPITAL_COMMUNITY)

## 2024-08-12 ENCOUNTER — Encounter (HOSPITAL_COMMUNITY)

## 2024-08-14 ENCOUNTER — Encounter (HOSPITAL_COMMUNITY)

## 2024-08-16 ENCOUNTER — Encounter (HOSPITAL_COMMUNITY)

## 2024-08-19 ENCOUNTER — Encounter (HOSPITAL_COMMUNITY)

## 2024-08-21 ENCOUNTER — Encounter (HOSPITAL_COMMUNITY)

## 2024-08-23 ENCOUNTER — Encounter (HOSPITAL_COMMUNITY)

## 2024-08-26 ENCOUNTER — Encounter (HOSPITAL_COMMUNITY)

## 2024-08-28 ENCOUNTER — Encounter (HOSPITAL_COMMUNITY)

## 2024-08-30 ENCOUNTER — Encounter (HOSPITAL_COMMUNITY)

## 2024-09-02 ENCOUNTER — Encounter (HOSPITAL_COMMUNITY)

## 2024-09-04 ENCOUNTER — Encounter (HOSPITAL_COMMUNITY)

## 2024-09-06 ENCOUNTER — Encounter (HOSPITAL_COMMUNITY)

## 2024-09-09 ENCOUNTER — Encounter (HOSPITAL_COMMUNITY)

## 2024-09-11 ENCOUNTER — Encounter (HOSPITAL_COMMUNITY)

## 2024-09-13 ENCOUNTER — Encounter (HOSPITAL_COMMUNITY)

## 2024-09-16 ENCOUNTER — Encounter (HOSPITAL_COMMUNITY)

## 2024-09-18 ENCOUNTER — Encounter (HOSPITAL_COMMUNITY)

## 2024-10-23 ENCOUNTER — Ambulatory Visit: Admitting: Cardiology
